# Patient Record
Sex: Female | Born: 1935 | Race: White | Hispanic: No | Smoking: Former smoker
Health system: Southern US, Community
[De-identification: ages and names within clinical notes are randomized; demographics above are authoritative.]

## PROBLEM LIST (undated history)

## (undated) DIAGNOSIS — Z9289 Personal history of other medical treatment: Secondary | ICD-10-CM

## (undated) DIAGNOSIS — I4891 Unspecified atrial fibrillation: Secondary | ICD-10-CM

## (undated) DIAGNOSIS — E538 Deficiency of other specified B group vitamins: Secondary | ICD-10-CM

## (undated) DIAGNOSIS — M810 Age-related osteoporosis without current pathological fracture: Secondary | ICD-10-CM

## (undated) DIAGNOSIS — I739 Peripheral vascular disease, unspecified: Secondary | ICD-10-CM

## (undated) DIAGNOSIS — H269 Unspecified cataract: Secondary | ICD-10-CM

## (undated) DIAGNOSIS — K649 Unspecified hemorrhoids: Secondary | ICD-10-CM

## (undated) DIAGNOSIS — I10 Essential (primary) hypertension: Secondary | ICD-10-CM

## (undated) DIAGNOSIS — I5022 Chronic systolic (congestive) heart failure: Secondary | ICD-10-CM

## (undated) DIAGNOSIS — I340 Nonrheumatic mitral (valve) insufficiency: Secondary | ICD-10-CM

## (undated) DIAGNOSIS — I255 Ischemic cardiomyopathy: Secondary | ICD-10-CM

## (undated) DIAGNOSIS — I509 Heart failure, unspecified: Secondary | ICD-10-CM

## (undated) DIAGNOSIS — Q2730 Arteriovenous malformation, site unspecified: Secondary | ICD-10-CM

## (undated) DIAGNOSIS — I2510 Atherosclerotic heart disease of native coronary artery without angina pectoris: Secondary | ICD-10-CM

## (undated) DIAGNOSIS — E785 Hyperlipidemia, unspecified: Secondary | ICD-10-CM

## (undated) DIAGNOSIS — E119 Type 2 diabetes mellitus without complications: Secondary | ICD-10-CM

## (undated) DIAGNOSIS — F419 Anxiety disorder, unspecified: Secondary | ICD-10-CM

## (undated) HISTORY — PX: HEMORRHOID SURGERY: SHX153

## (undated) HISTORY — DX: Unspecified cataract: H26.9

## (undated) HISTORY — DX: Personal history of other medical treatment: Z92.89

## (undated) HISTORY — DX: Unspecified hemorrhoids: K64.9

## (undated) HISTORY — DX: Heart failure, unspecified: I50.9

## (undated) HISTORY — PX: ABDOMINAL HYSTERECTOMY: SHX81

## (undated) HISTORY — DX: Nonrheumatic mitral (valve) insufficiency: I34.0

## (undated) HISTORY — DX: Chronic systolic (congestive) heart failure: I50.22

## (undated) HISTORY — DX: Arteriovenous malformation, site unspecified: Q27.30

## (undated) HISTORY — PX: APPENDECTOMY: SHX54

## (undated) HISTORY — DX: Atherosclerotic heart disease of native coronary artery without angina pectoris: I25.10

## (undated) HISTORY — DX: Age-related osteoporosis without current pathological fracture: M81.0

## (undated) HISTORY — DX: Anxiety disorder, unspecified: F41.9

## (undated) HISTORY — PX: CORONARY ARTERY BYPASS GRAFT: SHX141

## (undated) HISTORY — DX: Ischemic cardiomyopathy: I25.5

## (undated) HISTORY — DX: Hyperlipidemia, unspecified: E78.5

## (undated) HISTORY — DX: Essential (primary) hypertension: I10

## (undated) HISTORY — DX: Unspecified atrial fibrillation: I48.91

## (undated) HISTORY — DX: Peripheral vascular disease, unspecified: I73.9

## (undated) HISTORY — DX: Deficiency of other specified B group vitamins: E53.8

## (undated) HISTORY — DX: Type 2 diabetes mellitus without complications: E11.9

---

## 2001-01-15 ENCOUNTER — Ambulatory Visit (HOSPITAL_COMMUNITY): Admission: RE | Admit: 2001-01-15 | Discharge: 2001-01-15 | Payer: Self-pay | Admitting: *Deleted

## 2002-12-28 ENCOUNTER — Encounter: Payer: Self-pay | Admitting: Endocrinology

## 2002-12-28 ENCOUNTER — Encounter: Admission: RE | Admit: 2002-12-28 | Discharge: 2002-12-28 | Payer: Self-pay | Admitting: Endocrinology

## 2003-01-13 ENCOUNTER — Encounter: Admission: RE | Admit: 2003-01-13 | Discharge: 2003-01-13 | Payer: Self-pay | Admitting: Endocrinology

## 2003-01-13 ENCOUNTER — Encounter: Payer: Self-pay | Admitting: Endocrinology

## 2003-01-26 ENCOUNTER — Encounter (INDEPENDENT_AMBULATORY_CARE_PROVIDER_SITE_OTHER): Payer: Self-pay | Admitting: Specialist

## 2003-01-26 ENCOUNTER — Ambulatory Visit (HOSPITAL_COMMUNITY): Admission: RE | Admit: 2003-01-26 | Discharge: 2003-01-26 | Payer: Self-pay | Admitting: *Deleted

## 2003-02-02 LAB — HM COLONOSCOPY: HM Colonoscopy: NORMAL

## 2003-03-03 ENCOUNTER — Ambulatory Visit (HOSPITAL_COMMUNITY): Admission: RE | Admit: 2003-03-03 | Discharge: 2003-03-03 | Payer: Self-pay | Admitting: *Deleted

## 2006-02-05 ENCOUNTER — Ambulatory Visit: Payer: Self-pay | Admitting: Family Medicine

## 2006-02-19 ENCOUNTER — Ambulatory Visit: Payer: Self-pay | Admitting: Family Medicine

## 2006-03-13 ENCOUNTER — Ambulatory Visit: Payer: Self-pay | Admitting: Internal Medicine

## 2006-03-27 ENCOUNTER — Ambulatory Visit: Payer: Self-pay | Admitting: Internal Medicine

## 2006-03-27 LAB — CONVERTED CEMR LAB
BUN: 17 mg/dL (ref 6–23)
Basophils Relative: 0.7 % (ref 0.0–1.0)
Creatinine, Ser: 1.1 mg/dL (ref 0.4–1.2)
Hemoglobin: 12.6 g/dL (ref 12.0–15.0)
Lymphocytes Relative: 20.7 % (ref 12.0–46.0)
MCHC: 33.6 g/dL (ref 30.0–36.0)
Monocytes Absolute: 0.7 10*3/uL (ref 0.2–0.7)
Monocytes Relative: 11.8 % — ABNORMAL HIGH (ref 3.0–11.0)
Neutro Abs: 4 10*3/uL (ref 1.4–7.7)

## 2006-05-16 ENCOUNTER — Ambulatory Visit: Payer: Self-pay | Admitting: Family Medicine

## 2007-01-08 ENCOUNTER — Encounter: Payer: Self-pay | Admitting: Internal Medicine

## 2007-02-02 ENCOUNTER — Ambulatory Visit: Payer: Self-pay | Admitting: Family Medicine

## 2007-02-02 DIAGNOSIS — E8941 Symptomatic postprocedural ovarian failure: Secondary | ICD-10-CM | POA: Insufficient documentation

## 2007-02-02 DIAGNOSIS — I10 Essential (primary) hypertension: Secondary | ICD-10-CM | POA: Insufficient documentation

## 2007-02-04 ENCOUNTER — Telehealth (INDEPENDENT_AMBULATORY_CARE_PROVIDER_SITE_OTHER): Payer: Self-pay | Admitting: *Deleted

## 2007-02-05 ENCOUNTER — Encounter: Payer: Self-pay | Admitting: Family Medicine

## 2007-02-09 ENCOUNTER — Ambulatory Visit: Payer: Self-pay | Admitting: Family Medicine

## 2007-02-10 ENCOUNTER — Encounter (INDEPENDENT_AMBULATORY_CARE_PROVIDER_SITE_OTHER): Payer: Self-pay | Admitting: *Deleted

## 2007-02-24 ENCOUNTER — Encounter: Payer: Self-pay | Admitting: Family Medicine

## 2007-03-05 ENCOUNTER — Encounter: Payer: Self-pay | Admitting: Family Medicine

## 2007-03-19 ENCOUNTER — Encounter (INDEPENDENT_AMBULATORY_CARE_PROVIDER_SITE_OTHER): Payer: Self-pay | Admitting: *Deleted

## 2007-04-01 ENCOUNTER — Telehealth (INDEPENDENT_AMBULATORY_CARE_PROVIDER_SITE_OTHER): Payer: Self-pay | Admitting: *Deleted

## 2007-04-13 ENCOUNTER — Telehealth (INDEPENDENT_AMBULATORY_CARE_PROVIDER_SITE_OTHER): Payer: Self-pay | Admitting: *Deleted

## 2007-05-14 ENCOUNTER — Encounter: Payer: Self-pay | Admitting: Family Medicine

## 2007-05-15 ENCOUNTER — Ambulatory Visit: Payer: Self-pay | Admitting: Family Medicine

## 2007-05-18 ENCOUNTER — Encounter (INDEPENDENT_AMBULATORY_CARE_PROVIDER_SITE_OTHER): Payer: Self-pay | Admitting: *Deleted

## 2007-05-18 DIAGNOSIS — Z8639 Personal history of other endocrine, nutritional and metabolic disease: Secondary | ICD-10-CM

## 2007-05-18 DIAGNOSIS — E785 Hyperlipidemia, unspecified: Secondary | ICD-10-CM

## 2007-05-18 DIAGNOSIS — Z862 Personal history of diseases of the blood and blood-forming organs and certain disorders involving the immune mechanism: Secondary | ICD-10-CM

## 2007-05-18 LAB — CONVERTED CEMR LAB
ALT: 22 units/L (ref 0–35)
Albumin: 3.8 g/dL (ref 3.5–5.2)
Alkaline Phosphatase: 52 units/L (ref 39–117)
BUN: 12 mg/dL (ref 6–23)
CO2: 27 meq/L (ref 19–32)
Calcium: 9 mg/dL (ref 8.4–10.5)
Creatinine, Ser: 1.2 mg/dL (ref 0.4–1.2)
Direct LDL: 133.1 mg/dL
GFR calc Af Amer: 56 mL/min
Total Bilirubin: 1.6 mg/dL — ABNORMAL HIGH (ref 0.3–1.2)
Total Protein: 6.8 g/dL (ref 6.0–8.3)
VLDL: 29 mg/dL (ref 0–40)

## 2007-06-23 ENCOUNTER — Encounter: Admission: RE | Admit: 2007-06-23 | Discharge: 2007-06-23 | Payer: Self-pay | Admitting: Family Medicine

## 2007-06-23 ENCOUNTER — Encounter: Payer: Self-pay | Admitting: Family Medicine

## 2007-11-10 ENCOUNTER — Telehealth (INDEPENDENT_AMBULATORY_CARE_PROVIDER_SITE_OTHER): Payer: Self-pay | Admitting: *Deleted

## 2008-03-10 ENCOUNTER — Ambulatory Visit: Payer: Self-pay | Admitting: Family Medicine

## 2008-03-11 ENCOUNTER — Encounter: Payer: Self-pay | Admitting: Family Medicine

## 2008-03-14 ENCOUNTER — Encounter (INDEPENDENT_AMBULATORY_CARE_PROVIDER_SITE_OTHER): Payer: Self-pay | Admitting: *Deleted

## 2008-03-17 ENCOUNTER — Encounter: Payer: Self-pay | Admitting: Family Medicine

## 2008-03-24 ENCOUNTER — Ambulatory Visit: Payer: Self-pay | Admitting: Family Medicine

## 2008-03-24 LAB — CONVERTED CEMR LAB
Bilirubin Urine: NEGATIVE
Glucose, Urine, Semiquant: NEGATIVE
Protein, U semiquant: NEGATIVE

## 2008-03-25 ENCOUNTER — Encounter: Payer: Self-pay | Admitting: Family Medicine

## 2008-03-28 ENCOUNTER — Encounter (INDEPENDENT_AMBULATORY_CARE_PROVIDER_SITE_OTHER): Payer: Self-pay | Admitting: *Deleted

## 2008-03-28 LAB — CONVERTED CEMR LAB
Bilirubin, Direct: 0.1 mg/dL (ref 0.0–0.3)
Calcium: 9.3 mg/dL (ref 8.4–10.5)
Direct LDL: 132.4 mg/dL
GFR calc Af Amer: 69 mL/min
HDL: 41.2 mg/dL (ref >39.0)
Sodium: 142 meq/L (ref 135–145)
Total Bilirubin: 1.1 mg/dL (ref 0.3–1.2)
Total CHOL/HDL Ratio: 5.1
Total Protein: 7.3 g/dL (ref 6.0–8.3)
Triglycerides: 140 mg/dL (ref 0–149)
VLDL: 28 mg/dL (ref 0–40)

## 2008-03-31 ENCOUNTER — Telehealth (INDEPENDENT_AMBULATORY_CARE_PROVIDER_SITE_OTHER): Payer: Self-pay | Admitting: *Deleted

## 2008-04-01 ENCOUNTER — Ambulatory Visit: Payer: Self-pay | Admitting: Family Medicine

## 2008-04-01 DIAGNOSIS — R300 Dysuria: Secondary | ICD-10-CM

## 2008-04-01 LAB — CONVERTED CEMR LAB
Blood in Urine, dipstick: NEGATIVE
Ketones, urine, test strip: NEGATIVE
Nitrite: NEGATIVE
Specific Gravity, Urine: 1.025
Urobilinogen, UA: NEGATIVE

## 2008-04-04 ENCOUNTER — Encounter (INDEPENDENT_AMBULATORY_CARE_PROVIDER_SITE_OTHER): Payer: Self-pay | Admitting: *Deleted

## 2008-04-07 ENCOUNTER — Encounter: Payer: Self-pay | Admitting: Family Medicine

## 2009-03-13 ENCOUNTER — Telehealth: Payer: Self-pay | Admitting: Family Medicine

## 2009-03-13 ENCOUNTER — Ambulatory Visit: Payer: Self-pay | Admitting: Family Medicine

## 2009-03-13 DIAGNOSIS — E1151 Type 2 diabetes mellitus with diabetic peripheral angiopathy without gangrene: Secondary | ICD-10-CM

## 2009-03-13 DIAGNOSIS — E1165 Type 2 diabetes mellitus with hyperglycemia: Secondary | ICD-10-CM

## 2009-03-13 DIAGNOSIS — F411 Generalized anxiety disorder: Secondary | ICD-10-CM | POA: Insufficient documentation

## 2009-03-14 ENCOUNTER — Telehealth (INDEPENDENT_AMBULATORY_CARE_PROVIDER_SITE_OTHER): Payer: Self-pay | Admitting: *Deleted

## 2009-03-14 ENCOUNTER — Telehealth: Payer: Self-pay | Admitting: Family Medicine

## 2009-03-19 ENCOUNTER — Emergency Department (HOSPITAL_COMMUNITY): Admission: EM | Admit: 2009-03-19 | Discharge: 2009-03-19 | Payer: Self-pay | Admitting: Emergency Medicine

## 2009-03-19 ENCOUNTER — Encounter: Payer: Self-pay | Admitting: Family Medicine

## 2009-03-20 ENCOUNTER — Encounter: Payer: Self-pay | Admitting: Family Medicine

## 2009-03-21 ENCOUNTER — Ambulatory Visit: Payer: Self-pay | Admitting: Family Medicine

## 2009-03-21 ENCOUNTER — Telehealth: Payer: Self-pay | Admitting: Family Medicine

## 2009-03-21 LAB — CONVERTED CEMR LAB
OCCULT 2: NEGATIVE
OCCULT 3: NEGATIVE

## 2009-03-28 ENCOUNTER — Ambulatory Visit: Payer: Self-pay | Admitting: Family Medicine

## 2009-03-28 DIAGNOSIS — R109 Unspecified abdominal pain: Secondary | ICD-10-CM

## 2009-03-28 LAB — CONVERTED CEMR LAB
Bilirubin Urine: NEGATIVE
Glucose, Urine, Semiquant: NEGATIVE
Ketones, urine, test strip: NEGATIVE
Specific Gravity, Urine: <1.005

## 2009-03-29 ENCOUNTER — Encounter: Payer: Self-pay | Admitting: Family Medicine

## 2009-03-29 ENCOUNTER — Telehealth (INDEPENDENT_AMBULATORY_CARE_PROVIDER_SITE_OTHER): Payer: Self-pay | Admitting: *Deleted

## 2009-03-29 ENCOUNTER — Ambulatory Visit: Payer: Self-pay | Admitting: Cardiology

## 2009-03-30 ENCOUNTER — Telehealth (INDEPENDENT_AMBULATORY_CARE_PROVIDER_SITE_OTHER): Payer: Self-pay | Admitting: *Deleted

## 2009-03-30 ENCOUNTER — Telehealth: Payer: Self-pay | Admitting: Family Medicine

## 2009-03-31 ENCOUNTER — Encounter (INDEPENDENT_AMBULATORY_CARE_PROVIDER_SITE_OTHER): Payer: Self-pay | Admitting: *Deleted

## 2009-04-03 ENCOUNTER — Encounter (INDEPENDENT_AMBULATORY_CARE_PROVIDER_SITE_OTHER): Payer: Self-pay | Admitting: *Deleted

## 2009-04-14 ENCOUNTER — Ambulatory Visit: Payer: Self-pay | Admitting: Internal Medicine

## 2009-04-14 DIAGNOSIS — R198 Other specified symptoms and signs involving the digestive system and abdomen: Secondary | ICD-10-CM

## 2009-04-14 DIAGNOSIS — I739 Peripheral vascular disease, unspecified: Secondary | ICD-10-CM

## 2009-04-14 DIAGNOSIS — Q2733 Arteriovenous malformation of digestive system vessel: Secondary | ICD-10-CM

## 2009-04-25 ENCOUNTER — Encounter: Payer: Self-pay | Admitting: Family Medicine

## 2009-04-26 ENCOUNTER — Encounter: Payer: Self-pay | Admitting: Family Medicine

## 2009-04-26 ENCOUNTER — Ambulatory Visit: Payer: Self-pay | Admitting: Vascular Surgery

## 2009-04-27 ENCOUNTER — Telehealth: Payer: Self-pay | Admitting: Family Medicine

## 2009-04-27 ENCOUNTER — Encounter: Payer: Self-pay | Admitting: Family Medicine

## 2009-04-28 ENCOUNTER — Telehealth (INDEPENDENT_AMBULATORY_CARE_PROVIDER_SITE_OTHER): Payer: Self-pay | Admitting: *Deleted

## 2009-05-15 ENCOUNTER — Telehealth: Payer: Self-pay | Admitting: Family Medicine

## 2009-06-28 ENCOUNTER — Encounter: Payer: Self-pay | Admitting: Family Medicine

## 2009-06-28 ENCOUNTER — Telehealth: Payer: Self-pay | Admitting: Family Medicine

## 2009-07-05 ENCOUNTER — Ambulatory Visit: Payer: Self-pay | Admitting: Family Medicine

## 2009-07-10 LAB — CONVERTED CEMR LAB
AST: 26 units/L (ref 0–37)
Albumin: 4 g/dL (ref 3.5–5.2)
Alkaline Phosphatase: 62 units/L (ref 39–117)
BUN: 18 mg/dL (ref 6–23)
Bilirubin, Direct: 0.2 mg/dL (ref 0.0–0.3)
CO2: 29 meq/L (ref 19–32)
Calcium: 9.6 mg/dL (ref 8.4–10.5)
Cholesterol: 108 mg/dL (ref 0–200)
Creatinine, Ser: 1 mg/dL (ref 0.4–1.2)
Hgb A1c MFr Bld: 6 % (ref 4.6–6.5)
Microalb, Ur: 0.5 mg/dL (ref 0.0–1.9)
Total Protein: 6.9 g/dL (ref 6.0–8.3)
Triglycerides: 58 mg/dL (ref 0.0–149.0)

## 2009-11-01 ENCOUNTER — Ambulatory Visit: Payer: Self-pay | Admitting: Vascular Surgery

## 2009-11-15 ENCOUNTER — Telehealth (INDEPENDENT_AMBULATORY_CARE_PROVIDER_SITE_OTHER): Payer: Self-pay | Admitting: *Deleted

## 2009-12-04 ENCOUNTER — Ambulatory Visit: Payer: Self-pay | Admitting: Family Medicine

## 2009-12-18 ENCOUNTER — Encounter: Payer: Self-pay | Admitting: Family Medicine

## 2010-01-02 ENCOUNTER — Ambulatory Visit: Payer: Self-pay | Admitting: Family Medicine

## 2010-01-02 DIAGNOSIS — M549 Dorsalgia, unspecified: Secondary | ICD-10-CM | POA: Insufficient documentation

## 2010-01-02 LAB — CONVERTED CEMR LAB
Bilirubin Urine: NEGATIVE
Blood in Urine, dipstick: NEGATIVE
Protein, U semiquant: NEGATIVE
Specific Gravity, Urine: 1.02
Urobilinogen, UA: 0.2

## 2010-01-08 LAB — CONVERTED CEMR LAB
Alkaline Phosphatase: 50 units/L (ref 39–117)
Bilirubin, Direct: 0.1 mg/dL (ref 0.0–0.3)
CO2: 26 meq/L (ref 19–32)
Calcium: 9.5 mg/dL (ref 8.4–10.5)
Creatinine, Ser: 1 mg/dL (ref 0.4–1.2)
Creatinine,U: 80.9 mg/dL
GFR calc non Af Amer: 58.34 mL/min (ref >60)
HDL: 47.5 mg/dL (ref >39.00)
Hgb A1c MFr Bld: 6 % (ref 4.6–6.5)
LDL Cholesterol: 78 mg/dL (ref 0–99)
Microalb Creat Ratio: 0.7 mg/g (ref 0.0–30.0)
Sodium: 145 meq/L (ref 135–145)
Total Bilirubin: 0.8 mg/dL (ref 0.3–1.2)
Total CHOL/HDL Ratio: 3
Total Protein: 6.6 g/dL (ref 6.0–8.3)
Triglycerides: 76 mg/dL (ref 0.0–149.0)

## 2010-01-12 ENCOUNTER — Telehealth (INDEPENDENT_AMBULATORY_CARE_PROVIDER_SITE_OTHER): Payer: Self-pay | Admitting: *Deleted

## 2010-01-17 ENCOUNTER — Telehealth: Payer: Self-pay | Admitting: Family Medicine

## 2010-03-16 ENCOUNTER — Encounter: Payer: Self-pay | Admitting: Family Medicine

## 2010-03-16 ENCOUNTER — Ambulatory Visit: Payer: Self-pay | Admitting: Family Medicine

## 2010-03-16 DIAGNOSIS — M810 Age-related osteoporosis without current pathological fracture: Secondary | ICD-10-CM | POA: Insufficient documentation

## 2010-03-16 LAB — CONVERTED CEMR LAB
Bilirubin Urine: NEGATIVE
Glucose, Urine, Semiquant: NEGATIVE
Ketones, urine, test strip: NEGATIVE
Specific Gravity, Urine: 1.02
Urobilinogen, UA: NEGATIVE

## 2010-03-20 ENCOUNTER — Ambulatory Visit: Payer: Self-pay | Admitting: Family Medicine

## 2010-03-20 ENCOUNTER — Encounter (INDEPENDENT_AMBULATORY_CARE_PROVIDER_SITE_OTHER): Payer: Self-pay | Admitting: *Deleted

## 2010-03-21 ENCOUNTER — Encounter: Payer: Self-pay | Admitting: Family Medicine

## 2010-03-21 ENCOUNTER — Telehealth (INDEPENDENT_AMBULATORY_CARE_PROVIDER_SITE_OTHER): Payer: Self-pay | Admitting: *Deleted

## 2010-03-21 LAB — CONVERTED CEMR LAB
ALT: 16 units/L (ref 0–35)
BUN: 19 mg/dL (ref 6–23)
Basophils Relative: 0.6 % (ref 0.0–3.0)
CO2: 27 meq/L (ref 19–32)
Chloride: 110 meq/L (ref 96–112)
Cholesterol: 135 mg/dL (ref 0–200)
Creatinine, Ser: 0.9 mg/dL (ref 0.4–1.2)
Creatinine,U: 121.4 mg/dL
Eosinophils Absolute: 0.2 10*3/uL (ref 0.0–0.7)
Eosinophils Relative: 2.7 % (ref 0.0–5.0)
HCT: 34.1 % — ABNORMAL LOW (ref 36.0–46.0)
Hgb A1c MFr Bld: 5.6 % (ref 4.6–6.5)
LDL Cholesterol: 70 mg/dL (ref 0–99)
Lymphs Abs: 2.6 10*3/uL (ref 0.7–4.0)
MCHC: 34.7 g/dL (ref 30.0–36.0)
MCV: 99.6 fL (ref 78.0–100.0)
Microalb, Ur: 2.2 mg/dL — ABNORMAL HIGH (ref 0.0–1.9)
Monocytes Absolute: 0.7 10*3/uL (ref 0.1–1.0)
Platelets: 164 10*3/uL (ref 150.0–400.0)
Potassium: 4.4 meq/L (ref 3.5–5.1)
RBC: 3.43 M/uL — ABNORMAL LOW (ref 3.87–5.11)
Total Protein: 6.7 g/dL (ref 6.0–8.3)
Triglycerides: 73 mg/dL (ref 0.0–149.0)
WBC: 6.3 10*3/uL (ref 4.5–10.5)

## 2010-03-23 ENCOUNTER — Telehealth (INDEPENDENT_AMBULATORY_CARE_PROVIDER_SITE_OTHER): Payer: Self-pay | Admitting: *Deleted

## 2010-03-26 ENCOUNTER — Encounter: Payer: Self-pay | Admitting: Family Medicine

## 2010-03-27 ENCOUNTER — Encounter: Payer: Self-pay | Admitting: Family Medicine

## 2010-04-30 ENCOUNTER — Encounter: Payer: Self-pay | Admitting: Family Medicine

## 2010-05-10 ENCOUNTER — Encounter: Payer: Self-pay | Admitting: Family Medicine

## 2010-05-10 ENCOUNTER — Ambulatory Visit: Payer: Self-pay | Admitting: Vascular Surgery

## 2010-06-06 ENCOUNTER — Telehealth (INDEPENDENT_AMBULATORY_CARE_PROVIDER_SITE_OTHER): Payer: Self-pay | Admitting: *Deleted

## 2010-06-21 ENCOUNTER — Ambulatory Visit
Admission: RE | Admit: 2010-06-21 | Discharge: 2010-06-21 | Payer: Self-pay | Source: Home / Self Care | Attending: Family Medicine | Admitting: Family Medicine

## 2010-06-21 ENCOUNTER — Other Ambulatory Visit: Payer: Self-pay | Admitting: Family Medicine

## 2010-06-21 DIAGNOSIS — E538 Deficiency of other specified B group vitamins: Secondary | ICD-10-CM | POA: Insufficient documentation

## 2010-06-21 LAB — CBC WITH DIFFERENTIAL/PLATELET
Basophils Relative: 0.6 % (ref 0.0–3.0)
Eosinophils Relative: 6.9 % — ABNORMAL HIGH (ref 0.0–5.0)
Hemoglobin: 11.6 g/dL — ABNORMAL LOW (ref 12.0–15.0)
Lymphocytes Relative: 39.4 % (ref 12.0–46.0)
Monocytes Relative: 12.4 % — ABNORMAL HIGH (ref 3.0–12.0)
Neutro Abs: 2.6 10*3/uL (ref 1.4–7.7)
RBC: 3.37 Mil/uL — ABNORMAL LOW (ref 3.87–5.11)

## 2010-06-21 LAB — LIPID PANEL
Cholesterol: 127 mg/dL (ref 0–200)
HDL: 49 mg/dL (ref >39.00)
LDL Cholesterol: 70 mg/dL (ref 0–99)
Triglycerides: 42 mg/dL (ref 0.0–149.0)
VLDL: 8.4 mg/dL (ref 0.0–40.0)

## 2010-06-21 LAB — VITAMIN B12: Vitamin B-12: 42 pg/mL — ABNORMAL LOW (ref 211–911)

## 2010-06-21 LAB — BASIC METABOLIC PANEL
BUN: 19 mg/dL (ref 6–23)
Calcium: 9.3 mg/dL (ref 8.4–10.5)
Creatinine, Ser: 0.9 mg/dL (ref 0.4–1.2)
GFR: 64.29 mL/min (ref >60.00)

## 2010-06-21 LAB — HEPATIC FUNCTION PANEL
ALT: 15 U/L (ref 0–35)
Bilirubin, Direct: 0.2 mg/dL (ref 0.0–0.3)
Total Bilirubin: 1 mg/dL (ref 0.3–1.2)

## 2010-06-21 LAB — FERRITIN: Ferritin: 23.3 ng/mL (ref 10.0–291.0)

## 2010-06-24 LAB — CONVERTED CEMR LAB
ALT: 19 units/L (ref 0–35)
AST: 20 units/L (ref 0–37)
AST: 22 units/L (ref 0–37)
Alkaline Phosphatase: 51 units/L (ref 39–117)
Basophils Absolute: 0 10*3/uL (ref 0.0–0.1)
Basophils Relative: 0.4 % (ref 0.0–1.0)
Bilirubin Urine: NEGATIVE
Bilirubin, Direct: 0.1 mg/dL (ref 0.0–0.3)
Bilirubin, Direct: 0.1 mg/dL (ref 0.0–0.3)
Blood in Urine, dipstick: NEGATIVE
CO2: 28 meq/L (ref 19–32)
Calcium: 9.2 mg/dL (ref 8.4–10.5)
Chloride: 110 meq/L (ref 96–112)
Cholesterol, target level: 200 mg/dL
Creatinine, Ser: 1 mg/dL (ref 0.4–1.2)
Direct LDL: 155.5 mg/dL
Eosinophils Relative: 2.5 % (ref 0.0–5.0)
GFR calc non Af Amer: 64.5 mL/min (ref >60)
Glucose, Bld: 123 mg/dL — ABNORMAL HIGH (ref 70–99)
Glucose, Bld: 143 mg/dL — ABNORMAL HIGH (ref 70–99)
Glucose, Urine, Semiquant: NEGATIVE
HCT: 38.1 % (ref 36.0–46.0)
HDL goal, serum: 40 mg/dL
HDL: 43.2 mg/dL (ref >39.00)
Hemoglobin: 13.4 g/dL (ref 12.0–15.0)
Lymphocytes Relative: 34.5 % (ref 12.0–46.0)
MCV: 96.8 fL (ref 78.0–100.0)
Monocytes Relative: 11.9 % (ref 3.0–12.0)
Neutro Abs: 3 10*3/uL (ref 1.4–7.7)
Neutrophils Relative %: 49.8 % (ref 43.0–77.0)
Neutrophils Relative %: 57.1 % (ref 43.0–77.0)
Nitrite: NEGATIVE
Protein, U semiquant: >=300
RBC: 3.94 M/uL (ref 3.87–5.11)
RDW: 12.2 % (ref 11.5–14.6)
RDW: 12.3 % (ref 11.5–14.6)
Sodium: 144 meq/L (ref 135–145)
Sodium: 145 meq/L (ref 135–145)
Specific Gravity, Urine: 1.02
Total Bilirubin: 1 mg/dL (ref 0.3–1.2)
Total Bilirubin: 1.3 mg/dL — ABNORMAL HIGH (ref 0.3–1.2)
Total CHOL/HDL Ratio: 5
Total CHOL/HDL Ratio: 5
Triglycerides: 107 mg/dL (ref 0–149)
Triglycerides: 136 mg/dL (ref 0.0–149.0)
VLDL: 21 mg/dL (ref 0–40)
WBC Urine, dipstick: NEGATIVE
WBC Urine, dipstick: NEGATIVE
WBC: 6.8 10*3/uL (ref 4.5–10.5)
pH: 5

## 2010-06-26 NOTE — Progress Notes (Signed)
Summary: needs appt bp check- lmom [phone]  Phone Note Outgoing Call   Call placed by: Army Fossa CMA,  November 15, 2009 10:34 AM Summary of Call: Needs OV to check BP. Army Fossa CMA  November 15, 2009 10:35 AM   Follow-up for Phone Call        lmom to schedule appt - mailed letter .Marland KitchenOkey Regal Spring  November 15, 2009 3:58 PM

## 2010-06-26 NOTE — Progress Notes (Signed)
Summary: refill  Phone Note Refill Request Message from:  Fax from Pharmacy on January 12, 2010 8:27 AM  Refills Requested: Medication #1:  LISINOPRIL 20 MG TABS 1 by mouth once daily [facility] - fax [phone]  Initial call taken by: Okey Regal Spring,  January 12, 2010 8:28 AM  Follow-up for Phone Call        pharmacy did not receive.    Prescriptions: LISINOPRIL 20 MG TABS (LISINOPRIL) 1 by mouth once daily  #30 Tablet x 1   Entered by:   Army Fossa CMA   Authorized by:   Loreen Freud DO   Signed by:   Army Fossa CMA on 01/12/2010   Method used:   Electronically to        [facility] (retail)       [address]       [place], [place]  [address]       Ph: [phone]       Fax: [phone]   RxID:   [id number]

## 2010-06-26 NOTE — Assessment & Plan Note (Signed)
Summary: back pain generates to side/cbs   Vital Signs:  Patient profile:   77 year old female Height:      61 inches Weight:      130 pounds BMI:     24.65 Temp:     97.9 degrees F oral BP sitting:   134 / 70  (left arm)  Vitals Entered By: Doristine Devoid CMA (January 02, 2010 9:15 AM) CC: back pain x1 wk    History of Present Illness: 77 yo woman here today for back pain.  sxs on R lower back, radiating around the front into the groin/hip.  pain w/ twisting.  sxs started 5 days ago.  pt fears it's her kidney- has been drinking lots of water and cranberry juice.  the week prior to pain took down all her curtains and washed, pressed, and re-hung them.  no numbness or weakness in the legs.  no dysuria, incontinence of bowel or bladder.  Problems Prior to Update: 1)  Back Pain  (ICD-724.5) 2)  Arteriovenous Malformation, Colon  (ICD-747.61) 3)  Pvd  (ICD-443.9) 4)  Change in Bowels  (ICD-787.99) 5)  Abdominal Pain Other Specified Site  (ICD-789.09) 6)  Diabetes Mellitus, Type II, Uncontrolled  (ICD-250.02) 7)  Anxiety State, Unspecified  (ICD-300.00) 8)  Dysuria  (ICD-788.1) 9)  Hyperlipidemia  (ICD-272.4) 10)  Glucose Intolerance, Hx of  (ICD-V12.2) 11)  Artificial Menopause  (ICD-627.4) 12)  Family History of Cervical Cancer  (ICD-V17.3) 13)  Hypertension  (ICD-401.9)  Current Medications (verified): 1)  Lisinopril 20 Mg Tabs (Lisinopril) .[name].. 1 By Mouth Once Daily 2)  Adult Aspirin Ec Low Strength 81 Mg  Tbec (Aspirin) .[name].. 1 By Mouth Once Daily 3)  Calcium 600/vitamin D 600-200 Mg-Unit  Tabs (Calcium Carbonate-Vitamin D) .[name].. 1 By Mouth Two Times A Day 4)  Mvi 5)  Fish Oil 1200 Mg Caps (Omega-3 Fatty Acids) .[name].. 1 By Mouth Once Daily 6)  Celexa 20 Mg Tabs (Citalopram Hydrobromide) .... 1/2 Tab By Mouth Daily. 7)  Januvia 100 Mg Tabs (Sitagliptin Phosphate) .[name].. 1 By Mouth Once Daily 8)  Zocor 40 Mg Tabs (Simvastatin) .[name].. 1 By Mouth Once Daily. 9)  One Touch Delica Lancets   Misc (Lancets) .... As Directed Two Times A Day 10)  Onetouch Test  Strp (Glucose Blood) .... As Directed. 11)  Ibuprofen 400 Mg Tabs (Ibuprofen) .[name].. 1 Tab By Mouth Daily Three Times A Day For 7 Days and Then As Needed.  Take W/ Food.  Allergies (verified): 1)  ! Sulfa 2)  ! Demerol  Review of Systems      See HPI  Physical Exam  General:  Well-developed,well-nourished,in no acute distress; alert,appropriate and cooperative throughout examination Msk:  + TTP over R lumbar area.  (-) SLR.  good flexion and extension.  no pain w/ internal/external rotation of R hip.  good flexion/extension. Pulses:  +2 DP/PT Extremities:  no C/C/E Neurologic:  strength normal in all extremities, sensation intact to light touch, and gait normal.     Impression & Recommendations:  Problem # 1:  BACK PAIN (ICD-724.5) Assessment New (-) UA.  pt w/ musculoskeletal pain over R lumbar spine.  start Ibuprofen and heat/ice for relief.  reviewed supportive care and red flags that should prompt return.  Pt expresses understanding and is in agreement w/ this plan. Her updated medication list for this problem includes:    Adult Aspirin Ec Low Strength 81 Mg Tbec (Aspirin) .[name]... 1 by mouth once daily  Ibuprofen 400 Mg Tabs (Ibuprofen) .[name]... 1 tab by mouth daily three times a day for 7 days and then as needed.  take w/ food.  Orders: Specimen Handling (47829) UA Dipstick w/o Micro (manual) (81002)  Complete Medication List: 1)  Lisinopril 20 Mg Tabs (Lisinopril) .[name].. 1 by mouth once daily 2)  Adult Aspirin Ec Low Strength 81 Mg Tbec (Aspirin) .[name].. 1 by mouth once daily 3)  Calcium 600/vitamin D 600-200 Mg-unit Tabs (Calcium carbonate-vitamin d) .[name].. 1 by mouth two times a day 4)  Mvi  5)  Fish Oil 1200 Mg Caps (Omega-3 fatty acids) .[name].. 1 by mouth once daily 6)  Celexa 20 Mg Tabs (Citalopram hydrobromide) .... 1/2 tab by mouth daily. 7)  Januvia 100 Mg Tabs (Sitagliptin phosphate) .[name].. 1 by mouth once  daily 8)  Zocor 40 Mg Tabs (Simvastatin) .[name].. 1 by mouth once daily. 9)  One Touch Delica Lancets Misc (Lancets) .... As directed two times a day 10)  Onetouch Test Strp (Glucose blood) .... As directed. 11)  Ibuprofen 400 Mg Tabs (Ibuprofen) .[name].. 1 tab by mouth daily three times a day for 7 days and then as needed.  take w/ food.  Other Orders: Venipuncture (56213)  Patient Instructions: 1)  This is not your kidneys- this is your lower back and hip 2)  Take the ibuprofen as directed- 1 pill three times a day for pain.  Take w/ food to avoid upset stomach 3)  Use a heating pad for pain relief 4)  If no improvement or at all worsening, please call 5)  Hang in there! Prescriptions: IBUPROFEN 400 MG TABS (IBUPROFEN) 1 tab by mouth daily three times a day for 7 days and then as needed.  take w/ food.  #60 x 0   Entered and Authorized by:   Neena Rhymes MD   Signed by:   Neena Rhymes MD on 01/02/2010   Method used:   Electronically to        [facility] (retail)       [address]       [place], [place]  [address]       Ph: [phone]       Fax: [phone]   RxID:   [id number]   Laboratory Results   Urine Tests    Routine Urinalysis   Glucose: negative   (Normal Range: Negative) Bilirubin: negative   (Normal Range: Negative) Ketone: negative   (Normal Range: Negative) Spec. Gravity: 1.020   (Normal Range: 1.003-1.035) Blood: negative   (Normal Range: Negative) pH: 6.0   (Normal Range: 5.0-8.0) Protein: negative   (Normal Range: Negative) Urobilinogen: 0.2   (Normal Range: 0-1) Nitrite: negative   (Normal Range: Negative) Leukocyte Esterace: negative   (Normal Range: Negative)

## 2010-06-26 NOTE — Progress Notes (Signed)
Summary: BP Log Brought by Patient  BP Log Brought by Patient   Imported By: Lanelle Bal 12/07/2009 13:17:17  _____________________________________________________________________  External Attachment:    Type:   Image     Comment:   External Document

## 2010-06-26 NOTE — Progress Notes (Signed)
Summary: lab result   Phone Note Outgoing Call Call back at Home Phone [phone]   Call placed by: [facility] CMA,  March 21, 2010 4:08 PM Details for Reason: Pt is slightly anemic----take mvi with iron daily-- DM and chol is well controlled---- con't meds and recheck 3 months----272.4  250.00  hgba1c, bmp, hep, lipid  285.9  cbcd, ibc, ferritin, b12 --low vita D---take vita D 3 2000u daily recheck 3 months Summary of Call: left message to call office..........................Marland KitchenFelecia Deloach CMA  March 21, 2010 4:08 PM  Discuss with patient, copy of labs mailed..............Marland KitchenFelecia Deloach CMA  March 22, 2010 9:40 AM

## 2010-06-26 NOTE — Progress Notes (Signed)
Summary: refill  Phone Note Refill Request Message from:  Fax from Pharmacy on March 23, 2010 11:36 AM  Refills Requested: Medication #1:  ONETOUCH TEST  STRP as directed. [facility] [address] - fax [phone]  Initial call taken by: Okey Regal Spring,  March 23, 2010 11:37 AM    duplicate request.... Almeta Monas CMA Duncan Dull)  March 23, 2010 11:49 AM

## 2010-06-26 NOTE — Assessment & Plan Note (Signed)
Summary: yearly check and fasting labs/cbs   Vital Signs:  Patient profile:   77 year old female Menstrual status:  hysterectomy Height:      60.5 inches Weight:      130.0 pounds BMI:     25.06 Temp:     98.7 degrees F oral Pulse rate:   88 / minute Pulse rhythm:   regular BP sitting:   136 / 80  (right arm) Cuff size:   regular  Vitals Entered By: Almeta Monas CMA Duncan Dull) (March 16, 2010 8:32 AM) CC: cpx/fasting  Does patient need assistance? Functional Status Self care, Cook/clean, Shopping, Social activities Ambulation Normal Comments pt is able to do adls on her own.  Pt can read and write.  Vision Screening:      Vision Comments: vision somewhat corrected with glasses---L cataract 40db HL: Left  Right  Audiometry Comment: grossly normal --6 ft whisper      Menstrual Status hysterectomy   History of Present Illness: Pt here for cpe and labs.  No pap.  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  Pt also seeing Dr Cathren Harsh,  optho--Dr Sherryle Lis  Type 1 diabetes mellitus follow-up      This is a 77 year old woman who presents with Type 2 diabetes mellitus follow-up.  The patient denies polyuria, polydipsia, blurred vision, self managed hypoglycemia, hypoglycemia requiring help, weight loss, weight gain, and numbness of extremities.  The patient denies the following symptoms: neuropathic pain, chest pain, vomiting, orthostatic symptoms, poor wound healing, intermittent claudication, vision loss, and foot ulcer.  Since the last visit the patient reports good dietary compliance, compliance with medications, exercising regularly, and monitoring blood glucose.  The patient has been measuring capillary blood glucose before breakfast and before dinner.  Since the last visit, the patient reports having had eye care by an ophthalmologist and no foot care.    Hyperlipidemia follow-up      The patient also presents for Hyperlipidemia follow-up.  The patient denies  muscle aches, GI upset, abdominal pain, flushing, itching, constipation, diarrhea, and fatigue.  The patient denies the following symptoms: chest pain/pressure, exercise intolerance, dypsnea, palpitations, syncope, and pedal edema.  Compliance with medications (by patient report) has been near 100%.  Dietary compliance has been good.  The patient reports exercising occasionally.  Adjunctive measures currently used by the patient include ASA.    Hypertension follow-up      The patient also presents for Hypertension follow-up.  The patient denies lightheadedness, urinary frequency, headaches, edema, impotence, rash, and fatigue.  The patient denies the following associated symptoms: chest pain, chest pressure, exercise intolerance, dyspnea, palpitations, syncope, leg edema, and pedal edema.  Compliance with medications (by patient report) has been near 100%.  The patient reports that dietary compliance has been good.  The patient reports exercising occasionally.  Adjunctive measures currently used by the patient include salt restriction.    Preventive Screening-Counseling & Management  Alcohol-Tobacco     Alcohol drinks/day: 0     Smoking Status: quit     Year Quit: 1986     Pack years: 30     Passive Smoke Exposure: no  Caffeine-Diet-Exercise     Caffeine use/day: 0     Does Patient Exercise: no  Hep-HIV-STD-Contraception     HIV Risk: no     Dental Visit-last 6 months no     Dental Care Counseling: pt has false teeth     SBE monthly: no     SBE Education/Counseling:  to perform regular SBE     Sun Exposure-Excessive: occasionally  Safety-Violence-Falls     Seat Belt Use: yes     Firearms in the Home: firearms in the home     Firearm Counseling: not indicated; uses recommended firearm safety measures     Smoke Detectors: yes     Smoke Detector Counseling: n/a     Violence in the Home: no risk noted     Violence Counseling: not applicable     Sexual Abuse: no     Sexual Abuse  Counseling: n/a     Fall Risk: no      Sexual History:  widow.    Current Medications (verified): 1)  Lisinopril 20 Mg Tabs (Lisinopril) .[name].. 1 By Mouth Once Daily 2)  Adult Aspirin Ec Low Strength 81 Mg  Tbec (Aspirin) .[name].. 1 By Mouth Once Daily 3)  Calcium 600/vitamin D 600-200 Mg-Unit  Tabs (Calcium Carbonate-Vitamin D) .[name].. 1 By Mouth Two Times A Day 4)  Mvi 5)  Fish Oil 1200 Mg Caps (Omega-3 Fatty Acids) .[name].. 1 By Mouth Once Daily 6)  Januvia 100 Mg Tabs (Sitagliptin Phosphate) .[name].. 1 By Mouth Once Daily 7)  Zocor 40 Mg Tabs (Simvastatin) .[name].. 1 By Mouth Once Daily. 8)  One Touch Delica Lancets  Misc (Lancets) .... As Directed Two Times A Day 9)  Onetouch Test  Strp (Glucose Blood) .... As Directed. 10)  Ibuprofen 400 Mg Tabs (Ibuprofen) .[name].. 1 Tab By Mouth Daily Three Times A Day For 7 Days and Then As Needed.  Take W/ Food.  Allergies (verified): 1)  ! Sulfa 2)  ! Demerol  Past History:  Past Medical History: Last updated: 04/14/2009 Hypertension Hyperlipidemia GLUCOSE INTOLERANCE, HX OF (ICD-V12.2) ARTIFICIAL MENOPAUSE (ICD-627.4) FAMILY HISTORY OF CERVICAL CANCER (ICD-V17.3)  Past Surgical History: Last updated: 04/14/2009 Hysterectomy--- Appendectomy Hemorrhoidectomy Colonoscopy ? 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 Family History of  Cervical cancer-- M Family History Hypertension Family History of Stroke M 1st degree relative <50 Renal CA-- Bx3  Family History Lung cancer-- B  Social History: Reviewed history from 04/14/2009 and no changes required. 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 Eyes:  Denies blurring, discharge, double vision, eye irritation, eye pain, halos, itching, light sensitivity, red eye, vision loss-1 eye, and vision loss-both eyes. ENT:  Denies decreased hearing, difficulty swallowing, ear discharge, earache, hoarseness, nasal congestion, nosebleeds, postnasal drainage, ringing in ears, sinus pressure, and sore throat. CV:  Denies bluish discoloration of lips or nails, chest pain or discomfort, difficulty breathing at night, difficulty breathing while lying down, fainting, fatigue, leg cramps with exertion, lightheadness, near fainting, palpitations, shortness of breath with exertion, swelling of feet, swelling of hands, and weight gain.  Resp:  Denies chest discomfort, chest pain with inspiration, cough, coughing up blood, excessive snoring, hypersomnolence, morning headaches, pleuritic, shortness of breath, sputum productive, and wheezing. GI:  Denies abdominal pain, bloody stools, change in bowel habits, constipation, dark tarry stools, diarrhea, excessive appetite, gas, hemorrhoids, indigestion, loss of appetite, nausea, vomiting, vomiting blood, and yellowish skin color. GU:  Denies abnormal vaginal bleeding, decreased libido, discharge, dysuria, genital sores, hematuria, incontinence, nocturia, urinary frequency, and urinary hesitancy. MS:  Denies joint pain, joint redness, joint swelling, loss of strength, low  back pain, mid back pain, muscle aches, muscle , cramps, muscle weakness, stiffness, and thoracic pain. Derm:  Denies changes in color of skin, changes in nail beds, dryness, excessive perspiration, flushing, hair loss, insect bite(s), itching, lesion(s), poor wound healing, and rash. Neuro:  Denies brief paralysis, difficulty with concentration, disturbances in coordination, falling down, headaches, inability to speak, memory loss, numbness, poor balance, seizures, sensation of room spinning, tingling, tremors, visual disturbances, and weakness. Psych:  Denies alternate hallucination ( auditory/visual), anxiety, depression, easily angered, easily tearful, irritability, mental problems, panic attacks, sense of great danger, suicidal thoughts/plans, thoughts of violence, unusual visions or sounds, and thoughts /plans of harming others. Endo:  Denies cold intolerance, excessive hunger, excessive thirst, excessive urination, heat intolerance, polyuria, and weight change. Heme:  Denies abnormal bruising, bleeding, enlarge lymph nodes, fevers, pallor, and skin discoloration. Allergy:  Denies hives or rash, itching eyes, persistent infections, seasonal allergies, and sneezing.  Physical Exam  General:  Well-developed,well-nourished,in no acute distress; alert,appropriate and cooperative throughout examination Head:  Normocephalic and atraumatic without obvious abnormalities. No apparent alopecia or balding. Eyes:  pupils equal, pupils round, pupils reactive to light, and no injection.   Ears:  External ear exam shows no significant lesions or deformities.  Otoscopic examination reveals clear canals, tympanic membranes are intact bilaterally without bulging, retraction, inflammation or discharge. Hearing is grossly normal bilaterally. Nose:  External nasal examination shows no deformity or inflammation. Nasal mucosa are pink and moist without lesions or exudates. Mouth:  Oral mucosa and oropharynx without  lesions or exudates.  Teeth in good repair. Neck:  No deformities, masses, or tenderness noted.no carotid bruits.   Chest Wall:  No deformities, masses, or tenderness noted. Breasts:  No mass, nodules, thickening, tenderness, bulging, retraction, inflamation, nipple discharge or skin changes noted.   Lungs:  Normal respiratory effort, chest expands symmetrically. Lungs are clear to auscultation, no crackles or wheezes. Heart:  normal rate and no murmur.   Abdomen:  Bowel sounds positive,abdomen soft and non-tender without masses, organomegaly or hernias noted. Msk:  normal ROM, no joint tenderness, no joint swelling, no joint warmth, no redness over joints, no joint deformities, no joint instability, and no crepitation.   Pulses:  R and L carotid,radial,femoral,dorsalis pedis and posterior tibial pulses are full and equal bilaterally Extremities:  No clubbing, cyanosis, edema, or deformity noted with normal full range of motion of all joints.   Neurologic:  No cranial nerve deficits noted. Station and gait are normal. Plantar reflexes are down-going bilaterally. DTRs are symmetrical throughout. Sensory, motor and coordinative functions appear intact. Skin:  Intact without suspicious lesions or rashes Cervical Nodes:  No lymphadenopathy noted Axillary Nodes:  No palpable lymphadenopathy Psych:  Cognition and judgment appear intact. Alert and cooperative with normal attention span and concentration. No apparent delusions, illusions, hallucinations   Impression & Recommendations:  Problem # 1:  PREVENTIVE HEALTH CARE (ICD-V70.0) GHM utd  Orders: Venipuncture (95621) TLB-Lipid Panel (80061-LIPID) TLB-BMP (Basic Metabolic  Panel-BMET) (80048-METABOL) TLB-CBC Platelet - w/Differential (85025-CBCD) TLB-Hepatic/Liver Function Pnl (80076-HEPATIC) TLB-A1C / Hgb A1C (Glycohemoglobin) (83036-A1C) TLB-Microalbumin/Creat Ratio, Urine (82043-MALB) T-Vitamin D (25-Hydroxy) (40981-19147) Specimen  Handling (82956) UA Dipstick W/ Micro (manual) (81000) Medicare -1st Annual Wellness Visit [phone]) EKG w/ Interpretation (93000)  Problem # 2:  OSTEOPOROSIS (ICD-733.00)  Her updated medication list for this problem includes:    Calcium 600/vitamin D 600-200 Mg-unit Tabs (Calcium carbonate-vitamin d) .[name]... 1 by mouth two times a day  Orders: T-Vitamin D (25-Hydroxy) [id number])  Bone Density: osteoporosis (04/25/2009)  Problem # 3:  DIABETES MELLITUS, TYPE II, UNCONTROLLED (ICD-250.02)  Her updated medication list for this problem includes:    Lisinopril 20 Mg Tabs (Lisinopril) .[name]... 1 by mouth once daily    Adult Aspirin Ec Low Strength 81 Mg Tbec (Aspirin) .[name]... 1 by mouth once daily    Januvia 100 Mg Tabs (Sitagliptin phosphate) .[name]... 1 by mouth once daily  Orders: Venipuncture (52841) TLB-Lipid Panel (80061-LIPID) TLB-BMP (Basic Metabolic Panel-BMET) (80048-METABOL) TLB-CBC Platelet - w/Differential (85025-CBCD) TLB-Hepatic/Liver Function Pnl (80076-HEPATIC) TLB-A1C / Hgb A1C (Glycohemoglobin) (83036-A1C) TLB-Microalbumin/Creat Ratio, Urine (82043-MALB) T-Vitamin D (25-Hydroxy) (32440-10272) Specimen Handling (53664) UA Dipstick W/ Micro (manual) (81000)  Labs Reviewed: Creat: 1.0 (01/02/2010)    Reviewed HgBA1c results: 6.0 (01/02/2010)  6.0 (07/05/2009)  Problem # 4:  HYPERLIPIDEMIA (ICD-272.4)  Her updated medication list for this problem includes:    Zocor 40 Mg Tabs (Simvastatin) .[name]... 1 by mouth once daily.  Orders: Venipuncture (40347) TLB-Lipid Panel (80061-LIPID) TLB-BMP (Basic Metabolic Panel-BMET) (80048-METABOL) TLB-CBC Platelet - w/Differential (85025-CBCD) TLB-Hepatic/Liver Function Pnl (80076-HEPATIC) TLB-A1C / Hgb A1C (Glycohemoglobin) (83036-A1C) TLB-Microalbumin/Creat Ratio, Urine (82043-MALB) T-Vitamin D (25-Hydroxy) (42595-63875) Specimen Handling (64332)  Labs Reviewed: SGOT: 17 (01/02/2010)   SGPT: 14 (01/02/2010)  Lipid  Goals: Chol Goal: 200 (03/10/2008)   HDL Goal: 40 (03/10/2008)   LDL Goal: 130 (03/10/2008)   TG Goal: 150 (03/10/2008)  Prior 10 Yr Risk Heart Disease: Not enough information (03/10/2008)   HDL:47.50 (01/02/2010), 40.90 (07/05/2009)  LDL:78 (01/02/2010), 56 (07/05/2009)  Chol:141 (01/02/2010), 108 (07/05/2009)  Trig:76.0 (01/02/2010), 58.0 (07/05/2009)  Problem # 5:  HYPERTENSION (ICD-401.9)  Her updated medication list for this problem includes:    Lisinopril 20 Mg Tabs (Lisinopril) .[name]... 1 by mouth once daily  Orders: Venipuncture (95188) TLB-Lipid Panel (80061-LIPID) TLB-BMP (Basic Metabolic Panel-BMET) (80048-METABOL) TLB-CBC Platelet - w/Differential (85025-CBCD) TLB-Hepatic/Liver Function Pnl (80076-HEPATIC) TLB-A1C / Hgb A1C (Glycohemoglobin) (83036-A1C) TLB-Microalbumin/Creat Ratio, Urine (82043-MALB) T-Vitamin D (25-Hydroxy) (41660-63016) Specimen Handling (01093)  BP today: 136/80 Prior BP: 134/70 (01/02/2010)  Prior 10 Yr Risk Heart Disease: Not enough information (03/10/2008)  Labs Reviewed: K+: 4.3 (01/02/2010) Creat: : 1.0 (01/02/2010)   Chol: 141 (01/02/2010)   HDL: 47.50 (01/02/2010)   LDL: 78 (01/02/2010)   TG: 76.0 (01/02/2010)  Complete Medication List: 1)  Lisinopril 20 Mg Tabs (Lisinopril) .[name].. 1 by mouth once daily 2)  Adult Aspirin Ec Low Strength 81 Mg Tbec (Aspirin) .[name].. 1 by mouth once daily 3)  Calcium 600/vitamin D 600-200 Mg-unit Tabs (Calcium carbonate-vitamin d) .[name].. 1 by mouth two times a day 4)  Mvi  5)  Fish Oil 1200 Mg Caps (Omega-3 fatty acids) .[name].. 1 by mouth once daily 6)  Januvia 100 Mg Tabs (Sitagliptin phosphate) .[name].. 1 by mouth once daily 7)  Zocor 40 Mg Tabs (Simvastatin) .[name].. 1 by mouth once daily. 8)  One Touch Delica Lancets Misc (Lancets) .... As directed two times a day 9)  Onetouch Test Strp (Glucose blood) .... As directed. 10)  Ibuprofen 400 Mg Tabs (Ibuprofen) .[name].. 1 tab by mouth daily three times a day for 7 days and then  as needed.  take w/ food.  Other Orders: Flu Vaccine 42yrs + MEDICARE PATIENTS (Z6109) Administration Flu vaccine - MCR (U0454) Flu Vaccine Consent Questions     Do you have a history of severe allergic reactions to this vaccine? no    Any prior history of allergic reactions to egg and/or gelatin? no    Do you have a sensitivity to the preservative Thimersol? no    Do you have a past history of Guillan-Barre Syndrome? no    Do you currently have an acute febrile illness? no    Have you ever had a severe reaction to latex? no    Vaccine information given and explained to patient? yes    Are you currently pregnant? no    Lot Number:[id number]   Exp Date:11/24/2010   Site Given  Left Deltoid IM  Orders Added: 1)  Flu Vaccine 22yrs + MEDICARE PATIENTS [Q2039] 2)  Administration Flu vaccine - MCR [G0008] 3)  Venipuncture [36415] 4)  TLB-Lipid Panel [80061-LIPID] 5)  TLB-BMP (Basic Metabolic Panel-BMET) [80048-METABOL] 6)  TLB-CBC Platelet - w/Differential [85025-CBCD] 7)  TLB-Hepatic/Liver Function Pnl [80076-HEPATIC] 8)  TLB-A1C / Hgb A1C (Glycohemoglobin) [83036-A1C] 9)  TLB-Microalbumin/Creat Ratio, Urine [82043-MALB] 10)  T-Vitamin D (25-Hydroxy) [09811-91478] 11)  Specimen Handling [99000] 12)  UA Dipstick W/ Micro (manual) [81000] 13)  Medicare -1st Annual Wellness Visit [G0438] 14)  EKG w/ Interpretation [93000] 15)  Est. Patient Level III [29562]   .[email or web address]  Last Flu Vaccine:  Fluvax 3+ (03/28/2009 2:36:01 PM) Flu Vaccine Result Date:  03/16/2010 Flu Vaccine Result:  given Flu Vaccine Next Due:  1 yr Pneumovax Result Date:  03/10/1997 Pneumovax Result:  given Pneumovax Next Due:  Not Indicated PAP Next Due:  Refused Last Mammogram:  Normal Bilateral (03/01/2006 9:19:26 AM) Mammogram Result Date:  03/08/2009 Mammogram Result:  normal Mammogram Next Due:  1 yr Bone Density Result Date:  04/25/2009 Bone Density Result:  osteoporosis Bone Density Next Due: 2  yr  Laboratory Results   Urine Tests   Date/Time Reported: March 16, 2010 10:26 AM   Routine Urinalysis   Color: yellow Appearance: Clear Glucose: negative   (Normal Range: Negative) Bilirubin: negative   (Normal Range: Negative) Ketone: negative   (Normal Range: Negative) Spec. Gravity: 1.020   (Normal Range: 1.003-1.035) Blood: negative   (Normal Range: Negative) pH: 5.0   (Normal Range: 5.0-8.0) Protein: negative   (Normal Range: Negative) Urobilinogen: negative   (Normal Range: 0-1) Nitrite: negative   (Normal Range: Negative) Leukocyte Esterace: negative   (Normal Range: Negative)    Comments: Floydene Flock  March 16, 2010 10:26 AM

## 2010-06-26 NOTE — Progress Notes (Signed)
Summary: CODEING FOR CPX  Phone Note From Other Clinic   Caller: INSURANCE COMPANY Call For: DR. Laury Axon Summary of Call: [employer] CALLED AND STATED THAT WHEN PATIENT COMES IN FOR HER CPX IN OCTOBER THAT IT NEEDS TO BE CODED AS AN ANNUAL WELLNESS EXAM SO HER INSURANCE WILL COVER IT.  Initial call taken by: Lavell Islam,  January 17, 2010 11:21 AM

## 2010-06-26 NOTE — Letter (Signed)
Summary: Letter Regarding Disease Mgmt Program/[facility]  Letter Regarding Disease Mgmt Program/[facility]   Imported By: Lanelle Bal 12/29/2009 09:45:15  _____________________________________________________________________  External Attachment:    Type:   Image     Comment:   External Document

## 2010-06-26 NOTE — Assessment & Plan Note (Signed)
Summary: rto bp -see  dr Courtney Paris   Vital Signs:  Patient profile:   77 year old female Height:      61 inches Weight:      133 pounds Temp:     99.1 degrees F oral Pulse rate:   89 / minute BP sitting:   160 / 90  (left arm)  Vitals Entered By: Jeremy Johann CMA (December 04, 2009 1:45 PM) CC: BP Comments REVIEWED MED LIST, PATIENT AGREED DOSE AND INSTRUCTION CORRECT    History of Present Illness: Pt here for bp check.  No complaints.     Current Medications (verified): 1)  Lisinopril 20 Mg Tabs (Lisinopril) .[name].. 1 By Mouth Once Daily 2)  Adult Aspirin Ec Low Strength 81 Mg  Tbec (Aspirin) .[name].. 1 By Mouth Once Daily 3)  Calcium 600/vitamin D 600-200 Mg-Unit  Tabs (Calcium Carbonate-Vitamin D) .[name].. 1 By Mouth Two Times A Day 4)  Mvi 5)  Fish Oil 1200 Mg Caps (Omega-3 Fatty Acids) .[name].. 1 By Mouth Once Daily 6)  Celexa 20 Mg Tabs (Citalopram Hydrobromide) .... 1/2 Tab By Mouth Daily. 7)  Januvia 100 Mg Tabs (Sitagliptin Phosphate) .[name].. 1 By Mouth Once Daily 8)  Zocor 40 Mg Tabs (Simvastatin) .[name].. 1 By Mouth Once Daily. 9)  One Touch Delica Lancets  Misc (Lancets) .... As Directed Two Times A Day 10)  Onetouch Test  Strp (Glucose Blood) .... As Directed.  Allergies: 1)  ! Sulfa 2)  ! Demerol  Past History:  Past medical, surgical, family and social histories (including risk factors) reviewed for relevance to current acute and chronic problems.  Past Medical History: Reviewed history from 04/14/2009 and no changes required. 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 Hysterectomy--- Appendectomy Hemorrhoidectomy Colonoscopy ? 2004, Dr Virginia Rochester  Family History: Reviewed history from 02/02/2007 and no changes required. Family History of Cervical cancer-- M Family History Hypertension Family  History of Stroke M 1st degree relative <50 Renal CA-- Bx3  Family History Lung cancer-- B  Social History: Reviewed history from 04/14/2009 and no changes required. Married--widow Former Smoker Alcohol use-no Drug use-no Regular exercise-no Retired from [employer] facility was closed)  Review of Systems      See HPI  Physical Exam  General:  Well-developed,well-nourished,in no acute distress; alert,appropriate and cooperative throughout examination Lungs:  Normal respiratory effort, chest expands symmetrically. Lungs are clear to auscultation, no crackles or wheezes. Heart:  normal rate and no murmur.   Psych:  Oriented X3 and normally interactive.     Impression & Recommendations:  Problem # 1:  HYPERTENSION (ICD-401.9)  + white coat syndrome -- see home bps scanned in The following medications were removed from the medication list:    Lasix 20 Mg Tabs (Furosemide) .[name]... 1 by mouth as needed for swelling Her updated medication list for this problem includes:    Lisinopril 20 Mg Tabs (Lisinopril) .[name]... 1 by mouth once daily  BP today: 160/90 Prior BP: 152/70 (04/14/2009)  Prior 10 Yr Risk Heart Disease: Not enough information (03/10/2008)  Labs Reviewed: K+: 4.3 (07/05/2009) Creat: : 1.0 (07/05/2009)   Chol: 108 (07/05/2009)   HDL: 40.90 (07/05/2009)   LDL: 56 (07/05/2009)   TG: 58.0 (07/05/2009)  Complete Medication List: 1)  Lisinopril 20 Mg Tabs (Lisinopril) .[name].. 1 by mouth once daily 2)  Adult Aspirin Ec Low Strength 81 Mg Tbec (Aspirin) .Marland KitchenMarland KitchenMarland Kitchen  1 by mouth once daily 3)  Calcium 600/vitamin D 600-200 Mg-unit Tabs (Calcium carbonate-vitamin d) .[name].. 1 by mouth two times a day 4)  Mvi  5)  Fish Oil 1200 Mg Caps (Omega-3 fatty acids) .[name].. 1 by mouth once daily 6)  Celexa 20 Mg Tabs (Citalopram hydrobromide) .... 1/2 tab by mouth daily. 7)  Januvia 100 Mg Tabs (Sitagliptin phosphate) .[name].. 1 by mouth once daily 8)  Zocor 40 Mg Tabs (Simvastatin) .[name].. 1 by  mouth once daily. 9)  One Touch Delica Lancets Misc (Lancets) .... As directed two times a day 10)  Onetouch Test Strp (Glucose blood) .... As directed.  Patient Instructions: 1)  rto physical

## 2010-06-26 NOTE — Progress Notes (Signed)
Summary: Prolia  Phone Note Outgoing Call   Summary of Call: The prolia is going a minimium of $165 for pt to do and she cannot afford this- is there something she can do? Army Fossa CMA  June 28, 2009 1:57 PM   Follow-up for Phone Call        try reclast Follow-up by: Loreen Freud DO,  June 28, 2009 2:03 PM  Additional Follow-up for Phone Call Additional follow up Details #1::        will send reclast info. Army Fossa CMA  June 28, 2009 2:05 PM.

## 2010-06-26 NOTE — Letter (Signed)
Summary: [facility]: Immunoassay Fecal Occult Blood (iFOB) Order Form  [facility] [place]  [address] [place], [place] [address]   Phone: [phone]  Fax: [phone]      [facility]: Immunoassay Fecal Occult Blood (iFOB) Order Form   March 20, 2010 MRN: [id number]   [facility] 04-12-1930   Physicican Name:______Dr.Lowne_____________  Diagnosis Code:_________V70.0___________      Doristine Devoid CMA

## 2010-06-26 NOTE — Medication Information (Signed)
Summary: Patient Assistance Form/Reclast  Patient Assistance Form/Reclast   Imported By: Lanelle Bal 08/17/2009 12:26:55  _____________________________________________________________________  External Attachment:    Type:   Image     Comment:   External Document

## 2010-06-28 NOTE — Letter (Signed)
Summary: [facility]  [facility]   Imported By: Lanelle Bal 05/25/2010 10:57:54  _____________________________________________________________________  External Attachment:    Type:   Image     Comment:   External Document

## 2010-06-28 NOTE — Letter (Signed)
Summary: [facility]  [facility]   Imported By: Lanelle Bal 05/08/2010 09:29:02  _____________________________________________________________________  External Attachment:    Type:   Image     Comment:   External Document

## 2010-06-28 NOTE — Progress Notes (Signed)
Summary: Declined Reclast  Phone Note Other Incoming   Caller: Fax--[facility] Reclast Summary of Call: I asked patient would she like to recieve the reclast Injection, she declined. Stated she did not want anything because it makes her sick. I advised I will document....Marland KitchenMarland KitchenInsurance info not verified..... Almeta Monas CMA Duncan Dull)  June 06, 2010 4:40 PM'

## 2010-07-26 ENCOUNTER — Other Ambulatory Visit: Payer: Self-pay | Admitting: Family Medicine

## 2010-07-26 ENCOUNTER — Encounter (INDEPENDENT_AMBULATORY_CARE_PROVIDER_SITE_OTHER): Payer: Self-pay | Admitting: *Deleted

## 2010-07-26 ENCOUNTER — Other Ambulatory Visit (INDEPENDENT_AMBULATORY_CARE_PROVIDER_SITE_OTHER): Payer: Medicare Other

## 2010-07-26 DIAGNOSIS — D649 Anemia, unspecified: Secondary | ICD-10-CM

## 2010-07-26 LAB — CBC WITH DIFFERENTIAL/PLATELET
Basophils Absolute: 0 10*3/uL (ref 0.0–0.1)
Basophils Relative: 0.5 % (ref 0.0–3.0)
Eosinophils Absolute: 0.4 10*3/uL (ref 0.0–0.7)
MCHC: 34.5 g/dL (ref 30.0–36.0)
MCV: 100.2 fl — ABNORMAL HIGH (ref 78.0–100.0)
Monocytes Absolute: 0.8 10*3/uL (ref 0.1–1.0)
Neutro Abs: 3.2 10*3/uL (ref 1.4–7.7)
Neutrophils Relative %: 43.4 % (ref 43.0–77.0)
RBC: 3.36 Mil/uL — ABNORMAL LOW (ref 3.87–5.11)
RDW: 13 % (ref 11.5–14.6)

## 2010-07-26 LAB — IBC PANEL: Transferrin: 255.9 mg/dL (ref 212.0–360.0)

## 2010-08-08 ENCOUNTER — Telehealth (INDEPENDENT_AMBULATORY_CARE_PROVIDER_SITE_OTHER): Payer: Self-pay | Admitting: *Deleted

## 2010-08-14 NOTE — Progress Notes (Signed)
Summary: need order and code for lab = 4/16---added  Phone Note Call from Patient   Caller: Patient Summary of Call: Patient has lab appt for September 15, 2022 (come back in one month) to check for anemia----what order and code should I use???    thanks Initial call taken by: Jerolyn Shin,  August 08, 2010 10:06 AM  Follow-up for Phone Call        recheck  1 month----285.9  cbcd, ferritin, ibc, b12 Follow-up by: Almeta Monas CMA Duncan Dull),  August 08, 2010 10:18 AM  Additional Follow-up for Phone Call Additional follow up Details #1::        added codes to 2022-09-15 lab Additional Follow-up by: Jerolyn Shin,  August 10, 2010 2:19 PM

## 2010-08-30 LAB — COMPREHENSIVE METABOLIC PANEL
AST: 29 U/L (ref 0–37)
BUN: 15 mg/dL (ref 6–23)
CO2: 24 mEq/L (ref 19–32)
Chloride: 108 mEq/L (ref 96–112)
Creatinine, Ser: 1.05 mg/dL (ref 0.4–1.2)
GFR calc Af Amer: >60 mL/min (ref >60)
GFR calc non Af Amer: 51 mL/min — ABNORMAL LOW (ref >60)
Glucose, Bld: 161 mg/dL — ABNORMAL HIGH (ref 70–99)
Total Bilirubin: 1.4 mg/dL — ABNORMAL HIGH (ref 0.3–1.2)

## 2010-08-30 LAB — URINALYSIS, ROUTINE W REFLEX MICROSCOPIC
Glucose, UA: 100 mg/dL — AB
pH: 5 (ref 5.0–8.0)

## 2010-08-30 LAB — CBC
HCT: 39.4 % (ref 36.0–46.0)
Hemoglobin: 13.5 g/dL (ref 12.0–15.0)
MCHC: 34.4 g/dL (ref 30.0–36.0)
MCV: 97.4 fL (ref 78.0–100.0)
RBC: 4.04 MIL/uL (ref 3.87–5.11)
WBC: 7 10*3/uL (ref 4.0–10.5)

## 2010-08-30 LAB — DIFFERENTIAL
Basophils Absolute: 0 10*3/uL (ref 0.0–0.1)
Eosinophils Relative: 0 % (ref 0–5)
Lymphocytes Relative: 30 % (ref 12–46)
Neutrophils Relative %: 58 % (ref 43–77)

## 2010-08-30 LAB — LIPASE, BLOOD: Lipase: 43 U/L (ref 11–59)

## 2010-09-05 ENCOUNTER — Other Ambulatory Visit: Payer: Self-pay | Admitting: Family Medicine

## 2010-09-10 ENCOUNTER — Other Ambulatory Visit (INDEPENDENT_AMBULATORY_CARE_PROVIDER_SITE_OTHER): Payer: Medicare Other

## 2010-09-10 DIAGNOSIS — Z79899 Other long term (current) drug therapy: Secondary | ICD-10-CM

## 2010-09-10 DIAGNOSIS — D649 Anemia, unspecified: Secondary | ICD-10-CM

## 2010-09-10 LAB — FERRITIN: Ferritin: 19.7 ng/mL (ref 10.0–291.0)

## 2010-09-10 LAB — CBC WITH DIFFERENTIAL/PLATELET
Basophils Absolute: 0.1 10*3/uL (ref 0.0–0.1)
Hemoglobin: 11.7 g/dL — ABNORMAL LOW (ref 12.0–15.0)
Lymphocytes Relative: 37.8 % (ref 12.0–46.0)
Monocytes Relative: 10.3 % (ref 3.0–12.0)
Neutro Abs: 3.3 10*3/uL (ref 1.4–7.7)
Platelets: 178 10*3/uL (ref 150.0–400.0)
RDW: 13.1 % (ref 11.5–14.6)
WBC: 7.1 10*3/uL (ref 4.5–10.5)

## 2010-09-10 LAB — IBC PANEL
Saturation Ratios: 34 % (ref 20.0–50.0)
Transferrin: 256.3 mg/dL (ref 212.0–360.0)

## 2010-09-11 NOTE — Patient Instructions (Signed)
Pt notified of lab results

## 2010-09-13 ENCOUNTER — Telehealth: Payer: Self-pay | Admitting: *Deleted

## 2010-09-13 NOTE — Telephone Encounter (Signed)
Spoke with patient she advised her of Dr.Lowne's recommendations and she voiced understanding     KP

## 2010-09-13 NOTE — Telephone Encounter (Signed)
She can just take otc iron 325mg   1 po qd

## 2010-09-13 NOTE — Telephone Encounter (Signed)
Pt states that slow Fe is being discontinue. Pt would like to know what she can take as a alternative to this med..Please advise

## 2010-10-09 NOTE — Assessment & Plan Note (Signed)
OFFICE VISIT   TURNER, KUNZMAN R  DOB:  29-Jul-1929                                       05/10/2010  ZOXWR#:[id number]   The patient returns for followup today.  She was last seen April 26, 2009, for an iliac artery occlusion.  She was asymptomatic at that time.  She continues to deny any symptoms of claudication currently.  She  states that she has been raking leaves 3 to 4 times per day and not  having any symptoms of claudication in her right lower extremity.  She  denies any rest pain.  She denies any nonhealing wounds.   CHRONIC MEDICAL PROBLEMS:  Diabetes, hypertension, elevated cholesterol.  These are all currently controlled.   REVIEW OF SYSTEMS:  She denies any symptoms of shortness of breath or  chest pain.   MEDICATIONS:  1. Lisinopril 20 mg once a day.  2. Lasix 20 mg once a day.  3. Aspirin 81 mg once a day.  4. Calcium.  5. Multivitamin.  6. Januvia 100 mg once a day.  7. Zocor 40 mg once a day.  8. Promethazine 25 mg 4 times a day p.r.n.   ALLERGIES:  She has allergies listed to Demerol and sulfa.   PHYSICAL EXAM:  Blood pressure 166/76 in the left arm, heart rate 79 and  regular.  Oxygen saturation is 97% on room air.  Upper extremities:  She  has 2+ radial pulses bilaterally.  Abdomen has no palpable mass, soft,  nontender.  Extremities:  She has a 2+ left femoral pulse, a 2+ of  popliteal pulse.  She has absent pedal pulses in the left foot and she  has absent femoral, popliteal, and pedal pulses in the right leg.  Both  feet are pink, warm, and well-perfused.  She had bilateral ABIs  performed today which were 0.50 on the right and 0.87 on the left.   In summary, the patient has a known right iliac occlusion.  She is  asymptomatic from this.  She has reasonable ABIs and she does not have  limb-threatening ischemia.  I believe the best option for her would be  continued conservative management with risk factor modification.   She  will return for repeat ABIs and office visit in 6 months' time.     Janetta Hora. Fields, MD  Electronically Signed   CEF/MEDQ  D:  05/11/2010  T:  05/11/2010  Job:  [id number]   cc:   Dr. Laury Axon

## 2010-10-09 NOTE — Assessment & Plan Note (Signed)
OFFICE VISIT   DYLIN, Emily Daniel  DOB:  07-12-1929                                       04/26/2009  ZOXWR#:[id number]   CHIEF COMPLAINT:  Blocked artery.   HISTORY OF PRESENT ILLNESS:  The patient is a 77 year old female  referred by Dr. Laury Axon for evaluation of an iliac artery occlusion.  The  patient denies any symptoms of claudication.  She is a former smoker but  quit in 1985.  Other atherosclerotic risk factors include diabetes and  hypertension.  These chronic medical problems are currently under  control and followed by Dr. Laury Axon.  She denies any rest pain.  Apparently she was noted to have an iliac artery occlusion on a recent  CT scan for abdominal pain.  The iliac artery occlusion was on the right  side.  The abdominal pain was in the left lower abdomen.  Her abdominal  pain has now completely resolved.   PAST SURGICAL HISTORY:  Appendectomy, hemorrhoidectomy, hysterectomy.   FAMILY HISTORY:  Unremarkable.   SOCIAL HISTORY:  She is widowed, has 2 children, former smoker, quit in  1985.  She does not consume alcohol regularly.   REVIEW OF SYSTEMS:  Full 12 point review of systems was performed with  the patient.  Please see intake referral form for details regarding  this.   Approximately 13 pages of medical records from Dr. Ernst Spell office were  reviewed today including the patient's medication list as well as her  recent workup for abdominal pain and recent office visits with Dr.  Laury Axon.   Of note, the patient is on aspirin therapy for antiplatelet effect.   PHYSICAL EXAM:  Vital signs:  Today blood pressure is 187/72 in the left  arm, heart rate is 85 and regular.  HEENT:  Unremarkable.  Neck:  Has 2+  carotid pulses without bruit.  Chest:  Clear to auscultation.  Cardiac:  Regular rate and rhythm without murmur.  Abdomen:  Soft, nontender,  nondistended, slightly obese.  Extremities:  She has no edema.  She has  2+ brachial and radial  pulses bilaterally.  She has a 2+ left femoral  and popliteal pulse.  She has absent right femoral and popliteal and  pedal pulses.  She has absent pedal pulses in the left foot.  Feet are  pink, warm and well-perfused bilaterally.  She has no ulcerations on the  feet.  Skin:  Has no ulcers or rashes.  Neurologic:  Exam shows  symmetric upper extremity and lower extremity motor strength which is  5/5.  Musculoskeletal:  Exam shows no significant major joint  deformities.   I reviewed her CT scan of the abdomen and pelvis films in detail.  This  shows a chronic long segment right common iliac artery occlusion.  CT  scan was otherwise fairly unremarkable.   She had bilateral ABIs performed today which were 0.57 on the right,  0.99 on the left.   In summary, the patient has a chronic right common iliac artery  occlusion which essentially is asymptomatic without signs or symptoms of  ischemia or claudication.  She has an ABI on the right side which  indicates reasonable perfusion, ABI on the left side is essentially  normal.  I believe the best management for this is continued risk factor  modification of her diabetes and hypertension as Dr.  Laury Axon is currently  doing as well as antiplatelet therapy with aspirin.  If she becomes  symptomatic with claudication type symptoms or rest pain in the future  we would consider an intervention at that point.  However, with her  smoking cessation she should be at low risk for this overall; her risk  of limb loss lifetime would be less than 5%.  She will follow up in six  months' time for repeat ABIs.  If they are fairly stable at that point  we may go to once yearly.   Emily Hora. Fields, MD  Electronically Signed   CEF/MEDQ  D:  04/28/2009  T:  04/28/2009  Job:  [id number]   cc:   Lelon Perla, DO

## 2010-10-11 ENCOUNTER — Other Ambulatory Visit: Payer: Self-pay | Admitting: *Deleted

## 2010-10-11 DIAGNOSIS — D649 Anemia, unspecified: Secondary | ICD-10-CM

## 2010-10-12 ENCOUNTER — Other Ambulatory Visit (INDEPENDENT_AMBULATORY_CARE_PROVIDER_SITE_OTHER): Payer: Medicare Other

## 2010-10-12 DIAGNOSIS — Z79899 Other long term (current) drug therapy: Secondary | ICD-10-CM

## 2010-10-12 DIAGNOSIS — D649 Anemia, unspecified: Secondary | ICD-10-CM

## 2010-10-12 DIAGNOSIS — Z0000 Encounter for general adult medical examination without abnormal findings: Secondary | ICD-10-CM

## 2010-10-12 LAB — CBC WITH DIFFERENTIAL/PLATELET
Basophils Absolute: 0 10*3/uL (ref 0.0–0.1)
Eosinophils Absolute: 0.3 10*3/uL (ref 0.0–0.7)
Lymphocytes Relative: 38.2 % (ref 12.0–46.0)
MCHC: 34.4 g/dL (ref 30.0–36.0)
Neutrophils Relative %: 46.3 % (ref 43.0–77.0)
RDW: 12.8 % (ref 11.5–14.6)

## 2010-10-12 LAB — IBC PANEL
Iron: 99 ug/dL (ref 42–145)
Transferrin: 236.6 mg/dL (ref 212.0–360.0)

## 2010-10-12 NOTE — Op Note (Signed)
   NAME:  Emily Daniel, Emily Daniel                         ACCOUNT NO.:  [id number]   MEDICAL RECORD NO.:  [id number]                   PATIENT TYPE:  AMB   LOCATION:  ENDO                                 FACILITY:  [facility]   PHYSICIAN:  Georgiana Spinner, M.D.                 DATE OF BIRTH:  06-28-1929   DATE OF PROCEDURE:  DATE OF DISCHARGE:                                 OPERATIVE REPORT   PROCEDURE:  Upper endoscopy.   INDICATIONS:  Gastrointestinal blood loss.   ANESTHESIA:  Fentanyl  40 mcg, Versed 3 mg.   DESCRIPTION OF PROCEDURE:  With the patient mildly sedated in the left  lateral decubitus position, the Olympus videoscopic endoscope was inserted  in the mouth and passed under direct vision through the esophagus, which  showed questionable stricture and this was photographed.  We entered into  the stomach.  The fundus and body appeared normal.  Antrum showed some minor  changes of erythema and erosion in the prepyloric area.  The duodenal bulb  and second portion of the duodenum appeared normal.  From this point, the  endoscope was slowly withdrawn taking circumferential views of the duodenal  mucosa until the endoscope then pulled back into the stomach, placed in  retroflexion and viewed the stomach from below.  The endoscope was then  straightened.  A biopsy was taken from the prepyloric area and then the  scope was withdrawn, taking circumferential views of the remaining gastric  and esophageal mucosa. The patient's vital signs and pulse oximetry remained  stable.  The patient tolerated the procedure well without apparent  complication.   FINDINGS:  Mild antritis/erosions.   PLAN:  Await biopsy report but may have to consider repeat colonoscopy in  this patient due to the GI blood loss presumed from iron-deficiency anemia.                                                Georgiana Spinner, M.D.    GMO/MEDQ  D:  01/26/2003  T:  01/26/2003  Job:  [id number]

## 2010-10-12 NOTE — Op Note (Signed)
   NAME:  Emily Daniel, Emily Daniel                         ACCOUNT NO.:  [id number]   MEDICAL RECORD NO.:  [id number]                   PATIENT TYPE:  AMB   LOCATION:  ENDO                                 FACILITY:  [facility]   PHYSICIAN:  Georgiana Spinner, M.D.                 DATE OF BIRTH:  1929/07/29   DATE OF PROCEDURE:  DATE OF DISCHARGE:                                 OPERATIVE REPORT   PROCEDURE:  Colonoscopy with eradication of bleeding site.   ENDOSCOPIST:  Georgiana Spinner, M.D.   ANESTHESIA:  Fentanyl 75, Versed 8 mg.   DESCRIPTION OF PROCEDURE:  With the patient mildly sedated in the left  lateral decubitus position, the Olympus videoscopic colonoscope was inserted  into the rectum and passed under direct vision into the cecum identified by,  ileocecal valve and appendiceal orifice, both of which were photographed.   From this point the colonoscope was slowly withdrawn taking circumferential  views of the colonic mucosa.  As we pulled back all the way to the rectum we  stopped first in the ascending colon approximately 2-3 folds removed from  the ileocecal valve where a very large AVM was seen on 1 fold.  It was  photographed and then using the Argon photocoagulator by Erbe we coagulated  this AVM.  There was no bleeding seen during this procedure; and, once  terminated, the colonoscope was removed, as described, to the rectum which  appeared normal on direct and retroflex view.  The endoscope was  straightened and withdrawn.  The patient's vital signs and pulse oximetry  remained stable.  The patient tolerated the procedure well without apparent  complications.   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   PLAN:  Await clinical response.  The patient will follow up with me as an  outpatient.                                               Georgiana Spinner, M.D.    GMO/MEDQ  D:  03/03/2003  T:  03/03/2003  Job:  [id number]

## 2010-10-12 NOTE — Assessment & Plan Note (Signed)
[facility]                          [place] [facility] OFFICE NOTE   NAME:Kring, LANDI BISCARDI                      MRN:          [id number]  DATE:03/27/2006                            DOB:          1929/12/13    Areebah Meinders was seen March 27, 2006 with a complaint of rectal bleeding.  Her abdominal exam and rectal exam were unremarkable.  She questioned a mass  effect in the perineum on the right side, but no lesion was palpable.  She  had no constitutional symptoms.  She had no organomegaly or lymphadenopathy.  White count was normal at 6200 and hematocrit was 37.5.   She is on Macrobid for an e. coli urinary tract infection.  She has an  organism which is uniformly resistant to all antibiotics except for trimeth-  sulfa and Macrobid.  She is allergic to sulfa and subjectively has had  nausea with nitrofurantoin but is tolerating the present course of medicine.   She was found to have e. coli at a walk-in clinic in August; at that time it  was sensitive to Cipro but is now resistant.  The only other antibiotic to  which it is sensitive would require parenteral therapy.   Because of the resistance of the organism and her intolerance or allergies,  I would recommend a urology consultation to rule out any predisposition to  her current urinary tract infection.  A copy of this note will be sent with  the consult request to facilitate continuity of care.   She is a primary patient of Dr. Laury Axon.     Titus Dubin. Alwyn Ren, MD,FACP,FCCP  Electronically Signed    WF[facility]/MedQ  DD: 03/29/2006  DT: 03/30/2006  Job #: [id number]

## 2010-10-12 NOTE — Procedures (Signed)
[facility]  Patient:    ICA, DAYE Visit Number: [id number] MRN: [id number]          Service Type: END Location: ENDO Attending Physician:  Sabino Gasser Proc. Date: 01/15/01 Adm. Date:  01/15/2001                             Procedure Report  PROCEDURE:  Colonoscopy.  INDICATIONS:  Rectal bleeding.  ANESTHESIA:  Demerol 75, Versed 5 mg.  DESCRIPTION OF PROCEDURE:  With the patient mildly sedated in the left lateral decubitus position, the Olympus videoscopic colonoscope was inserted in the rectum and passed under direct vision to the cecum, identified by the ileocecal valve and appendiceal orifice, both of which were photographed. From this point, the colonoscope was slowly withdrawn, taking circumferential views of the entire colonic mucosa stopping in the rectum which appeared normal on direct view and showed small internal hemorrhoids on retroflex view. The endoscope was straightened and withdrawn.  The patients vital signs and pulse oximeter remained stable.  The patient tolerated the procedure well without apparent complications.  FINDINGS:  Hemorrhoids, otherwise unremarkable examination.  PLAN:  Repeat exam in five years. Attending Physician:  Sabino Gasser DD:  01/15/01 TD:  01/16/01 Job: [id number]

## 2010-11-04 ENCOUNTER — Other Ambulatory Visit: Payer: Self-pay | Admitting: Family Medicine

## 2010-11-30 ENCOUNTER — Other Ambulatory Visit: Payer: Self-pay | Admitting: Family Medicine

## 2010-11-30 MED ORDER — SITAGLIPTIN PHOSPHATE 100 MG PO TABS: 100.0000 mg | ORAL_TABLET | Freq: Every day | ORAL | Status: DC

## 2010-11-30 NOTE — Telephone Encounter (Signed)
Rx Faxed    KP 

## 2010-12-05 ENCOUNTER — Other Ambulatory Visit: Payer: Self-pay | Admitting: Family Medicine

## 2010-12-11 ENCOUNTER — Other Ambulatory Visit: Payer: Self-pay | Admitting: Family Medicine

## 2010-12-11 DIAGNOSIS — D649 Anemia, unspecified: Secondary | ICD-10-CM

## 2010-12-12 ENCOUNTER — Other Ambulatory Visit (INDEPENDENT_AMBULATORY_CARE_PROVIDER_SITE_OTHER): Payer: Medicare Other

## 2010-12-12 DIAGNOSIS — D649 Anemia, unspecified: Secondary | ICD-10-CM

## 2010-12-12 DIAGNOSIS — Z79899 Other long term (current) drug therapy: Secondary | ICD-10-CM

## 2010-12-12 LAB — CBC WITH DIFFERENTIAL/PLATELET
Basophils Relative: 0.6 % (ref 0.0–3.0)
Eosinophils Absolute: 0.3 10*3/uL (ref 0.0–0.7)
MCHC: 34.6 g/dL (ref 30.0–36.0)
MCV: 99.7 fl (ref 78.0–100.0)
Monocytes Absolute: 1 10*3/uL (ref 0.1–1.0)
Neutrophils Relative %: 46.6 % (ref 43.0–77.0)
Platelets: 175 10*3/uL (ref 150.0–400.0)

## 2010-12-12 NOTE — Progress Notes (Signed)
Labs only

## 2010-12-13 LAB — IBC PANEL: Iron: 113 ug/dL (ref 42–145)

## 2010-12-31 ENCOUNTER — Other Ambulatory Visit: Payer: Self-pay | Admitting: Family Medicine

## 2011-02-01 ENCOUNTER — Other Ambulatory Visit: Payer: Self-pay | Admitting: Family Medicine

## 2011-03-18 ENCOUNTER — Encounter: Payer: Medicare Other | Admitting: Family Medicine

## 2011-03-18 ENCOUNTER — Encounter: Payer: Self-pay | Admitting: Family Medicine

## 2011-03-20 ENCOUNTER — Ambulatory Visit (INDEPENDENT_AMBULATORY_CARE_PROVIDER_SITE_OTHER): Payer: Medicare Other | Admitting: Family Medicine

## 2011-03-20 ENCOUNTER — Encounter: Payer: Self-pay | Admitting: Family Medicine

## 2011-03-20 VITALS — BP 140/82 | HR 91 | Temp 98.6°F | Ht 61.0 in | Wt 131.8 lb

## 2011-03-20 DIAGNOSIS — IMO0001 Reserved for inherently not codable concepts without codable children: Secondary | ICD-10-CM

## 2011-03-20 DIAGNOSIS — M810 Age-related osteoporosis without current pathological fracture: Secondary | ICD-10-CM

## 2011-03-20 DIAGNOSIS — Z23 Encounter for immunization: Secondary | ICD-10-CM

## 2011-03-20 DIAGNOSIS — E785 Hyperlipidemia, unspecified: Secondary | ICD-10-CM

## 2011-03-20 DIAGNOSIS — Z0000 Encounter for general adult medical examination without abnormal findings: Secondary | ICD-10-CM

## 2011-03-20 DIAGNOSIS — E119 Type 2 diabetes mellitus without complications: Secondary | ICD-10-CM

## 2011-03-20 DIAGNOSIS — I10 Essential (primary) hypertension: Secondary | ICD-10-CM

## 2011-03-20 LAB — CBC WITH DIFFERENTIAL/PLATELET
Basophils Relative: 0.4 % (ref 0.0–3.0)
Eosinophils Absolute: 0.3 10*3/uL (ref 0.0–0.7)
HCT: 34.7 % — ABNORMAL LOW (ref 36.0–46.0)
Hemoglobin: 11.9 g/dL — ABNORMAL LOW (ref 12.0–15.0)
Lymphs Abs: 2.3 10*3/uL (ref 0.7–4.0)
MCHC: 34.2 g/dL (ref 30.0–36.0)
MCV: 100.9 fl — ABNORMAL HIGH (ref 78.0–100.0)
Monocytes Absolute: 0.8 10*3/uL (ref 0.1–1.0)
Neutro Abs: 3.7 10*3/uL (ref 1.4–7.7)
Neutrophils Relative %: 51.7 % (ref 43.0–77.0)
RBC: 3.44 Mil/uL — ABNORMAL LOW (ref 3.87–5.11)

## 2011-03-20 LAB — HEPATIC FUNCTION PANEL
ALT: 15 U/L (ref 0–35)
AST: 16 U/L (ref 0–37)
Albumin: 4.2 g/dL (ref 3.5–5.2)
Total Protein: 7.1 g/dL (ref 6.0–8.3)

## 2011-03-20 LAB — LIPID PANEL
Cholesterol: 129 mg/dL (ref 0–200)
Triglycerides: 82 mg/dL (ref 0.0–149.0)

## 2011-03-20 LAB — MICROALBUMIN / CREATININE URINE RATIO
Creatinine,U: 114.6 mg/dL
Microalb, Ur: 1 mg/dL (ref 0.0–1.9)

## 2011-03-20 LAB — BASIC METABOLIC PANEL
CO2: 26 mEq/L (ref 19–32)
Chloride: 111 mEq/L (ref 96–112)
Creatinine, Ser: 0.9 mg/dL (ref 0.4–1.2)
Potassium: 4.7 mEq/L (ref 3.5–5.1)

## 2011-03-20 MED ORDER — TETANUS-DIPHTH-ACELL PERTUSSIS 5-2.5-18.5 LF-MCG/0.5 IM SUSP: 0.5000 mL | Freq: Once | INTRAMUSCULAR | Status: DC

## 2011-03-20 NOTE — Progress Notes (Signed)
Subjective:    Emily Daniel is a 77 y.o. female who presents for Medicare Annual/Subsequent preventive examination.  Preventive Screening-Counseling & Management  Tobacco History  Smoking status  . Former Smoker -- 1.0 packs/day for 36 years  . Types: Cigarettes  . Quit date: 02/18/1984  Smokeless tobacco  . Not on file     Problems Prior to Visit 1.   Current Problems (verified) Patient Active Problem List  Diagnoses  . DIABETES MELLITUS, TYPE II, UNCONTROLLED  . HYPERLIPIDEMIA  . ANXIETY STATE, UNSPECIFIED  . HYPERTENSION  . PVD  . ARTIFICIAL MENOPAUSE  . BACK PAIN  . OSTEOPOROSIS  . ARTERIOVENOUS MALFORMATION, COLON  . CHANGE IN BOWELS  . DYSURIA  . ABDOMINAL PAIN OTHER SPECIFIED SITE  . GLUCOSE INTOLERANCE, HX OF  . B12 DEFICIENCY    Medications Prior to Visit Current Outpatient Prescriptions on File Prior to Visit  Medication Sig Dispense Refill  . aspirin 81 MG tablet Take 81 mg by mouth daily.        . Calcium Carbonate-Vitamin D (CALCIUM-VITAMIN D) 600-200 MG-UNIT CAPS Take by mouth 2 (two) times daily.        [name] glucose blood test strip 1 each by Other route as needed. Use as instructed       . ibuprofen (ADVIL,MOTRIN) 400 MG tablet Take 400 mg by mouth every 6 (six) hours as needed.        [name] JANUVIA 100 MG tablet TAKE ONE TABLET BY MOUTH ONE TIME DAILY  31 each  2  . Lancets MISC by Does not apply route.        [name] lisinopril (PRINIVIL,ZESTRIL) 20 MG tablet TAKE ONE TABLET BY MOUTH ONE TIME DAILY  30 tablet  4  . simvastatin (ZOCOR) 40 MG tablet TAKE ONE TABLET BY MOUTH ONE TIME DAILY  30 tablet  2    Current Medications (verified) Current Outpatient Prescriptions  Medication Sig Dispense Refill  . aspirin 81 MG tablet Take 81 mg by mouth daily.        . Calcium Carbonate-Vitamin D (CALCIUM-VITAMIN D) 600-200 MG-UNIT CAPS Take by mouth 2 (two) times daily.        [name] glucose blood test strip 1 each by Other route as needed. Use as instructed       .  ibuprofen (ADVIL,MOTRIN) 400 MG tablet Take 400 mg by mouth every 6 (six) hours as needed.        [name] JANUVIA 100 MG tablet TAKE ONE TABLET BY MOUTH ONE TIME DAILY  31 each  2  . Lancets MISC by Does not apply route.        [name] lisinopril (PRINIVIL,ZESTRIL) 20 MG tablet TAKE ONE TABLET BY MOUTH ONE TIME DAILY  30 tablet  4  . Multiple Vitamin (MULTIVITAMIN) tablet Take 1 tablet by mouth daily.        . simvastatin (ZOCOR) 40 MG tablet TAKE ONE TABLET BY MOUTH ONE TIME DAILY  30 tablet  2  . vitamin B-12 (CYANOCOBALAMIN) 1000 MCG tablet Take 1,000 mcg by mouth daily.        . TDaP (BOOSTRIX) 5-2.5-18.5 LF-MCG/0.5 injection Inject 0.5 mLs into the muscle once.  0.5 mL  0     Allergies (verified) Meperidine hcl and Sulfonamide derivatives   PAST HISTORY  Family History Family History  Problem Relation Age of Onset  . Cancer Mother     cervical   . Cancer Brother     renal ...x3??  [name] Hypertension  Other   . Stroke Other     Social History History  Substance Use Topics  . Smoking status: Former Smoker -- 1.0 packs/day for 36 years    Types: Cigarettes    Quit date: 02/18/1984  . Smokeless tobacco: Not on file  . Alcohol Use: No     Are there smokers in your home (other than you)? No  Risk Factors Current exercise habits: The patient does not participate in regular exercise at present.  Dietary issues discussed: na   Cardiac risk factors: advanced age (older than 77 for men, 62 for women), dyslipidemia, hypertension and sedentary lifestyle.  Depression Screen (Note: if answer to either of the following is "Yes", a more complete depression screening is indicated)   Over the past two weeks, have you felt down, depressed or hopeless? No  Over the past two weeks, have you felt little interest or pleasure in doing things? No  Have you lost interest or pleasure in daily life? No  Do you often feel hopeless? No  Do you cry easily over simple problems? No  Activities of Daily  Living In your present state of health, do you have any difficulty performing the following activities?:  Driving? no Managing money?  No Feeding yourself? No Getting from bed to chair? No{exam Climbing a flight of stairs? No Preparing food and eating?: No Bathing or showering? No Getting dressed: No Getting to the toilet? No Using the toilet:No Moving around from place to place: No In the past year have you fallen or had a near fall?:No   Are you sexually active?  No  Do you have more than one partner?  No  Hearing Difficulties: No Do you often ask people to speak up or repeat themselves? No Do you experience ringing or noises in your ears? No Do you have difficulty understanding soft or whispered voices? No   Do you feel that you have a problem with memory? No  Do you often misplace items? No  Do you feel safe at home?  Yes  Cognitive Testing  Alert? Yes  Normal Appearance?Yes  Oriented to person? Yes  Place? Yes   Time? Yes  Recall of three objects?  Yes  Can perform simple calculations? Yes  Displays appropriate judgment?Yes  Can read the correct time from a watch face?Yes   Advanced Directives have been discussed with the patient? Yes  List the Names of Other Physician/Practitioners you currently use: 1.  optho--[place]  Indicate any recent Medical Services you may have received from other than [facility] providers in the past year (date may be approximate).  Immunization History  Administered Date(s) Administered  . Influenza Whole 03/04/2008, 03/28/2009, 03/16/2010  . Pneumococcal Polysaccharide 03/10/1997  . Zoster 05/14/2007    Screening Tests Health Maintenance  Topic Date Due  . Mammogram  01/09/1950  . Tetanus/tdap  01/10/1951  . Influenza Vaccine  02/25/2011  . Colonoscopy  02/01/2013  . Pneumococcal Polysaccharide Vaccine Age 39 And Over  Completed  . Zostavax  Completed    All answers were reviewed with the patient and necessary referrals were  made:  Loreen Freud, DO   03/20/2011   History reviewed: allergies, current medications, past family history, past medical history, past social history, past surgical history and problem list  Review of Systems  Review of Systems  Constitutional: Negative for activity change, appetite change and fatigue.  HENT: Negative for hearing loss, congestion, tinnitus and ear discharge.   Eyes: Negative for visual disturbance (see optho  q1y -- vision corrected to 20/20 with glasses).  Respiratory: Negative for cough, chest tightness and shortness of breath.   Cardiovascular: Negative for chest pain, palpitations and leg swelling.  Gastrointestinal: Negative for abdominal pain, diarrhea, constipation and abdominal distention.  Genitourinary: Negative for urgency, frequency, decreased urine volume and difficulty urinating.  Musculoskeletal: Negative for back pain, arthralgias and gait problem.  Skin: Negative for color change, pallor and rash.  Neurological: Negative for dizziness, light-headedness, numbness and headaches.  Hematological: Negative for adenopathy. Does not bruise/bleed easily.  Psychiatric/Behavioral: Negative for suicidal ideas, confusion, sleep disturbance, self-injury, dysphoric mood, decreased concentration and agitation.  Pt is able to read and write and can do all ADLs No risk for falling No abuse/ violence in home      Objective:     Vision by Snellen chart: optho  Body mass index is 24.90 kg/(m^2). BP 140/82  Pulse 91  Temp(Src) 98.6 F (37 C) (Oral)  Ht 5\' 1"  (1.549 m)  Wt 131 lb 12.8 oz (59.784 kg)  BMI 24.90 kg/m2  SpO2 95%  BP 140/82  Pulse 91  Temp(Src) 98.6 F (37 C) (Oral)  Ht 5\' 1"  (1.549 m)  Wt 131 lb 12.8 oz (59.784 kg)  BMI 24.90 kg/m2  SpO2 95% General appearance: alert, cooperative, appears stated age and no distress Head: Normocephalic, without obvious abnormality, atraumatic Eyes: conjunctivae/corneas clear. PERRL, EOM's intact. Fundi  benign. Ears: normal TM's and external ear canals both ears Nose: Nares normal. Septum midline. Mucosa normal. No drainage or sinus tenderness. Throat: lips, mucosa, and tongue normal; teeth and gums normal Neck: no adenopathy, no carotid bruit, no JVD, supple, symmetrical, trachea midline and thyroid not enlarged, symmetric, no tenderness/mass/nodules Back: symmetric, no curvature. ROM normal. No CVA tenderness. Lungs: clear to auscultation bilaterally Breasts: normal appearance, no masses or tenderness Heart: regular rate and rhythm, S1, S2 normal, no murmur, click, rub or gallop Abdomen: soft, non-tender; bowel sounds normal; no masses,  no organomegaly Pelvic: not indicated; post-menopausal, no abnormal Pap smears in past Extremities: extremities normal, atraumatic, no cyanosis or edema Pulses: 2+ and symmetric Skin: Skin color, texture, turgor normal. No rashes or lesions Lymph nodes: Cervical, supraclavicular, and axillary nodes normal. Neurologic: Alert and oriented X 3, normal strength and tone. Normal symmetric reflexes. Normal coordination and gait Psych--no depression / anxiety     Assessment:     CPE DM HTN hyperlipidemia     Plan:     During the course of the visit the patient was educated and counseled about appropriate screening and preventive services including:    Pneumococcal vaccine   Influenza vaccine  Td vaccine  Screening mammography  Bone densitometry screening  Colorectal cancer screening  Diabetes screening  Glaucoma screening  Advanced directives: has an advanced directive - a copy HAS NOT been provided.  Diet review for nutrition referral? Yes ____  Not Indicated __x__   Patient Instructions (the written plan) was given to the patient.  Medicare Attestation I have personally reviewed: The patient's medical and social history Their use of alcohol, tobacco or illicit drugs Their current medications and supplements The patient's  functional ability including ADLs,fall risks, home safety risks, cognitive, and hearing and visual impairment Diet and physical activities Evidence for depression or mood disorders  The patient's weight, height, BMI, and visual acuity have been recorded in the chart.  I have made referrals, counseling, and provided education to the patient based on review of the above and I have provided the patient with  a written personalized care plan for preventive services.     Loreen Freud, DO   03/20/2011

## 2011-03-20 NOTE — Assessment & Plan Note (Signed)
con't meds  Check labs 

## 2011-03-20 NOTE — Assessment & Plan Note (Signed)
Check bmd con't vita d and calcium  

## 2011-03-20 NOTE — Patient Instructions (Signed)

## 2011-04-02 ENCOUNTER — Other Ambulatory Visit: Payer: Self-pay | Admitting: Family Medicine

## 2011-04-09 ENCOUNTER — Encounter: Payer: Self-pay | Admitting: Family Medicine

## 2011-04-09 ENCOUNTER — Ambulatory Visit (INDEPENDENT_AMBULATORY_CARE_PROVIDER_SITE_OTHER): Payer: Medicare Other | Admitting: Family Medicine

## 2011-04-09 VITALS — BP 160/74 | Temp 100.5°F | Wt 130.4 lb

## 2011-04-09 DIAGNOSIS — J40 Bronchitis, not specified as acute or chronic: Secondary | ICD-10-CM

## 2011-04-09 DIAGNOSIS — J329 Chronic sinusitis, unspecified: Secondary | ICD-10-CM

## 2011-04-09 MED ORDER — AMOXICILLIN-POT CLAVULANATE 875-125 MG PO TABS: ORAL_TABLET | ORAL | Status: AC

## 2011-04-09 NOTE — Progress Notes (Signed)
   Subjective:     Emily Daniel is a 77 y.o. female who presents for evaluation of sinus pain. Symptoms include: congestion, cough, fevers, nasal congestion, sinus pressure, sneezing and sore throat. Onset of symptoms was 2 days ago. Symptoms have been gradually worsening since that time. Past history is significant for no history of pneumonia or bronchitis. Patient is a non-smoker.  The following portions of the patient's history were reviewed and updated as appropriate: allergies, current medications, past family history, past medical history, past social history, past surgical history and problem list.  Review of Systems Pertinent items are noted in HPI.   Objective:    BP 160/74  Temp(Src) 100.5 F (38.1 C) (Oral)  Wt 130 lb 6.4 oz (59.149 kg) General appearance: alert, cooperative, appears stated age and no distress Ears: normal TM's and external ear canals both ears Nose: green and yellow discharge, moderate congestion, sinus tenderness bilateral Throat: lips, mucosa, and tongue normal; teeth and gums normal Neck: no adenopathy, no carotid bruit, no JVD, supple, symmetrical, trachea midline and thyroid not enlarged, symmetric, no tenderness/mass/nodules Lungs: rhonchi bilaterally and wheezes bilaterally Heart: regular rate and rhythm, S1, S2 normal, no murmur, click, rub or gallop Extremities: extremities normal, atraumatic, no cyanosis or edema    Assessment:    Acute bacterial sinusitis.   Bronchitis--- finish abx,  mucinex or mucinex Dm for cough   Plan:    Nasal steroids per medication orders. Augmentin per medication orders. Follow up in a few days or as needed. if no better

## 2011-04-09 NOTE — Patient Instructions (Signed)

## 2011-04-16 ENCOUNTER — Encounter: Payer: Self-pay | Admitting: Family Medicine

## 2011-04-16 ENCOUNTER — Ambulatory Visit (INDEPENDENT_AMBULATORY_CARE_PROVIDER_SITE_OTHER): Payer: Medicare Other | Admitting: Family Medicine

## 2011-04-16 ENCOUNTER — Ambulatory Visit (HOSPITAL_BASED_OUTPATIENT_CLINIC_OR_DEPARTMENT_OTHER)
Admission: RE | Admit: 2011-04-16 | Discharge: 2011-04-16 | Disposition: A | Discharge status: Home / Self Care | Payer: Medicare Other | Source: Ambulatory Visit | Attending: Family Medicine | Admitting: Family Medicine

## 2011-04-16 VITALS — BP 144/74 | HR 98 | Temp 99.6°F | Wt 131.2 lb

## 2011-04-16 DIAGNOSIS — X58XXXA Exposure to other specified factors, initial encounter: Secondary | ICD-10-CM

## 2011-04-16 DIAGNOSIS — M79609 Pain in unspecified limb: Secondary | ICD-10-CM | POA: Insufficient documentation

## 2011-04-16 DIAGNOSIS — M79676 Pain in unspecified toe(s): Secondary | ICD-10-CM

## 2011-04-16 DIAGNOSIS — M7989 Other specified soft tissue disorders: Secondary | ICD-10-CM

## 2011-04-16 DIAGNOSIS — Z23 Encounter for immunization: Secondary | ICD-10-CM

## 2011-04-16 LAB — BASIC METABOLIC PANEL
BUN: 15 mg/dL (ref 6–23)
Chloride: 110 mEq/L (ref 96–112)
Creatinine, Ser: 0.9 mg/dL (ref 0.4–1.2)
GFR: 64.98 mL/min (ref >60.00)
Glucose, Bld: 111 mg/dL — ABNORMAL HIGH (ref 70–99)
Potassium: 3.5 mEq/L (ref 3.5–5.1)

## 2011-04-16 LAB — URIC ACID: Uric Acid, Serum: 5 mg/dL (ref 2.4–7.0)

## 2011-04-16 NOTE — Progress Notes (Signed)
  Subjective:    Emily Daniel is a 77 y.o. female who presents with left foot pain. Onset of the symptoms was several days ago. Precipitating event: stubbed her toe. Current symptoms include: ability to bear weight, but with some pain, redness and swelling. Aggravating factors: any weight bearing. Symptoms have been constant. Patient has had no prior foot problems. Evaluation to date: none. Treatment to date: none.  The following portions of the patient's history were reviewed and updated as appropriate: allergies, current medications, past family history, past medical history, past social history, past surgical history and problem list.  Review of Systems Pertinent items are noted in HPI.    Objective:    BP 144/74  Pulse 98  Temp(Src) 99.6 F (37.6 C) (Oral)  Wt 131 lb 3.2 oz (59.512 kg)  SpO2 98% Right foot:  normal exam, no swelling, tenderness, instability; ligaments intact, full range of motion of all ankle/foot joints  Left foot:  soft tissue swelling noted over the 1st metatarsal phl and tenderness of the 1st metatarsal head   Imaging: X-ray of the left foot: not available    Assessment:    Non-specific foot pain    Plan:  Suspect gout---check labs  Educational material distributed. Rest, ice, compression, and elevation (RICE) therapy. OTC analgesics as needed. post op shoe and ace

## 2011-04-16 NOTE — Patient Instructions (Signed)
Gout Gout is an inflammatory condition (arthritis) caused by a buildup of uric acid crystals in the joints. Uric acid is a chemical that is normally present in the blood. Under some circumstances, uric acid can form into crystals in your joints. This causes joint redness, soreness, and swelling (inflammation). Repeat attacks are common. Over time, uric acid crystals can form into masses (tophi) near a joint, causing disfigurement. Gout is treatable and often preventable. CAUSES  The disease begins with elevated levels of uric acid in the blood. Uric acid is produced by your body when it breaks down a naturally found substance called purines. This also happens when you eat certain foods such as meats and fish. Causes of an elevated uric acid level include:  Being passed down from parent to child (heredity).   Diseases that cause increased uric acid production (obesity, psoriasis, some cancers).   Excessive alcohol use.   Diet, especially diets rich in meat and seafood.   Medicines, including certain cancer-fighting drugs (chemotherapy), diuretics, and aspirin.   Chronic kidney disease. The kidneys are no longer able to remove uric acid well.   Problems with metabolism.  Conditions strongly associated with gout include:  Obesity.   High blood pressure.   High cholesterol.   Diabetes.  Not everyone with elevated uric acid levels gets gout. It is not understood why some people get gout and others do not. Surgery, joint injury, and eating too much of certain foods are some of the factors that can lead to gout. SYMPTOMS   An attack of gout comes on quickly. It causes intense pain with redness, swelling, and warmth in a joint.   Fever can occur.   Often, only one joint is involved. Certain joints are more commonly involved:   Base of the big toe.   Knee.   Ankle.   Wrist.   Finger.  Without treatment, an attack usually goes away in a few days to weeks. Between attacks, you  usually will not have symptoms, which is different from many other forms of arthritis. DIAGNOSIS  Your caregiver will suspect gout based on your symptoms and exam. Removal of fluid from the joint (arthrocentesis) is done to check for uric acid crystals. Your caregiver will give you a medicine that numbs the area (local anesthetic) and use a needle to remove joint fluid for exam. Gout is confirmed when uric acid crystals are seen in joint fluid, using a special microscope. Sometimes, blood, urine, and X-ray tests are also used. TREATMENT  There are 2 phases to gout treatment: treating the sudden onset (acute) attack and preventing attacks (prophylaxis). Treatment of an Acute Attack  Medicines are used. These include anti-inflammatory medicines or steroid medicines.   An injection of steroid medicine into the affected joint is sometimes necessary.   The painful joint is rested. Movement can worsen the arthritis.   You may use warm or cold treatments on painful joints, depending which works best for you.   Discuss the use of coffee, vitamin C, or cherries with your caregiver. These may be helpful treatment options.  Treatment to Prevent Attacks After the acute attack subsides, your caregiver may advise prophylactic medicine. These medicines either help your kidneys eliminate uric acid from your body or decrease your uric acid production. You may need to stay on these medicines for a very long time. The early phase of treatment with prophylactic medicine can be associated with an increase in acute gout attacks. For this reason, during the first few months   of treatment, your caregiver may also advise you to take medicines usually used for acute gout treatment. Be sure you understand your caregiver's directions. You should also discuss dietary treatment with your caregiver. Certain foods such as meats and fish can increase uric acid levels. Other foods such as dairy can decrease levels. Your caregiver  can give you a list of foods to avoid. HOME CARE INSTRUCTIONS   Do not take aspirin to relieve pain. This raises uric acid levels.   Only take over-the-counter or prescription medicines for pain, discomfort, or fever as directed by your caregiver.   Rest the joint as much as possible. When in bed, keep sheets and blankets off painful areas.   Keep the affected joint raised (elevated).   Use crutches if the painful joint is in your leg.   Drink enough water and fluids to keep your urine clear or pale yellow. This helps your body get rid of uric acid. Do not drink alcoholic beverages. They slow the passage of uric acid.   Follow your caregiver's dietary instructions. Pay careful attention to the amount of protein you eat. Your daily diet should emphasize fruits, vegetables, whole grains, and fat-free or low-fat milk products.   Maintain a healthy body weight.  SEEK MEDICAL CARE IF:   You have an oral temperature above 102 F (38.9 C).   You develop diarrhea, vomiting, or any side effects from medicines.   You do not feel better in 24 hours, or you are getting worse.  SEEK IMMEDIATE MEDICAL CARE IF:   Your joint becomes suddenly more tender and you have:   Chills.   An oral temperature above 102 F (38.9 C), not controlled by medicine.  MAKE SURE YOU:   Understand these instructions.   Will watch your condition.   Will get help right away if you are not doing well or get worse.  Document Released: 05/10/2000 Document Revised: 01/23/2011 Document Reviewed: 08/21/2009 [employer] Patient Information 2012 [employer]. 

## 2011-04-17 ENCOUNTER — Telehealth: Payer: Self-pay | Admitting: *Deleted

## 2011-04-17 ENCOUNTER — Ambulatory Visit: Payer: Medicare Other | Admitting: Family Medicine

## 2011-04-17 ENCOUNTER — Encounter: Payer: Self-pay | Admitting: *Deleted

## 2011-04-17 NOTE — Telephone Encounter (Signed)
letter mailed to patients home address with results. Per noted line busy for pt and work number is no longer working. Earlier note stated pt had been contacted.

## 2011-04-26 ENCOUNTER — Other Ambulatory Visit: Payer: Self-pay | Admitting: Family Medicine

## 2011-05-20 ENCOUNTER — Encounter: Payer: Self-pay | Admitting: Family Medicine

## 2011-06-17 ENCOUNTER — Telehealth: Payer: Self-pay

## 2011-06-17 NOTE — Telephone Encounter (Signed)
BMD results showed Borderline Osteoporosis: Per Dr.Lowne consider Fosamax 70mg  1 po weekly # 4 with 11 refills. Discussed with patient and she stated she was on Fosamax in the past and she does not want to take it because it damaged her gums so bad. She stated  At this time she does not want any medications and will continue to take the calcium and Vitamin D. I advised of fracture risk and patient voiced understanding and again declined, she stated she would be careful not to have any accidents and falls. I advised I would make Dr.Lowne aware.         KP

## 2011-06-17 NOTE — Telephone Encounter (Signed)
Spoke with patient and she

## 2011-06-18 ENCOUNTER — Other Ambulatory Visit: Payer: Self-pay | Admitting: Family Medicine

## 2011-06-18 NOTE — Telephone Encounter (Signed)
Faxed.   KP 

## 2011-07-25 ENCOUNTER — Other Ambulatory Visit: Payer: Self-pay | Admitting: Family Medicine

## 2011-09-20 ENCOUNTER — Other Ambulatory Visit: Payer: Self-pay | Admitting: Family Medicine

## 2011-09-27 ENCOUNTER — Ambulatory Visit (INDEPENDENT_AMBULATORY_CARE_PROVIDER_SITE_OTHER): Payer: Medicare Other | Admitting: Family Medicine

## 2011-09-27 ENCOUNTER — Encounter: Payer: Self-pay | Admitting: Family Medicine

## 2011-09-27 VITALS — BP 130/70 | HR 91 | Temp 98.5°F | Ht 60.75 in | Wt 127.8 lb

## 2011-09-27 DIAGNOSIS — I10 Essential (primary) hypertension: Secondary | ICD-10-CM

## 2011-09-27 DIAGNOSIS — E785 Hyperlipidemia, unspecified: Secondary | ICD-10-CM

## 2011-09-27 DIAGNOSIS — IMO0001 Reserved for inherently not codable concepts without codable children: Secondary | ICD-10-CM

## 2011-09-27 DIAGNOSIS — E119 Type 2 diabetes mellitus without complications: Secondary | ICD-10-CM

## 2011-09-27 LAB — POCT URINALYSIS DIPSTICK
Glucose, UA: NEGATIVE
Leukocytes, UA: NEGATIVE
Nitrite, UA: NEGATIVE
Urobilinogen, UA: 0.2

## 2011-09-27 LAB — LIPID PANEL
Cholesterol: 122 mg/dL (ref 0–200)
Triglycerides: 54 mg/dL (ref 0.0–149.0)

## 2011-09-27 LAB — BASIC METABOLIC PANEL
BUN: 18 mg/dL (ref 6–23)
Chloride: 108 mEq/L (ref 96–112)
GFR: 54.84 mL/min — ABNORMAL LOW (ref >60.00)
Glucose, Bld: 115 mg/dL — ABNORMAL HIGH (ref 70–99)
Potassium: 4.2 mEq/L (ref 3.5–5.1)
Sodium: 143 mEq/L (ref 135–145)

## 2011-09-27 LAB — HEPATIC FUNCTION PANEL
ALT: 16 U/L (ref 0–35)
AST: 18 U/L (ref 0–37)
Albumin: 4 g/dL (ref 3.5–5.2)
Alkaline Phosphatase: 52 U/L (ref 39–117)

## 2011-09-27 LAB — MICROALBUMIN / CREATININE URINE RATIO
Creatinine,U: 82.6 mg/dL
Microalb Creat Ratio: 1.1 mg/g (ref 0.0–30.0)

## 2011-09-27 NOTE — Progress Notes (Signed)
  Subjective:    Patient ID: Natale Milch, female    DOB: May 13, 1930, 78 y.o.   MRN: [id number]  HPI HYPERTENSION Disease Monitoring Blood pressure range-doesn't check at home Chest pain- no      Dyspnea- no Medications Compliance- good Lightheadedness- no   Edema- no   DIABETES Disease Monitoring Blood Sugar ranges-good per pt--no readings with her Polyuria- no New Visual problems- some blurry vision L eye--+ cataract Medications Compliance- good Hypoglycemic symptoms- no   HYPERLIPIDEMIA Disease Monitoring See symptoms for Hypertension Medications Compliance- good RUQ pain- no  Muscle aches- no  ROS See HPI above   PMH Smoking Status noted     Review of Systems    as above Objective:   Physical Exam  Constitutional: She is oriented to person, place, and time. She appears well-developed and well-nourished.  Cardiovascular: Normal rate, regular rhythm and normal heart sounds.   No murmur heard. Pulmonary/Chest: Effort normal and breath sounds normal.  Abdominal: Soft. Bowel sounds are normal.  Musculoskeletal: Normal range of motion. She exhibits no edema and no tenderness.  Neurological: She is alert and oriented to person, place, and time.  Psychiatric: She has a normal mood and affect. Her behavior is normal. Judgment and thought content normal.   Sensory exam of the foot is normal, tested with the monofilament. Good pulses, no lesions or ulcers, good peripheral pulses.       Assessment & Plan:

## 2011-09-27 NOTE — Assessment & Plan Note (Signed)
Stable con't meds 

## 2011-09-27 NOTE — Patient Instructions (Signed)

## 2011-09-27 NOTE — Assessment & Plan Note (Signed)
Check labs con't meds 

## 2011-10-14 ENCOUNTER — Other Ambulatory Visit: Payer: Self-pay | Admitting: Family Medicine

## 2011-12-13 ENCOUNTER — Other Ambulatory Visit: Payer: Self-pay | Admitting: Family Medicine

## 2012-02-13 ENCOUNTER — Other Ambulatory Visit: Payer: Self-pay | Admitting: Family Medicine

## 2012-03-12 ENCOUNTER — Other Ambulatory Visit: Payer: Self-pay | Admitting: Family Medicine

## 2012-03-13 ENCOUNTER — Other Ambulatory Visit: Payer: Self-pay | Admitting: Family Medicine

## 2012-03-13 NOTE — Telephone Encounter (Signed)
Letter mailed to schedule labs.      KP 

## 2012-03-31 ENCOUNTER — Encounter: Payer: Self-pay | Admitting: Family Medicine

## 2012-03-31 ENCOUNTER — Ambulatory Visit (INDEPENDENT_AMBULATORY_CARE_PROVIDER_SITE_OTHER): Payer: Medicare Other | Admitting: Family Medicine

## 2012-03-31 VITALS — BP 140/76 | HR 83 | Temp 98.9°F | Ht 60.5 in | Wt 133.4 lb

## 2012-03-31 DIAGNOSIS — Z0000 Encounter for general adult medical examination without abnormal findings: Secondary | ICD-10-CM

## 2012-03-31 DIAGNOSIS — E119 Type 2 diabetes mellitus without complications: Secondary | ICD-10-CM

## 2012-03-31 DIAGNOSIS — I10 Essential (primary) hypertension: Secondary | ICD-10-CM

## 2012-03-31 DIAGNOSIS — Z23 Encounter for immunization: Secondary | ICD-10-CM

## 2012-03-31 DIAGNOSIS — M810 Age-related osteoporosis without current pathological fracture: Secondary | ICD-10-CM

## 2012-03-31 DIAGNOSIS — E785 Hyperlipidemia, unspecified: Secondary | ICD-10-CM

## 2012-03-31 DIAGNOSIS — IMO0001 Reserved for inherently not codable concepts without codable children: Secondary | ICD-10-CM

## 2012-03-31 LAB — CBC WITH DIFFERENTIAL/PLATELET
Basophils Absolute: 0 10*3/uL (ref 0.0–0.1)
Eosinophils Absolute: 0.3 10*3/uL (ref 0.0–0.7)
HCT: 35.3 % — ABNORMAL LOW (ref 36.0–46.0)
Hemoglobin: 11.8 g/dL — ABNORMAL LOW (ref 12.0–15.0)
Lymphocytes Relative: 28.9 % (ref 12.0–46.0)
Lymphs Abs: 2.1 10*3/uL (ref 0.7–4.0)
MCHC: 33.4 g/dL (ref 30.0–36.0)
Neutro Abs: 4 10*3/uL (ref 1.4–7.7)
RDW: 13.2 % (ref 11.5–14.6)

## 2012-03-31 LAB — POCT URINALYSIS DIPSTICK
Bilirubin, UA: NEGATIVE
Ketones, UA: NEGATIVE
Leukocytes, UA: NEGATIVE
Protein, UA: NEGATIVE

## 2012-03-31 LAB — LIPID PANEL
Cholesterol: 140 mg/dL (ref 0–200)
HDL: 46 mg/dL (ref >39.00)
Triglycerides: 80 mg/dL (ref 0.0–149.0)

## 2012-03-31 LAB — BASIC METABOLIC PANEL
CO2: 25 mEq/L (ref 19–32)
Calcium: 9.3 mg/dL (ref 8.4–10.5)
Glucose, Bld: 122 mg/dL — ABNORMAL HIGH (ref 70–99)
Sodium: 143 mEq/L (ref 135–145)

## 2012-03-31 LAB — HEPATIC FUNCTION PANEL
Albumin: 4 g/dL (ref 3.5–5.2)
Total Protein: 7 g/dL (ref 6.0–8.3)

## 2012-03-31 NOTE — Assessment & Plan Note (Signed)
con't meds  Check labs 

## 2012-03-31 NOTE — Patient Instructions (Signed)
Preventive Care for Adults, Female A healthy lifestyle and preventive care can promote health and wellness. Preventive health guidelines for women include the following key practices.  A routine yearly physical is a good way to check with your caregiver about your health and preventive screening. It is a chance to share any concerns and updates on your health, and to receive a thorough exam.  Visit your dentist for a routine exam and preventive care every 6 months. Brush your teeth twice a day and floss once a day. Good oral hygiene prevents tooth decay and gum disease.  The frequency of eye exams is based on your age, health, family medical history, use of contact lenses, and other factors. Follow your caregiver's recommendations for frequency of eye exams.  Eat a healthy diet. Foods like vegetables, fruits, whole grains, low-fat dairy products, and lean protein foods contain the nutrients you need without too many calories. Decrease your intake of foods high in solid fats, added sugars, and salt. Eat the right amount of calories for you.Get information about a proper diet from your caregiver, if necessary.  Regular physical exercise is one of the most important things you can do for your health. Most adults should get at least 150 minutes of moderate-intensity exercise (any activity that increases your heart rate and causes you to sweat) each week. In addition, most adults need muscle-strengthening exercises on 2 or more days a week.  Maintain a healthy weight. The body mass index (BMI) is a screening tool to identify possible weight problems. It provides an estimate of body fat based on height and weight. Your caregiver can help determine your BMI, and can help you achieve or maintain a healthy weight.For adults 20 years and older:  A BMI below 18.5 is considered underweight.  A BMI of 18.5 to 24.9 is normal.  A BMI of 25 to 29.9 is considered overweight.  A BMI of 30 and above is  considered obese.  Maintain normal blood lipids and cholesterol levels by exercising and minimizing your intake of saturated fat. Eat a balanced diet with plenty of fruit and vegetables. Blood tests for lipids and cholesterol should begin at age 20 and be repeated every 5 years. If your lipid or cholesterol levels are high, you are over 50, or you are at high risk for heart disease, you may need your cholesterol levels checked more frequently.Ongoing high lipid and cholesterol levels should be treated with medicines if diet and exercise are not effective.  If you smoke, find out from your caregiver how to quit. If you do not use tobacco, do not start.  If you are pregnant, do not drink alcohol. If you are breastfeeding, be very cautious about drinking alcohol. If you are not pregnant and choose to drink alcohol, do not exceed 1 drink per day. One drink is considered to be 12 ounces (355 mL) of beer, 5 ounces (148 mL) of wine, or 1.5 ounces (44 mL) of liquor.  Avoid use of street drugs. Do not share needles with anyone. Ask for help if you need support or instructions about stopping the use of drugs.  High blood pressure causes heart disease and increases the risk of stroke. Your blood pressure should be checked at least every 1 to 2 years. Ongoing high blood pressure should be treated with medicines if weight loss and exercise are not effective.  If you are 55 to 76 years old, ask your caregiver if you should take aspirin to prevent strokes.  Diabetes   screening involves taking a blood sample to check your fasting blood sugar level. This should be done once every 3 years, after age 45, if you are within normal weight and without risk factors for diabetes. Testing should be considered at a younger age or be carried out more frequently if you are overweight and have at least 1 risk factor for diabetes.  Breast cancer screening is essential preventive care for women. You should practice "breast  self-awareness." This means understanding the normal appearance and feel of your breasts and may include breast self-examination. Any changes detected, no matter how small, should be reported to a caregiver. Women in their 20s and 30s should have a clinical breast exam (CBE) by a caregiver as part of a regular health exam every 1 to 3 years. After age 40, women should have a CBE every year. Starting at age 40, women should consider having a mammography (breast X-ray test) every year. Women who have a family history of breast cancer should talk to their caregiver about genetic screening. Women at a high risk of breast cancer should talk to their caregivers about having magnetic resonance imaging (MRI) and a mammography every year.  The Pap test is a screening test for cervical cancer. A Pap test can show cell changes on the cervix that might become cervical cancer if left untreated. A Pap test is a procedure in which cells are obtained and examined from the lower end of the uterus (cervix).  Women should have a Pap test starting at age 21.  Between ages 21 and 29, Pap tests should be repeated every 2 years.  Beginning at age 30, you should have a Pap test every 3 years as long as the past 3 Pap tests have been normal.  Some women have medical problems that increase the chance of getting cervical cancer. Talk to your caregiver about these problems. It is especially important to talk to your caregiver if a new problem develops soon after your last Pap test. In these cases, your caregiver may recommend more frequent screening and Pap tests.  The above recommendations are the same for women who have or have not gotten the vaccine for human papillomavirus (HPV).  If you had a hysterectomy for a problem that was not cancer or a condition that could lead to cancer, then you no longer need Pap tests. Even if you no longer need a Pap test, a regular exam is a good idea to make sure no other problems are  starting.  If you are between ages 65 and 70, and you have had normal Pap tests going back 10 years, you no longer need Pap tests. Even if you no longer need a Pap test, a regular exam is a good idea to make sure no other problems are starting.  If you have had past treatment for cervical cancer or a condition that could lead to cancer, you need Pap tests and screening for cancer for at least 20 years after your treatment.  If Pap tests have been discontinued, risk factors (such as a new sexual partner) need to be reassessed to determine if screening should be resumed.  The HPV test is an additional test that may be used for cervical cancer screening. The HPV test looks for the virus that can cause the cell changes on the cervix. The cells collected during the Pap test can be tested for HPV. The HPV test could be used to screen women aged 30 years and older, and should   be used in women of any age who have unclear Pap test results. After the age of 30, women should have HPV testing at the same frequency as a Pap test.  Colorectal cancer can be detected and often prevented. Most routine colorectal cancer screening begins at the age of 50 and continues through age 75. However, your caregiver may recommend screening at an earlier age if you have risk factors for colon cancer. On a yearly basis, your caregiver may provide home test kits to check for hidden blood in the stool. Use of a small camera at the end of a tube, to directly examine the colon (sigmoidoscopy or colonoscopy), can detect the earliest forms of colorectal cancer. Talk to your caregiver about this at age 50, when routine screening begins. Direct examination of the colon should be repeated every 5 to 10 years through age 75, unless early forms of pre-cancerous polyps or small growths are found.  Hepatitis C blood testing is recommended for all people born from 1945 through 1965 and any individual with known risks for hepatitis C.  Practice  safe sex. Use condoms and avoid high-risk sexual practices to reduce the spread of sexually transmitted infections (STIs). STIs include gonorrhea, chlamydia, syphilis, trichomonas, herpes, HPV, and human immunodeficiency virus (HIV). Herpes, HIV, and HPV are viral illnesses that have no cure. They can result in disability, cancer, and death. Sexually active women aged 25 and younger should be checked for chlamydia. Older women with new or multiple partners should also be tested for chlamydia. Testing for other STIs is recommended if you are sexually active and at increased risk.  Osteoporosis is a disease in which the bones lose minerals and strength with aging. This can result in serious bone fractures. The risk of osteoporosis can be identified using a bone density scan. Women ages 65 and over and women at risk for fractures or osteoporosis should discuss screening with their caregivers. Ask your caregiver whether you should take a calcium supplement or vitamin D to reduce the rate of osteoporosis.  Menopause can be associated with physical symptoms and risks. Hormone replacement therapy is available to decrease symptoms and risks. You should talk to your caregiver about whether hormone replacement therapy is right for you.  Use sunscreen with sun protection factor (SPF) of 30 or more. Apply sunscreen liberally and repeatedly throughout the day. You should seek shade when your shadow is shorter than you. Protect yourself by wearing long sleeves, pants, a wide-brimmed hat, and sunglasses year round, whenever you are outdoors.  Once a month, do a whole body skin exam, using a mirror to look at the skin on your back. Notify your caregiver of new moles, moles that have irregular borders, moles that are larger than a pencil eraser, or moles that have changed in shape or color.  Stay current with required immunizations.  Influenza. You need a dose every fall (or winter). The composition of the flu vaccine  changes each year, so being vaccinated once is not enough.  Pneumococcal polysaccharide. You need 1 to 2 doses if you smoke cigarettes or if you have certain chronic medical conditions. You need 1 dose at age 65 (or older) if you have never been vaccinated.  Tetanus, diphtheria, pertussis (Tdap, Td). Get 1 dose of Tdap vaccine if you are younger than age 65, are over 65 and have contact with an infant, are a healthcare worker, are pregnant, or simply want to be protected from whooping cough. After that, you need a Td   booster dose every 10 years. Consult your caregiver if you have not had at least 3 tetanus and diphtheria-containing shots sometime in your life or have a deep or dirty wound.  HPV. You need this vaccine if you are a woman age 26 or younger. The vaccine is given in 3 doses over 6 months.  Measles, mumps, rubella (MMR). You need at least 1 dose of MMR if you were born in 1957 or later. You may also need a second dose.  Meningococcal. If you are age 19 to 21 and a first-year college student living in a residence hall, or have one of several medical conditions, you need to get vaccinated against meningococcal disease. You may also need additional booster doses.  Zoster (shingles). If you are age 60 or older, you should get this vaccine.  Varicella (chickenpox). If you have never had chickenpox or you were vaccinated but received only 1 dose, talk to your caregiver to find out if you need this vaccine.  Hepatitis A. You need this vaccine if you have a specific risk factor for hepatitis A virus infection or you simply wish to be protected from this disease. The vaccine is usually given as 2 doses, 6 to 18 months apart.  Hepatitis B. You need this vaccine if you have a specific risk factor for hepatitis B virus infection or you simply wish to be protected from this disease. The vaccine is given in 3 doses, usually over 6 months. Preventive Services / Frequency Ages 19 to 39  Blood  pressure check.** / Every 1 to 2 years.  Lipid and cholesterol check.** / Every 5 years beginning at age 20.  Clinical breast exam.** / Every 3 years for women in their 20s and 30s.  Pap test.** / Every 2 years from ages 21 through 29. Every 3 years starting at age 30 through age 65 or 70 with a history of 3 consecutive normal Pap tests.  HPV screening.** / Every 3 years from ages 30 through ages 65 to 70 with a history of 3 consecutive normal Pap tests.  Hepatitis C blood test.** / For any individual with known risks for hepatitis C.  Skin self-exam. / Monthly.  Influenza immunization.** / Every year.  Pneumococcal polysaccharide immunization.** / 1 to 2 doses if you smoke cigarettes or if you have certain chronic medical conditions.  Tetanus, diphtheria, pertussis (Tdap, Td) immunization. / A one-time dose of Tdap vaccine. After that, you need a Td booster dose every 10 years.  HPV immunization. / 3 doses over 6 months, if you are 26 and younger.  Measles, mumps, rubella (MMR) immunization. / You need at least 1 dose of MMR if you were born in 1957 or later. You may also need a second dose.  Meningococcal immunization. / 1 dose if you are age 19 to 21 and a first-year college student living in a residence hall, or have one of several medical conditions, you need to get vaccinated against meningococcal disease. You may also need additional booster doses.  Varicella immunization.** / Consult your caregiver.  Hepatitis A immunization.** / Consult your caregiver. 2 doses, 6 to 18 months apart.  Hepatitis B immunization.** / Consult your caregiver. 3 doses usually over 6 months. Ages 40 to 64  Blood pressure check.** / Every 1 to 2 years.  Lipid and cholesterol check.** / Every 5 years beginning at age 20.  Clinical breast exam.** / Every year after age 40.  Mammogram.** / Every year beginning at age 40   and continuing for as long as you are in good health. Consult with your  caregiver.  Pap test.** / Every 3 years starting at age 30 through age 65 or 70 with a history of 3 consecutive normal Pap tests.  HPV screening.** / Every 3 years from ages 30 through ages 65 to 70 with a history of 3 consecutive normal Pap tests.  Fecal occult blood test (FOBT) of stool. / Every year beginning at age 50 and continuing until age 75. You may not need to do this test if you get a colonoscopy every 10 years.  Flexible sigmoidoscopy or colonoscopy.** / Every 5 years for a flexible sigmoidoscopy or every 10 years for a colonoscopy beginning at age 50 and continuing until age 75.  Hepatitis C blood test.** / For all people born from 1945 through 1965 and any individual with known risks for hepatitis C.  Skin self-exam. / Monthly.  Influenza immunization.** / Every year.  Pneumococcal polysaccharide immunization.** / 1 to 2 doses if you smoke cigarettes or if you have certain chronic medical conditions.  Tetanus, diphtheria, pertussis (Tdap, Td) immunization.** / A one-time dose of Tdap vaccine. After that, you need a Td booster dose every 10 years.  Measles, mumps, rubella (MMR) immunization. / You need at least 1 dose of MMR if you were born in 1957 or later. You may also need a second dose.  Varicella immunization.** / Consult your caregiver.  Meningococcal immunization.** / Consult your caregiver.  Hepatitis A immunization.** / Consult your caregiver. 2 doses, 6 to 18 months apart.  Hepatitis B immunization.** / Consult your caregiver. 3 doses, usually over 6 months. Ages 65 and over  Blood pressure check.** / Every 1 to 2 years.  Lipid and cholesterol check.** / Every 5 years beginning at age 20.  Clinical breast exam.** / Every year after age 40.  Mammogram.** / Every year beginning at age 40 and continuing for as long as you are in good health. Consult with your caregiver.  Pap test.** / Every 3 years starting at age 30 through age 65 or 70 with a 3  consecutive normal Pap tests. Testing can be stopped between 65 and 70 with 3 consecutive normal Pap tests and no abnormal Pap or HPV tests in the past 10 years.  HPV screening.** / Every 3 years from ages 30 through ages 65 or 70 with a history of 3 consecutive normal Pap tests. Testing can be stopped between 65 and 70 with 3 consecutive normal Pap tests and no abnormal Pap or HPV tests in the past 10 years.  Fecal occult blood test (FOBT) of stool. / Every year beginning at age 50 and continuing until age 75. You may not need to do this test if you get a colonoscopy every 10 years.  Flexible sigmoidoscopy or colonoscopy.** / Every 5 years for a flexible sigmoidoscopy or every 10 years for a colonoscopy beginning at age 50 and continuing until age 75.  Hepatitis C blood test.** / For all people born from 1945 through 1965 and any individual with known risks for hepatitis C.  Osteoporosis screening.** / A one-time screening for women ages 65 and over and women at risk for fractures or osteoporosis.  Skin self-exam. / Monthly.  Influenza immunization.** / Every year.  Pneumococcal polysaccharide immunization.** / 1 dose at age 65 (or older) if you have never been vaccinated.  Tetanus, diphtheria, pertussis (Tdap, Td) immunization. / A one-time dose of Tdap vaccine if you are over   65 and have contact with an infant, are a healthcare worker, or simply want to be protected from whooping cough. After that, you need a Td booster dose every 10 years.  Varicella immunization.** / Consult your caregiver.  Meningococcal immunization.** / Consult your caregiver.  Hepatitis A immunization.** / Consult your caregiver. 2 doses, 6 to 18 months apart.  Hepatitis B immunization.** / Check with your caregiver. 3 doses, usually over 6 months. ** Family history and personal history of risk and conditions may change your caregiver's recommendations. Document Released: 07/09/2001 Document Revised: 08/05/2011  Document Reviewed: 10/08/2010 [employer] Patient Information 2013 [employer]  

## 2012-03-31 NOTE — Progress Notes (Signed)
Subjective:    Emily Daniel is a 78 y.o. female who presents for Medicare Annual/Subsequent preventive examination.  Preventive Screening-Counseling & Management  Tobacco History  Smoking status  . Former Smoker -- 1.0 packs/day for 36 years  . Types: Cigarettes  . Quit date: 02/18/1984  Smokeless tobacco  . Not on file     Problems Prior to Visit 1.   Current Problems (verified) Patient Active Problem List  Diagnosis  . DIABETES MELLITUS, TYPE II, UNCONTROLLED  . HYPERLIPIDEMIA  . ANXIETY STATE, UNSPECIFIED  . HYPERTENSION  . PVD  . ARTIFICIAL MENOPAUSE  . BACK PAIN  . OSTEOPOROSIS  . ARTERIOVENOUS MALFORMATION, COLON  . CHANGE IN BOWELS  . DYSURIA  . ABDOMINAL PAIN OTHER SPECIFIED SITE  . B12 DEFICIENCY    Medications Prior to Visit Current Outpatient Prescriptions on File Prior to Visit  Medication Sig Dispense Refill  . aspirin 81 MG tablet Take 81 mg by mouth daily.        . Calcium Carbonate-Vitamin D (CALCIUM-VITAMIN D) 600-200 MG-UNIT CAPS Take by mouth 2 (two) times daily.        [name] glucose blood (ONE TOUCH ULTRA TEST) test strip Check blood sugars twice a day  100 each  9  . ibuprofen (ADVIL,MOTRIN) 400 MG tablet Take 400 mg by mouth every 6 (six) hours as needed.        [name] JANUVIA 100 MG tablet TAKE ONE TABLET BY MOUTH ONE TIME DAILY  31 tablet  2  . Lancets MISC by Does not apply route.        [name] lisinopril (PRINIVIL,ZESTRIL) 20 MG tablet TAKE ONE TABLET BY MOUTH ONE TIME DAILY  30 tablet  0  . Multiple Vitamin (MULTIVITAMIN) tablet Take 1 tablet by mouth daily.        . simvastatin (ZOCOR) 40 MG tablet TAKE ONE TABLET BY MOUTH ONE TIME DAILY  30 tablet  0  . vitamin B-12 (CYANOCOBALAMIN) 1000 MCG tablet Take 1,000 mcg by mouth daily.          Current Medications (verified) Current Outpatient Prescriptions  Medication Sig Dispense Refill  . aspirin 81 MG tablet Take 81 mg by mouth daily.        . Calcium Carbonate-Vitamin D (CALCIUM-VITAMIN D)  600-200 MG-UNIT CAPS Take by mouth 2 (two) times daily.        [name] glucose blood (ONE TOUCH ULTRA TEST) test strip Check blood sugars twice a day  100 each  9  . ibuprofen (ADVIL,MOTRIN) 400 MG tablet Take 400 mg by mouth every 6 (six) hours as needed.        [name] JANUVIA 100 MG tablet TAKE ONE TABLET BY MOUTH ONE TIME DAILY  31 tablet  2  . Lancets MISC by Does not apply route.        [name] lisinopril (PRINIVIL,ZESTRIL) 20 MG tablet TAKE ONE TABLET BY MOUTH ONE TIME DAILY  30 tablet  0  . Multiple Vitamin (MULTIVITAMIN) tablet Take 1 tablet by mouth daily.        . simvastatin (ZOCOR) 40 MG tablet TAKE ONE TABLET BY MOUTH ONE TIME DAILY  30 tablet  0  . vitamin B-12 (CYANOCOBALAMIN) 1000 MCG tablet Take 1,000 mcg by mouth daily.           Allergies (verified) Meperidine hcl and Sulfonamide derivatives   PAST HISTORY  Family History Family History  Problem Relation Age of Onset  . Cancer Mother     cervical   .  Cancer Brother     renal ...x3??  [name] Hypertension Other   . Stroke Other     Social History History  Substance Use Topics  . Smoking status: Former Smoker -- 1.0 packs/day for 36 years    Types: Cigarettes    Quit date: 02/18/1984  . Smokeless tobacco: Not on file  . Alcohol Use: No     Are there smokers in your home (other than you)? No  Risk Factors Current exercise habits: The patient does not participate in regular exercise at present.  Dietary issues discussed: na   Cardiac risk factors: advanced age (older than 49 for men, 33 for women), diabetes mellitus, dyslipidemia, hypertension and sedentary lifestyle.  Depression Screen (Note: if answer to either of the following is "Yes", a more complete depression screening is indicated)   Over the past two weeks, have you felt down, depressed or hopeless? No  Over the past two weeks, have you felt little interest or pleasure in doing things? No  Have you lost interest or pleasure in daily life? No  Do you often feel  hopeless? No  Do you cry easily over simple problems? No  Activities of Daily Living In your present state of health, do you have any difficulty performing the following activities?:  Driving? No Managing money?  No Feeding yourself? No Getting from bed to chair? No Climbing a flight of stairs? No Preparing food and eating?: No Bathing or showering? No Getting dressed: No Getting to the toilet? No Using the toilet:No Moving around from place to place: No In the past year have you fallen or had a near fall?:No   Are you sexually active?  No  Do you have more than one partner?  No  Hearing Difficulties: Yes Do you often ask people to speak up or repeat themselves? Yes Do you experience ringing or noises in your ears? No Do you have difficulty understanding soft or whispered voices? Yes   Do you feel that you have a problem with memory? No  Do you often misplace items? No  Do you feel safe at home?  Yes  Cognitive Testing  Alert? Yes  Normal Appearance?Yes  Oriented to person? Yes  Place? Yes   Time? Yes  Recall of three objects?  Yes  Can perform simple calculations? Yes  Displays appropriate judgment?Yes  Can read the correct time from a watch face?Yes   Advanced Directives have been discussed with the patient? Yes  List the Names of Other Physician/Practitioners you currently use: 1.  Opth-- dolan   Indicate any recent Medical Services you may have received from other than [facility] providers in the past year (date may be approximate).  Immunization History  Administered Date(s) Administered  . Influenza Split 03/20/2011, 03/31/2012  . Influenza Whole 03/04/2008, 03/28/2009, 03/16/2010  . Pneumococcal Polysaccharide 03/10/1997  . Tdap 04/16/2011  . Zoster 05/14/2007    Screening Tests Health Maintenance  Topic Date Due  . Hemoglobin A1c  03/29/2012  . Mammogram  04/29/2012  . Foot Exam  09/26/2012  . Urine Microalbumin  09/26/2012  . Ophthalmology Exam  10/15/2012    . Influenza Vaccine  01/25/2013  . Colonoscopy  02/01/2013  . Tetanus/tdap  04/15/2021  . Pneumococcal Polysaccharide Vaccine Age 78 And Over  Completed  . Zostavax  Completed    All answers were reviewed with the patient and necessary referrals were made:  Loreen Freud, DO   03/31/2012   History reviewed:  She  has a past  medical history of Hyperlipidemia and Hypertension. She  does not have any pertinent problems on file. She  has past surgical history that includes Appendectomy; Abdominal hysterectomy; and Hemorrhoid surgery. Her family history includes Cancer in her brother and mother; Hypertension in her other; and Stroke in her other. She  reports that she quit smoking about 28 years ago. Her smoking use included Cigarettes. She has a 36 pack-year smoking history. She does not have any smokeless tobacco history on file. She reports that she does not drink alcohol or use illicit drugs. She has a current medication list which includes the following prescription(s): aspirin, calcium-vitamin d, glucose blood, ibuprofen, januvia, lancets, lisinopril, multivitamin, simvastatin, and vitamin b-12. Current Outpatient Prescriptions on File Prior to Visit  Medication Sig Dispense Refill  . aspirin 81 MG tablet Take 81 mg by mouth daily.        . Calcium Carbonate-Vitamin D (CALCIUM-VITAMIN D) 600-200 MG-UNIT CAPS Take by mouth 2 (two) times daily.        [name] glucose blood (ONE TOUCH ULTRA TEST) test strip Check blood sugars twice a day  100 each  9  . ibuprofen (ADVIL,MOTRIN) 400 MG tablet Take 400 mg by mouth every 6 (six) hours as needed.        [name] JANUVIA 100 MG tablet TAKE ONE TABLET BY MOUTH ONE TIME DAILY  31 tablet  2  . Lancets MISC by Does not apply route.        [name] lisinopril (PRINIVIL,ZESTRIL) 20 MG tablet TAKE ONE TABLET BY MOUTH ONE TIME DAILY  30 tablet  0  . Multiple Vitamin (MULTIVITAMIN) tablet Take 1 tablet by mouth daily.        . simvastatin (ZOCOR) 40 MG tablet TAKE ONE TABLET BY  MOUTH ONE TIME DAILY  30 tablet  0  . vitamin B-12 (CYANOCOBALAMIN) 1000 MCG tablet Take 1,000 mcg by mouth daily.         She is allergic to meperidine hcl and sulfonamide derivatives.  Review of Systems  Review of Systems  Constitutional: Negative for activity change, appetite change and fatigue.  HENT: Negative for hearing loss, congestion, tinnitus and ear discharge.   Eyes: Negative for visual disturbance (see optho q1y -- vision corrected with glasses).  Respiratory: Negative for cough, chest tightness and shortness of breath.   Cardiovascular: Negative for chest pain, palpitations and leg swelling.  Gastrointestinal: Negative for abdominal pain, diarrhea, constipation and abdominal distention.  Genitourinary: Negative for urgency, frequency, decreased urine volume and difficulty urinating.  Musculoskeletal: Negative for back pain, arthralgias and gait problem.  Skin: Negative for color change, pallor and rash.  Neurological: Negative for dizziness, light-headedness, numbness and headaches.  Hematological: Negative for adenopathy. Does not bruise/bleed easily.  Psychiatric/Behavioral: Negative for suicidal ideas, confusion, sleep disturbance, self-injury, dysphoric mood, decreased concentration and agitation.  Pt is able to read and write and can do all ADLs No risk for falling No abuse/ violence in home      Objective:     Vision by Snellen chart: opth  Body mass index is 25.62 kg/(m^2). BP 140/76  Pulse 83  Temp 98.9 F (37.2 C) (Oral)  Ht 5' 0.5" (1.537 m)  Wt 133 lb 6.4 oz (60.51 kg)  BMI 25.62 kg/m2  SpO2 98%  BP 140/76  Pulse 83  Temp 98.9 F (37.2 C) (Oral)  Ht 5' 0.5" (1.537 m)  Wt 133 lb 6.4 oz (60.51 kg)  BMI 25.62 kg/m2  SpO2 98% General appearance: alert, cooperative, appears  stated age and no distress Head: Normocephalic, without obvious abnormality, atraumatic Eyes: negative findings: lids and lashes normal, conjunctivae and sclerae normal and  pupils equal, round, reactive to light and accomodation Ears: normal TM's and external ear canals both ears Nose: Nares normal. Septum midline. Mucosa normal. No drainage or sinus tenderness. Throat: lips, mucosa, and tongue normal; teeth and gums normal Neck: no adenopathy, no carotid bruit, no JVD, supple, symmetrical, trachea midline and thyroid not enlarged, symmetric, no tenderness/mass/nodules Back: symmetric, no curvature. ROM normal. No CVA tenderness. Lungs: clear to auscultation bilaterally Breasts: normal appearance, no masses or tenderness Heart: regular rate and rhythm, S1, S2 normal, no murmur, click, rub or gallop Abdomen: soft, non-tender; bowel sounds normal; no masses,  no organomegaly Pelvic: not indicated; post-menopausal, no abnormal Pap smears in past Extremities: extremities normal, atraumatic, no cyanosis or edema Pulses: 2+ and symmetric Skin: Skin color, texture, turgor normal. No rashes or lesions Lymph nodes: Cervical, supraclavicular, and axillary nodes normal. Neurologic: Alert and oriented X 3, normal strength and tone. Normal symmetric reflexes. Normal coordination and gait Psych- no depression, anxiety     Assessment:     cpe     Plan:     During the course of the visit the patient was educated and counseled about appropriate screening and preventive services including:    Pneumococcal vaccine   Influenza vaccine  Screening mammography  Bone densitometry screening  Colorectal cancer screening  Diabetes screening  Glaucoma screening  Nutrition counseling   Advanced directives: has an advanced directive - a copy HAS NOT been provided.  Diet review for nutrition referral? Yes ____  Not Indicated __x__   Patient Instructions (the written plan) was given to the patient.  Medicare Attestation I have personally reviewed: The patient's medical and social history Their use of alcohol, tobacco or illicit drugs Their current medications  and supplements The patient's functional ability including ADLs,fall risks, home safety risks, cognitive, and hearing and visual impairment Diet and physical activities Evidence for depression or mood disorders  The patient's weight, height, BMI, and visual acuity have been recorded in the chart.  I have made referrals, counseling, and provided education to the patient based on review of the above and I have provided the patient with a written personalized care plan for preventive services.     Loreen Freud, DO   03/31/2012

## 2012-03-31 NOTE — Assessment & Plan Note (Signed)
con't ca and vita D bmd next year

## 2012-03-31 NOTE — Assessment & Plan Note (Signed)
Check labs con't meds 

## 2012-03-31 NOTE — Assessment & Plan Note (Signed)
Stable Cont meds 

## 2012-04-13 ENCOUNTER — Other Ambulatory Visit: Payer: Self-pay | Admitting: Family Medicine

## 2012-04-14 NOTE — Telephone Encounter (Signed)
Rx sent.    MW 

## 2012-05-15 ENCOUNTER — Other Ambulatory Visit: Payer: Medicare Other

## 2012-05-15 ENCOUNTER — Other Ambulatory Visit: Payer: Self-pay

## 2012-05-15 DIAGNOSIS — Z0000 Encounter for general adult medical examination without abnormal findings: Secondary | ICD-10-CM

## 2012-05-15 LAB — FECAL OCCULT BLOOD, IMMUNOCHEMICAL: Fecal Occult Bld: POSITIVE

## 2012-05-18 ENCOUNTER — Telehealth: Payer: Self-pay

## 2012-05-18 DIAGNOSIS — R195 Other fecal abnormalities: Secondary | ICD-10-CM

## 2012-05-18 NOTE — Telephone Encounter (Signed)
Patient aware of results, patient states she prefers a Female GI doctor

## 2012-05-18 NOTE — Telephone Encounter (Signed)
Message copied by Maurice Small on Mon May 18, 2012  5:49 PM ------      Message from: Lelon Perla      Created: Sat May 16, 2012  6:29 PM       i fob--- + blood      Refer to gi

## 2012-05-19 ENCOUNTER — Encounter: Payer: Self-pay | Admitting: Gastroenterology

## 2012-05-19 NOTE — Telephone Encounter (Signed)
Error

## 2012-05-22 IMAGING — CR DG FOOT COMPLETE 3+V*L*
3 series · 3 of 3 positions shown · non-contrast
Comparison: None.

CLINICAL DATA: Pain post blunt trauma.

LEFT FOOT - COMPLETE 3+ VIEW

[t foot ap left]
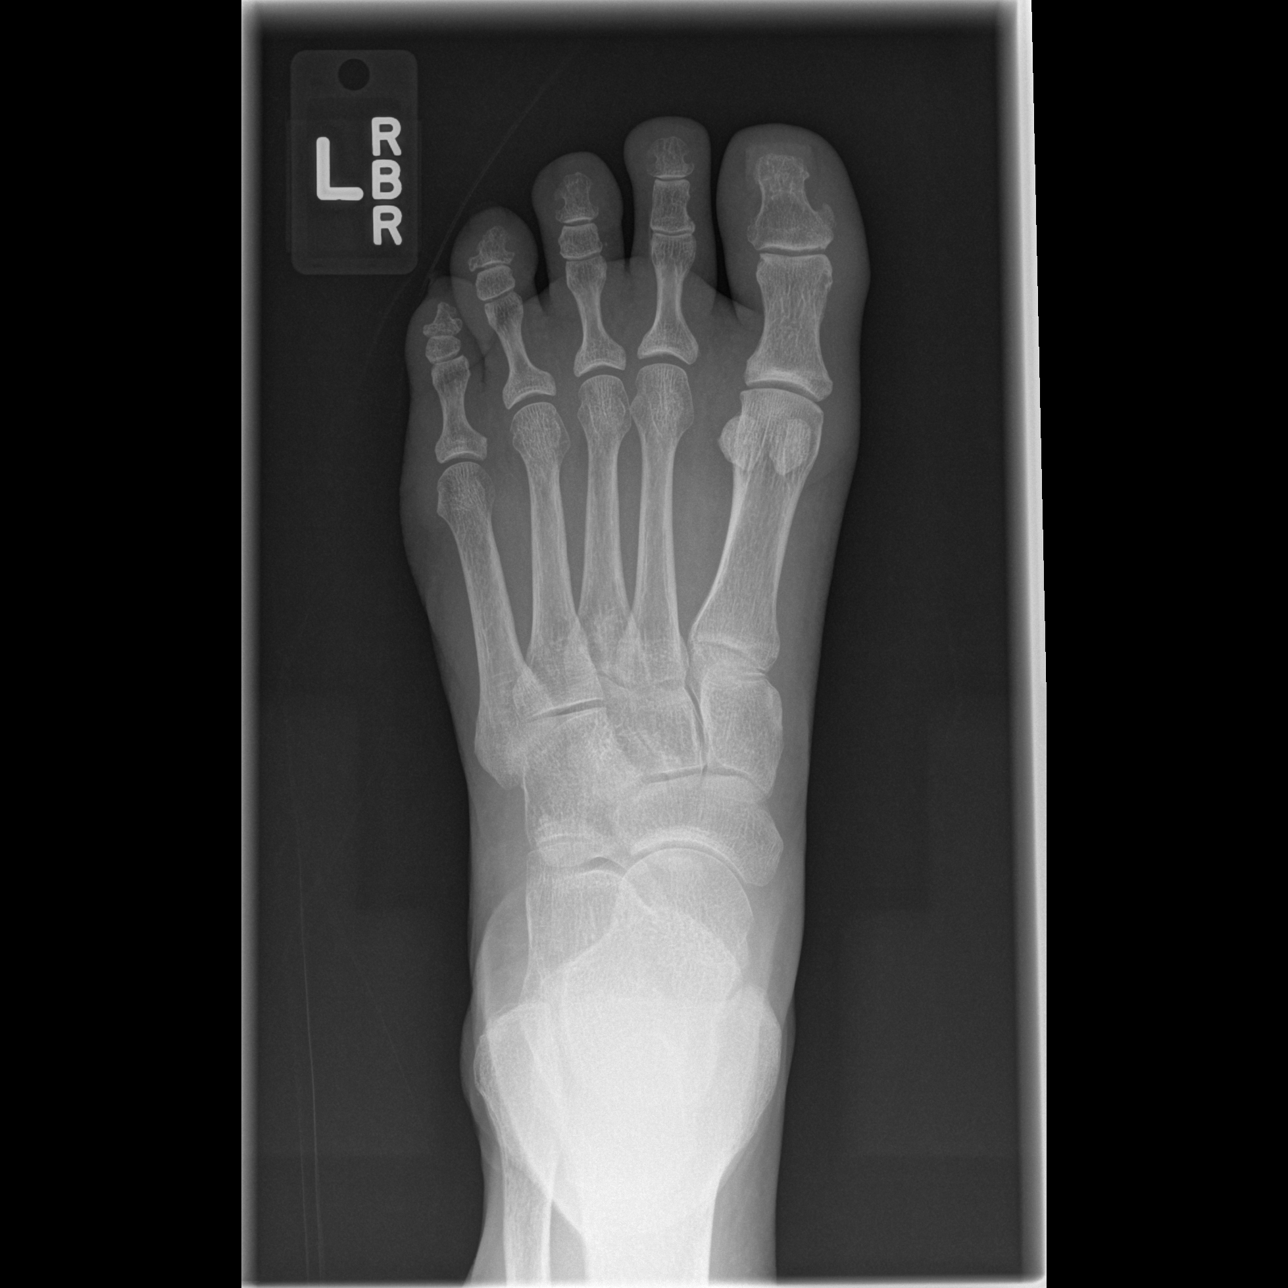

[t foot oblique left]
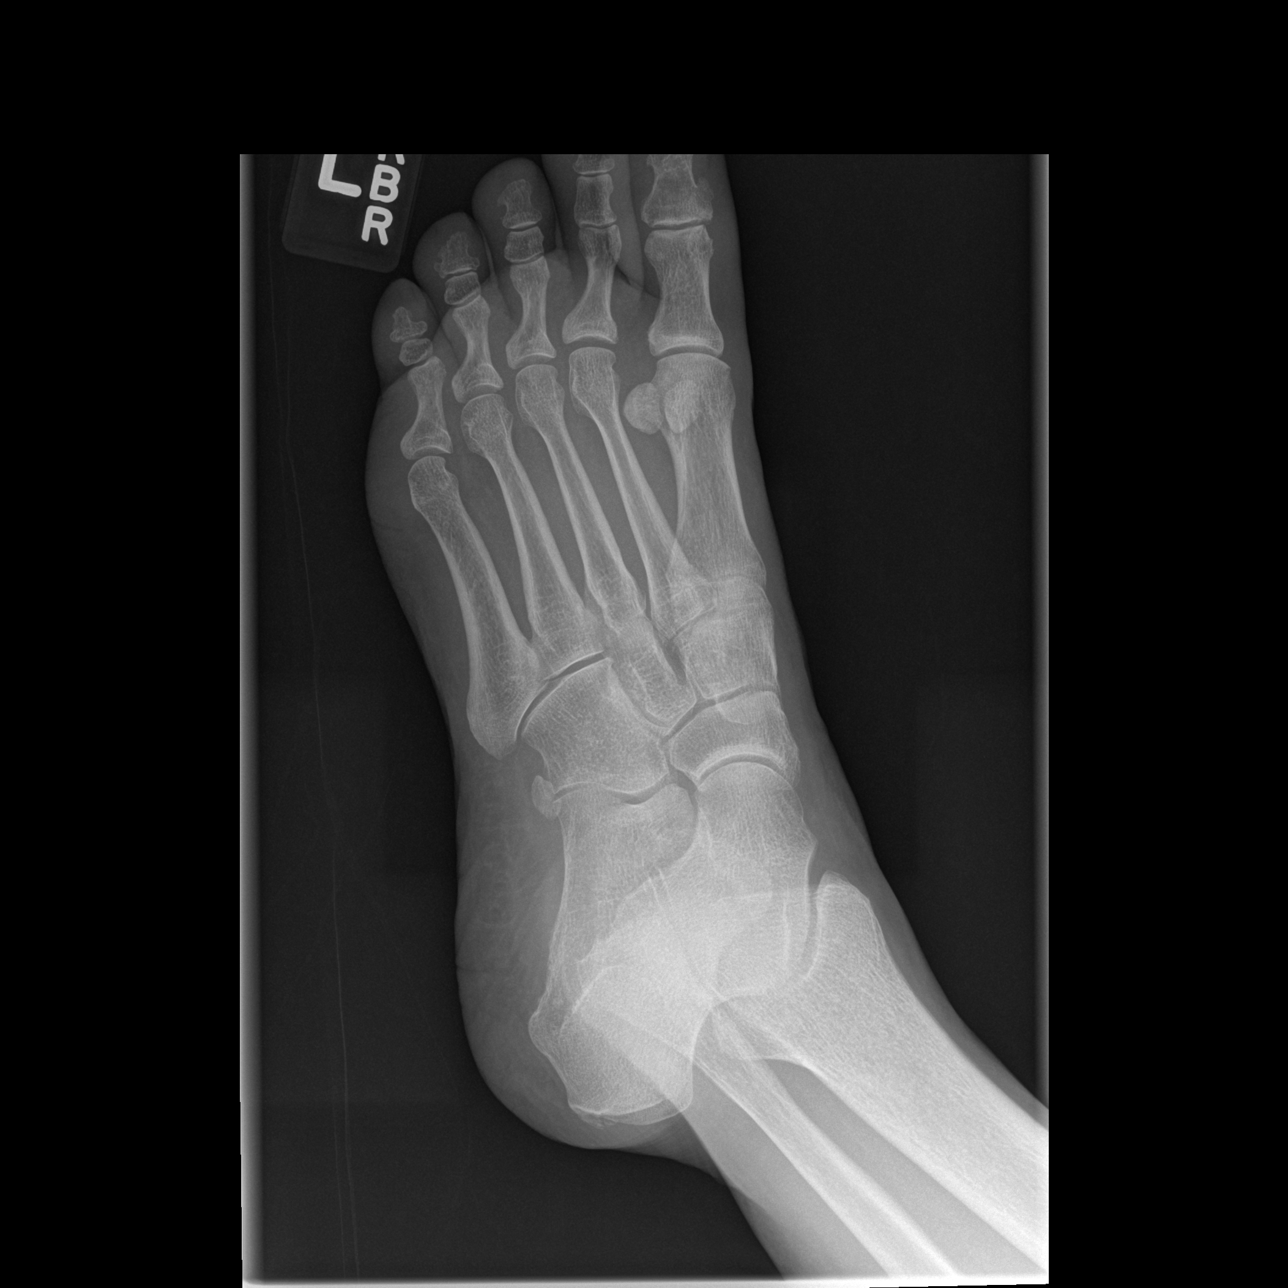

[t foot lat left]
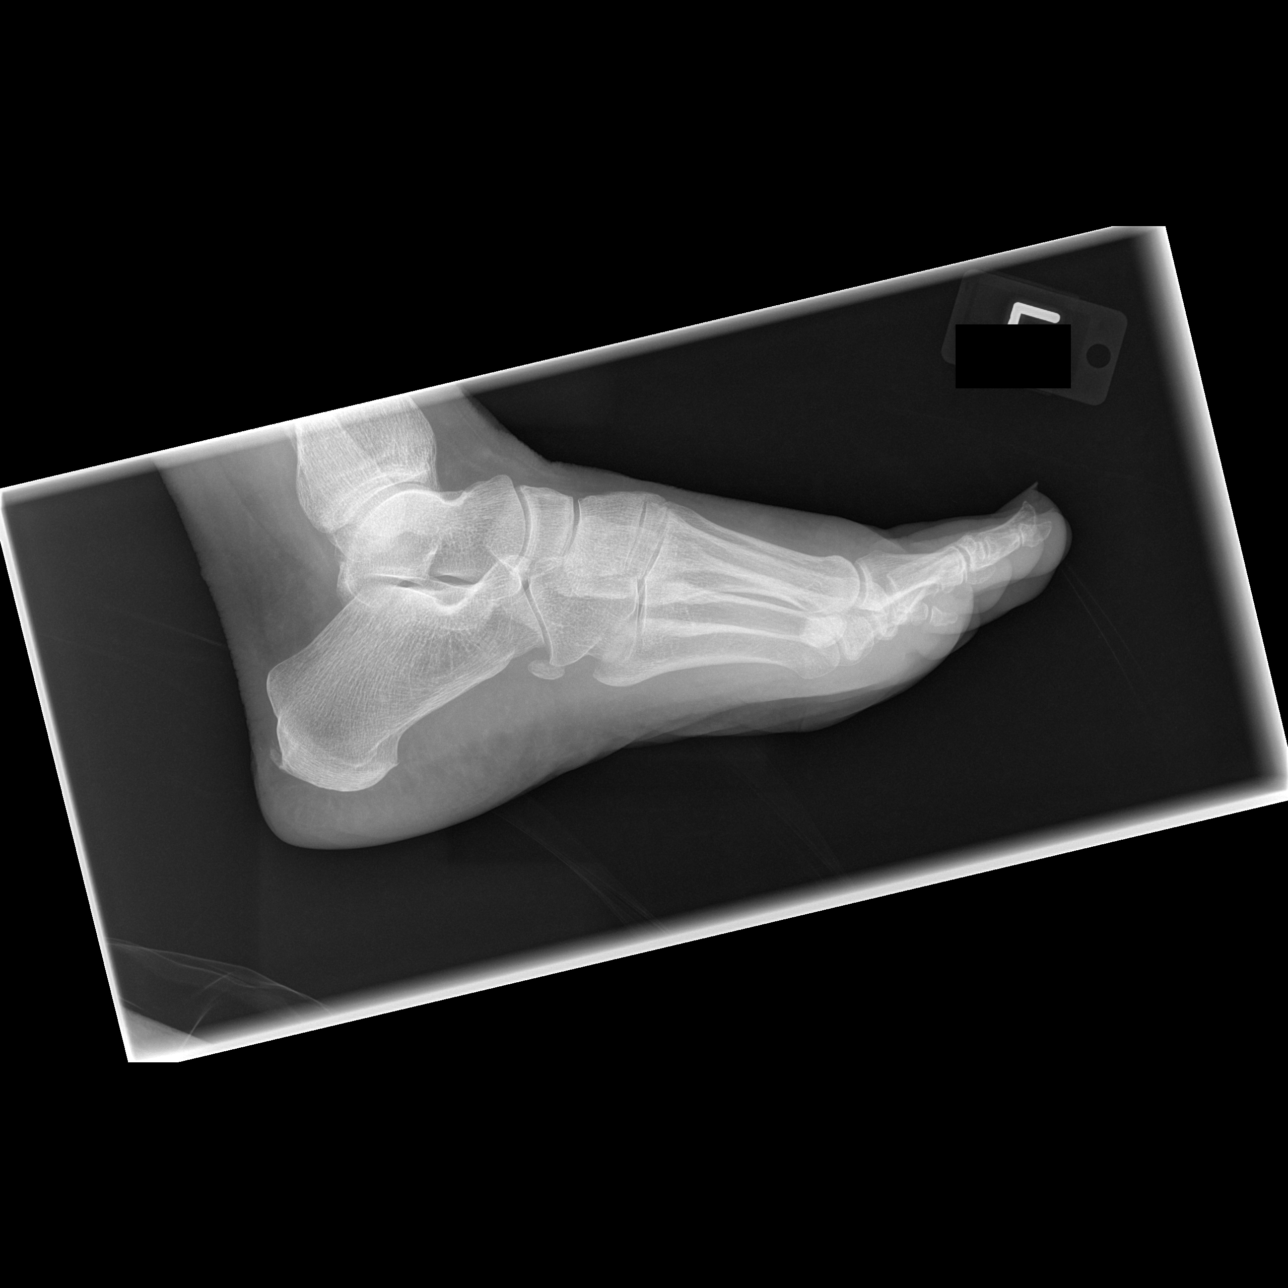

[3 of 3 positions shown; findings below may reference images not displayed]

FINDINGS: Negative for fracture, dislocation, or other acute
abnormality.  Normal alignment and mineralization. No significant
degenerative change.  Regional soft tissues unremarkable.
IMPRESSION: Negative

## 2012-06-02 ENCOUNTER — Encounter: Payer: Self-pay | Admitting: Vascular Surgery

## 2012-06-10 ENCOUNTER — Encounter: Payer: Self-pay | Admitting: Gastroenterology

## 2012-06-10 ENCOUNTER — Ambulatory Visit (INDEPENDENT_AMBULATORY_CARE_PROVIDER_SITE_OTHER): Payer: Medicare Other | Admitting: Gastroenterology

## 2012-06-10 VITALS — BP 170/80 | HR 88 | Ht 61.0 in | Wt 133.8 lb

## 2012-06-10 DIAGNOSIS — R195 Other fecal abnormalities: Secondary | ICD-10-CM

## 2012-06-10 MED ORDER — PEG-KCL-NACL-NASULF-NA ASC-C 100 G PO SOLR: 1.0000 | Freq: Once | ORAL | Status: DC

## 2012-06-10 NOTE — Progress Notes (Signed)
History of Present Illness: This is an 79 year old female accompanied by her daughter. She was recently found to have Hemoccult positive stool. She has occasional discomfort in the right side of her anal area following bowel movements. These symptoms have occurred intermittently for the past few months. She feels that she has hemorrhoids. She has mild constipation which is easily treated with a stool softener as needed. See prior colonoscopy report from Dr. Virginia Rochester in 2004 below. Denies weight loss, abdominal pain, constipation, diarrhea, change in stool caliber, melena, hematochezia, nausea, vomiting, dysphagia, reflux symptoms, chest pain.   Colonoscopy in 05/2002 by Dr. Virginia Rochester: large arteriovenous malformation (AVM) of ascending eradicated  with the APC technique.  Review of Systems: Pertinent positive and negative review of systems were noted in the above HPI section. All other review of systems were otherwise negative.  Current Medications, Allergies, Past Medical History, Past Surgical History, Family History and Social History were reviewed in [employer] record.  Physical Exam: General: Well developed , well nourished, no acute distress Head: Normocephalic and atraumatic Eyes:  sclerae anicteric, EOMI Ears: Normal auditory acuity Mouth: No deformity or lesions Neck: Supple, no masses or thyromegaly Lungs: Clear throughout to auscultation Heart: Regular rate and rhythm; no murmurs, rubs or bruits Abdomen: Soft, non tender and non distended. No masses, hepatosplenomegaly or hernias noted. Normal Bowel sounds Rectal: Deferred to colonoscopy Musculoskeletal: Symmetrical with no gross deformities  Skin: No lesions on visible extremities Pulses:  Normal pulses noted Extremities: No clubbing, cyanosis, edema or deformities noted Neurological: Alert oriented x 4, grossly nonfocal Cervical Nodes:  No significant cervical adenopathy Inguinal Nodes: No significant inguinal  adenopathy Psychological:  Alert and cooperative. Anxious  Assessment and Recommendations:  1. Hemoccult positive stool. Mild anal discomfort. History of a colonic AVM. Rule out colorectal neoplasms, AVMs, hemorrhoids and other disorders. Schedule colonoscopy. The risks, benefits, and alternatives to colonoscopy with possible biopsy and possible polypectomy were discussed with the patient and they consent to proceed.

## 2012-06-10 NOTE — Patient Instructions (Addendum)
You have been scheduled for a colonoscopy with propofol. Please follow written instructions given to you at your visit today.  Please pick up your prep kit at the pharmacy within the next 1-3 days. If you use inhalers (even only as needed) or a CPAP machine, please bring them with you on the day of your procedure.  

## 2012-06-15 ENCOUNTER — Encounter: Payer: Self-pay | Admitting: Family Medicine

## 2012-07-10 ENCOUNTER — Other Ambulatory Visit: Payer: Self-pay | Admitting: Family Medicine

## 2012-07-11 ENCOUNTER — Other Ambulatory Visit: Payer: Self-pay | Admitting: Family Medicine

## 2012-07-21 ENCOUNTER — Ambulatory Visit (AMBULATORY_SURGERY_CENTER): Payer: Medicare Other | Admitting: Gastroenterology

## 2012-07-21 ENCOUNTER — Encounter: Payer: Self-pay | Admitting: Gastroenterology

## 2012-07-21 ENCOUNTER — Other Ambulatory Visit: Payer: Self-pay | Admitting: Gastroenterology

## 2012-07-21 VITALS — BP 158/84 | HR 75 | Temp 97.4°F | Resp 24 | Ht 61.0 in | Wt 133.0 lb

## 2012-07-21 DIAGNOSIS — R195 Other fecal abnormalities: Secondary | ICD-10-CM

## 2012-07-21 MED ORDER — SODIUM CHLORIDE 0.9 % IV SOLN: 500.0000 mL | INTRAVENOUS | Status: DC

## 2012-07-21 NOTE — Patient Instructions (Addendum)
YOU HAD AN ENDOSCOPIC PROCEDURE TODAY AT THE [facility]: Refer to the procedure report that was given to you for any specific questions about what was found during the examination.  If the procedure report does not answer your questions, please call your gastroenterologist to clarify.  If you requested that your care partner not be given the details of your procedure findings, then the procedure report has been included in a sealed envelope for you to review at your convenience later.  YOU SHOULD EXPECT: Some feelings of bloating in the abdomen. Passage of more gas than usual.  Walking can help get rid of the air that was put into your GI tract during the procedure and reduce the bloating. If you had a lower endoscopy (such as a colonoscopy or flexible sigmoidoscopy) you may notice spotting of blood in your stool or on the toilet paper. If you underwent a bowel prep for your procedure, then you may not have a normal bowel movement for a few days.  DIET: Your first meal following the procedure should be a light meal and then it is ok to progress to your normal diet.  A half-sandwich or bowl of soup is an example of a good first meal.  Heavy or fried foods are harder to digest and may make you feel nauseous or bloated.  Likewise meals heavy in dairy and vegetables can cause extra gas to form and this can also increase the bloating.  Drink plenty of fluids but you should avoid alcoholic beverages for 24 hours.  ACTIVITY: Your care partner should take you home directly after the procedure.  You should plan to take it easy, moving slowly for the rest of the day.  You can resume normal activity the day after the procedure however you should NOT DRIVE or use heavy machinery for 24 hours (because of the sedation medicines used during the test).    SYMPTOMS TO REPORT IMMEDIATELY: A gastroenterologist can be reached at any hour.  During normal business hours, 8:30 AM to 5:00 PM Monday through Friday,  call [phone].  After hours and on weekends, please call the GI answering service at [phone] who will take a message and have the physician on call contact you.   Following lower endoscopy (colonoscopy or flexible sigmoidoscopy):  Excessive amounts of blood in the stool  Significant tenderness or worsening of abdominal pains  Swelling of the abdomen that is new, acute  Fever of 100F or higher   FOLLOW UP: If any biopsies were taken you will be contacted by phone or by letter within the next 1-3 weeks.  Call your gastroenterologist if you have not heard about the biopsies in 3 weeks.  Our staff will call the home number listed on your records the next business day following your procedure to check on you and address any questions or concerns that you may have at that time regarding the information given to you following your procedure. This is a courtesy call and so if there is no answer at the home number and we have not heard from you through the emergency physician on call, we will assume that you have returned to your regular daily activities without incident.  SIGNATURES/CONFIDENTIALITY: You and/or your care partner have signed paperwork which will be entered into your electronic medical record.  These signatures attest to the fact that that the information above on your After Visit Summary has been reviewed and is understood.  Full responsibility of the confidentiality of   this discharge information lies with you and/or your care-partner.  Information on hemorrhoids given to you today 

## 2012-07-21 NOTE — Progress Notes (Signed)
Patient did not experience any of the following events: a burn prior to discharge; a fall within the facility; wrong site/side/patient/procedure/implant event; or a hospital transfer or hospital admission upon discharge from the facility. (G8907) Patient did not have preoperative order for IV antibiotic SSI prophylaxis. (G8918)  

## 2012-07-21 NOTE — Op Note (Signed)
[facility] [address] [place], [address]   COLONOSCOPY PROCEDURE REPORT  PATIENT: Emily, Daniel  MR#: [id number] BIRTHDATE: May 24, 1930 , 82  yrs. old GENDER: Female ENDOSCOPIST: Meryl Dare, MD, [facility] PROCEDURE DATE:  07/21/2012 PROCEDURE:   Colonoscopy, diagnostic ASA CLASS:   Class II INDICATIONS:heme-positive stool. MEDICATIONS: MAC sedation, administered by CRNA and propofol (Diprivan) 200mg  IV DESCRIPTION OF PROCEDURE:   After the risks benefits and alternatives of the procedure were thoroughly explained, informed consent was obtained.  A digital rectal exam revealed no abnormalities of the rectum.   The LB [id number]  endoscope was introduced through the anus and advanced to the cecum, which was identified by both the appendix and ileocecal valve. No adverse events experienced.   The quality of the prep was excellent, using MoviPrep  The instrument was then slowly withdrawn as the colon was fully examined.  COLON FINDINGS: Medium sized angiodysplastic lesion was found in the ascending colon. It had changes c/w prior ablation therapy.   The colon was otherwise normal.  There was no diverticulosis, inflammation, polyps or cancers unless previously stated. Retroflexed views revealed moderate internal hemorrhoids. The time to cecum=4 minutes 14 seconds.  Withdrawal time=8 minutes 51 seconds.  The scope was withdrawn and the procedure completed.  COMPLICATIONS: There were no complications.  ENDOSCOPIC IMPRESSION: 1.   Medium sized angiodysplastic lesion and in the ascending colon 2.   Moderate internal hemorrhoids  RECOMMENDATIONS: 1. Given your age, you will not need another colonoscopy for colon cancer screening or polyp surveillance.  These types of tests usually stop around the age 66. 2.  Heme pos stool from AVM or hemorrhoids   eSigned:  Meryl Dare, MD, [facility] 07/21/2012 9:23 AM

## 2012-07-22 ENCOUNTER — Telehealth: Payer: Self-pay

## 2012-07-22 NOTE — Telephone Encounter (Signed)
  Follow up Call-  Call back number 07/21/2012  Post procedure Call Back phone  # [phone]  Permission to leave phone message Yes     Patient questions:  Do you have a fever, pain , or abdominal swelling? no Pain Score  0 *  Have you tolerated food without any problems? yes  Have you been able to return to your normal activities? yes  Do you have any questions about your discharge instructions: Diet   no Medications  no Follow up visit  no  Do you have questions or concerns about your Care? no  Actions: * If pain score is 4 or above: No action needed, pain <4.   The pt asked about what a AVM was and asked about the medium sized lesion.  I explained what these were to the pt and not to worry about either one.  Dr. Russella Dar did not feel we needed to repeat the colonoscopy unless a new problem arises. Maw

## 2012-10-02 ENCOUNTER — Ambulatory Visit (INDEPENDENT_AMBULATORY_CARE_PROVIDER_SITE_OTHER): Payer: Medicare Other | Admitting: Family Medicine

## 2012-10-02 ENCOUNTER — Encounter: Payer: Self-pay | Admitting: Family Medicine

## 2012-10-02 VITALS — BP 150/74 | HR 75 | Temp 98.6°F | Wt 130.0 lb

## 2012-10-02 DIAGNOSIS — I10 Essential (primary) hypertension: Secondary | ICD-10-CM

## 2012-10-02 DIAGNOSIS — E785 Hyperlipidemia, unspecified: Secondary | ICD-10-CM

## 2012-10-02 DIAGNOSIS — E119 Type 2 diabetes mellitus without complications: Secondary | ICD-10-CM

## 2012-10-02 DIAGNOSIS — M810 Age-related osteoporosis without current pathological fracture: Secondary | ICD-10-CM

## 2012-10-02 LAB — LIPID PANEL
Cholesterol: 136 mg/dL (ref 0–200)
HDL: 47.4 mg/dL (ref >39.00)
VLDL: 16.4 mg/dL (ref 0.0–40.0)

## 2012-10-02 LAB — HEPATIC FUNCTION PANEL
Albumin: 3.8 g/dL (ref 3.5–5.2)
Total Protein: 6.7 g/dL (ref 6.0–8.3)

## 2012-10-02 LAB — BASIC METABOLIC PANEL
BUN: 15 mg/dL (ref 6–23)
CO2: 24 mEq/L (ref 19–32)
Calcium: 9.1 mg/dL (ref 8.4–10.5)
GFR: 54.09 mL/min — ABNORMAL LOW (ref >60.00)
Glucose, Bld: 103 mg/dL — ABNORMAL HIGH (ref 70–99)

## 2012-10-02 MED ORDER — SITAGLIPTIN PHOSPHATE 100 MG PO TABS: ORAL_TABLET | ORAL | Status: DC

## 2012-10-02 MED ORDER — LISINOPRIL 20 MG PO TABS: 20.0000 mg | ORAL_TABLET | Freq: Every day | ORAL | Status: DC

## 2012-10-02 NOTE — Assessment & Plan Note (Signed)
Stable con't meds 

## 2012-10-02 NOTE — Addendum Note (Signed)
Addended by: Deakon Frix D on: 10/02/2012 04:39 PM   Modules accepted: Orders  

## 2012-10-02 NOTE — Assessment & Plan Note (Signed)
Stable Cont meds Check labs 

## 2012-10-02 NOTE — Assessment & Plan Note (Signed)
Check labs con't meds 

## 2012-10-02 NOTE — Patient Instructions (Addendum)
Diabetes and Standards of Medical Care  Diabetes is complicated. You may find that your diabetes team includes a dietitian, nurse, diabetes educator, eye doctor, and more. To help everyone know what is going on and to help you get the care you deserve, the following schedule of care was developed to help keep you on track. Below are the tests, exams, vaccines, medicines, education, and plans you will need. A1c test  Performed at least 2 times a year if you are meeting treatment goals.  Performed 4 times a year if therapy has changed or if you are not meeting therapy/glycemic goals. Aspirin medicine  Take daily as directed by your caregiver. Blood pressure test  Performed at every routine medical visit. The goal is less than 130/80 mm/Hg. Dental exam  Get a dental exam at least 2 times a year. Dilated eye exam (retinal exam)  Type 1 diabetes: Get an exam within 5 years of diagnosis and then yearly.  Type 2 diabetes: Get an exam at diagnosis and then yearly. All exams thereafter can be extended to every 2 to 3 years if one or more exams have been normal. Foot care exam  Visual foot exams are performed at every routine medical visit. The exams check for cuts, injuries, or other problems with the feet.  A comprehensive foot exam should be done yearly. This includes visual inspection as well as assessing foot pulses and testing for loss of sensation. Kidney function test (urine microalbumin)  Performed once a year.  Type 1 diabetes: The first test is performed 5 years after diagnosis.  Type 2 diabetes: The first test is performed at the time of diagnosis.  A serum creatinine and estimated glomerular filtration rate (eGFR) test is done once a year to tell the level of chronic kidney disease (CKD), if present. Lipid profile (Cholesterol, HDL, LDL, Triglycerides)  Performed once a year for most people. If at low risk, may be assessed every 2 years.  The goal for LDL is less than 100  mg/dl. If at high risk, the goal is less than 70 mg/dl.  The goal for HDL is higher than 40 mg/dl for men and higher than 50 mg/dl for women.  The goal for triglycerides is less than 150 mg/dl. Flu vaccine, pneumonia vaccine, and hepatitis B vaccine  The flu vaccine is recommended yearly.  The pneumonia vaccine is generally given once in a lifetime. However, there are some instances where another vaccine is recommended. Check with your caregiver.  The hepatitis B vaccine is also recommended for adults with diabetes. Diabetes self-management education  Recommended at diagnosis and ongoing as needed. Treatment plan  Reviewed at every medical visit. Document Released: 03/10/2009 Document Revised: 08/05/2011 Document Reviewed: 11/13/2010 [employer] Patient Information 2013 [employer].  

## 2012-10-02 NOTE — Addendum Note (Signed)
Addended by: Silvio Pate D on: 10/02/2012 04:39 PM   Modules accepted: Orders

## 2012-10-02 NOTE — Assessment & Plan Note (Signed)
con't meds bmd due this year

## 2012-10-02 NOTE — Progress Notes (Signed)
  Subjective:    Patient ID: Emily Daniel, female    DOB: [birth year], 79 y.o.   MRN: [id number]  HPI HYPERTENSION Disease Monitoring Blood pressure range-127/70  Chest pain- no      Dyspnea- no Medications Compliance- good Lightheadedness- no   Edema- no   DIABETES Disease Monitoring Blood Sugar ranges-110-170 Polyuria- no New Visual problems- no Medications Compliance- good Hypoglycemic symptoms- no   HYPERLIPIDEMIA Disease Monitoring See symptoms for Hypertension Medications Compliance- good RUQ pain- no  Muscle aches- no  ROS See HPI above   PMH Smoking Status noted      Review of Systems As above     Objective:   Physical Exam BP 150/74  Pulse 75  Temp(Src) 98.6 F (37 C) (Oral)  Wt 130 lb (58.968 kg)  BMI 24.58 kg/m2  SpO2 98% General appearance: alert, cooperative, appears stated age and no distress Ears: normal TM's and external ear canals both ears Nose: Nares normal. Septum midline. Mucosa normal. No drainage or sinus tenderness. Throat: lips, mucosa, and tongue normal; teeth and gums normal Neck: no adenopathy, no carotid bruit, supple, symmetrical, trachea midline and thyroid not enlarged, symmetric, no tenderness/mass/nodules Lungs: clear to auscultation bilaterally Heart: S1, S2 normal  + Murmur Extremities: extremities normal, atraumatic, no cyanosis or edema Neurologic: Alert and oriented X 3, normal strength and tone. Normal symmetric reflexes. Normal coordination and gait Psych-- no depression, no anxiety       Assessment & Plan:

## 2012-10-03 LAB — MICROALBUMIN / CREATININE URINE RATIO
Creatinine, Urine: 77.5 mg/dL
Microalb Creat Ratio: 7.4 mg/g (ref 0.0–30.0)
Microalb, Ur: 0.57 mg/dL (ref 0.00–1.89)

## 2012-10-06 MED ORDER — GLUCOSE BLOOD VI STRP: ORAL_STRIP | Status: DC

## 2012-10-06 NOTE — Addendum Note (Signed)
Addended by: Candie Echevaria L on: 10/06/2012 05:29 PM   Modules accepted: Orders

## 2012-11-28 ENCOUNTER — Other Ambulatory Visit: Payer: Self-pay | Admitting: Family Medicine

## 2012-12-03 ENCOUNTER — Other Ambulatory Visit: Payer: Self-pay

## 2013-03-29 ENCOUNTER — Ambulatory Visit: Payer: Medicare Other | Admitting: Family Medicine

## 2013-03-31 ENCOUNTER — Telehealth: Payer: Self-pay

## 2013-03-31 NOTE — Telephone Encounter (Signed)
Medication and allergies: updated and reviewed  90 day supply/mail order: na [facility]   Immunizations due:  Admin flu vaccine   A/P: No changes to FH or [facility]   Eye exam--12/2012 Foot exam--09/2012 A1c--09/2012 Microalbumin--09/2012 MMG--04/2012--scheduled 05/04/2013 Dexa--02/2011--scheduled 05/04/2013 Tdap--03/2011 Pneumonia- 02/1997 zostavax--04/2007 CCS--06/2012--Dr Stark--per provider--no further [facility] needed Flu vaccine this visit  To Discuss with Provider: Cataract surgery 04/26/2013 Stuffy nose/throat congestion--been racking the yard--allergies?

## 2013-04-01 ENCOUNTER — Encounter: Payer: Self-pay | Admitting: Family Medicine

## 2013-04-01 ENCOUNTER — Ambulatory Visit (INDEPENDENT_AMBULATORY_CARE_PROVIDER_SITE_OTHER): Payer: Medicare Other | Admitting: Family Medicine

## 2013-04-01 VITALS — BP 150/76 | HR 88 | Temp 98.6°F | Ht 60.5 in | Wt 135.4 lb

## 2013-04-01 DIAGNOSIS — E1159 Type 2 diabetes mellitus with other circulatory complications: Secondary | ICD-10-CM

## 2013-04-01 DIAGNOSIS — Z0000 Encounter for general adult medical examination without abnormal findings: Secondary | ICD-10-CM

## 2013-04-01 DIAGNOSIS — I10 Essential (primary) hypertension: Secondary | ICD-10-CM

## 2013-04-01 DIAGNOSIS — IMO0001 Reserved for inherently not codable concepts without codable children: Secondary | ICD-10-CM

## 2013-04-01 DIAGNOSIS — E785 Hyperlipidemia, unspecified: Secondary | ICD-10-CM

## 2013-04-01 DIAGNOSIS — Z1331 Encounter for screening for depression: Secondary | ICD-10-CM

## 2013-04-01 LAB — BASIC METABOLIC PANEL
BUN: 16 mg/dL (ref 6–23)
CO2: 26 mEq/L (ref 19–32)
Calcium: 9.2 mg/dL (ref 8.4–10.5)
Creatinine, Ser: 1 mg/dL (ref 0.4–1.2)
GFR: 56.53 mL/min — ABNORMAL LOW (ref >60.00)
Glucose, Bld: 102 mg/dL — ABNORMAL HIGH (ref 70–99)

## 2013-04-01 LAB — HEMOGLOBIN A1C: Hgb A1c MFr Bld: 5.7 % (ref 4.6–6.5)

## 2013-04-01 LAB — CBC WITH DIFFERENTIAL/PLATELET
Basophils Absolute: 0 K/uL (ref 0.0–0.1)
Basophils Relative: 0.5 % (ref 0.0–3.0)
Eosinophils Absolute: 0.2 K/uL (ref 0.0–0.7)
Eosinophils Relative: 3.5 % (ref 0.0–5.0)
HCT: 34.1 % — ABNORMAL LOW (ref 36.0–46.0)
Hemoglobin: 11.5 g/dL — ABNORMAL LOW (ref 12.0–15.0)
Lymphocytes Relative: 28.4 % (ref 12.0–46.0)
Lymphs Abs: 1.8 K/uL (ref 0.7–4.0)
MCHC: 33.8 g/dL (ref 30.0–36.0)
MCV: 99.8 fl (ref 78.0–100.0)
Monocytes Absolute: 0.8 K/uL (ref 0.1–1.0)
Monocytes Relative: 11.8 % (ref 3.0–12.0)
Neutro Abs: 3.6 K/uL (ref 1.4–7.7)
Neutrophils Relative %: 55.8 % (ref 43.0–77.0)
Platelets: 174 K/uL (ref 150.0–400.0)
RBC: 3.42 Mil/uL — ABNORMAL LOW (ref 3.87–5.11)
RDW: 13.2 % (ref 11.5–14.6)
WBC: 6.5 K/uL (ref 4.5–10.5)

## 2013-04-01 LAB — POCT URINALYSIS DIPSTICK
Bilirubin, UA: NEGATIVE
Blood, UA: NEGATIVE
Glucose, UA: NEGATIVE
Ketones, UA: NEGATIVE
Leukocytes, UA: NEGATIVE
Nitrite, UA: NEGATIVE
Protein, UA: NEGATIVE
Spec Grav, UA: 1.02
Urobilinogen, UA: 0.2
pH, UA: 6

## 2013-04-01 LAB — HEPATIC FUNCTION PANEL
ALT: 15 U/L (ref 0–35)
AST: 14 U/L (ref 0–37)
Albumin: 4 g/dL (ref 3.5–5.2)
Alkaline Phosphatase: 47 U/L (ref 39–117)
Bilirubin, Direct: 0.1 mg/dL (ref 0.0–0.3)
Total Bilirubin: 0.9 mg/dL (ref 0.3–1.2)
Total Protein: 6.9 g/dL (ref 6.0–8.3)

## 2013-04-01 LAB — MICROALBUMIN / CREATININE URINE RATIO: Microalb Creat Ratio: 1.8 mg/g (ref 0.0–30.0)

## 2013-04-01 LAB — LIPID PANEL
Cholesterol: 144 mg/dL (ref 0–200)
HDL: 47 mg/dL (ref >39.00)
Total CHOL/HDL Ratio: 3
Triglycerides: 96 mg/dL (ref 0.0–149.0)

## 2013-04-01 NOTE — Assessment & Plan Note (Signed)
con't meds stable 

## 2013-04-01 NOTE — Progress Notes (Signed)
Subjective:    Emily Daniel is a 79 y.o. female who presents for Medicare Annual/Subsequent preventive examination.  Preventive Screening-Counseling & Management  Tobacco History  Smoking status  . Former Smoker -- 1.00 packs/day for 36 years  . Types: Cigarettes  . Quit date: 02/18/1984  Smokeless tobacco  . Never Used     Problems Prior to Visit 1.   Current Problems (verified) Patient Active Problem List   Diagnosis Date Noted  . B12 DEFICIENCY 06/21/2010  . OSTEOPOROSIS 03/16/2010  . BACK PAIN 01/02/2010  . PVD 04/14/2009  . ARTERIOVENOUS MALFORMATION, COLON 04/14/2009  . CHANGE IN BOWELS 04/14/2009  . DIABETES MELLITUS, TYPE II, UNCONTROLLED 03/13/2009  . ANXIETY STATE, UNSPECIFIED 03/13/2009  . DYSURIA 04/01/2008  . HYPERLIPIDEMIA 05/18/2007  . HYPERTENSION 02/02/2007  . ARTIFICIAL MENOPAUSE 02/02/2007    Medications Prior to Visit Current Outpatient Prescriptions on File Prior to Visit  Medication Sig Dispense Refill  . aspirin 81 MG tablet Take 81 mg by mouth daily.        . Calcium Carbonate-Vitamin D (CALCIUM-VITAMIN D) 600-200 MG-UNIT CAPS Take by mouth 2 (two) times daily.        [name] Sodium 30-100 MG CAPS Take by mouth. As needed for constipation      . glucose blood (ONE TOUCH ULTRA TEST) test strip TEST TWICE DAILY Dx 250.02  100 each  9  . ibuprofen (ADVIL,MOTRIN) 400 MG tablet Take 400 mg by mouth every 6 (six) hours as needed.        . Lancets MISC by Does not apply route.        [name] lisinopril (PRINIVIL,ZESTRIL) 20 MG tablet Take 1 tablet (20 mg total) by mouth daily.  30 tablet  5  . Multiple Vitamin (MULTIVITAMIN) tablet Take 1 tablet by mouth daily.        . simvastatin (ZOCOR) 40 MG tablet TAKE ONE TABLET BY MOUTH ONCE DAILY  30 tablet  5  . sitaGLIPtin (JANUVIA) 100 MG tablet TAKE ONE TABLET BY MOUTH ONE TIME DAILY  30 tablet  5  . vitamin B-12 (CYANOCOBALAMIN) 1000 MCG tablet Take 1,000 mcg by mouth daily.         No  current facility-administered medications on file prior to visit.    Current Medications (verified) Current Outpatient Prescriptions  Medication Sig Dispense Refill  . aspirin 81 MG tablet Take 81 mg by mouth daily.        . Calcium Carbonate-Vitamin D (CALCIUM-VITAMIN D) 600-200 MG-UNIT CAPS Take by mouth 2 (two) times daily.        [name] Sodium 30-100 MG CAPS Take by mouth. As needed for constipation      . glucose blood (ONE TOUCH ULTRA TEST) test strip TEST TWICE DAILY Dx 250.02  100 each  9  . ibuprofen (ADVIL,MOTRIN) 400 MG tablet Take 400 mg by mouth every 6 (six) hours as needed.        . Lancets MISC by Does not apply route.        [name] lisinopril (PRINIVIL,ZESTRIL) 20 MG tablet Take 1 tablet (20 mg total) by mouth daily.  30 tablet  5  . Multiple Vitamin (MULTIVITAMIN) tablet Take 1 tablet by mouth daily.        . simvastatin (ZOCOR) 40 MG tablet TAKE ONE TABLET BY MOUTH ONCE DAILY  30 tablet  5  . sitaGLIPtin (JANUVIA) 100 MG tablet TAKE ONE TABLET BY MOUTH ONE TIME DAILY  30 tablet  5  .  vitamin B-12 (CYANOCOBALAMIN) 1000 MCG tablet Take 1,000 mcg by mouth daily.         No current facility-administered medications for this visit.     Allergies (verified) Meperidine hcl and Sulfonamide derivatives   PAST HISTORY  Family History Family History  Problem Relation Age of Onset  . Cancer Mother     cervical   . Cancer Brother     renal ...x3??  [name] Hypertension Other   . Stroke Other     Social History History  Substance Use Topics  . Smoking status: Former Smoker -- 1.00 packs/day for 36 years    Types: Cigarettes    Quit date: 02/18/1984  . Smokeless tobacco: Never Used  . Alcohol Use: No     Are there smokers in your home (other than you)? No  Risk Factors Current exercise habits: yard work  Dietary issues discussed: na   Cardiac risk factors: advanced age (older than 25 for men, 6 for women), dyslipidemia and hypertension.  Depression  Screen (Note: if answer to either of the following is "Yes", a more complete depression screening is indicated)   Over the past two weeks, have you felt down, depressed or hopeless? No  Over the past two weeks, have you felt little interest or pleasure in doing things? No  Have you lost interest or pleasure in daily life? No  Do you often feel hopeless? No  Do you cry easily over simple problems? No  Activities of Daily Living In your present state of health, do you have any difficulty performing the following activities?:  Driving? No Managing money?  No Feeding yourself? No Getting from bed to chair? No  Climbing a flight of stairs? No Preparing food and eating?: No Bathing or showering? No Getting dressed: No Getting to the toilet? No Using the toilet:No Moving around from place to place: No In the past year have you fallen or had a near fall?:No   Are you sexually active?  Yes  Do you have more than one partner?  No  Hearing Difficulties: Yes Do you often ask people to speak up or repeat themselves? Yes Do you experience ringing or noises in your ears? No Do you have difficulty understanding soft or whispered voices? No   Do you feel that you have a problem with memory? No  Do you often misplace items? No  Do you feel safe at home?  No  Cognitive Testing  Alert? Yes  Normal Appearance?Yes  Oriented to person? Yes  Place? Yes   Time? Yes  Recall of three objects?  Yes  Can perform simple calculations? Yes  Displays appropriate judgment?Yes  Can read the correct time from a watch face?Yes   Advanced Directives have been discussed with the patient? Yes  List the Names of Other Physician/Practitioners you currently use: 1.  Cataract-- Beavis 2. GI-- stark  Indicate any recent Medical Services you may have received from other than [facility] providers in the past year (date may be approximate).  Immunization History  Administered Date(s) Administered  . Influenza Split  03/20/2011, 03/31/2012  . Influenza Whole 03/04/2008, 03/28/2009, 03/16/2010  . Pneumococcal Polysaccharide 03/10/1997  . Tdap 04/16/2011  . Zoster 05/14/2007    Screening Tests Health Maintenance  Topic Date Due  . Influenza Vaccine  12/25/2012  . Hemoglobin A1c  04/04/2013  . Mammogram  05/01/2013  . Foot Exam  10/02/2013  . Urine Microalbumin  10/02/2013  . Ophthalmology Exam  01/13/2014  . Tetanus/tdap  04/15/2021  . Colonoscopy  07/21/2022  . Pneumococcal Polysaccharide Vaccine Age 48 And Over  Completed  . Zostavax  Completed    All answers were reviewed with the patient and necessary referrals were made:  Loreen Freud, DO   04/01/2013   History reviewed:  She  has a past medical history of Hyperlipidemia; Hypertension; Hemorrhoids; AVM (arteriovenous malformation); Diabetes mellitus, type 2; Anxiety; PVD (peripheral vascular disease); Osteoporosis; Vitamin B12 deficiency; and Cataract. She  does not have any pertinent problems on file. She  has past surgical history that includes Appendectomy; Abdominal hysterectomy; and Hemorrhoid surgery. Her family history includes Cancer in her brother and mother; Hypertension in her other; Stroke in her other. She  reports that she quit smoking about 29 years ago. Her smoking use included Cigarettes. She has a 36 pack-year smoking history. She has never used smokeless tobacco. She reports that she does not drink alcohol or use illicit drugs. She has a current medication list which includes the following prescription(s): aspirin, calcium-vitamin d, casanthranol-docusate sodium, glucose blood, ibuprofen, lancets, lisinopril, multivitamin, simvastatin, sitagliptin, and vitamin b-12. Current Outpatient Prescriptions on File Prior to Visit  Medication Sig Dispense Refill  . aspirin 81 MG tablet Take 81 mg by mouth daily.        . Calcium Carbonate-Vitamin D (CALCIUM-VITAMIN D) 600-200 MG-UNIT CAPS Take by mouth 2 (two) times daily.        [name] Sodium 30-100 MG CAPS Take by mouth. As needed for constipation      . glucose blood (ONE TOUCH ULTRA TEST) test strip TEST TWICE DAILY Dx 250.02  100 each  9  . ibuprofen (ADVIL,MOTRIN) 400 MG tablet Take 400 mg by mouth every 6 (six) hours as needed.        . Lancets MISC by Does not apply route.        [name] lisinopril (PRINIVIL,ZESTRIL) 20 MG tablet Take 1 tablet (20 mg total) by mouth daily.  30 tablet  5  . Multiple Vitamin (MULTIVITAMIN) tablet Take 1 tablet by mouth daily.        . simvastatin (ZOCOR) 40 MG tablet TAKE ONE TABLET BY MOUTH ONCE DAILY  30 tablet  5  . sitaGLIPtin (JANUVIA) 100 MG tablet TAKE ONE TABLET BY MOUTH ONE TIME DAILY  30 tablet  5  . vitamin B-12 (CYANOCOBALAMIN) 1000 MCG tablet Take 1,000 mcg by mouth daily.         No current facility-administered medications on file prior to visit.   She is allergic to meperidine hcl and sulfonamide derivatives.  Review of Systems  Review of Systems  Constitutional: Negative for activity change, appetite change and fatigue.  HENT: Negative for hearing loss, congestion, tinnitus and ear discharge.   Eyes: Negative for visual disturbance (see optho q1y -- vision corrected to 20/20 with glasses).  Respiratory: Negative for cough, chest tightness and shortness of breath.   Cardiovascular: Negative for chest pain, palpitations and leg swelling.  Gastrointestinal: Negative for abdominal pain, diarrhea, constipation and abdominal distention.  Genitourinary: Negative for urgency, frequency, decreased urine volume and difficulty urinating.  Musculoskeletal: Negative for back pain, arthralgias and gait problem.  Skin: Negative for color change, pallor and rash.  Neurological: Negative for dizziness, light-headedness, numbness and headaches.  Hematological: Negative for adenopathy. Does not bruise/bleed easily.  Psychiatric/Behavioral: Negative for suicidal ideas, confusion, sleep disturbance, self-injury,  dysphoric mood, decreased concentration and agitation.  Pt is able to read and write and can do all ADLs No risk  for falling No abuse/ violence in home     Objective:     Vision by Snellen chart: opth  Body mass index is 26 kg/(m^2). BP 150/76  Pulse 88  Temp(Src) 98.6 F (37 C) (Oral)  Ht 5' 0.5" (1.537 m)  Wt 135 lb 6.4 oz (61.417 kg)  BMI 26.00 kg/m2  SpO2 95%  BP 150/76  Pulse 88  Temp(Src) 98.6 F (37 C) (Oral)  Ht 5' 0.5" (1.537 m)  Wt 135 lb 6.4 oz (61.417 kg)  BMI 26.00 kg/m2  SpO2 95% General appearance: alert, cooperative, appears stated age and no distress Head: Normocephalic, without obvious abnormality, atraumatic Eyes: negative findings: lids and lashes normal and pupils equal, round, reactive to light and accomodation Ears: normal TM's and external ear canals both ears Nose: Nares normal. Septum midline. Mucosa normal. No drainage or sinus tenderness. Throat: lips, mucosa, and tongue normal; teeth and gums normal Neck: no adenopathy, no carotid bruit, no JVD, supple, symmetrical, trachea midline and thyroid not enlarged, symmetric, no tenderness/mass/nodules Back: symmetric, no curvature. ROM normal. No CVA tenderness. Lungs: clear to auscultation bilaterally Breasts: normal appearance, no masses or tenderness Heart: S1, S2 normal Abdomen: soft, non-tender; bowel sounds normal; no masses,  no organomegaly Pelvic: not indicated; post-menopausal, no abnormal Pap smears in past Extremities: extremities normal, atraumatic, no cyanosis or edema Pulses: 2+ and symmetric Skin: Skin color, texture, turgor normal. No rashes or lesions Lymph nodes: Cervical, supraclavicular, and axillary nodes normal. Neurologic: Alert and oriented X 3, normal strength and tone. Normal symmetric reflexes. Normal coordination and gait Psych- no anxiety, no depression      Assessment:     cpe      Plan:     During the course of the visit the patient was educated and  counseled about appropriate screening and preventive services including:    Pneumococcal vaccine   Influenza vaccine  Td vaccine  Screening mammography  Bone densitometry screening  Advanced directives: has an advanced directive - a copy HAS NOT been provided.  Diet review for nutrition referral? Yes ____  Not Indicated __x__   Patient Instructions (the written plan) was given to the patient.  Medicare Attestation I have personally reviewed: The patient's medical and social history Their use of alcohol, tobacco or illicit drugs Their current medications and supplements The patient's functional ability including ADLs,fall risks, home safety risks, cognitive, and hearing and visual impairment Diet and physical activities Evidence for depression or mood disorders  The patient's weight, height, BMI, and visual acuity have been recorded in the chart.  I have made referrals, counseling, and provided education to the patient based on review of the above and I have provided the patient with a written personalized care plan for preventive services.     Loreen Freud, DO   04/01/2013

## 2013-04-01 NOTE — Patient Instructions (Addendum)
Get Debrox drops at the pharmacy for the ear wax.  F/u with our office if symptoms do not subside in 1 week. Otherwise f/u 6 months     Preventive Care for Adults, Female A healthy lifestyle and preventive care can promote health and wellness. Preventive health guidelines for women include the following key practices.  A routine yearly physical is a good way to check with your caregiver about your health and preventive screening. It is a chance to share any concerns and updates on your health, and to receive a thorough exam.  Visit your dentist for a routine exam and preventive care every 6 months. Brush your teeth twice a day and floss once a day. Good oral hygiene prevents tooth decay and gum disease.  The frequency of eye exams is based on your age, health, family medical history, use of contact lenses, and other factors. Follow your caregiver's recommendations for frequency of eye exams.  Eat a healthy diet. Foods like vegetables, fruits, whole grains, low-fat dairy products, and lean protein foods contain the nutrients you need without too many calories. Decrease your intake of foods high in solid fats, added sugars, and salt. Eat the right amount of calories for you.Get information about a proper diet from your caregiver, if necessary.  Regular physical exercise is one of the most important things you can do for your health. Most adults should get at least 150 minutes of moderate-intensity exercise (any activity that increases your heart rate and causes you to sweat) each week. In addition, most adults need muscle-strengthening exercises on 2 or more days a week.  Maintain a healthy weight. The body mass index (BMI) is a screening tool to identify possible weight problems. It provides an estimate of body fat based on height and weight. Your caregiver can help determine your BMI, and can help you achieve or maintain a healthy weight.For adults 20 years and older:  A BMI below 18.5 is  considered underweight.  A BMI of 18.5 to 24.9 is normal.  A BMI of 25 to 29.9 is considered overweight.  A BMI of 30 and above is considered obese.  Maintain normal blood lipids and cholesterol levels by exercising and minimizing your intake of saturated fat. Eat a balanced diet with plenty of fruit and vegetables. Blood tests for lipids and cholesterol should begin at age 36 and be repeated every 5 years. If your lipid or cholesterol levels are high, you are over 50, or you are at high risk for heart disease, you may need your cholesterol levels checked more frequently.Ongoing high lipid and cholesterol levels should be treated with medicines if diet and exercise are not effective.  If you smoke, find out from your caregiver how to quit. If you do not use tobacco, do not start.  Lung cancer screening is recommended for adults aged 66 80 years who are at high risk for developing lung cancer because of a history of smoking. Yearly low-dose computed tomography (CT) is recommended for people who have at least a 30-pack-year history of smoking and are a current smoker or have quit within the past 15 years. A pack year of smoking is smoking an average of 1 pack of cigarettes a day for 1 year (for example: 1 pack a day for 30 years or 2 packs a day for 15 years). Yearly screening should continue until the smoker has stopped smoking for at least 15 years. Yearly screening should also be stopped for people who develop a health problem  that would prevent them from having lung cancer treatment.  If you are pregnant, do not drink alcohol. If you are breastfeeding, be very cautious about drinking alcohol. If you are not pregnant and choose to drink alcohol, do not exceed 1 drink per day. One drink is considered to be 12 ounces (355 mL) of beer, 5 ounces (148 mL) of wine, or 1.5 ounces (44 mL) of liquor.  Avoid use of street drugs. Do not share needles with anyone. Ask for help if you need support or  instructions about stopping the use of drugs.  High blood pressure causes heart disease and increases the risk of stroke. Your blood pressure should be checked at least every 1 to 2 years. Ongoing high blood pressure should be treated with medicines if weight loss and exercise are not effective.  If you are 69 to 77 years old, ask your caregiver if you should take aspirin to prevent strokes.  Diabetes screening involves taking a blood sample to check your fasting blood sugar level. This should be done once every 3 years, after age 70, if you are within normal weight and without risk factors for diabetes. Testing should be considered at a younger age or be carried out more frequently if you are overweight and have at least 1 risk factor for diabetes.  Breast cancer screening is essential preventive care for women. You should practice "breast self-awareness." This means understanding the normal appearance and feel of your breasts and may include breast self-examination. Any changes detected, no matter how small, should be reported to a caregiver. Women in their 65s and 30s should have a clinical breast exam (CBE) by a caregiver as part of a regular health exam every 1 to 3 years. After age 10, women should have a CBE every year. Starting at age 35, women should consider having a mammography (breast X-ray test) every year. Women who have a family history of breast cancer should talk to their caregiver about genetic screening. Women at a high risk of breast cancer should talk to their caregivers about having magnetic resonance imaging (MRI) and a mammography every year.  Breast cancer gene (BRCA)-related cancer risk assessment is recommended for women who have family members with BRCA-related cancers. BRCA-related cancers include breast, ovarian, tubal, and peritoneal cancers. Having family members with these cancers may be associated with an increased risk for harmful changes (mutations) in the breast cancer  genes BRCA1 and BRCA2. Results of the assessment will determine the need for genetic counseling and BRCA1 and BRCA2 testing.  The Pap test is a screening test for cervical cancer. A Pap test can show cell changes on the cervix that might become cervical cancer if left untreated. A Pap test is a procedure in which cells are obtained and examined from the lower end of the uterus (cervix).  Women should have a Pap test starting at age 82.  Between ages 11 and 19, Pap tests should be repeated every 2 years.  Beginning at age 66, you should have a Pap test every 3 years as long as the past 3 Pap tests have been normal.  Some women have medical problems that increase the chance of getting cervical cancer. Talk to your caregiver about these problems. It is especially important to talk to your caregiver if a new problem develops soon after your last Pap test. In these cases, your caregiver may recommend more frequent screening and Pap tests.  The above recommendations are the same for women who have or  have not gotten the vaccine for human papillomavirus (HPV).  If you had a hysterectomy for a problem that was not cancer or a condition that could lead to cancer, then you no longer need Pap tests. Even if you no longer need a Pap test, a regular exam is a good idea to make sure no other problems are starting.  If you are between ages 74 and 26, and you have had normal Pap tests going back 10 years, you no longer need Pap tests. Even if you no longer need a Pap test, a regular exam is a good idea to make sure no other problems are starting.  If you have had past treatment for cervical cancer or a condition that could lead to cancer, you need Pap tests and screening for cancer for at least 20 years after your treatment.  If Pap tests have been discontinued, risk factors (such as a new sexual partner) need to be reassessed to determine if screening should be resumed.  The HPV test is an additional test  that may be used for cervical cancer screening. The HPV test looks for the virus that can cause the cell changes on the cervix. The cells collected during the Pap test can be tested for HPV. The HPV test could be used to screen women aged 32 years and older, and should be used in women of any age who have unclear Pap test results. After the age of 19, women should have HPV testing at the same frequency as a Pap test.  Colorectal cancer can be detected and often prevented. Most routine colorectal cancer screening begins at the age of 62 and continues through age 35. However, your caregiver may recommend screening at an earlier age if you have risk factors for colon cancer. On a yearly basis, your caregiver may provide home test kits to check for hidden blood in the stool. Use of a small camera at the end of a tube, to directly examine the colon (sigmoidoscopy or colonoscopy), can detect the earliest forms of colorectal cancer. Talk to your caregiver about this at age 2, when routine screening begins. Direct examination of the colon should be repeated every 5 to 10 years through age 6, unless early forms of pre-cancerous polyps or small growths are found.  Hepatitis C blood testing is recommended for all people born from 1 through 1965 and any individual with known risks for hepatitis C.  Practice safe sex. Use condoms and avoid high-risk sexual practices to reduce the spread of sexually transmitted infections (STIs). STIs include gonorrhea, chlamydia, syphilis, trichomonas, herpes, HPV, and human immunodeficiency virus (HIV). Herpes, HIV, and HPV are viral illnesses that have no cure. They can result in disability, cancer, and death. Sexually active women aged 51 and younger should be checked for chlamydia. Older women with new or multiple partners should also be tested for chlamydia. Testing for other STIs is recommended if you are sexually active and at increased risk.  Osteoporosis is a disease in  which the bones lose minerals and strength with aging. This can result in serious bone fractures. The risk of osteoporosis can be identified using a bone density scan. Women ages 57 and over and women at risk for fractures or osteoporosis should discuss screening with their caregivers. Ask your caregiver whether you should take a calcium supplement or vitamin D to reduce the rate of osteoporosis.  Menopause can be associated with physical symptoms and risks. Hormone replacement therapy is available to decrease symptoms and  risks. You should talk to your caregiver about whether hormone replacement therapy is right for you.  Use sunscreen. Apply sunscreen liberally and repeatedly throughout the day. You should seek shade when your shadow is shorter than you. Protect yourself by wearing long sleeves, pants, a wide-brimmed hat, and sunglasses year round, whenever you are outdoors.  Once a month, do a whole body skin exam, using a mirror to look at the skin on your back. Notify your caregiver of new moles, moles that have irregular borders, moles that are larger than a pencil eraser, or moles that have changed in shape or color.  Stay current with required immunizations.  Influenza vaccine. All adults should be immunized every year.  Tetanus, diphtheria, and acellular pertussis (Td, Tdap) vaccine. Pregnant women should receive 1 dose of Tdap vaccine during each pregnancy. The dose should be obtained regardless of the length of time since the last dose. Immunization is preferred during the 27th to 36th week of gestation. An adult who has not previously received Tdap or who does not know her vaccine status should receive 1 dose of Tdap. This initial dose should be followed by tetanus and diphtheria toxoids (Td) booster doses every 10 years. Adults with an unknown or incomplete history of completing a 3-dose immunization series with Td-containing vaccines should begin or complete a primary immunization series  including a Tdap dose. Adults should receive a Td booster every 10 years.  Varicella vaccine. An adult without evidence of immunity to varicella should receive 2 doses or a second dose if she has previously received 1 dose. Pregnant females who do not have evidence of immunity should receive the first dose after pregnancy. This first dose should be obtained before leaving the health care facility. The second dose should be obtained 4 8 weeks after the first dose.  Human papillomavirus (HPV) vaccine. Females aged 75 26 years who have not received the vaccine previously should obtain the 3-dose series. The vaccine is not recommended for use in pregnant females. However, pregnancy testing is not needed before receiving a dose. If a female is found to be pregnant after receiving a dose, no treatment is needed. In that case, the remaining doses should be delayed until after the pregnancy. Immunization is recommended for any person with an immunocompromised condition through the age of 26 years if she did not get any or all doses earlier. During the 3-dose series, the second dose should be obtained 4 8 weeks after the first dose. The third dose should be obtained 24 weeks after the first dose and 16 weeks after the second dose.  Zoster vaccine. One dose is recommended for adults aged 34 years or older unless certain conditions are present.  Measles, mumps, and rubella (MMR) vaccine. Adults born before [birth year] generally are considered immune to measles and mumps. Adults born in [birth year] or later should have 1 or more doses of MMR vaccine unless there is a contraindication to the vaccine or there is laboratory evidence of immunity to each of the three diseases. A routine second dose of MMR vaccine should be obtained at least 28 days after the first dose for students attending postsecondary schools, health care workers, or international travelers. People who received inactivated measles vaccine or an unknown type of measles  vaccine during 1963 1967 should receive 2 doses of MMR vaccine. People who received inactivated mumps vaccine or an unknown type of mumps vaccine before 1979 and are at high risk for mumps infection should consider immunization with 2  doses of MMR vaccine. For females of childbearing age, rubella immunity should be determined. If there is no evidence of immunity, females who are not pregnant should be vaccinated. If there is no evidence of immunity, females who are pregnant should delay immunization until after pregnancy. Unvaccinated health care workers born before [birth year] who lack laboratory evidence of measles, mumps, or rubella immunity or laboratory confirmation of disease should consider measles and mumps immunization with 2 doses of MMR vaccine or rubella immunization with 1 dose of MMR vaccine.  Pneumococcal 13-valent conjugate (PCV13) vaccine. When indicated, a person who is uncertain of her immunization history and has no record of immunization should receive the PCV13 vaccine. An adult aged 68 years or older who has certain medical conditions and has not been previously immunized should receive 1 dose of PCV13 vaccine. This PCV13 should be followed with a dose of pneumococcal polysaccharide (PPSV23) vaccine. The PPSV23 vaccine dose should be obtained at least 8 weeks after the dose of PCV13 vaccine. An adult aged 30 years or older who has certain medical conditions and previously received 1 or more doses of PPSV23 vaccine should receive 1 dose of PCV13. The PCV13 vaccine dose should be obtained 1 or more years after the last PPSV23 vaccine dose.  Pneumococcal polysaccharide (PPSV23) vaccine. When PCV13 is also indicated, PCV13 should be obtained first. All adults aged 47 years and older should be immunized. An adult younger than age 60 years who has certain medical conditions should be immunized. Any person who resides in a nursing home or long-term care facility should be immunized. An adult smoker  should be immunized. People with an immunocompromised condition and certain other conditions should receive both PCV13 and PPSV23 vaccines. People with human immunodeficiency virus (HIV) infection should be immunized as soon as possible after diagnosis. Immunization during chemotherapy or radiation therapy should be avoided. Routine use of PPSV23 vaccine is not recommended for American Indians, [address], or people younger than 65 years unless there are medical conditions that require PPSV23 vaccine. When indicated, people who have unknown immunization and have no record of immunization should receive PPSV23 vaccine. One-time revaccination 5 years after the first dose of PPSV23 is recommended for people aged 45 64 years who have chronic kidney failure, nephrotic syndrome, asplenia, or immunocompromised conditions. People who received 1 2 doses of PPSV23 before age 30 years should receive another dose of PPSV23 vaccine at age 80 years or later if at least 5 years have passed since the previous dose. Doses of PPSV23 are not needed for people immunized with PPSV23 at or after age 64 years.  Meningococcal vaccine. Adults with asplenia or persistent complement component deficiencies should receive 2 doses of quadrivalent meningococcal conjugate (MenACWY-D) vaccine. The doses should be obtained at least 2 months apart. Microbiologists working with certain meningococcal bacteria, military recruits, people at risk during an outbreak, and people who travel to or live in countries with a high rate of meningitis should be immunized. A first-year college student up through age 16 years who is living in a residence hall should receive a dose if she did not receive a dose on or after her 16th birthday. Adults who have certain high-risk conditions should receive one or more doses of vaccine.  Hepatitis A vaccine. Adults who wish to be protected from this disease, have certain high-risk conditions, work with hepatitis  A-infected animals, work in hepatitis A research labs, or travel to or work in countries with a high rate of hepatitis A  should be immunized. Adults who were previously unvaccinated and who anticipate close contact with an international adoptee during the first 60 days after arrival in the [place] from a country with a high rate of hepatitis A should be immunized.  Hepatitis B vaccine. Adults who wish to be protected from this disease, have certain high-risk conditions, may be exposed to blood or other infectious body fluids, are household contacts or sex partners of hepatitis B positive people, are clients or workers in certain care facilities, or travel to or work in countries with a high rate of hepatitis B should be immunized.  Haemophilus influenzae type b (Hib) vaccine. A previously unvaccinated person with asplenia or sickle cell disease or having a scheduled splenectomy should receive 1 dose of Hib vaccine. Regardless of previous immunization, a recipient of a hematopoietic stem cell transplant should receive a 3-dose series 6 12 months after her successful transplant. Hib vaccine is not recommended for adults with HIV infection. Preventive Services / Frequency Ages 56 to 39  Blood pressure check.** / Every 1 to 2 years.  Lipid and cholesterol check.** / Every 5 years beginning at age 42.  Clinical breast exam.** / Every 3 years for women in their 89s and 30s.  BRCA-related cancer risk assessment.** / For women who have family members with a BRCA-related cancer (breast, ovarian, tubal, or peritoneal cancers).  Pap test.** / Every 2 years from ages 40 through 74. Every 3 years starting at age 66 through age 37 or 41 with a history of 3 consecutive normal Pap tests.  HPV screening.** / Every 3 years from ages 69 through ages 64 to 74 with a history of 3 consecutive normal Pap tests.  Hepatitis C blood test.** / For any individual with known risks for hepatitis C.  Skin self-exam. /  Monthly.  Influenza vaccine. / Every year.  Tetanus, diphtheria, and acellular pertussis (Tdap, Td) vaccine.** / Consult your caregiver. Pregnant women should receive 1 dose of Tdap vaccine during each pregnancy. 1 dose of Td every 10 years.  Varicella vaccine.** / Consult your caregiver. Pregnant females who do not have evidence of immunity should receive the first dose after pregnancy.  HPV vaccine. / 3 doses over 6 months, if 26 and younger. The vaccine is not recommended for use in pregnant females. However, pregnancy testing is not needed before receiving a dose.  Measles, mumps, rubella (MMR) vaccine.** / You need at least 1 dose of MMR if you were born in 1957 or later. You may also need a 2nd dose. For females of childbearing age, rubella immunity should be determined. If there is no evidence of immunity, females who are not pregnant should be vaccinated. If there is no evidence of immunity, females who are pregnant should delay immunization until after pregnancy.  Pneumococcal 13-valent conjugate (PCV13) vaccine.** / Consult your caregiver.  Pneumococcal polysaccharide (PPSV23) vaccine.** / 1 to 2 doses if you smoke cigarettes or if you have certain conditions.  Meningococcal vaccine.** / 1 dose if you are age 37 to 59 years and a Orthoptist living in a residence hall, or have one of several medical conditions, you need to get vaccinated against meningococcal disease. You may also need additional booster doses.  Hepatitis A vaccine.** / Consult your caregiver.  Hepatitis B vaccine.** / Consult your caregiver.  Haemophilus influenzae type b (Hib) vaccine.** / Consult your caregiver. Ages 9 to 77  Blood pressure check.** / Every 1 to 2 years.  Lipid and cholesterol  check.** / Every 5 years beginning at age 39.  Lung cancer screening. / Every year if you are aged 12 80 years and have a 30-pack-year history of smoking and currently smoke or have quit within the past  15 years. Yearly screening is stopped once you have quit smoking for at least 15 years or develop a health problem that would prevent you from having lung cancer treatment.  Clinical breast exam.** / Every year after age 80.  BRCA-related cancer risk assessment.** / For women who have family members with a BRCA-related cancer (breast, ovarian, tubal, or peritoneal cancers).  Mammogram.** / Every year beginning at age 84 and continuing for as long as you are in good health. Consult with your caregiver.  Pap test.** / Every 3 years starting at age 98 through age 67 or 20 with a history of 3 consecutive normal Pap tests.  HPV screening.** / Every 3 years from ages 37 through ages 41 to 55 with a history of 3 consecutive normal Pap tests.  Fecal occult blood test (FOBT) of stool. / Every year beginning at age 45 and continuing until age 47. You may not need to do this test if you get a colonoscopy every 10 years.  Flexible sigmoidoscopy or colonoscopy.** / Every 5 years for a flexible sigmoidoscopy or every 10 years for a colonoscopy beginning at age 19 and continuing until age 82.  Hepatitis C blood test.** / For all people born from [birth year] through 1963 and any individual with known risks for hepatitis C.  Skin self-exam. / Monthly.  Influenza vaccine. / Every year.  Tetanus, diphtheria, and acellular pertussis (Tdap/Td) vaccine.** / Consult your caregiver. Pregnant women should receive 1 dose of Tdap vaccine during each pregnancy. 1 dose of Td every 10 years.  Varicella vaccine.** / Consult your caregiver. Pregnant females who do not have evidence of immunity should receive the first dose after pregnancy.  Zoster vaccine.** / 1 dose for adults aged 90 years or older.  Measles, mumps, rubella (MMR) vaccine.** / You need at least 1 dose of MMR if you were born in 1957 or later. You may also need a 2nd dose. For females of childbearing age, rubella immunity should be determined. If there is no  evidence of immunity, females who are not pregnant should be vaccinated. If there is no evidence of immunity, females who are pregnant should delay immunization until after pregnancy.  Pneumococcal 13-valent conjugate (PCV13) vaccine.** / Consult your caregiver.  Pneumococcal polysaccharide (PPSV23) vaccine.** / 1 to 2 doses if you smoke cigarettes or if you have certain conditions.  Meningococcal vaccine.** / Consult your caregiver.  Hepatitis A vaccine.** / Consult your caregiver.  Hepatitis B vaccine.** / Consult your caregiver.  Haemophilus influenzae type b (Hib) vaccine.** / Consult your caregiver. Ages 25 and over  Blood pressure check.** / Every 1 to 2 years.  Lipid and cholesterol check.** / Every 5 years beginning at age 60.  Lung cancer screening. / Every year if you are aged 58 80 years and have a 30-pack-year history of smoking and currently smoke or have quit within the past 15 years. Yearly screening is stopped once you have quit smoking for at least 15 years or develop a health problem that would prevent you from having lung cancer treatment.  Clinical breast exam.** / Every year after age 2.  BRCA-related cancer risk assessment.** / For women who have family members with a BRCA-related cancer (breast, ovarian, tubal, or peritoneal cancers).  Mammogram.** /  Every year beginning at age 63 and continuing for as long as you are in good health. Consult with your caregiver.  Pap test.** / Every 3 years starting at age 31 through age 2 or 24 with a 3 consecutive normal Pap tests. Testing can be stopped between 65 and 70 with 3 consecutive normal Pap tests and no abnormal Pap or HPV tests in the past 10 years.  HPV screening.** / Every 3 years from ages 69 through ages 61 or 63 with a history of 3 consecutive normal Pap tests. Testing can be stopped between 65 and 70 with 3 consecutive normal Pap tests and no abnormal Pap or HPV tests in the past 10 years.  Fecal occult  blood test (FOBT) of stool. / Every year beginning at age 13 and continuing until age 38. You may not need to do this test if you get a colonoscopy every 10 years.  Flexible sigmoidoscopy or colonoscopy.** / Every 5 years for a flexible sigmoidoscopy or every 10 years for a colonoscopy beginning at age 41 and continuing until age 9.  Hepatitis C blood test.** / For all people born from [birth year] through 1963 and any individual with known risks for hepatitis C.  Osteoporosis screening.** / A one-time screening for women ages 83 and over and women at risk for fractures or osteoporosis.  Skin self-exam. / Monthly.  Influenza vaccine. / Every year.  Tetanus, diphtheria, and acellular pertussis (Tdap/Td) vaccine.** / 1 dose of Td every 10 years.  Varicella vaccine.** / Consult your caregiver.  Zoster vaccine.** / 1 dose for adults aged 75 years or older.  Pneumococcal 13-valent conjugate (PCV13) vaccine.** / Consult your caregiver.  Pneumococcal polysaccharide (PPSV23) vaccine.** / 1 dose for all adults aged 68 years and older.  Meningococcal vaccine.** / Consult your caregiver.  Hepatitis A vaccine.** / Consult your caregiver.  Hepatitis B vaccine.** / Consult your caregiver.  Haemophilus influenzae type b (Hib) vaccine.** / Consult your caregiver. ** Family history and personal history of risk and conditions may change your caregiver's recommendations. Document Released: 07/09/2001 Document Revised: 09/07/2012 Document Reviewed: 10/08/2010 [facility] Patient Information 2014 [place], [place].

## 2013-04-01 NOTE — Assessment & Plan Note (Signed)
Check labs con't meds 

## 2013-04-06 ENCOUNTER — Encounter: Payer: Self-pay | Admitting: *Deleted

## 2013-05-04 LAB — HM DEXA SCAN

## 2013-05-04 LAB — HM MAMMOGRAPHY: HM Mammogram: NEGATIVE

## 2013-05-17 ENCOUNTER — Other Ambulatory Visit: Payer: Self-pay | Admitting: Family Medicine

## 2013-06-17 ENCOUNTER — Other Ambulatory Visit: Payer: Self-pay | Admitting: Family Medicine

## 2013-08-03 ENCOUNTER — Encounter: Payer: Self-pay | Admitting: Family Medicine

## 2013-08-11 ENCOUNTER — Telehealth: Payer: Self-pay

## 2013-08-11 NOTE — Telephone Encounter (Signed)
Spoke with patient and reviewed her BMD results. Patient has osteoporosis, she has tried Fosamax in the past and she could not tolerate the medications, I discussed Prolia and reclast and she denied them both, she said at this point she does not want to take any medication. We discussed Fall risk and fracture risk and she said she understood and said she has been and will continue to be careful but she does not want any medication to treat her Osteoporosis.       KP

## 2013-08-12 NOTE — Telephone Encounter (Signed)
noted 

## 2013-09-06 ENCOUNTER — Telehealth: Payer: Self-pay | Admitting: Family Medicine

## 2013-09-06 NOTE — Telephone Encounter (Signed)
Tried calling the patient but the line was busy.    KP 

## 2013-09-06 NOTE — Telephone Encounter (Signed)
Patient can no longer afford diabetes medication. I have looked and we do not have any samples of Januvia 100mg . Can this be changed to something different? Please advise.

## 2013-09-06 NOTE — Telephone Encounter (Signed)
Patient called stating she cannot take Januvia anymore because she can't afford the prescription. She would like to know if she can switch back to metformin. She states she was taking the metformin incorrectly in the past and thinks it may work this time if she takes it in the morning. [facility] [address].

## 2013-09-06 NOTE — Telephone Encounter (Signed)
D/c januvia amaryl 2 mg #30  1 po qd,  2 refills--- warm pt it can drop sugars low Labs in 2-3 months     250.00 lipid, hep, bmp, hgba1c

## 2013-09-07 MED ORDER — METFORMIN HCL ER 500 MG PO TB24: ORAL_TABLET | ORAL | Status: DC

## 2013-09-07 NOTE — Telephone Encounter (Signed)
Metformin xr  500mg   1 po qpm ----  #30  2 refills and recheck labs 3 months

## 2013-09-07 NOTE — Telephone Encounter (Signed)
Patient prefers Metformin over the Januvia.  Please advise.

## 2013-09-07 NOTE — Telephone Encounter (Signed)
Med filled. [facility] [address].    Tried calling to make patient aware but line was busy.

## 2013-09-07 NOTE — Telephone Encounter (Signed)
Tried calling again, no answer.  Message left making her aware that her prescription had been sent to the pharmacy.

## 2013-09-09 ENCOUNTER — Telehealth: Payer: Self-pay

## 2013-09-09 NOTE — Telephone Encounter (Signed)
Patient was made aware that her labs including her kidney function is being monitored routinely.  She was encouraged to take the Metformin as prescribed and adjustments will be made by the provider as needed.  Patient stated understanding and agreed to take medication after completing her Januvia (patient stated she had 4 pills left).  She was encouraged to call with further questions and concerns.

## 2013-09-09 NOTE — Telephone Encounter (Signed)
We check kidney function every time she gets labs

## 2013-09-09 NOTE — Telephone Encounter (Addendum)
Patient stated she was reading her pill bottle information regarding Metformin and it stated not to take the medication if you are over the age of  78 yrs and have not completed a kidney function test.  Please advise.

## 2013-09-30 ENCOUNTER — Ambulatory Visit (INDEPENDENT_AMBULATORY_CARE_PROVIDER_SITE_OTHER): Payer: Medicare Other | Admitting: Family Medicine

## 2013-09-30 ENCOUNTER — Encounter: Payer: Self-pay | Admitting: Family Medicine

## 2013-09-30 VITALS — BP 130/72 | HR 98 | Temp 98.8°F | Wt 132.0 lb

## 2013-09-30 DIAGNOSIS — I798 Other disorders of arteries, arterioles and capillaries in diseases classified elsewhere: Secondary | ICD-10-CM

## 2013-09-30 DIAGNOSIS — I10 Essential (primary) hypertension: Secondary | ICD-10-CM

## 2013-09-30 DIAGNOSIS — E785 Hyperlipidemia, unspecified: Secondary | ICD-10-CM

## 2013-09-30 DIAGNOSIS — H6120 Impacted cerumen, unspecified ear: Secondary | ICD-10-CM

## 2013-09-30 DIAGNOSIS — E1151 Type 2 diabetes mellitus with diabetic peripheral angiopathy without gangrene: Secondary | ICD-10-CM

## 2013-09-30 DIAGNOSIS — E1159 Type 2 diabetes mellitus with other circulatory complications: Secondary | ICD-10-CM

## 2013-09-30 LAB — HEPATIC FUNCTION PANEL
ALT: 18 U/L (ref 0–35)
AST: 17 U/L (ref 0–37)
Albumin: 3.9 g/dL (ref 3.5–5.2)
Alkaline Phosphatase: 42 U/L (ref 39–117)
BILIRUBIN TOTAL: 0.8 mg/dL (ref 0.2–1.2)
Bilirubin, Direct: 0.1 mg/dL (ref 0.0–0.3)
Total Protein: 6.7 g/dL (ref 6.0–8.3)

## 2013-09-30 LAB — BASIC METABOLIC PANEL
BUN: 17 mg/dL (ref 6–23)
CHLORIDE: 110 meq/L (ref 96–112)
CO2: 25 mEq/L (ref 19–32)
CREATININE: 1.1 mg/dL (ref 0.4–1.2)
Calcium: 9 mg/dL (ref 8.4–10.5)
GFR: 50.58 mL/min — ABNORMAL LOW (ref >60.00)
Glucose, Bld: 119 mg/dL — ABNORMAL HIGH (ref 70–99)
POTASSIUM: 3.9 meq/L (ref 3.5–5.1)
Sodium: 140 mEq/L (ref 135–145)

## 2013-09-30 LAB — LIPID PANEL
CHOL/HDL RATIO: 3
Cholesterol: 121 mg/dL (ref 0–200)
HDL: 44.4 mg/dL (ref >39.00)
LDL CALC: 61 mg/dL (ref 0–99)
TRIGLYCERIDES: 76 mg/dL (ref 0.0–149.0)
VLDL: 15.2 mg/dL (ref 0.0–40.0)

## 2013-09-30 LAB — POCT URINALYSIS DIPSTICK
Bilirubin, UA: NEGATIVE
GLUCOSE UA: NEGATIVE
Ketones, UA: NEGATIVE
Leukocytes, UA: NEGATIVE
NITRITE UA: NEGATIVE
Protein, UA: NEGATIVE
RBC UA: NEGATIVE
Spec Grav, UA: 1.025
Urobilinogen, UA: 0.2
pH, UA: 6

## 2013-09-30 LAB — HEMOGLOBIN A1C: Hgb A1c MFr Bld: 5.9 % (ref 4.6–6.5)

## 2013-09-30 NOTE — Progress Notes (Signed)
Pre visit review using our clinic review tool, if applicable. No additional management support is needed unless otherwise documented below in the visit note. 

## 2013-09-30 NOTE — Progress Notes (Signed)
   Subjective:    Patient ID: Emily Daniel, female    DOB: 1929-12-29, 80 y.o.   MRN: [id number]  Diabetes Pertinent negatives for hypoglycemia include no dizziness or headaches. Pertinent negatives for diabetes include no chest pain, no fatigue, no polydipsia, no polyphagia and no polyuria.      Review of Systems  Constitutional: Negative for diaphoresis, appetite change, fatigue and unexpected weight change.  Eyes: Negative for pain, redness and visual disturbance.  Respiratory: Negative for cough, chest tightness, shortness of breath and wheezing.   Cardiovascular: Negative for chest pain, palpitations and leg swelling.  Endocrine: Negative for cold intolerance, heat intolerance, polydipsia, polyphagia and polyuria.  Genitourinary: Negative for dysuria, frequency and difficulty urinating.  Neurological: Negative for dizziness, light-headedness, numbness and headaches.       Objective:   Physical Exam  Constitutional: She is oriented to person, place, and time. She appears well-developed and well-nourished.  HENT:  Head: Normocephalic and atraumatic.  Eyes: Conjunctivae and EOM are normal.  Neck: Normal range of motion. Neck supple. No JVD present. Carotid bruit is not present. No thyromegaly present.  Cardiovascular: Normal rate, regular rhythm and normal heart sounds.   No murmur heard. Pulmonary/Chest: Effort normal and breath sounds normal. No respiratory distress. She has no wheezes. She has no rales. She exhibits no tenderness.  Musculoskeletal: She exhibits no edema.  Neurological: She is alert and oriented to person, place, and time.  Psychiatric: She has a normal mood and affect.  ears-- cerumen impaction        Assessment & Plan:  1. DM (diabetes mellitus) type II controlled peripheral vascular disorder Check labs , cont meds - Hemoglobin A1c - POCT urinalysis dipstick  2. HTN (hypertension) stable - POCT urinalysis dipstick  3. Other and unspecified  hyperlipidemia Check labs - Basic metabolic panel - Hepatic function panel - Lipid panel

## 2013-09-30 NOTE — Assessment & Plan Note (Signed)
Use debrox and rto next week for irrigation

## 2013-09-30 NOTE — Patient Instructions (Signed)

## 2013-10-01 ENCOUNTER — Telehealth: Payer: Self-pay | Admitting: Family Medicine

## 2013-10-01 NOTE — Telephone Encounter (Signed)
Relevant patient education assigned to patient using Emmi. ° °

## 2013-10-05 ENCOUNTER — Ambulatory Visit (INDEPENDENT_AMBULATORY_CARE_PROVIDER_SITE_OTHER): Payer: Medicare Other | Admitting: Family Medicine

## 2013-10-05 ENCOUNTER — Encounter: Payer: Self-pay | Admitting: Family Medicine

## 2013-10-05 VITALS — BP 132/72 | HR 98 | Temp 99.1°F | Wt 132.6 lb

## 2013-10-05 DIAGNOSIS — H6120 Impacted cerumen, unspecified ear: Secondary | ICD-10-CM

## 2013-10-05 NOTE — Progress Notes (Signed)
   Subjective:    Patient ID: Natale MilchMary R Ogden, female    DOB: Jun 06, 1929, 80 y.o.   MRN: [id number]  HPI Pt here c/o ears feeling clogged b/l. No other symptoms   Review of Systems As above    Objective:   Physical Exam  BP 132/72  Pulse 98  Temp(Src) 99.1 F (37.3 C) (Oral)  Wt 132 lb 9.6 oz (60.147 kg)  SpO2 97% General appearance: alert, cooperative, appears stated age and no distress Ears: + cerumen b/l --- unable to remove with hoop--- irrigated with no complications.       Assessment & Plan:  Cerumen impaction--- b/l            rto prn

## 2013-10-14 ENCOUNTER — Telehealth: Payer: Self-pay | Admitting: Family Medicine

## 2013-10-14 MED ORDER — GLUCOSE BLOOD VI STRP: ORAL_STRIP | Status: DC

## 2013-10-14 NOTE — Telephone Encounter (Signed)
Rx faxed.    KP 

## 2013-10-14 NOTE — Telephone Encounter (Signed)
Caller name: Jaedin  Relation to pt: Call back number:[phone] Pharmacy:[facility] - , [place] - [address]   Reason for call:   Pt is needing refill on glucose blood (ONE TOUCH ULTRA TEST) test strip

## 2013-10-19 ENCOUNTER — Telehealth: Payer: Self-pay

## 2013-10-19 NOTE — Telephone Encounter (Signed)
Relevant patient education mailed to patient.  

## 2013-11-08 ENCOUNTER — Ambulatory Visit: Payer: Self-pay | Admitting: Internal Medicine

## 2013-11-08 ENCOUNTER — Telehealth: Payer: Self-pay | Admitting: Family Medicine

## 2013-11-08 ENCOUNTER — Emergency Department (HOSPITAL_COMMUNITY): Payer: Medicare Other

## 2013-11-08 ENCOUNTER — Encounter (HOSPITAL_COMMUNITY): Payer: Self-pay | Admitting: Emergency Medicine

## 2013-11-08 ENCOUNTER — Inpatient Hospital Stay (HOSPITAL_COMMUNITY)
Admission: EM | Admit: 2013-11-08 | Discharge: 2013-11-21 | DRG: 216 | Disposition: A | Discharge status: Home Health | Payer: Medicare Other | Attending: Thoracic Surgery (Cardiothoracic Vascular Surgery) | Admitting: Thoracic Surgery (Cardiothoracic Vascular Surgery)

## 2013-11-08 DIAGNOSIS — Z951 Presence of aortocoronary bypass graft: Secondary | ICD-10-CM

## 2013-11-08 DIAGNOSIS — M810 Age-related osteoporosis without current pathological fracture: Secondary | ICD-10-CM | POA: Diagnosis present

## 2013-11-08 DIAGNOSIS — I701 Atherosclerosis of renal artery: Secondary | ICD-10-CM | POA: Diagnosis present

## 2013-11-08 DIAGNOSIS — I2589 Other forms of chronic ischemic heart disease: Secondary | ICD-10-CM | POA: Diagnosis present

## 2013-11-08 DIAGNOSIS — D696 Thrombocytopenia, unspecified: Secondary | ICD-10-CM | POA: Diagnosis not present

## 2013-11-08 DIAGNOSIS — I509 Heart failure, unspecified: Secondary | ICD-10-CM | POA: Diagnosis present

## 2013-11-08 DIAGNOSIS — IMO0001 Reserved for inherently not codable concepts without codable children: Secondary | ICD-10-CM | POA: Diagnosis present

## 2013-11-08 DIAGNOSIS — I252 Old myocardial infarction: Secondary | ICD-10-CM

## 2013-11-08 DIAGNOSIS — D649 Anemia, unspecified: Secondary | ICD-10-CM

## 2013-11-08 DIAGNOSIS — N189 Chronic kidney disease, unspecified: Secondary | ICD-10-CM | POA: Diagnosis present

## 2013-11-08 DIAGNOSIS — I059 Rheumatic mitral valve disease, unspecified: Secondary | ICD-10-CM | POA: Diagnosis present

## 2013-11-08 DIAGNOSIS — Z888 Allergy status to other drugs, medicaments and biological substances status: Secondary | ICD-10-CM

## 2013-11-08 DIAGNOSIS — E1151 Type 2 diabetes mellitus with diabetic peripheral angiopathy without gangrene: Secondary | ICD-10-CM | POA: Diagnosis present

## 2013-11-08 DIAGNOSIS — Q2111 Secundum atrial septal defect: Secondary | ICD-10-CM

## 2013-11-08 DIAGNOSIS — D62 Acute posthemorrhagic anemia: Secondary | ICD-10-CM | POA: Diagnosis not present

## 2013-11-08 DIAGNOSIS — IMO0002 Reserved for concepts with insufficient information to code with codable children: Secondary | ICD-10-CM | POA: Diagnosis present

## 2013-11-08 DIAGNOSIS — H269 Unspecified cataract: Secondary | ICD-10-CM | POA: Diagnosis present

## 2013-11-08 DIAGNOSIS — R5381 Other malaise: Secondary | ICD-10-CM | POA: Diagnosis present

## 2013-11-08 DIAGNOSIS — Z7982 Long term (current) use of aspirin: Secondary | ICD-10-CM

## 2013-11-08 DIAGNOSIS — Z882 Allergy status to sulfonamides status: Secondary | ICD-10-CM

## 2013-11-08 DIAGNOSIS — I745 Embolism and thrombosis of iliac artery: Secondary | ICD-10-CM | POA: Diagnosis not present

## 2013-11-08 DIAGNOSIS — E1165 Type 2 diabetes mellitus with hyperglycemia: Secondary | ICD-10-CM

## 2013-11-08 DIAGNOSIS — I10 Essential (primary) hypertension: Secondary | ICD-10-CM

## 2013-11-08 DIAGNOSIS — I129 Hypertensive chronic kidney disease with stage 1 through stage 4 chronic kidney disease, or unspecified chronic kidney disease: Secondary | ICD-10-CM | POA: Diagnosis present

## 2013-11-08 DIAGNOSIS — D509 Iron deficiency anemia, unspecified: Secondary | ICD-10-CM | POA: Diagnosis present

## 2013-11-08 DIAGNOSIS — I4891 Unspecified atrial fibrillation: Secondary | ICD-10-CM | POA: Diagnosis not present

## 2013-11-08 DIAGNOSIS — I5021 Acute systolic (congestive) heart failure: Secondary | ICD-10-CM | POA: Diagnosis present

## 2013-11-08 DIAGNOSIS — E876 Hypokalemia: Secondary | ICD-10-CM | POA: Diagnosis not present

## 2013-11-08 DIAGNOSIS — I2510 Atherosclerotic heart disease of native coronary artery without angina pectoris: Secondary | ICD-10-CM | POA: Diagnosis present

## 2013-11-08 DIAGNOSIS — E538 Deficiency of other specified B group vitamins: Secondary | ICD-10-CM | POA: Diagnosis present

## 2013-11-08 DIAGNOSIS — Q211 Atrial septal defect: Secondary | ICD-10-CM

## 2013-11-08 DIAGNOSIS — Z87891 Personal history of nicotine dependence: Secondary | ICD-10-CM

## 2013-11-08 DIAGNOSIS — I2582 Chronic total occlusion of coronary artery: Secondary | ICD-10-CM | POA: Diagnosis present

## 2013-11-08 DIAGNOSIS — I2789 Other specified pulmonary heart diseases: Secondary | ICD-10-CM | POA: Diagnosis present

## 2013-11-08 DIAGNOSIS — I214 Non-ST elevation (NSTEMI) myocardial infarction: Principal | ICD-10-CM | POA: Diagnosis present

## 2013-11-08 DIAGNOSIS — Z9889 Other specified postprocedural states: Secondary | ICD-10-CM

## 2013-11-08 DIAGNOSIS — E785 Hyperlipidemia, unspecified: Secondary | ICD-10-CM | POA: Diagnosis present

## 2013-11-08 DIAGNOSIS — R079 Chest pain, unspecified: Secondary | ICD-10-CM | POA: Diagnosis present

## 2013-11-08 DIAGNOSIS — I255 Ischemic cardiomyopathy: Secondary | ICD-10-CM

## 2013-11-08 DIAGNOSIS — I739 Peripheral vascular disease, unspecified: Secondary | ICD-10-CM | POA: Diagnosis present

## 2013-11-08 LAB — COMPREHENSIVE METABOLIC PANEL
ALT: 29 U/L (ref 0–35)
AST: 34 U/L (ref 0–37)
Albumin: 3.8 g/dL (ref 3.5–5.2)
Alkaline Phosphatase: 57 U/L (ref 39–117)
BUN: 17 mg/dL (ref 6–23)
CHLORIDE: 108 meq/L (ref 96–112)
CO2: 18 meq/L — AB (ref 19–32)
CREATININE: 0.96 mg/dL (ref 0.50–1.10)
Calcium: 9.5 mg/dL (ref 8.4–10.5)
GFR, EST AFRICAN AMERICAN: 63 mL/min — AB (ref >90)
GFR, EST NON AFRICAN AMERICAN: 54 mL/min — AB (ref >90)
GLUCOSE: 217 mg/dL — AB (ref 70–99)
Potassium: 4.5 mEq/L (ref 3.7–5.3)
Sodium: 145 mEq/L (ref 137–147)
Total Bilirubin: 0.7 mg/dL (ref 0.3–1.2)
Total Protein: 7.1 g/dL (ref 6.0–8.3)

## 2013-11-08 LAB — CBC WITH DIFFERENTIAL/PLATELET
BASOS PCT: 0 % (ref 0–1)
Basophils Absolute: 0 10*3/uL (ref 0.0–0.1)
Eosinophils Absolute: 0 10*3/uL (ref 0.0–0.7)
Eosinophils Relative: 0 % (ref 0–5)
HEMATOCRIT: 28.4 % — AB (ref 36.0–46.0)
HEMOGLOBIN: 8.7 g/dL — AB (ref 12.0–15.0)
LYMPHS ABS: 1.4 10*3/uL (ref 0.7–4.0)
LYMPHS PCT: 15 % (ref 12–46)
MCH: 29.4 pg (ref 26.0–34.0)
MCHC: 30.6 g/dL (ref 30.0–36.0)
MCV: 95.9 fL (ref 78.0–100.0)
MONO ABS: 0.8 10*3/uL (ref 0.1–1.0)
MONOS PCT: 8 % (ref 3–12)
NEUTROS ABS: 7.2 10*3/uL (ref 1.7–7.7)
Neutrophils Relative %: 77 % (ref 43–77)
Platelets: 252 10*3/uL (ref 150–400)
RBC: 2.96 MIL/uL — AB (ref 3.87–5.11)
RDW: 13.3 % (ref 11.5–15.5)
WBC: 9.4 10*3/uL (ref 4.0–10.5)

## 2013-11-08 LAB — IRON AND TIBC
IRON: 23 ug/dL — AB (ref 42–135)
SATURATION RATIOS: 5 % — AB (ref 20–55)
TIBC: 424 ug/dL (ref 250–470)
UIBC: 401 ug/dL — AB (ref 125–400)

## 2013-11-08 LAB — POTASSIUM: Potassium: 3.9 mEq/L (ref 3.7–5.3)

## 2013-11-08 LAB — I-STAT TROPONIN, ED: TROPONIN I, POC: 0.49 ng/mL — AB (ref 0.00–0.08)

## 2013-11-08 LAB — CBG MONITORING, ED: Glucose-Capillary: 161 mg/dL — ABNORMAL HIGH (ref 70–99)

## 2013-11-08 LAB — TROPONIN I
Troponin I: 1.08 ng/mL (ref <0.30)
Troponin I: 3.53 ng/mL (ref <0.30)
Troponin I: 4.86 ng/mL (ref <0.30)

## 2013-11-08 LAB — FERRITIN: Ferritin: 11 ng/mL (ref 10–291)

## 2013-11-08 LAB — VITAMIN B12: Vitamin B-12: 979 pg/mL — ABNORMAL HIGH (ref 211–911)

## 2013-11-08 LAB — MAGNESIUM: MAGNESIUM: 1.9 mg/dL (ref 1.5–2.5)

## 2013-11-08 LAB — GLUCOSE, CAPILLARY: GLUCOSE-CAPILLARY: 136 mg/dL — AB (ref 70–99)

## 2013-11-08 LAB — MRSA PCR SCREENING: MRSA BY PCR: NEGATIVE

## 2013-11-08 LAB — PRO B NATRIURETIC PEPTIDE: PRO B NATRI PEPTIDE: 4174 pg/mL — AB (ref 0–450)

## 2013-11-08 LAB — TSH: TSH: 1.31 u[IU]/mL (ref 0.350–4.500)

## 2013-11-08 MED ORDER — ASPIRIN EC 81 MG PO TBEC
81.0000 mg | DELAYED_RELEASE_TABLET | Freq: Every day | ORAL | Status: DC
  Administered 2013-11-09 – 2013-11-10 (×2): 81 mg via ORAL
  Filled 2013-11-08 (×3): qty 1

## 2013-11-08 MED ORDER — SODIUM CHLORIDE 0.9 % IV SOLN: 250.0000 mL | INTRAVENOUS | Status: DC | PRN

## 2013-11-08 MED ORDER — INSULIN ASPART 100 UNIT/ML ~~LOC~~ SOLN: 0.0000 [IU] | Freq: Every day | SUBCUTANEOUS | Status: DC

## 2013-11-08 MED ORDER — ALPRAZOLAM 0.5 MG PO TABS
0.5000 mg | ORAL_TABLET | Freq: Once | ORAL | Status: AC
  Administered 2013-11-08: 0.5 mg via ORAL
  Filled 2013-11-08: qty 1

## 2013-11-08 MED ORDER — FUROSEMIDE 10 MG/ML IJ SOLN
40.0000 mg | Freq: Two times a day (BID) | INTRAMUSCULAR | Status: DC
  Administered 2013-11-08 – 2013-11-09 (×3): 40 mg via INTRAVENOUS
  Filled 2013-11-08 (×7): qty 4

## 2013-11-08 MED ORDER — IPRATROPIUM BROMIDE 0.02 % IN SOLN
0.5000 mg | Freq: Once | RESPIRATORY_TRACT | Status: AC
  Administered 2013-11-08: 0.5 mg via RESPIRATORY_TRACT
  Filled 2013-11-08: qty 2.5

## 2013-11-08 MED ORDER — ALBUTEROL SULFATE (2.5 MG/3ML) 0.083% IN NEBU
5.0000 mg | INHALATION_SOLUTION | Freq: Once | RESPIRATORY_TRACT | Status: AC
  Administered 2013-11-08: 5 mg via RESPIRATORY_TRACT
  Filled 2013-11-08: qty 6

## 2013-11-08 MED ORDER — CARVEDILOL 3.125 MG PO TABS
3.1250 mg | ORAL_TABLET | Freq: Two times a day (BID) | ORAL | Status: DC
Start: 2013-11-09 — End: 2013-11-11
  Administered 2013-11-09 – 2013-11-10 (×4): 3.125 mg via ORAL
  Filled 2013-11-08 (×7): qty 1

## 2013-11-08 MED ORDER — SODIUM CHLORIDE 0.9 % IJ SOLN: 3.0000 mL | INTRAMUSCULAR | Status: DC | PRN

## 2013-11-08 MED ORDER — POTASSIUM CHLORIDE CRYS ER 20 MEQ PO TBCR
20.0000 meq | EXTENDED_RELEASE_TABLET | Freq: Every day | ORAL | Status: DC
  Administered 2013-11-08: 20 meq via ORAL
  Filled 2013-11-08: qty 1

## 2013-11-08 MED ORDER — INSULIN ASPART 100 UNIT/ML ~~LOC~~ SOLN: 0.0000 [IU] | Freq: Three times a day (TID) | SUBCUTANEOUS | Status: DC

## 2013-11-08 MED ORDER — SIMVASTATIN 40 MG PO TABS
40.0000 mg | ORAL_TABLET | Freq: Every day | ORAL | Status: DC
  Administered 2013-11-08 – 2013-11-16 (×8): 40 mg via ORAL
  Filled 2013-11-08 (×11): qty 1

## 2013-11-08 MED ORDER — FUROSEMIDE 10 MG/ML IJ SOLN
40.0000 mg | INTRAMUSCULAR | Status: AC
  Administered 2013-11-08: 40 mg via INTRAVENOUS
  Filled 2013-11-08: qty 4

## 2013-11-08 MED ORDER — MAGNESIUM OXIDE 400 (241.3 MG) MG PO TABS
800.0000 mg | ORAL_TABLET | Freq: Once | ORAL | Status: AC
  Administered 2013-11-09: 800 mg via ORAL
  Filled 2013-11-08: qty 2

## 2013-11-08 MED ORDER — INSULIN ASPART 100 UNIT/ML ~~LOC~~ SOLN
0.0000 [IU] | Freq: Three times a day (TID) | SUBCUTANEOUS | Status: DC
  Administered 2013-11-09 – 2013-11-10 (×3): 1 [IU] via SUBCUTANEOUS

## 2013-11-08 MED ORDER — LISINOPRIL 5 MG PO TABS
5.0000 mg | ORAL_TABLET | Freq: Every day | ORAL | Status: DC
  Administered 2013-11-08 – 2013-11-10 (×3): 5 mg via ORAL
  Filled 2013-11-08 (×4): qty 1

## 2013-11-08 MED ORDER — POTASSIUM CHLORIDE ER 10 MEQ PO TBCR
40.0000 meq | EXTENDED_RELEASE_TABLET | Freq: Every day | ORAL | Status: DC
  Administered 2013-11-09: 40 meq via ORAL
  Filled 2013-11-08 (×3): qty 4

## 2013-11-08 MED ORDER — SODIUM CHLORIDE 0.9 % IJ SOLN
3.0000 mL | Freq: Two times a day (BID) | INTRAMUSCULAR | Status: DC
  Administered 2013-11-08 – 2013-11-09 (×2): 3 mL via INTRAVENOUS

## 2013-11-08 NOTE — Progress Notes (Signed)
ANTICOAGULATION CONSULT NOTE - Initial Consult  Pharmacy Consult for Heparin Indication: chest pain/ACS  Allergies  Allergen Reactions  . Meperidine Hcl   . Sulfonamide Derivatives     Patient Measurements: Height: 5' (152.4 cm) Weight: 129 lb 4.8 oz (58.65 kg) IBW/kg (Calculated) : 45.5  Vital Signs: Temp: 98.9 F (37.2 C) (06/15 2000) Temp src: Oral (06/15 2300) BP: 135/65 mmHg (06/15 2030) Pulse Rate: 111 (06/15 2115)  Labs:  Recent Labs  11/08/13 1030 11/08/13 1554 11/08/13 2125  HGB 8.7*  --   --   HCT 28.4*  --   --   PLT 252  --   --   CREATININE 0.96  --   --   TROPONINI 1.08* 3.53* 4.86*    Estimated Creatinine Clearance: 36.9 ml/min (by C-G formula based on Cr of 0.96).   Medical History: Past Medical History  Diagnosis Date  . Hyperlipidemia   . Hypertension   . Hemorrhoids   . AVM (arteriovenous malformation)   . Diabetes mellitus, type 2   . Anxiety   . PVD (peripheral vascular disease)   . Osteoporosis   . Vitamin B12 deficiency   . Cataract     Medications:  Prescriptions prior to admission  Medication Sig Dispense Refill  . aspirin 81 MG tablet Take 81 mg by mouth daily.        . Calcium Carbonate-Vitamin D (CALCIUM-VITAMIN D) 600-200 MG-UNIT CAPS Take by mouth 2 (two) times daily.        Emily Daniel. Casanthranol-Docusate Sodium 30-100 MG CAPS Take 1 capsule by mouth daily as needed. As needed for constipation      . ibuprofen (ADVIL,MOTRIN) 400 MG tablet Take 400 mg by mouth every 6 (six) hours as needed for mild pain.       . Lancets MISC by Does not apply route.        [name]. lisinopril (PRINIVIL,ZESTRIL) 20 MG tablet Take 20 mg by mouth daily.      . metFORMIN (GLUCOPHAGE XR) 500 MG 24 hr tablet 1 tablet (500 mg) by mouth every night.  Recheck labs in 3 months (11/2013).  30 tablet  2  . Multiple Vitamin (MULTIVITAMIN) tablet Take 1 tablet by mouth daily.        . simvastatin (ZOCOR) 40 MG tablet Take 40 mg by mouth daily.      . vitamin B-12  (CYANOCOBALAMIN) 1000 MCG tablet Take 1,000 mcg by mouth daily.        [name]. glucose blood (ONE TOUCH ULTRA TEST) test strip TEST TWICE DAILY Dx 250.02  100 each  9    Assessment: 80 y.o. female with chest pain, elevated cardiac markers,  for heparin  Goal of Therapy:  Heparin level 0.3-0.7 units/ml Monitor platelets by anticoagulation protocol: Yes   Plan:  Heparin 3000 units IV bolus, then start heparin 700 units/hr Follow-up am labs.  Emily Daniel, Emily Daniel 11/08/2013,11:58 PM

## 2013-11-08 NOTE — ED Notes (Signed)
CBG 161 

## 2013-11-08 NOTE — Significant Event (Signed)
Pt with rising troponin up to 4.86 ng/dl. CP free. Will start heparin gtt. Apparently the nurse was waiting on the results of occult blood testing in the stool. Pt has not had a BM yet.  Patient needs to go to the cath lab. Will start heparin and see if she can tolerate anticoagulation. She was made NPO

## 2013-11-08 NOTE — Telephone Encounter (Signed)
Patient Information:  Caller Name: Corrie DandyMary  Phone: [phone]  Patient: Emily Daniel, Emily Daniel  Gender: Female  DOB: [birth year]  Age: 80 Years  PCP: Lelon PerlaLowne, Yvonne R.  Office Follow Up:  Does the office need to follow up with this patient?: No  Instructions For The Office: N/A   Symptoms  Reason For Call & Symptoms: Pt states having problems with getting a deep breathe.  Pt reports she has inhaled Seven dust mixed with water.  Pt reports she spoke with Poison Control and advised to call office for appt.  Reviewed Health History In EMR: Yes  Reviewed Medications In EMR: Yes  Reviewed Allergies In EMR: Yes  Reviewed Surgeries / Procedures: Yes  Date of Onset of Symptoms: 11/05/2013  Guideline(s) Used:  Breathing Difficulty  Poisoning - Ingestion  Disposition Per Guideline:   Go to Office Now  Reason For Disposition Reached:   Caller wants child seen  Advice Given:  N/A  Patient Will Follow Care Advice:  YES  Appointment Scheduled:  11/08/2013 11:00:00 Appointment Scheduled Provider:  Willow OraPaz, Jose

## 2013-11-08 NOTE — Progress Notes (Signed)
Echocardiogram 2D Echocardiogram has been performed.  Emily Daniel 11/08/2013, 4:03 PM

## 2013-11-08 NOTE — ED Notes (Signed)
Pt taken off BIPAP placed on 2L [place] to observe. Offered pt crackers and drink.per PA

## 2013-11-08 NOTE — ED Provider Notes (Signed)
Please see my other note for this patient.  Gerhard Munchobert Charvis Lightner, MD 11/08/13 [id number]

## 2013-11-08 NOTE — ED Notes (Signed)
Communication with Resp. Currently in Trauma will bring BIPAP

## 2013-11-08 NOTE — ED Notes (Signed)
PA at bedside. Cards

## 2013-11-08 NOTE — ED Provider Notes (Signed)
  This was a shared visit with a mid-level provided (NP or PA).  Throughout the patient's course I was available for consultation/collaboration.  I saw the ECG (if appropriate), relevant labs and studies - I agree with the interpretation.  On my exam the patient was in respiratory distress. Patient has no prior history of CHF, nor COPD.  She does have a history of MI greater than 50 years ago. With notable tachycardia, accessory muscle use, respiratory distress, and clinically fluid overloaded state, patient had empiric noninvasive ventilatory support with BiPAP.  Subsequently the patient had substantially reduced work of breathing, and her tachycardia resolved. The patient's labs demonstrate BNP elevation as well as elevated troponin. Patient had no chest pain initially, and none after one hour of BiPAP.  Given concern for new HF with elevated troponin, the patient required admission for further E/M.  CRITICAL CARE Performed by: Gerhard MunchLOCKWOOD, Chaniyah Jahr Total critical care time: 35 Critical care time was exclusive of separately billable procedures and treating other patients. Critical care was necessary to treat or prevent imminent or life-threatening deterioration. Critical care was time spent personally by me on the following activities: development of treatment plan with patient and/or surrogate as well as nursing, discussions with consultants, evaluation of patient's response to treatment, examination of patient, obtaining history from patient or surrogate, ordering and performing treatments and interventions, ordering and review of laboratory studies, ordering and review of radiographic studies, pulse oximetry and re-evaluation of patient's condition.         Gerhard Munchobert Rise Traeger, MD 11/08/13 1231

## 2013-11-08 NOTE — Consult Note (Signed)
[facility] Medical Consultation  Natale MilchMary R Daniel WGN:[id number] DOB: 14-Feb-1930 DOA: 11/08/2013 PCP: Loreen FreudYvonne Lowne, DO   Requesting physician: er Date of consultation: 11/08/13 Reason for consultation: anemia, DM  Impression/Recommendations Principal Problem:   Congestive heart failure Active Problems:   DIABETES MELLITUS, TYPE II, UNCONTROLLED   HYPERLIPIDEMIA   HYPERTENSION   Chest pain   NSTEMI (non-ST elevated myocardial infarction)   Anemia   Acute heart failure    1. Anemia- currently 8.; baseline is 11.5- patient is volume overloaded, so suspect Hgb will increase with diuresis,  Patient had colonoscopy in 2014 by Russella DarStark showing AVMs and internal hemorrhoids, MCV 95- check B12 and Fe studies 2. DM- SSI, hold metformin 3. Acute CHF- per primary 4. HTN- per primary 5. HLD- per primary 6. NSTEMI- per primary  I will followup again tomorrow. Please contact me if I can be of assistance in the meanwhile. Thank you for this consultation.  Chief Complaint: SOB  HPI:  80 yo female with 2 days of SOB and chest pain.  Patient endorses 1-2 weeks of LE swelling.   In the ER, patient was found to be in respiratory distress and was placed on Bipap.  She is being admitted by cardiology for NSTEMI and CHF (no history).  She was given lasix with improvement in the ER and [facility]  Hospitalist were consulted for DM and anemia-  Patient had colonoscopy in 06/2012 with AVMs and hemorrhoids found.  No further work up or bleeding.   No fevers, no chills. Blood sugar are controlled at home  Review of Systems:  All systems reviewed, negative unless stated above   Past Medical History  Diagnosis Date  . Hyperlipidemia   . Hypertension   . Hemorrhoids   . AVM (arteriovenous malformation)   . Diabetes mellitus, type 2   . Anxiety   . PVD (peripheral vascular disease)   . Osteoporosis   . Vitamin B12 deficiency   . Cataract    Past Surgical History  Procedure  Laterality Date  . Appendectomy      1945  . Abdominal hysterectomy      1969  . Hemorrhoid surgery      1959   Social History:  reports that she quit smoking about 29 years ago. Her smoking use included Cigarettes. She has a 36 pack-year smoking history. She has never used smokeless tobacco. She reports that she does not drink alcohol or use illicit drugs.  Allergies  Allergen Reactions  . Meperidine Hcl   . Sulfonamide Derivatives    Family History  Problem Relation Age of Onset  . Cancer Mother     cervical   . Cancer Brother     renal ...x3??  [name]. Hypertension Other   . Stroke Other     Prior to Admission medications   Medication Sig Start Date End Date Taking? Authorizing Provider  aspirin 81 MG tablet Take 81 mg by mouth daily.     Yes Historical Provider, MD  Calcium Carbonate-Vitamin D (CALCIUM-VITAMIN D) 600-200 MG-UNIT CAPS Take by mouth 2 (two) times daily.     Yes Historical Provider, MD  Casanthranol-Docusate Sodium 30-100 MG CAPS Take 1 capsule by mouth daily as needed. As needed for constipation   Yes Historical Provider, MD  ibuprofen (ADVIL,MOTRIN) 400 MG tablet Take 400 mg by mouth every 6 (six) hours as needed for mild pain.    Yes Historical Provider, MD  Lancets MISC by Does not apply  route.     Yes Historical Provider, MD  lisinopril (PRINIVIL,ZESTRIL) 20 MG tablet Take 20 mg by mouth daily.   Yes Historical Provider, MD  metFORMIN (GLUCOPHAGE XR) 500 MG 24 hr tablet 1 tablet (500 mg) by mouth every night.  Recheck labs in 3 months (11/2013). 09/07/13  Yes Lelon PerlaYvonne R Lowne, DO  Multiple Vitamin (MULTIVITAMIN) tablet Take 1 tablet by mouth daily.     Yes Historical Provider, MD  simvastatin (ZOCOR) 40 MG tablet Take 40 mg by mouth daily.   Yes Historical Provider, MD  vitamin B-12 (CYANOCOBALAMIN) 1000 MCG tablet Take 1,000 mcg by mouth daily.     Yes Historical Provider, MD  glucose blood (ONE TOUCH ULTRA TEST) test strip TEST TWICE DAILY Dx 250.02 10/14/13    Lelon PerlaYvonne R Lowne, DO   Physical Exam: Blood pressure 124/106, pulse 111, temperature 98.3 F (36.8 C), temperature source Oral, resp. rate 24, SpO2 97.00%. Filed Vitals:   11/08/13 1520  BP: 124/106  Pulse: 111  Temp:   Resp: 24     General:  Pleasant./cooperative, good spirits  Eyes: wnl  ENT: wnl  Neck: JVD  Cardiovascular: tachy  Respiratory: crackles throughout, no wheezing- mild increase in work of breathing  Abdomen: +BS, soft  Skin: no rashes or lesions  Musculoskeletal: moves all 4 ext, + edema  Psychiatric: normal mood/affect  Neurologic: CN 2-12 grossly intact  Labs on Admission:  Basic Metabolic Panel:  Recent Labs Lab 11/08/13 1030  NA 145  K 4.5  CL 108  CO2 18*  GLUCOSE 217*  BUN 17  CREATININE 0.96  CALCIUM 9.5   Liver Function Tests:  Recent Labs Lab 11/08/13 1030  AST 34  ALT 29  ALKPHOS 57  BILITOT 0.7  PROT 7.1  ALBUMIN 3.8   No results found for this basename: LIPASE, AMYLASE,  in the last 168 hours No results found for this basename: AMMONIA,  in the last 168 hours CBC:  Recent Labs Lab 11/08/13 1030  WBC 9.4  NEUTROABS 7.2  HGB 8.7*  HCT 28.4*  MCV 95.9  PLT 252   Cardiac Enzymes:  Recent Labs Lab 11/08/13 1030  TROPONINI 1.08*   BNP: No components found with this basename: POCBNP,  CBG: No results found for this basename: GLUCAP,  in the last 168 hours  Radiological Exams on Admission: Dg Chest Portable 1 View  11/08/2013   CLINICAL DATA:  Shortness of breath, respiratory distress, hypertension, diabetes  EXAM: PORTABLE CHEST - 1 VIEW  COMPARISON:  Portable exam 1007 hr without priors for comparison.  FINDINGS: Enlargement of cardiac silhouette with pulmonary vascular congestion.  Atherosclerotic calcification of a mildly tortuous thoracic aorta.  Interstitial infiltrates in perihilar and basilar regions likely representing pulmonary edema and CHF.  Suspect small bibasilar effusions and minimal  atelectasis.  No pneumothorax.  Bones demineralized.  IMPRESSION: CHF.   Electronically Signed   By: Ulyses SouthwardMark  Boles M.D.   On: 11/08/2013 10:36      Time spent: 35 min  Marlin CanaryVANN, Kalli Greenfield [facility] Pager [phone]  If 7PM-7AM, please contact night-coverage [email or web address] Password [email or web address] 11/08/2013, 3:35 PM

## 2013-11-08 NOTE — ED Provider Notes (Addendum)
CSN: [id number]     Arrival date & time 11/08/13  4098 History   First MD Initiated Contact with Patient 11/08/13 [phone]     Chief Complaint  Patient presents with  . Shortness of Breath     (Consider location/radiation/quality/duration/timing/severity/associated sxs/prior Treatment) HPI Comments: Patient presents to the emergency department with chief complaint of shortness of breath x2 days. She states the symptoms been progressively worsening. She also complains of burning sensation under her arms and across her chest. She states that she noticed some lower extremity swelling in the right leg about 2 weeks ago, and left leg about one week ago. She has no known cardiac disease. She states that her chest and abdomen felt tight. She has not tried taking anything to alleviate her symptoms. There are no aggravating factors.  The history is provided by the patient. No language interpreter was used.    Past Medical History  Diagnosis Date  . Hyperlipidemia   . Hypertension   . Hemorrhoids   . AVM (arteriovenous malformation)   . Diabetes mellitus, type 2   . Anxiety   . PVD (peripheral vascular disease)   . Osteoporosis   . Vitamin B12 deficiency   . Cataract    Past Surgical History  Procedure Laterality Date  . Appendectomy      1945  . Abdominal hysterectomy      1969  . Hemorrhoid surgery      1959   Family History  Problem Relation Age of Onset  . Cancer Mother     cervical   . Cancer Brother     renal ...x3??  [name] Hypertension Other   . Stroke Other    History  Substance Use Topics  . Smoking status: Former Smoker -- 1.00 packs/day for 36 years    Types: Cigarettes    Quit date: 02/18/1984  . Smokeless tobacco: Never Used  . Alcohol Use: No   OB History   Grav Para Term Preterm Abortions TAB SAB Ect Mult Living                 Review of Systems  All other systems reviewed and are negative.     Allergies  Meperidine hcl and Sulfonamide  derivatives  Home Medications   Prior to Admission medications   Medication Sig Start Date End Date Taking? Authorizing Provider  aspirin 81 MG tablet Take 81 mg by mouth daily.      Historical Provider, MD  Calcium Carbonate-Vitamin D (CALCIUM-VITAMIN D) 600-200 MG-UNIT CAPS Take by mouth 2 (two) times daily.      Historical Provider, MD  Casanthranol-Docusate Sodium 30-100 MG CAPS Take by mouth. As needed for constipation    Historical Provider, MD  glucose blood (ONE TOUCH ULTRA TEST) test strip TEST TWICE DAILY Dx 250.02 10/14/13   Lelon Perla, DO  ibuprofen (ADVIL,MOTRIN) 400 MG tablet Take 400 mg by mouth every 6 (six) hours as needed.      Historical Provider, MD  Lancets MISC by Does not apply route.      Historical Provider, MD  lisinopril (PRINIVIL,ZESTRIL) 20 MG tablet TAKE ONE TABLET BY MOUTH ONCE DAILY 05/17/13   Lelon Perla, DO  metFORMIN (GLUCOPHAGE XR) 500 MG 24 hr tablet 1 tablet (500 mg) by mouth every night.  Recheck labs in 3 months (11/2013). 09/07/13   Lelon Perla, DO  Multiple Vitamin (MULTIVITAMIN) tablet Take 1 tablet by mouth daily.      Historical Provider, MD  simvastatin (  ZOCOR) 40 MG tablet TAKE ONE TABLET BY MOUTH ONCE DAILY 05/17/13   Lelon PerlaYvonne R Lowne, DO  vitamin B-12 (CYANOCOBALAMIN) 1000 MCG tablet Take 1,000 mcg by mouth daily.      Historical Provider, MD   BP 118/68  Pulse 127  Temp(Src) 98.3 F (36.8 C) (Oral)  Resp 18  SpO2 96% Physical Exam  Nursing note and vitals reviewed. Constitutional: She is oriented to person, place, and time. She appears well-developed and well-nourished.  Moderate respiratory distress  HENT:  Head: Normocephalic and atraumatic.  Eyes: Conjunctivae and EOM are normal. Pupils are equal, round, and reactive to light.  Neck: Normal range of motion. Neck supple.  Cardiovascular: Regular rhythm.  Exam reveals no gallop and no friction rub.   No murmur heard. Tachycardic  Pulmonary/Chest: Effort normal. No respiratory  distress. She has wheezes. She has no rales. She exhibits no tenderness.  Mild scattered wheezes, increased work of breathing, speaks 2-3 words at a time  Abdominal: Soft. Bowel sounds are normal. She exhibits no distension and no mass. There is no tenderness. There is no rebound and no guarding.  No focal abdominal tenderness, no RLQ tenderness or pain at McBurney's point, no RUQ tenderness or Murphy's sign, no left-sided abdominal tenderness, no fluid wave, or signs of peritonitis   Musculoskeletal: Normal range of motion. She exhibits edema. She exhibits no tenderness.  1+ pitting edema in right lower extremity  Neurological: She is alert and oriented to person, place, and time.  Skin: Skin is warm and dry.  Psychiatric: She has a normal mood and affect. Her behavior is normal. Judgment and thought content normal.    ED Course  Procedures (including critical care time) Results for orders placed during the hospital encounter of 11/08/13  CBC WITH DIFFERENTIAL      Result Value Ref Range   WBC 9.4  4.0 - 10.5 K/uL   RBC 2.96 (*) 3.87 - 5.11 MIL/uL   Hemoglobin 8.7 (*) 12.0 - 15.0 g/dL   HCT 16.128.4 (*) 09.636.0 - 04.546.0 %   MCV 95.9  78.0 - 100.0 fL   MCH 29.4  26.0 - 34.0 pg   MCHC 30.6  30.0 - 36.0 g/dL   RDW 40.913.3  81.111.5 - 91.415.5 %   Platelets 252  150 - 400 K/uL   Neutrophils Relative % 77  43 - 77 %   Neutro Abs 7.2  1.7 - 7.7 K/uL   Lymphocytes Relative 15  12 - 46 %   Lymphs Abs 1.4  0.7 - 4.0 K/uL   Monocytes Relative 8  3 - 12 %   Monocytes Absolute 0.8  0.1 - 1.0 K/uL   Eosinophils Relative 0  0 - 5 %   Eosinophils Absolute 0.0  0.0 - 0.7 K/uL   Basophils Relative 0  0 - 1 %   Basophils Absolute 0.0  0.0 - 0.1 K/uL  COMPREHENSIVE METABOLIC PANEL      Result Value Ref Range   Sodium 145  137 - 147 mEq/L   Potassium 4.5  3.7 - 5.3 mEq/L   Chloride 108  96 - 112 mEq/L   CO2 18 (*) 19 - 32 mEq/L   Glucose, Bld 217 (*) 70 - 99 mg/dL   BUN 17  6 - 23 mg/dL   Creatinine, Ser 7.820.96   0.50 - 1.10 mg/dL   Calcium 9.5  8.4 - 95.610.5 mg/dL   Total Protein 7.1  6.0 - 8.3 g/dL   Albumin 3.8  3.5 - 5.2 g/dL   AST 34  0 - 37 U/L   ALT 29  0 - 35 U/L   Alkaline Phosphatase 57  39 - 117 U/L   Total Bilirubin 0.7  0.3 - 1.2 mg/dL   GFR calc non Af Amer 54 (*) >90 mL/min   GFR calc Af Amer 63 (*) >90 mL/min  PRO B NATRIURETIC PEPTIDE      Result Value Ref Range   Pro B Natriuretic peptide (BNP) 4174.0 (*) 0 - 450 pg/mL  TROPONIN I      Result Value Ref Range   Troponin I 1.08 (*) <0.30 ng/mL  I-STAT TROPOININ, ED      Result Value Ref Range   Troponin i, poc 0.49 (*) 0.00 - 0.08 ng/mL   Comment NOTIFIED PHYSICIAN     Comment 3            Dg Chest Portable 1 View  11/08/2013   CLINICAL DATA:  Shortness of breath, respiratory distress, hypertension, diabetes  EXAM: PORTABLE CHEST - 1 VIEW  COMPARISON:  Portable exam 1007 hr without priors for comparison.  FINDINGS: Enlargement of cardiac silhouette with pulmonary vascular congestion.  Atherosclerotic calcification of a mildly tortuous thoracic aorta.  Interstitial infiltrates in perihilar and basilar regions likely representing pulmonary edema and CHF.  Suspect small bibasilar effusions and minimal atelectasis.  No pneumothorax.  Bones demineralized.  IMPRESSION: CHF.   Electronically Signed   By: Ulyses SouthwardMark  Boles M.D.   On: 11/08/2013 10:36     Imaging Review Dg Chest Portable 1 View  11/08/2013   CLINICAL DATA:  Shortness of breath, respiratory distress, hypertension, diabetes  EXAM: PORTABLE CHEST - 1 VIEW  COMPARISON:  Portable exam 1007 hr without priors for comparison.  FINDINGS: Enlargement of cardiac silhouette with pulmonary vascular congestion.  Atherosclerotic calcification of a mildly tortuous thoracic aorta.  Interstitial infiltrates in perihilar and basilar regions likely representing pulmonary edema and CHF.  Suspect small bibasilar effusions and minimal atelectasis.  No pneumothorax.  Bones demineralized.  IMPRESSION: CHF.    Electronically Signed   By: Ulyses SouthwardMark  Boles M.D.   On: 11/08/2013 10:36     EKG Interpretation   Date/Time:  Monday November 08 2013 10:00:31 EDT Ventricular Rate:  128 PR Interval:  183 QRS Duration: 121 QT Interval:  357 QTC Calculation: 521 R Axis:   82 Text Interpretation:  Sinus tachycardia Nonspecific intraventricular  conduction delay Repol abnrm suggests ischemia, diffuse leads Baseline  wander in lead(s) II III aVF Sinus tachycardia Non-specific  intra-ventricular conduction delay Artifact Abnormal ekg Confirmed by  Gerhard MunchLOCKWOOD, Natasa Stigall  MD ([id number]) on 11/08/2013 10:18:41 AM      MDM   Final diagnoses:  CHF (congestive heart failure)    Patient seen by and discussed with Dr. Jeraldine LootsLockwood, agrees that this is probably new CHF exacerbation. Will check labs, place patient on BiPAP, and will reevaluate. Patient given 40 of Lasix.  Medications  ipratropium (ATROVENT) nebulizer solution 0.5 mg (0.5 mg Nebulization Given 11/08/13 1012)  albuterol (PROVENTIL) (2.5 MG/3ML) 0.083% nebulizer solution 5 mg (5 mg Nebulization Given 11/08/13 1012)  furosemide (LASIX) injection 40 mg (40 mg Intravenous Given 11/08/13 1039)     12:18 PM Filed Vitals:   11/08/13 1215  BP: 122/96  Pulse: 104  Temp:   Resp: 23   Patient with suspected new onset CHF. Treated with IV Lasix, and placed on BiPAP. Seen by and discussed with Dr. Jeraldine LootsLockwood. Patient is feeling much better. She does  have a bump in her troponin. Will consult cardiology for admission.  CRITICAL CARE Performed by: Roxy Horseman   Total critical care time: 35  Critical care time was exclusive of separately billable procedures and treating other patients.  Critical care was necessary to treat or prevent imminent or life-threatening deterioration.  Critical care was time spent personally by me on the following activities: development of treatment plan with patient and/or surrogate as well as nursing, discussions with consultants,  evaluation of patient's response to treatment, examination of patient, obtaining history from patient or surrogate, ordering and performing treatments and interventions, ordering and review of laboratory studies, ordering and review of radiographic studies, pulse oximetry and re-evaluation of patient's condition.   Roxy Horseman, PA-C 11/08/13 1219  Roxy Horseman, PA-C 11/08/13 1259

## 2013-11-08 NOTE — Telephone Encounter (Signed)
Pt. sprayed plants with [place] insecticide on Thursday afternoon to rid yard of ticks and accidentally inhaled fumes.   First time using the product.  She did not wear a mask.  States she felt fine afterward.    Became SOB on Friday.  Was short of breath all weekend.  She called [place] poison control center and they advised that she see her doctor.  States her shortness of breath is not worse, but about the same as it has been all weekend.  She sound in distress over the phone.  She had to stop periodically during conversation to caught her breath.  States she can take short shallow breaths, but could not inhale deeply.  States she coughing up clear phlegm.  No blood noted.  She was highly encouraged to go to the emergency room instead of coming in at 11:00 am to see Dr. Drue NovelPaz.  Pt stated that she would call her daughter to see what she thought.  We hung up so that patient could call her daughter.  Called patient back within 3 minutes.  Pt stated that her daughter was on her way, but pt asked that I call EMS.  She was in obvious distress at this point.  States shortness of breath worsen whenever she lays flat.  Pt was instructed to sit up right and to try to slow down her breathing. Called EMS and made them aware of what was going on.  They asked that I advise patient to unlock door, not to eat or drink anything, to put away pets, and to gather all of her medications.  Immediately called patient back after hanging up with them and relayed this information to her.  While waiting, pt was advised to take deep breaths through her nose and out through her mouth. Stayed on the phone with patient until EMS got there.

## 2013-11-08 NOTE — ED Notes (Signed)
2D Echocardiogram being performed at the bedside

## 2013-11-08 NOTE — ED Notes (Signed)
80 yo with SOB X 2 days burning under arms and across chest. Lower ext swelling started about 1-2 weeks ago no hx of CHF. Family used a pesticide the other day pt thinks it may be from that. EKG SR. Per EMS Pt sounds wet with slight wheezes. A/O and anxious at times.

## 2013-11-08 NOTE — H&P (Signed)
Cardiologist:  Blanca Friend is an 80 y.o. female.   Chief Complaint:  SOB HPI:   The patient is a very active 80 yo female with a history of CAD(apparently had an MI in 68) HTN, HLD, AVMs, DM II, PVD, remote tobacco use(quit 1985).  She has no prior cardiac history.  She presents with SOB, orthopnea, PND, LEE for the last four days.  She been coughing up clear, white "stuff".  She also has had an intermittent chest "burning" feeling which has been severe, 9/10, and radiating across her chest and into her axillae and arms.  Yesterday it lasted about three hours.  She is currently pain free.  The patient currently denies nausea, vomiting, fever,  dizziness, PND, abdominal pain, hematochezia, melena.    Medications:  Prior to Admission medications   Medication Sig Start Date End Date Taking? Authorizing Provider  aspirin 81 MG tablet Take 81 mg by mouth daily.     Yes Historical Provider, MD  Calcium Carbonate-Vitamin D (CALCIUM-VITAMIN D) 600-200 MG-UNIT CAPS Take by mouth 2 (two) times daily.     Yes Historical Provider, MD  Casanthranol-Docusate Sodium 30-100 MG CAPS Take 1 capsule by mouth daily as needed. As needed for constipation   Yes Historical Provider, MD  ibuprofen (ADVIL,MOTRIN) 400 MG tablet Take 400 mg by mouth every 6 (six) hours as needed for mild pain.    Yes Historical Provider, MD  Lancets MISC by Does not apply route.     Yes Historical Provider, MD  lisinopril (PRINIVIL,ZESTRIL) 20 MG tablet Take 20 mg by mouth daily.   Yes Historical Provider, MD  metFORMIN (GLUCOPHAGE XR) 500 MG 24 hr tablet 1 tablet (500 mg) by mouth every night.  Recheck labs in 3 months (11/2013). 09/07/13  Yes Rosalita Chessman, DO  Multiple Vitamin (MULTIVITAMIN) tablet Take 1 tablet by mouth daily.     Yes Historical Provider, MD  simvastatin (ZOCOR) 40 MG tablet Take 40 mg by mouth daily.   Yes Historical Provider, MD  vitamin B-12 (CYANOCOBALAMIN) 1000 MCG tablet Take 1,000 mcg by mouth  daily.     Yes Historical Provider, MD  glucose blood (ONE TOUCH ULTRA TEST) test strip TEST TWICE DAILY Dx 250.02 10/14/13   Rosalita Chessman, DO     Past Medical History  Diagnosis Date  . Hyperlipidemia   . Hypertension   . Hemorrhoids   . AVM (arteriovenous malformation)   . Diabetes mellitus, type 2   . Anxiety   . PVD (peripheral vascular disease)   . Osteoporosis   . Vitamin B12 deficiency   . Cataract     Past Surgical History  Procedure Laterality Date  . Appendectomy      1945  . Abdominal hysterectomy      1969  . Hemorrhoid surgery      1959    Family History  Problem Relation Age of Onset  . Cancer Mother     cervical   . Cancer Brother     renal ...x3??  [name] Hypertension Other   . Stroke Other    Social History:  reports that she quit smoking about 29 years ago. Her smoking use included Cigarettes. She has a 36 pack-year smoking history. She has never used smokeless tobacco. She reports that she does not drink alcohol or use illicit drugs.  Allergies:  Allergies  Allergen Reactions  . Meperidine Hcl   . Sulfonamide Derivatives      (Not in a  hospital admission)  Results for orders placed during the hospital encounter of 11/08/13 (from the past 48 hour(s))  CBC WITH DIFFERENTIAL     Status: Abnormal   Collection Time    11/08/13 10:30 AM      Result Value Ref Range   WBC 9.4  4.0 - 10.5 K/uL   RBC 2.96 (*) 3.87 - 5.11 MIL/uL   Hemoglobin 8.7 (*) 12.0 - 15.0 g/dL   HCT 28.4 (*) 36.0 - 46.0 %   MCV 95.9  78.0 - 100.0 fL   MCH 29.4  26.0 - 34.0 pg   MCHC 30.6  30.0 - 36.0 g/dL   RDW 13.3  11.5 - 15.5 %   Platelets 252  150 - 400 K/uL   Neutrophils Relative % 77  43 - 77 %   Neutro Abs 7.2  1.7 - 7.7 K/uL   Lymphocytes Relative 15  12 - 46 %   Lymphs Abs 1.4  0.7 - 4.0 K/uL   Monocytes Relative 8  3 - 12 %   Monocytes Absolute 0.8  0.1 - 1.0 K/uL   Eosinophils Relative 0  0 - 5 %   Eosinophils Absolute 0.0  0.0 - 0.7 K/uL   Basophils  Relative 0  0 - 1 %   Basophils Absolute 0.0  0.0 - 0.1 K/uL  COMPREHENSIVE METABOLIC PANEL     Status: Abnormal   Collection Time    11/08/13 10:30 AM      Result Value Ref Range   Sodium 145  137 - 147 mEq/L   Potassium 4.5  3.7 - 5.3 mEq/L   Chloride 108  96 - 112 mEq/L   CO2 18 (*) 19 - 32 mEq/L   Glucose, Bld 217 (*) 70 - 99 mg/dL   BUN 17  6 - 23 mg/dL   Creatinine, Ser 0.96  0.50 - 1.10 mg/dL   Calcium 9.5  8.4 - 10.5 mg/dL   Total Protein 7.1  6.0 - 8.3 g/dL   Albumin 3.8  3.5 - 5.2 g/dL   AST 34  0 - 37 U/L   ALT 29  0 - 35 U/L   Alkaline Phosphatase 57  39 - 117 U/L   Total Bilirubin 0.7  0.3 - 1.2 mg/dL   GFR calc non Af Amer 54 (*) >90 mL/min   GFR calc Af Amer 63 (*) >90 mL/min   Comment: (NOTE)     The eGFR has been calculated using the CKD EPI equation.     This calculation has not been validated in all clinical situations.     eGFR's persistently <90 mL/min signify possible Chronic Kidney     Disease.  PRO B NATRIURETIC PEPTIDE     Status: Abnormal   Collection Time    11/08/13 10:30 AM      Result Value Ref Range   Pro B Natriuretic peptide (BNP) 4174.0 (*) 0 - 450 pg/mL  TROPONIN I     Status: Abnormal   Collection Time    11/08/13 10:30 AM      Result Value Ref Range   Troponin I 1.08 (*) <0.30 ng/mL   Comment:            Due to the release kinetics of cTnI,     a negative result within the first hours     of the onset of symptoms does not rule out     myocardial infarction with certainty.     If myocardial infarction is  still suspected,     repeat the test at appropriate intervals.     CRITICAL RESULT CALLED TO, READ BACK BY AND VERIFIED WITH:     R.BROWNING,PA 11/08/13 1150 BY BSLADE  I-STAT TROPOININ, ED     Status: Abnormal   Collection Time    11/08/13 10:42 AM      Result Value Ref Range   Troponin i, poc 0.49 (*) 0.00 - 0.08 ng/mL   Comment NOTIFIED PHYSICIAN     Comment 3            Comment: Due to the release kinetics of cTnI,     a  negative result within the first hours     of the onset of symptoms does not rule out     myocardial infarction with certainty.     If myocardial infarction is still suspected,     repeat the test at appropriate intervals.   Dg Chest Portable 1 View  11/08/2013   CLINICAL DATA:  Shortness of breath, respiratory distress, hypertension, diabetes  EXAM: PORTABLE CHEST - 1 VIEW  COMPARISON:  Portable exam 1007 hr without priors for comparison.  FINDINGS: Enlargement of cardiac silhouette with pulmonary vascular congestion.  Atherosclerotic calcification of a mildly tortuous thoracic aorta.  Interstitial infiltrates in perihilar and basilar regions likely representing pulmonary edema and CHF.  Suspect small bibasilar effusions and minimal atelectasis.  No pneumothorax.  Bones demineralized.  IMPRESSION: CHF.   Electronically Signed   By: Lavonia Dana M.D.   On: 11/08/2013 10:36    Review of Systems  Constitutional: Negative for fever and diaphoresis.  HENT: Positive for congestion (Clear white "stuff").   Respiratory: Positive for cough and shortness of breath.   Cardiovascular: Positive for chest pain ("burning"), orthopnea, leg swelling and PND.  Gastrointestinal: Negative for nausea, vomiting, abdominal pain, blood in stool and melena.  Genitourinary: Negative for hematuria.  Musculoskeletal: Negative for myalgias.  Neurological: Negative for dizziness and weakness.  All other systems reviewed and are negative.   Blood pressure 120/58, pulse 92, temperature 98.3 F (36.8 C), temperature source Oral, resp. rate 21, SpO2 100.00%. Physical Exam  Nursing note and vitals reviewed. Constitutional: She is oriented to person, place, and time. She appears well-developed and well-nourished. No distress.  HENT:  Head: Normocephalic and atraumatic.  Mouth/Throat: Oropharynx is clear and moist. No oropharyngeal exudate.  Eyes: EOM are normal. Pupils are equal, round, and reactive to light. No scleral  icterus.  Neck: Normal range of motion. Neck supple. JVD present.  Cardiovascular: S1 normal and S2 normal.  An irregular rhythm present. Tachycardia present.   No murmur heard. Pulses:      Radial pulses are 2+ on the right side, and 2+ on the left side.       Dorsalis pedis pulses are 1+ on the right side, and 1+ on the left side.  No Carotid bruits  Respiratory: Effort normal. She has no wheezes. She has rales.  GI: Bowel sounds are normal. She exhibits distension. There is no tenderness.  Musculoskeletal: She exhibits edema.  1+ LEE   Lymphadenopathy:    She has no cervical adenopathy.  Neurological: She is alert and oriented to person, place, and time. She exhibits normal muscle tone.  Skin: Skin is warm and dry.  Psychiatric: She has a normal mood and affect.     Assessment/Plan Active Problems:    Admit to stepdown.    Congestive heart failure The patient currently feels better after being on  BiPAP.  She was given 55m of IV lasix which we will continue BID with another dose today.   BNP is elevated at 4174.0.  We will order a 2D echo.  Monitor potassium.  Continue ACE.     NSTEMI Initial troponin is 1.08.  EKG: Sinus tach with lateral ST changes.  This is in the setting of chest pain, tachycardia, anemia and new acute onset CHF.  We will continue to cycle Q6hr.  She will need a left heart cath once able to lay flat.   High probability of CAD.  History of MI in 1985.  Remote tobacco.  Will add IV heparin if stool guaiac negative.  Check lipids, TSH.  Last A1C was 09/30/13: 5.9.  Add ASA, Consider lopressor 12.524mBID tomorrow as she improves and BP allows.       Chest pain  Currently resolved.  Will add protonix given character of pain.  NTG SL if it returns     DIABETES MELLITUS, TYPE II, UNCONTROLLED  Sliding Scale insulin coverage.  CBG ACHS.       HYPERLIPIDEMIA  Statin   HYPERTENSION   BP stable and controlled.        Anemia  Will check stool guaiac.  Will ask IM  to see.     HATarri FullerPA-C 11/08/2013, 2:06 PM   Patient examined chart reviewed.  Charming [age]o female with distant history of CAD.  Ill since Thursday.  Likely out of hospital SEMI with new onset CHF requiring bipap.  CXR with marked cephalization and kerley B lines.  ECG without acute ST elevation and pressure free now.  Breathing improved with iv lasix and BP a bit soft.  Rales bilaterally no murmur.  Called echo lab to get images done today.  Start heparin if guaic negative.  IM consult for anemia and DM.  Tentatively plan right and left heart cath Wendsday if her CHF clears.  Discussed care with two sons RoHerbie Baltimorend RaKyung RuddFull code and they are in agreement with care plan.  Can add nitrates if BP gets higher  Start beta blocker in am.    PeJenkins Rouge

## 2013-11-09 DIAGNOSIS — I509 Heart failure, unspecified: Secondary | ICD-10-CM

## 2013-11-09 DIAGNOSIS — R079 Chest pain, unspecified: Secondary | ICD-10-CM

## 2013-11-09 LAB — BASIC METABOLIC PANEL
BUN: 20 mg/dL (ref 6–23)
CHLORIDE: 102 meq/L (ref 96–112)
CO2: 23 meq/L (ref 19–32)
Calcium: 9.6 mg/dL (ref 8.4–10.5)
Creatinine, Ser: 1.09 mg/dL (ref 0.50–1.10)
GFR calc Af Amer: 54 mL/min — ABNORMAL LOW (ref >90)
GFR calc non Af Amer: 46 mL/min — ABNORMAL LOW (ref >90)
Glucose, Bld: 162 mg/dL — ABNORMAL HIGH (ref 70–99)
Potassium: 3.9 mEq/L (ref 3.7–5.3)
SODIUM: 143 meq/L (ref 137–147)

## 2013-11-09 LAB — CBC
HCT: 25.4 % — ABNORMAL LOW (ref 36.0–46.0)
HEMATOCRIT: 26.4 % — AB (ref 36.0–46.0)
HEMOGLOBIN: 8.3 g/dL — AB (ref 12.0–15.0)
Hemoglobin: 7.9 g/dL — ABNORMAL LOW (ref 12.0–15.0)
MCH: 29.6 pg (ref 26.0–34.0)
MCH: 29.5 pg (ref 26.0–34.0)
MCHC: 31.4 g/dL (ref 30.0–36.0)
MCHC: 31.1 g/dL (ref 30.0–36.0)
MCV: 94.3 fL (ref 78.0–100.0)
MCV: 94.8 fL (ref 78.0–100.0)
Platelets: 227 10*3/uL (ref 150–400)
Platelets: 215 10*3/uL (ref 150–400)
RBC: 2.8 MIL/uL — AB (ref 3.87–5.11)
RBC: 2.68 MIL/uL — AB (ref 3.87–5.11)
RDW: 13.2 % (ref 11.5–15.5)
RDW: 13.4 % (ref 11.5–15.5)
WBC: 8.5 10*3/uL (ref 4.0–10.5)
WBC: 7.5 10*3/uL (ref 4.0–10.5)

## 2013-11-09 LAB — GLUCOSE, CAPILLARY
Glucose-Capillary: 133 mg/dL — ABNORMAL HIGH (ref 70–99)
Glucose-Capillary: 114 mg/dL — ABNORMAL HIGH (ref 70–99)
Glucose-Capillary: 102 mg/dL — ABNORMAL HIGH (ref 70–99)
Glucose-Capillary: 110 mg/dL — ABNORMAL HIGH (ref 70–99)

## 2013-11-09 LAB — HEPARIN LEVEL (UNFRACTIONATED): Heparin Unfractionated: 0.36 IU/mL (ref 0.30–0.70)

## 2013-11-09 LAB — ABO/RH: ABO/RH(D): O POS

## 2013-11-09 LAB — OCCULT BLOOD X 1 CARD TO LAB, STOOL: Fecal Occult Bld: POSITIVE — AB

## 2013-11-09 LAB — VITAMIN B12: Vitamin B-12: 833 pg/mL (ref 211–911)

## 2013-11-09 LAB — TROPONIN I: Troponin I: 3.78 ng/mL (ref <0.30)

## 2013-11-09 LAB — PROTIME-INR
INR: 1.13 (ref 0.00–1.49)
PROTHROMBIN TIME: 14.3 s (ref 11.6–15.2)

## 2013-11-09 LAB — PREPARE RBC (CROSSMATCH)

## 2013-11-09 LAB — MAGNESIUM: Magnesium: 2 mg/dL (ref 1.5–2.5)

## 2013-11-09 MED ORDER — SODIUM CHLORIDE 0.9 % IJ SOLN
3.0000 mL | Freq: Two times a day (BID) | INTRAMUSCULAR | Status: DC
Start: 2013-11-09 — End: 2013-11-10
  Administered 2013-11-09: 3 mL via INTRAVENOUS

## 2013-11-09 MED ORDER — ASPIRIN 81 MG PO CHEW
81.0000 mg | CHEWABLE_TABLET | ORAL | Status: AC
  Administered 2013-11-10: 81 mg via ORAL
  Filled 2013-11-09: qty 1

## 2013-11-09 MED ORDER — SODIUM CHLORIDE 0.9 % IV SOLN
510.0000 mg | Freq: Once | INTRAVENOUS | Status: AC
  Administered 2013-11-09: 510 mg via INTRAVENOUS
  Filled 2013-11-09: qty 17

## 2013-11-09 MED ORDER — HEPARIN BOLUS VIA INFUSION
3000.0000 [IU] | Freq: Once | INTRAVENOUS | Status: AC
  Administered 2013-11-09: 3000 [IU] via INTRAVENOUS
  Filled 2013-11-09: qty 3000

## 2013-11-09 MED ORDER — PANTOPRAZOLE SODIUM 40 MG PO TBEC
40.0000 mg | DELAYED_RELEASE_TABLET | Freq: Every day | ORAL | Status: DC
  Administered 2013-11-09 – 2013-11-10 (×2): 40 mg via ORAL
  Filled 2013-11-09 (×2): qty 1

## 2013-11-09 MED ORDER — SODIUM CHLORIDE 0.9 % IV SOLN: 250.0000 mL | INTRAVENOUS | Status: DC | PRN

## 2013-11-09 MED ORDER — HEPARIN (PORCINE) IN NACL 100-0.45 UNIT/ML-% IJ SOLN
700.0000 [IU]/h | INTRAMUSCULAR | Status: DC
  Administered 2013-11-09 – 2013-11-10 (×2): 700 [IU]/h via INTRAVENOUS
  Filled 2013-11-09 (×3): qty 250

## 2013-11-09 MED ORDER — SODIUM CHLORIDE 0.9 % IJ SOLN: 3.0000 mL | INTRAMUSCULAR | Status: DC | PRN

## 2013-11-09 MED ORDER — FUROSEMIDE 10 MG/ML IJ SOLN
40.0000 mg | Freq: Once | INTRAMUSCULAR | Status: AC
  Administered 2013-11-09: 40 mg via INTRAVENOUS
  Filled 2013-11-09: qty 4

## 2013-11-09 MED ORDER — FUROSEMIDE 10 MG/ML IJ SOLN
40.0000 mg | Freq: Once | INTRAMUSCULAR | Status: AC
  Administered 2013-11-09: 40 mg via INTRAVENOUS

## 2013-11-09 NOTE — Care Management Note (Addendum)
    Page 1 of 2   11/21/2013     11:42:40 AM CARE MANAGEMENT NOTE 11/21/2013  Patient:  Emily Daniel,Emily Daniel   Account Number:  [id number]  Date Initiated:  11/09/2013  Documentation initiated by:  Junius CreamerWELL,Emily  Subjective/Objective Assessment:   adm w mi     Action/Plan:   lives alone, pcp dr Myrene Buddyyvonne Daniel   Anticipated DC Date:  11/21/2013   Anticipated DC Plan:  HOME W HOME HEALTH SERVICES      DC Planning Services  CM consult      [facility]   Choice offered to / List presented to:  C-1 Patient   DME arranged  [facility]      DME agency  [facility]     [facility]  [facility]  HH-2 PT  HH-3 OT      [facility]  [facility]   Status of service:  Completed, signed off Medicare Important Message given?  YES (If response is "NO", the following Medicare IM given date fields will be blank) Date Medicare IM given:  11/17/2013 Date Additional Medicare IM given:  11/19/2013  Discharge Disposition:  HOME W HOME HEALTH SERVICES  Per UR Regulation:  Reviewed for med. necessity/level of care/duration of stay  If discussed at Long Length of Stay Meetings, dates discussed:   11/18/2013    Comments:  11/21/13 11:35 CM requested HHPT/OT/RN orders with [facility].  [facility] notified of discharge.  As soon as orders are placed, pt will be set up with HHPT/OT/RN.  DME to be delivered to room prior to discharge.  No other CM needs were communicated.  Emily JakschSarah Daniel, BSN, [place] [phone].  11/19/13 Emily AceJulie Amerson, RN, BSN [phone] Pt will need HH follow up at dc; [facility].  Start of care 24-48h post dc date.  Requests RW for home.  [facility].  11-17-13 8:35am Emily ArenasSarah Daniel, RNBSN [phone] Remains on amio drip for post op Afib - fluid overload - IV lasix.  Lives alone but sons plan on having someone with her 24/7 on discharge and are arranging.  Son Emily Daniel- Bobby in room with patient.  Confirmed plan is for her to go to her  home and he is arranging someone to be with her 24/7. [facility]. [facility] [place].  CM will continue to follow.  11/12/13 1122 Emily Mayo RN MSN BSN CCM S/P CABG, remains on vent.  No family present.  6/16 0955 Emily dowell rn,bsn pt in w heart failure. will moniter for [facility]. will assist as pt progresses.

## 2013-11-09 NOTE — Progress Notes (Signed)
ANTICOAGULATION CONSULT NOTE - Follow Up Consult  Pharmacy Consult for Heparin Indication: chest pain/ACS  Allergies  Allergen Reactions  . Meperidine Hcl   . Sulfonamide Derivatives     Patient Measurements: Height: 5' (152.4 cm) Weight: 127 lb 4.8 oz (57.743 kg) IBW/kg (Calculated) : 45.5  Vital Signs: Temp: 98.1 F (36.7 C) (06/16 0800) Temp src: Oral (06/16 0800) BP: 118/54 mmHg (06/16 0800) Pulse Rate: 95 (06/16 0300)  Labs:  Recent Labs  11/08/13 1030 11/08/13 1554 11/08/13 2125 11/09/13 0310 11/09/13 0740  HGB 8.7*  --   --  8.3*  --   HCT 28.4*  --   --  26.4*  --   PLT 252  --   --  227  --   HEPARINUNFRC  --   --   --   --  0.36  CREATININE 0.96  --   --  1.09  --   TROPONINI 1.08* 3.53* 4.86* 3.78*  --     Estimated Creatinine Clearance: 32.2 ml/min (by C-G formula based on Cr of 1.09).   Medical History: Past Medical History  Diagnosis Date  . Hyperlipidemia   . Hypertension   . Hemorrhoids   . AVM (arteriovenous malformation)   . Diabetes mellitus, type 2   . Anxiety   . PVD (peripheral vascular disease)   . Osteoporosis   . Vitamin B12 deficiency   . Cataract     Medications:  Prescriptions prior to admission  Medication Sig Dispense Refill  . aspirin 81 MG tablet Take 81 mg by mouth daily.        . Calcium Carbonate-Vitamin D (CALCIUM-VITAMIN D) 600-200 MG-UNIT CAPS Take by mouth 2 (two) times daily.        [name]. Casanthranol-Docusate Sodium 30-100 MG CAPS Take 1 capsule by mouth daily as needed. As needed for constipation      . ibuprofen (ADVIL,MOTRIN) 400 MG tablet Take 400 mg by mouth every 6 (six) hours as needed for mild pain.       . Lancets MISC by Does not apply route.        [name]. lisinopril (PRINIVIL,ZESTRIL) 20 MG tablet Take 20 mg by mouth daily.      . metFORMIN (GLUCOPHAGE XR) 500 MG 24 hr tablet 1 tablet (500 mg) by mouth every night.  Recheck labs in 3 months (11/2013).  30 tablet  2  . Multiple Vitamin (MULTIVITAMIN) tablet  Take 1 tablet by mouth daily.        . simvastatin (ZOCOR) 40 MG tablet Take 40 mg by mouth daily.      . vitamin B-12 (CYANOCOBALAMIN) 1000 MCG tablet Take 1,000 mcg by mouth daily.        [name]. glucose blood (ONE TOUCH ULTRA TEST) test strip TEST TWICE DAILY Dx 250.02  100 each  9    Assessment: 80 y.o. female with chest pain, elevated cardiac markers, plan for cath in am.  Started on heparin drip 700 uts/hr HL 0.36 at goal.  No bleeding noted but guiac stool pending d/t drop in h/h from outpt 74mo ago.   Low Fe studies - replace 510mg  IV x1 would repeat this week and then start po Fe at d/c  Goal of Therapy:  Heparin level 0.3-0.7 units/ml Monitor platelets by anticoagulation protocol: Yes   Plan:  Heparin 700 uts/hr  Daily CBC, HL  Leota SauersLisa Johniya Durfee Pharm.D. CPP, BCPS Clinical Pharmacist [phone] 11/09/2013 10:28 AM

## 2013-11-09 NOTE — Clinical Documentation Improvement (Signed)
Please clarify type of documented Acute CHF. Thank you.  Possible Clinical Conditions?  Acute Systolic Congestive Heart Failure Acute Diastolic Congestive Heart Failure Acute Systolic & Diastolic Congestive Heart Failure Other Condition________________________________________ Cannot Clinically Determine  Supporting Information:  Risk Factors: H&P: new onset CHF requiring bipap Congestive heart failure  The patient currently feels better after being on BiPAP. She was given 40mg  of IV lasix which we will continue BID with another dose today. BNP is elevated at 4174.0. We will order a 2D echo. Monitor potassium. Continue ACE.  Cards consult note:Admitted yesterday with CHF/NSTEMI.  Signs & Symptoms: Shortness of breath Scattered basilar crackles  Diagnostics: CXR:  Pulm edema  Last Echo: EF 30=35% BNP:  4174  Treatment: IV Lasix 40 mg Daily weight Strict I&O      Thank You, Harless Littenebora T Williams ,RN Clinical Documentation Specialist:  [phone]  [facility] Health Information Management

## 2013-11-09 NOTE — Progress Notes (Signed)
[facility]U [facility]  Emily MilchMary R Daniel ZOX:[id number] DOB: [birth year] DOA: 11/08/2013 PCP: Loreen FreudYvonne Lowne, DO  Brief narrative: 80 yo female with 2 days of SOB and chest pain. Patient endorsed 1-2 weeks of LE swelling.  In the ER, patient was found to be in respiratory distress and was placed on Bipap. She was admitted by Cardiology for a NSTEMI and acute CHF (no prior history). She was given lasix with improvement in the ER and [facility]   Hospitalist Service was consulted for DM and anemia.  Patient had colonoscopy in Feb 2014 with AVMs and hemorrhoids found. No further work up or bleeding since.   HPI/Subjective: No complaints- no recent issues with obvious bleeding or dark stools.    Assessment/Plan:  DIABETES MELLITUS, TYPE II, UNCONTROLLED -CBGs well controlled at this time - follow w/o change in tx  -Hgb A1c 5.9  Iron deficiency anemia -took iron briefly but stopped due to nausea -suspect this has contributed to progressive anemia since no recent bleeding reported by the patient -IV iron x 2 doses now -will need to resume iron replacement upon dc- consider polysaccharide formulation since better tolerated from a GI standpoint- also rec qHS dosing -not having any bleeding sx's on IV Heparin -TSH normal -Fecal occult blood pending -transfusion in setting of presumed CAD w/ Hgb right at/just below threshold of 8.0 is reasonable  HYPERTENSION -per Cardiology  HYPERLIPIDEMIA -per Cardiology   Chest pain/NSTEMI -cardiac cath planned   Acute heart failure (New Onset) -EF low 30-35% with diffuse hypokinesis -per Cards  DVT prophylaxis: IV heparin Code Status: Full Family Communication: Son at bedside Disposition Plan/Expected LOS: At discretion of primary team   Procedures: Cardiac catheterization pending  Antibiotics: None  Objective: Blood pressure 107/59, pulse 95, temperature 97.3 F (36.3 C),  temperature source Oral, resp. rate 24, height 5' (1.524 m), weight 127 lb 4.8 oz (57.743 kg), SpO2 100.00%.  Intake/Output Summary (Last 24 hours) at 11/09/13 1228 Last data filed at 11/09/13 1100  Gross per 24 hour  Intake 342.33 ml  Output   2655 ml  Net -2312.67 ml   Exam: General: No acute respiratory distress Lungs: Clear to auscultation bilaterally without wheezes or crackles, RA Cardiovascular: Regular rate and rhythm without murmur gallop or rub normal S1 and S2 Abdomen: Nontender, nondistended, soft, bowel sounds positive, no rebound, no ascites, no appreciable mass Musculoskeletal: No significant cyanosis, clubbing of bilateral lower extremities   Scheduled Meds:  Scheduled Meds: . aspirin EC  81 mg Oral Daily  . carvedilol  3.125 mg Oral BID WC  . ferumoxytol  510 mg Intravenous Once  . furosemide  40 mg Intravenous BID  . insulin aspart  0-9 Units Subcutaneous TID WC  . lisinopril  5 mg Oral Daily  . pantoprazole  40 mg Oral Daily  . potassium chloride  40 mEq Oral Daily  . simvastatin  40 mg Oral Daily  . sodium chloride  3 mL Intravenous Q12H   Continuous Infusions: . heparin 700 Units/hr (11/09/13 0040)    Data Reviewed: Basic Metabolic Panel:  Recent Labs Lab 11/08/13 1030 11/08/13 2125 11/09/13 0310  NA 145  --  143  K 4.5 3.9 3.9  CL 108  --  102  CO2 18*  --  23  GLUCOSE 217*  --  162*  BUN 17  --  20  CREATININE 0.96  --  1.09  CALCIUM 9.5  --  9.6  MG  --  1.9 2.0   Liver Function Tests:  Recent Labs Lab 11/08/13 1030  AST 34  ALT 29  ALKPHOS 57  BILITOT 0.7  PROT 7.1  ALBUMIN 3.8   CBC:  Recent Labs Lab 11/08/13 1030 11/09/13 0310 11/09/13 0740  WBC 9.4 8.5 7.5  NEUTROABS 7.2  --   --   HGB 8.7* 8.3* 7.9*  HCT 28.4* 26.4* 25.4*  MCV 95.9 94.3 94.8  PLT 252 227 215   Cardiac Enzymes:  Recent Labs Lab 11/08/13 1030 11/08/13 1554 11/08/13 2125 11/09/13 0310  TROPONINI 1.08* 3.53* 4.86* 3.78*   BNP (last 3  results)  Recent Labs  11/08/13 1030  PROBNP 4174.0*   CBG:  Recent Labs Lab 11/08/13 1800 11/08/13 2155 11/09/13 0806 11/09/13 1135  GLUCAP 161* 136* 133* 114*    Recent Results (from the past 240 hour(s))  MRSA PCR SCREENING     Status: None   Collection Time    11/08/13  7:10 PM      Result Value Ref Range Status   MRSA by PCR NEGATIVE  NEGATIVE Final   Comment:            The GeneXpert MRSA Assay (FDA     approved for NASAL specimens     only), is one component of a     comprehensive MRSA colonization     surveillance program. It is not     intended to diagnose MRSA     infection nor to guide or     monitor treatment for     MRSA infections.     Studies:  Recent x-ray studies have been reviewed in detail by the Attending Physician  Time spent :  35 mins     Junious Silkllison Ellis, ANP [facility] Office  [phone] Pager [phone]   **If unable to reach the above Burton Gahan after paging please contact the Flow Manager @ [phone]  On-Call/Text Page:      [email or web address]      password [email or web address]  If 7PM-7AM, please contact night-coverage [email or web address] Password [email or web address] 11/09/2013, 12:28 PM   LOS: 1 day   I have personally examined this patient and reviewed the entire database. I have reviewed the above note, made any necessary editorial changes, and agree with its content.  Lonia BloodJeffrey T. McClung, MD [facility]

## 2013-11-09 NOTE — Progress Notes (Signed)
  Per Dr. Allyson SabalBerry, orders have been placed for the patient to receive a total of 2 units of blood. Order was placed for her to receive 1 unit over 4 hrs, followed by a dose of 40 mg of IV Lasix. We will repeat the same process of a second unit of blood over 4hr, followed by another dose of IV Lasix. We will recheck a CBC 6 hrs post transfusion. I have contacted her RN and she is aware of our plan, as outlined above.   Robbie LisBrittainy Simmons, PA-C

## 2013-11-09 NOTE — Progress Notes (Signed)
Rising  troponin levels from this am. md aware of markers. No orders for heparin yet.

## 2013-11-09 NOTE — Progress Notes (Signed)
Subjective:  Feeling much better this AM. No CP/SOB  Objective:  Temp:  [98.1 F (36.7 C)-98.9 F (37.2 C)] 98.1 F (36.7 C) (06/16 0800) Pulse Rate:  [64-125] 95 (06/16 0300) Resp:  [8-33] 23 (06/16 0800) BP: (101-145)/(53-106) 118/54 mmHg (06/16 0800) SpO2:  [91 %-100 %] 100 % (06/16 0800) Weight:  [127 lb 4.8 oz (57.743 kg)-129 lb 4.8 oz (58.65 kg)] 127 lb 4.8 oz (57.743 kg) (06/16 0300) Weight change:   Intake/Output from previous day: 06/15 0701 - 06/16 0700 In: 293.3 [P.O.:240; I.V.:53.3] Out: 2430 [Urine:2430]  Intake/Output from this shift: Total I/O In: 35 [I.V.:35] Out: 400 [Urine:400]  Physical Exam: General appearance: alert and no distress Neck: no adenopathy, no carotid bruit, no JVD, supple, symmetrical, trachea midline and thyroid not enlarged, symmetric, no tenderness/mass/nodules Lungs: Basilar crackles Heart: regular rate and rhythm, S1, S2 normal, no murmur, click, rub or gallop Extremities: extremities normal, atraumatic, no cyanosis or edema  Lab Results: Results for orders placed during the hospital encounter of 11/08/13 (from the past 48 hour(s))  CBC WITH DIFFERENTIAL     Status: Abnormal   Collection Time    11/08/13 10:30 AM      Result Value Ref Range   WBC 9.4  4.0 - 10.5 K/uL   RBC 2.96 (*) 3.87 - 5.11 MIL/uL   Hemoglobin 8.7 (*) 12.0 - 15.0 g/dL   HCT 28.4 (*) 36.0 - 46.0 %   MCV 95.9  78.0 - 100.0 fL   MCH 29.4  26.0 - 34.0 pg   MCHC 30.6  30.0 - 36.0 g/dL   RDW 13.3  11.5 - 15.5 %   Platelets 252  150 - 400 K/uL   Neutrophils Relative % 77  43 - 77 %   Neutro Abs 7.2  1.7 - 7.7 K/uL   Lymphocytes Relative 15  12 - 46 %   Lymphs Abs 1.4  0.7 - 4.0 K/uL   Monocytes Relative 8  3 - 12 %   Monocytes Absolute 0.8  0.1 - 1.0 K/uL   Eosinophils Relative 0  0 - 5 %   Eosinophils Absolute 0.0  0.0 - 0.7 K/uL   Basophils Relative 0  0 - 1 %   Basophils Absolute 0.0  0.0 - 0.1 K/uL  COMPREHENSIVE METABOLIC PANEL     Status:  Abnormal   Collection Time    11/08/13 10:30 AM      Result Value Ref Range   Sodium 145  137 - 147 mEq/L   Potassium 4.5  3.7 - 5.3 mEq/L   Chloride 108  96 - 112 mEq/L   CO2 18 (*) 19 - 32 mEq/L   Glucose, Bld 217 (*) 70 - 99 mg/dL   BUN 17  6 - 23 mg/dL   Creatinine, Ser 0.96  0.50 - 1.10 mg/dL   Calcium 9.5  8.4 - 10.5 mg/dL   Total Protein 7.1  6.0 - 8.3 g/dL   Albumin 3.8  3.5 - 5.2 g/dL   AST 34  0 - 37 U/L   ALT 29  0 - 35 U/L   Alkaline Phosphatase 57  39 - 117 U/L   Total Bilirubin 0.7  0.3 - 1.2 mg/dL   GFR calc non Af Amer 54 (*) >90 mL/min   GFR calc Af Amer 63 (*) >90 mL/min   Comment: (NOTE)     The eGFR has been calculated using the CKD EPI equation.     This calculation  has not been validated in all clinical situations.     eGFR's persistently <90 mL/min signify possible Chronic Kidney     Disease.  PRO B NATRIURETIC PEPTIDE     Status: Abnormal   Collection Time    11/08/13 10:30 AM      Result Value Ref Range   Pro B Natriuretic peptide (BNP) 4174.0 (*) 0 - 450 pg/mL  TROPONIN I     Status: Abnormal   Collection Time    11/08/13 10:30 AM      Result Value Ref Range   Troponin I 1.08 (*) <0.30 ng/mL   Comment:            Due to the release kinetics of cTnI,     a negative result within the first hours     of the onset of symptoms does not rule out     myocardial infarction with certainty.     If myocardial infarction is still suspected,     repeat the test at appropriate intervals.     CRITICAL RESULT CALLED TO, READ BACK BY AND VERIFIED WITH:     R.BROWNING,PA 11/08/13 1150 BY BSLADE  I-STAT TROPOININ, ED     Status: Abnormal   Collection Time    11/08/13 10:42 AM      Result Value Ref Range   Troponin i, poc 0.49 (*) 0.00 - 0.08 ng/mL   Comment NOTIFIED PHYSICIAN     Comment 3            Comment: Due to the release kinetics of cTnI,     a negative result within the first hours     of the onset of symptoms does not rule out     myocardial  infarction with certainty.     If myocardial infarction is still suspected,     repeat the test at appropriate intervals.  IRON AND TIBC     Status: Abnormal   Collection Time    11/08/13  3:54 PM      Result Value Ref Range   Iron 23 (*) 42 - 135 ug/dL   TIBC 424  250 - 470 ug/dL   Saturation Ratios 5 (*) 20 - 55 %   UIBC 401 (*) 125 - 400 ug/dL   Comment: Performed at [place]     Status: None   Collection Time    11/08/13  3:54 PM      Result Value Ref Range   Ferritin 11  10 - 291 ng/mL   Comment: Performed at [facility]  TROPONIN I     Status: Abnormal   Collection Time    11/08/13  3:54 PM      Result Value Ref Range   Troponin I 3.53 (*) <0.30 ng/mL   Comment:            Due to the release kinetics of cTnI,     a negative result within the first hours     of the onset of symptoms does not rule out     myocardial infarction with certainty.     If myocardial infarction is still suspected,     repeat the test at appropriate intervals.     CRITICAL VALUE NOTED.  VALUE IS CONSISTENT WITH PREVIOUSLY REPORTED AND CALLED VALUE.  VITAMIN B12     Status: Abnormal   Collection Time    11/08/13  3:54 PM      Result Value Ref Range  Vitamin B-12 979 (*) 211 - 911 pg/mL   Comment: Performed at [facility]  CBG MONITORING, ED     Status: Abnormal   Collection Time    11/08/13  6:00 PM      Result Value Ref Range   Glucose-Capillary 161 (*) 70 - 99 mg/dL   Comment 1 Documented in Chart     Comment 2 Notify RN    MRSA PCR SCREENING     Status: None   Collection Time    11/08/13  7:10 PM      Result Value Ref Range   MRSA by PCR NEGATIVE  NEGATIVE   Comment:            The GeneXpert MRSA Assay (FDA     approved for NASAL specimens     only), is one component of a     comprehensive MRSA colonization     surveillance program. It is not     intended to diagnose MRSA     infection nor to guide or     monitor treatment for     MRSA  infections.  TSH     Status: None   Collection Time    11/08/13  9:25 PM      Result Value Ref Range   TSH 1.310  0.350 - 4.500 uIU/mL  TROPONIN I     Status: Abnormal   Collection Time    11/08/13  9:25 PM      Result Value Ref Range   Troponin I 4.86 (*) <0.30 ng/mL   Comment:            Due to the release kinetics of cTnI,     a negative result within the first hours     of the onset of symptoms does not rule out     myocardial infarction with certainty.     If myocardial infarction is still suspected,     repeat the test at appropriate intervals.     CRITICAL VALUE NOTED.  VALUE IS CONSISTENT WITH PREVIOUSLY REPORTED AND CALLED VALUE.  MAGNESIUM     Status: None   Collection Time    11/08/13  9:25 PM      Result Value Ref Range   Magnesium 1.9  1.5 - 2.5 mg/dL  POTASSIUM     Status: None   Collection Time    11/08/13  9:25 PM      Result Value Ref Range   Potassium 3.9  3.7 - 5.3 mEq/L  GLUCOSE, CAPILLARY     Status: Abnormal   Collection Time    11/08/13  9:55 PM      Result Value Ref Range   Glucose-Capillary 136 (*) 70 - 99 mg/dL  TROPONIN I     Status: Abnormal   Collection Time    11/09/13  3:10 AM      Result Value Ref Range   Troponin I 3.78 (*) <0.30 ng/mL   Comment:            Due to the release kinetics of cTnI,     a negative result within the first hours     of the onset of symptoms does not rule out     myocardial infarction with certainty.     If myocardial infarction is still suspected,     repeat the test at appropriate intervals.     CRITICAL VALUE NOTED.  VALUE IS CONSISTENT WITH PREVIOUSLY REPORTED AND CALLED VALUE.  CBC  Status: Abnormal   Collection Time    11/09/13  3:10 AM      Result Value Ref Range   WBC 8.5  4.0 - 10.5 K/uL   RBC 2.80 (*) 3.87 - 5.11 MIL/uL   Hemoglobin 8.3 (*) 12.0 - 15.0 g/dL   HCT 26.4 (*) 36.0 - 46.0 %   MCV 94.3  78.0 - 100.0 fL   MCH 29.6  26.0 - 34.0 pg   MCHC 31.4  30.0 - 36.0 g/dL   RDW 13.2  11.5 -  15.5 %   Platelets 227  150 - 400 K/uL  BASIC METABOLIC PANEL     Status: Abnormal   Collection Time    11/09/13  3:10 AM      Result Value Ref Range   Sodium 143  137 - 147 mEq/L   Potassium 3.9  3.7 - 5.3 mEq/L   Chloride 102  96 - 112 mEq/L   CO2 23  19 - 32 mEq/L   Glucose, Bld 162 (*) 70 - 99 mg/dL   BUN 20  6 - 23 mg/dL   Creatinine, Ser 1.09  0.50 - 1.10 mg/dL   Calcium 9.6  8.4 - 10.5 mg/dL   GFR calc non Af Amer 46 (*) >90 mL/min   GFR calc Af Amer 54 (*) >90 mL/min   Comment: (NOTE)     The eGFR has been calculated using the CKD EPI equation.     This calculation has not been validated in all clinical situations.     eGFR's persistently <90 mL/min signify possible Chronic Kidney     Disease.  MAGNESIUM     Status: None   Collection Time    11/09/13  3:10 AM      Result Value Ref Range   Magnesium 2.0  1.5 - 2.5 mg/dL  HEPARIN LEVEL (UNFRACTIONATED)     Status: None   Collection Time    11/09/13  7:40 AM      Result Value Ref Range   Heparin Unfractionated 0.36  0.30 - 0.70 IU/mL   Comment:            IF HEPARIN RESULTS ARE BELOW     EXPECTED VALUES, AND PATIENT     DOSAGE HAS BEEN CONFIRMED,     SUGGEST FOLLOW UP TESTING     OF ANTITHROMBIN III LEVELS.  GLUCOSE, CAPILLARY     Status: Abnormal   Collection Time    11/09/13  8:06 AM      Result Value Ref Range   Glucose-Capillary 133 (*) 70 - 99 mg/dL    Imaging: Imaging results have been reviewed  Assessment/Plan:   1. Principal Problem: 2.   Congestive heart failure 3. Active Problems: 4.   DIABETES MELLITUS, TYPE II, UNCONTROLLED 5.   HYPERLIPIDEMIA 6.   HYPERTENSION 7.   Chest pain 8.   NSTEMI (non-ST elevated myocardial infarction) 9.   Anemia 10.   Acute heart failure 11.   Time Spent Directly with Patient:  30 minutes  Length of Stay:  LOS: 1 day   Admitted yesterday with CHF/NSTEMI. Peak troponin about 5.0  and BNP 4000. She had CP/SOB. She required BiPAP and IV diuretics. I/O neg  2.5 liters . She feels much better. Exam is benign except for scattered basilar crackles. CXR was c/w pulm edema and 2D showed EF 30% with severe MR (? Ischemic). On BB, ACE-I and diuretic as well as IV hep. Other major issue is anemia with Hgb  8.3 (was >11 in 11/14). H/O AVMs. This poses an issue with need for cath and possible anticoagulation and will need to be taken into consideration (i.e BMS vs DES). Will give IV FE, stool guiac. Needs R/L heart cath tomorrow. Discussed with Pt and Son.   Emily Daniel J 11/09/2013, 10:20 AM

## 2013-11-09 NOTE — Progress Notes (Signed)
Pt has severe leg cramps which is chronic but has gotten significantly worse since admission. Patients literally jumps out of bed during the cramp spell. Magnesium and potassium were replaced but did not relive her symptoms.

## 2013-11-10 ENCOUNTER — Encounter (HOSPITAL_COMMUNITY)
Admission: EM | Disposition: A | Discharge status: Home Health | Payer: Self-pay | Source: Home / Self Care | Attending: Thoracic Surgery (Cardiothoracic Vascular Surgery)

## 2013-11-10 DIAGNOSIS — E785 Hyperlipidemia, unspecified: Secondary | ICD-10-CM

## 2013-11-10 DIAGNOSIS — I739 Peripheral vascular disease, unspecified: Secondary | ICD-10-CM

## 2013-11-10 DIAGNOSIS — I2510 Atherosclerotic heart disease of native coronary artery without angina pectoris: Secondary | ICD-10-CM

## 2013-11-10 DIAGNOSIS — Q211 Atrial septal defect: Secondary | ICD-10-CM

## 2013-11-10 DIAGNOSIS — E119 Type 2 diabetes mellitus without complications: Secondary | ICD-10-CM

## 2013-11-10 DIAGNOSIS — Q2111 Secundum atrial septal defect: Secondary | ICD-10-CM

## 2013-11-10 HISTORY — PX: LEFT AND RIGHT HEART CATHETERIZATION WITH CORONARY ANGIOGRAM: SHX5449

## 2013-11-10 LAB — BASIC METABOLIC PANEL
BUN: 33 mg/dL — ABNORMAL HIGH (ref 6–23)
CALCIUM: 9.5 mg/dL (ref 8.4–10.5)
CHLORIDE: 102 meq/L (ref 96–112)
CO2: 22 meq/L (ref 19–32)
Creatinine, Ser: 1.38 mg/dL — ABNORMAL HIGH (ref 0.50–1.10)
GFR calc Af Amer: 40 mL/min — ABNORMAL LOW (ref >90)
GFR calc non Af Amer: 35 mL/min — ABNORMAL LOW (ref >90)
GLUCOSE: 137 mg/dL — AB (ref 70–99)
Potassium: 4.4 mEq/L (ref 3.7–5.3)
SODIUM: 141 meq/L (ref 137–147)

## 2013-11-10 LAB — CBC
HCT: 32.9 % — ABNORMAL LOW (ref 36.0–46.0)
HEMATOCRIT: 33.7 % — AB (ref 36.0–46.0)
HEMATOCRIT: 35 % — AB (ref 36.0–46.0)
HEMOGLOBIN: 10.8 g/dL — AB (ref 12.0–15.0)
HEMOGLOBIN: 11.2 g/dL — AB (ref 12.0–15.0)
Hemoglobin: 10.6 g/dL — ABNORMAL LOW (ref 12.0–15.0)
MCH: 28.8 pg (ref 26.0–34.0)
MCH: 28.7 pg (ref 26.0–34.0)
MCH: 28.9 pg (ref 26.0–34.0)
MCHC: 32 g/dL (ref 30.0–36.0)
MCHC: 32.2 g/dL (ref 30.0–36.0)
MCHC: 32 g/dL (ref 30.0–36.0)
MCV: 89.9 fL (ref 78.0–100.0)
MCV: 89.2 fL (ref 78.0–100.0)
MCV: 90.2 fL (ref 78.0–100.0)
Platelets: 205 10*3/uL (ref 150–400)
Platelets: 207 10*3/uL (ref 150–400)
Platelets: 215 10*3/uL (ref 150–400)
RBC: 3.75 MIL/uL — ABNORMAL LOW (ref 3.87–5.11)
RBC: 3.69 MIL/uL — AB (ref 3.87–5.11)
RBC: 3.88 MIL/uL (ref 3.87–5.11)
RDW: 16.1 % — ABNORMAL HIGH (ref 11.5–15.5)
RDW: 16.1 % — ABNORMAL HIGH (ref 11.5–15.5)
RDW: 16.2 % — ABNORMAL HIGH (ref 11.5–15.5)
WBC: 8.1 10*3/uL (ref 4.0–10.5)
WBC: 6.8 10*3/uL (ref 4.0–10.5)
WBC: 9 10*3/uL (ref 4.0–10.5)

## 2013-11-10 LAB — GLUCOSE, CAPILLARY
GLUCOSE-CAPILLARY: 188 mg/dL — AB (ref 70–99)
Glucose-Capillary: 143 mg/dL — ABNORMAL HIGH (ref 70–99)
Glucose-Capillary: 121 mg/dL — ABNORMAL HIGH (ref 70–99)

## 2013-11-10 LAB — PLATELET INHIBITION P2Y12: Platelet Function  P2Y12: 300 [PRU] (ref 194–418)

## 2013-11-10 LAB — BLOOD GAS, ARTERIAL
ACID-BASE DEFICIT: 3 mmol/L — AB (ref 0.0–2.0)
Bicarbonate: 20.9 mEq/L (ref 20.0–24.0)
Drawn by: 39898
FIO2: 0.21 %
O2 SAT: 91.3 %
PCO2 ART: 34.2 mmHg — AB (ref 35.0–45.0)
PO2 ART: 66.7 mmHg — AB (ref 80.0–100.0)
Patient temperature: 98.6
TCO2: 22 mmol/L (ref 0–100)
pH, Arterial: 7.404 (ref 7.350–7.450)

## 2013-11-10 LAB — POCT I-STAT 3, ART BLOOD GAS (G3+)
Acid-base deficit: 1 mmol/L (ref 0.0–2.0)
Bicarbonate: 22.7 mEq/L (ref 20.0–24.0)
O2 Saturation: 93 %
PH ART: 7.418 (ref 7.350–7.450)
PO2 ART: 64 mmHg — AB (ref 80.0–100.0)
TCO2: 24 mmol/L (ref 0–100)
pCO2 arterial: 35.2 mmHg (ref 35.0–45.0)

## 2013-11-10 LAB — POCT I-STAT 3, VENOUS BLOOD GAS (G3P V)
Acid-base deficit: 2 mmol/L (ref 0.0–2.0)
BICARBONATE: 23.1 meq/L (ref 20.0–24.0)
O2 Saturation: 66 %
PH VEN: 7.393 — AB (ref 7.250–7.300)
PO2 VEN: 35 mmHg (ref 30.0–45.0)
TCO2: 24 mmol/L (ref 0–100)
pCO2, Ven: 38 mmHg — ABNORMAL LOW (ref 45.0–50.0)

## 2013-11-10 LAB — HEPARIN LEVEL (UNFRACTIONATED): Heparin Unfractionated: 0.33 IU/mL (ref 0.30–0.70)

## 2013-11-10 LAB — POCT ACTIVATED CLOTTING TIME: Activated Clotting Time: 105 seconds

## 2013-11-10 LAB — APTT: APTT: 23 s — AB (ref 24–37)

## 2013-11-10 LAB — PROTIME-INR
INR: 1.14 (ref 0.00–1.49)
Prothrombin Time: 14.4 seconds (ref 11.6–15.2)

## 2013-11-10 SURGERY — LEFT AND RIGHT HEART CATHETERIZATION WITH CORONARY ANGIOGRAM
Anesthesia: LOCAL

## 2013-11-10 MED ORDER — NITROGLYCERIN IN D5W 200-5 MCG/ML-% IV SOLN
2.0000 ug/min | INTRAVENOUS | Status: DC
  Filled 2013-11-10: qty 250

## 2013-11-10 MED ORDER — INSULIN REGULAR HUMAN 100 UNIT/ML IJ SOLN
INTRAMUSCULAR | Status: DC
  Filled 2013-11-10: qty 1

## 2013-11-10 MED ORDER — HEPARIN (PORCINE) IN NACL 100-0.45 UNIT/ML-% IJ SOLN
700.0000 [IU]/h | INTRAMUSCULAR | Status: DC
Start: 2013-11-10 — End: 2013-11-10

## 2013-11-10 MED ORDER — LIDOCAINE HCL (PF) 1 % IJ SOLN
INTRAMUSCULAR | Status: AC
  Filled 2013-11-10: qty 30

## 2013-11-10 MED ORDER — HEPARIN (PORCINE) IN NACL 2-0.9 UNIT/ML-% IJ SOLN
INTRAMUSCULAR | Status: AC
  Filled 2013-11-10: qty 1500

## 2013-11-10 MED ORDER — DEXMEDETOMIDINE HCL IN NACL 400 MCG/100ML IV SOLN
0.1000 ug/kg/h | INTRAVENOUS | Status: DC
  Filled 2013-11-10: qty 100

## 2013-11-10 MED ORDER — NITROGLYCERIN 0.2 MG/ML ON CALL CATH LAB
INTRAVENOUS | Status: AC
  Filled 2013-11-10: qty 1

## 2013-11-10 MED ORDER — SODIUM CHLORIDE 0.9 % IV SOLN
INTRAVENOUS | Status: DC
  Filled 2013-11-10: qty 30

## 2013-11-10 MED ORDER — FENTANYL CITRATE 0.05 MG/ML IJ SOLN
INTRAMUSCULAR | Status: AC
  Filled 2013-11-10: qty 2

## 2013-11-10 MED ORDER — DEXTROSE 5 % IV SOLN
750.0000 mg | INTRAVENOUS | Status: DC
  Filled 2013-11-10: qty 750

## 2013-11-10 MED ORDER — DIAZEPAM 2 MG PO TABS
2.0000 mg | ORAL_TABLET | Freq: Once | ORAL | Status: AC
  Administered 2013-11-11: 2 mg via ORAL
  Filled 2013-11-10: qty 1

## 2013-11-10 MED ORDER — CHLORHEXIDINE GLUCONATE CLOTH 2 % EX PADS
6.0000 | MEDICATED_PAD | Freq: Once | CUTANEOUS | Status: AC
Start: 2013-11-11 — End: 2013-11-11
  Administered 2013-11-11: 6 via TOPICAL

## 2013-11-10 MED ORDER — EPINEPHRINE HCL 1 MG/ML IJ SOLN
0.5000 ug/min | INTRAVENOUS | Status: DC
  Filled 2013-11-10: qty 4

## 2013-11-10 MED ORDER — ONDANSETRON HCL 4 MG/2ML IJ SOLN
INTRAMUSCULAR | Status: AC
  Filled 2013-11-10: qty 2

## 2013-11-10 MED ORDER — TEMAZEPAM 15 MG PO CAPS: 15.0000 mg | ORAL_CAPSULE | Freq: Once | ORAL | Status: AC | PRN

## 2013-11-10 MED ORDER — AMINOCAPROIC ACID 250 MG/ML IV SOLN
INTRAVENOUS | Status: DC
  Filled 2013-11-10: qty 40

## 2013-11-10 MED ORDER — SODIUM CHLORIDE 0.9 % IV SOLN: INTRAVENOUS | Status: DC

## 2013-11-10 MED ORDER — MIDAZOLAM HCL 2 MG/2ML IJ SOLN
INTRAMUSCULAR | Status: AC
  Filled 2013-11-10: qty 2

## 2013-11-10 MED ORDER — MAGNESIUM SULFATE 50 % IJ SOLN
40.0000 meq | INTRAMUSCULAR | Status: DC
  Filled 2013-11-10: qty 10

## 2013-11-10 MED ORDER — SODIUM CHLORIDE 0.9 % IV SOLN
INTRAVENOUS | Status: DC
  Administered 2013-11-10: 06:00:00 via INTRAVENOUS

## 2013-11-10 MED ORDER — BISACODYL 5 MG PO TBEC: 5.0000 mg | DELAYED_RELEASE_TABLET | Freq: Once | ORAL | Status: DC

## 2013-11-10 MED ORDER — PLASMA-LYTE 148 IV SOLN
INTRAVENOUS | Status: AC
  Administered 2013-11-11: 13:00:00
  Filled 2013-11-10: qty 2.5

## 2013-11-10 MED ORDER — DEXTROSE 5 % IV SOLN
1.5000 g | INTRAVENOUS | Status: AC
  Administered 2013-11-11: 1.5 g via INTRAVENOUS
  Administered 2013-11-11: .75 g via INTRAVENOUS
  Filled 2013-11-10: qty 1.5

## 2013-11-10 MED ORDER — HEPARIN (PORCINE) IN NACL 100-0.45 UNIT/ML-% IJ SOLN
700.0000 [IU]/h | INTRAMUSCULAR | Status: DC
  Administered 2013-11-10: 700 [IU]/h via INTRAVENOUS
  Filled 2013-11-10: qty 250

## 2013-11-10 MED ORDER — DOPAMINE-DEXTROSE 3.2-5 MG/ML-% IV SOLN
2.0000 ug/kg/min | INTRAVENOUS | Status: DC
  Filled 2013-11-10: qty 250

## 2013-11-10 MED ORDER — VANCOMYCIN HCL 1000 MG IV SOLR
1000.0000 mg | INTRAVENOUS | Status: AC
  Administered 2013-11-11: 1000 mg via INTRAVENOUS
  Filled 2013-11-10: qty 1000

## 2013-11-10 MED ORDER — CHLORHEXIDINE GLUCONATE CLOTH 2 % EX PADS
6.0000 | MEDICATED_PAD | Freq: Once | CUTANEOUS | Status: AC
  Administered 2013-11-10: 6 via TOPICAL

## 2013-11-10 MED ORDER — METOPROLOL TARTRATE 12.5 MG HALF TABLET
12.5000 mg | ORAL_TABLET | Freq: Once | ORAL | Status: AC
  Administered 2013-11-11: 12.5 mg via ORAL
  Filled 2013-11-10 (×2): qty 1

## 2013-11-10 MED ORDER — ASPIRIN EC 81 MG PO TBEC: 81.0000 mg | DELAYED_RELEASE_TABLET | Freq: Every day | ORAL | Status: DC

## 2013-11-10 MED ORDER — PHENYLEPHRINE HCL 10 MG/ML IJ SOLN
30.0000 ug/min | INTRAVENOUS | Status: DC
  Filled 2013-11-10: qty 2

## 2013-11-10 MED ORDER — POTASSIUM CHLORIDE 2 MEQ/ML IV SOLN
80.0000 meq | INTRAVENOUS | Status: DC
  Filled 2013-11-10: qty 40

## 2013-11-10 NOTE — Interval H&P Note (Signed)
Cath Lab Visit (complete for each Cath Lab visit)  Clinical Evaluation Leading to the Procedure:   ACS: yes  Non-ACS:    Anginal Classification: CCS III  Anti-ischemic medical therapy: Minimal Therapy (1 class of medications)  Non-Invasive Test Results: No non-invasive testing performed  Prior CABG: No previous CABG      History and Physical Interval Note:  11/10/2013 4:57 PM  Emily Daniel  has presented today for surgery, with the diagnosis of non-stemi/LV disfunction  The various methods of treatment have been discussed with the patient and family. After consideration of risks, benefits and other options for treatment, the patient has consented to  Procedure(s): LEFT AND RIGHT HEART CATHETERIZATION WITH CORONARY ANGIOGRAM (N/A) as a surgical intervention .  The patient's history has been reviewed, patient examined, no change in status, stable for surgery.  I have reviewed the patient's chart and labs.  Questions were answered to the patient's satisfaction.     KELLY,THOMAS A

## 2013-11-10 NOTE — CV Procedure (Signed)
Emily MilchMary R Orrego is a 80 y.o. female   [id number]  [id number] LOCATION:  FACILITY: [facility]  PHYSICIAN: Lennette Biharihomas A. Kelly, MD, [facility] 1930/01/12   DATE OF PROCEDURE:  11/10/2013      RIGHT AND LEFT HEART CARDIAC CATHETERIZATION   HISTORY:  Emily Daniel is a 80 y.o. female history of a remote MI in 1985, history of hypertension, hyperlipidemia, type 2 diabetes mellitus, peripheral vascular disease, who was admitted with intermittent chest discomfort, but more pronounced pain, leading to her admission.  She is ruled in for non-ST segment elevation MI with troponin 4.76.  An echo Doppler study showed an ejection fraction of 30-35% with severe mitral regurgitation.  She is now referred for right and left heart catheterization for   PROCEDURE:  Right and left heart catheterization: Swan-Ganz catheterization, coronary angiography, left ventriculography, distal aortography with runoff.  The patient was brought to the cardiac cath lab in the postabsorptive state. Versed 1 mg and fentanyl 25 mcg were administered for conscious sedation. The right groin was prepped and draped in sterile fashion and a 5 French arterial sheath was inserted.  A regular J-wire, Glidewire, and versicolor wire are used to, but there appeared to be total occlusion of the iliac artery before reaching the aorta.  The left femoral artery was then punctured anteriorly and this was successful to performing the catheterization.  A pigtail catheter was advanced into the central aorta.  Central aortic pressures were recorded as was oxygen saturation.  Swan-Ganz catheterization was done via the right femoral vein after a 7 French venous sheath was inserted without difficulty.  Right heart pressures were obtained in the right atrium, right ventricle, pulmonary artery, and pulmonary capillary wedge position. The pigtail catheter was advanced into the left ventricle and simultaneous left ventricular and PCW pressures were recorded. Left  ventriculography was performed in the RAO projection.  A left ventricle to aorta pullback was performed. Cardiac outputs were obtained by the thermodilution and assumed Fick methods. Oxygen saturation was obtained in the pulmonary artery and aorta. The pigtail catheter was then pulled back to the infrarenal aorta and distal aortography was performed.  Diagnostic coronary angiography was performed utilizing 5 French Judkins 4 left and right diagnostic catheters. All catheters were removed and the patient. Hemostasis was obtained by direct manual pressure. The patient tolerated the procedure well and returned to her room in satisfactory condition.   HEMODYNAMICS:   RA: A wave 12 V wave 9, mean 8 RV: 35/10 PA: 35/16 PC: A wave 16 V wave 26, mean 17 LV: 98/18  AO: 98/45   Oxygen saturation in the aorta 93% and the pulmonary artery 66 %  Cardiac output: 2.7 l/min (Thermo); 5.2 (Fick)  Cardiac index: 1.7 l/m/m2                3.4  ANGIOGRAPHY:   Fluoroscopy revealed severe coronary calcification  Left main: Calcified vessel with 90% eccentric distal stenosis prior to bifurcating into the LAD and left circumflex vessel.  LAD: 30% proximal stenosis with mild 20% mid LAD narrowing after diagonal vessel.  Left circumflex:  99% ostial stenosis followed by 80% eccentric calcified proximal stenosis before the first marginal branch.   Right coronary artery: 99% mid subtotal occlusion with evidence for bridging collaterals.  Regarding marginal branch.  There are also faint left to right collaterals to the distal RCA  Left ventriculography  dilated left ventricle with an ejection fraction of 30 to less than 35%.  There is at least  3-4+ angiographic mitral regurgitation.  Selective angiography in the right iliac artery suggested total proximal iliac occlusion.  Distal aortography was performed, which revealed probable 50% right renal artery stenosis.  There was total occlusion of the right iliac at its  origin from the aorta.  There was distal recanalization.  There was 70% ostial left iliac stenosis   IMPRESSION:  Moderately severe ischemic cardiomyopathy with an ejection fraction of approximately 30% with also 3-4+ angiographic mitral regurgitation.  Severe coronary calcification with 90% distal left main stenosis, 30% proximal LAD stenosis, 99% ostial left circumflex stenosis, followed by 80% calcified eccentric proximal circumflex stenosis and subtotal 99% mid RCA 6 stenosis with evidence for right to right bridging collaterals as well as faint left to right distal collaterals.  Significant peripheral vascular disease with probable 50% right renal stenosis and total occlusion of the right common iliac artery at its origin from the aorta with 70% ostial left common iliac artery stenosis and evidence for distal recanalization of the right iliofemoral region.  Mild pulmonary hypertension with PA systolic pressure 35 mm.  RECOMMENDATION:  In light of the patient's severe coronary anatomy with significant mitral regurgitation surgical consultation will be obtained for consideration for candidacy for CABG revascularization surgery.  The patient will be started on heparin 4 hours following sheath removal.    Lennette Biharihomas A. Kelly, MD, [facility]ACC 11/10/2013 6:40 PM

## 2013-11-10 NOTE — Progress Notes (Signed)
[facility]U [facility]  Natale MilchMary R Milos ZOX:[id number] DOB: 03-08-1930 DOA: 11/08/2013 PCP: Loreen FreudYvonne Lowne, DO  Brief narrative: 80 yo female with 2 days of SOB and chest pain. Patient endorsed 1-2 weeks of LE swelling.  In the ER, patient was found to be in respiratory distress and was placed on Bipap. She was admitted by Cardiology for a NSTEMI and acute CHF (no prior history). She was given lasix with improvement in the ER and [facility]   Hospitalist Service was consulted for DM and anemia.  Patient had colonoscopy in Feb 2014 with AVMs and hemorrhoids found. No further work up or bleeding since.   HPI/Subjective: Alert - states feels much better - no bleeding sx's reported   Assessment/Plan:  DIABETES MELLITUS, TYPE II, UNCONTROLLED -CBGs well controlled at this time - follow closely -Hgb A1c 5.9  Iron deficiency anemia -took iron briefly but stopped due to nausea -suspect this has contributed to progressive anemia since no recent bleeding reported by the patient -IV iron x 2 doses now -will need to resume iron replacement upon dc- consider polysaccharide formulation since better tolerated from a GI standpoint- also rec q HS dosing -TSH normal -Fecal occult blood positive without frank bleeding while on IV heparin -Most recent outpatient colonoscopy from 2015 reviewed-stable AVMs and hemorrhoids and per gastroenterologist recommendation on that report due to patient's advanced age she will not need another screening colonoscopy -transfusion in setting of presumed CAD w/ Hgb right at/just below threshold of 8.0 was reasonable-hemoglobin now up to 10.6 -pt will simply need serial monitoring of her Hgb if she requires ongoing use of blood thinners   HYPERTENSION -per Cardiology  HYPERLIPIDEMIA -per Cardiology   Chest pain/NSTEMI -cardiac cath planned   Acute heart failure ([place]) -EF low 30-35% with diffuse  hypokinesis -per Cards  DVT prophylaxis: IV heparin Code Status: Full Family Communication: Son at bedside Disposition Plan/Expected LOS: At discretion of primary team  Procedures: Cardiac catheterization pending for 11/10/2048  Antibiotics: None  Objective: Blood pressure 102/53, pulse 79, temperature 98.6 F (37 C), temperature source Oral, resp. rate 19, height 5' (1.524 m), weight 126 lb (57.153 kg), SpO2 95.00%.  Intake/Output Summary (Last 24 hours) at 11/10/13 1325 Last data filed at 11/10/13 0547  Gross per 24 hour  Intake 782.17 ml  Output   2450 ml  Net -1667.83 ml   Exam: General: No acute respiratory distress-very animated Lungs: Clear to auscultation, RA Cardiovascular: Regular rate and rhythm without murmur gallop or rub normal S1 and S2-BP somewhat soft Abdomen: Nontender, nondistended, soft, bowel sounds positive, no rebound, no ascites, no appreciable mass-no recurrence of bleeding reported Musculoskeletal: No significant cyanosis, clubbing of bilateral lower extremities   Scheduled Meds:  Scheduled Meds: . aspirin EC  81 mg Oral Daily  . carvedilol  3.125 mg Oral BID WC  . furosemide  40 mg Intravenous BID  . insulin aspart  0-9 Units Subcutaneous TID WC  . lisinopril  5 mg Oral Daily  . pantoprazole  40 mg Oral Daily  . potassium chloride  40 mEq Oral Daily  . simvastatin  40 mg Oral Daily   Continuous Infusions: . sodium chloride 50 mL/hr at 11/10/13 0601  . heparin 700 Units/hr (11/10/13 0131)    Data Reviewed: Basic Metabolic Panel:  Recent Labs Lab 11/08/13 1030 11/08/13 2125 11/09/13 0310 11/10/13 0310  NA 145  --  143 141  K  4.5 3.9 3.9 4.4  CL 108  --  102 102  CO2 18*  --  23 22  GLUCOSE 217*  --  162* 137*  BUN 17  --  20 33*  CREATININE 0.96  --  1.09 1.38*  CALCIUM 9.5  --  9.6 9.5  MG  --  1.9 2.0  --    Liver Function Tests:  Recent Labs Lab 11/08/13 1030  AST 34  ALT 29  ALKPHOS 57  BILITOT 0.7  PROT 7.1   ALBUMIN 3.8   CBC:  Recent Labs Lab 11/08/13 1030 11/09/13 0310 11/09/13 0740 11/10/13 0310 11/10/13 1055  WBC 9.4 8.5 7.5 8.1 6.8  NEUTROABS 7.2  --   --   --   --   HGB 8.7* 8.3* 7.9* 10.8* 10.6*  HCT 28.4* 26.4* 25.4* 33.7* 32.9*  MCV 95.9 94.3 94.8 89.9 89.2  PLT 252 227 215 205 207   Cardiac Enzymes:  Recent Labs Lab 11/08/13 1030 11/08/13 1554 11/08/13 2125 11/09/13 0310  TROPONINI 1.08* 3.53* 4.86* 3.78*   BNP (last 3 results)  Recent Labs  11/08/13 1030  PROBNP 4174.0*   CBG:  Recent Labs Lab 11/09/13 1135 11/09/13 1643 11/09/13 2134 11/10/13 0806 11/10/13 1218  GLUCAP 114* 102* 110* 143* 121*    Recent Results (from the past 240 hour(s))  MRSA PCR SCREENING     Status: None   Collection Time    11/08/13  7:10 PM      Result Value Ref Range Status   MRSA by PCR NEGATIVE  NEGATIVE Final   Comment:            The GeneXpert MRSA Assay (FDA     approved for NASAL specimens     only), is one component of a     comprehensive MRSA colonization     surveillance program. It is not     intended to diagnose MRSA     infection nor to guide or     monitor treatment for     MRSA infections.     Studies:  Recent x-ray studies have been reviewed in detail by the Attending Physician  Time spent :  35 mins     Junious Silkllison Ellis, ANP [facility] Office  [phone] Pager [phone]   **If unable to reach the above provider after paging please contact the Flow Manager @ [phone]  On-Call/Text Page:      [email or web address]      password [email or web address]  If 7PM-7AM, please contact night-coverage [email or web address] Password [email or web address] 11/10/2013, 1:25 PM   LOS: 2 days   I have personally examined this patient and reviewed the entire database. I have reviewed the above note, made any necessary editorial changes, and agree with its content.  Lonia BloodJeffrey T. McClung, MD [facility]

## 2013-11-10 NOTE — Progress Notes (Signed)
Subjective:  No CP/SOB s/p Tx 2u PRBCs  Objective:  Temp:  [98.1 F (36.7 C)-98.7 F (37.1 C)] 98.4 F (36.9 C) (06/17 0802) Pulse Rate:  [80-99] 81 (06/17 0802) Resp:  [16-35] 22 (06/17 0802) BP: (82-223)/(39-181) 112/67 mmHg (06/17 0802) SpO2:  [92 %-98 %] 92 % (06/17 0802) Weight:  [126 lb (57.153 kg)-127 lb 4.8 oz (57.743 kg)] 126 lb (57.153 kg) (06/17 0500) Weight change: -2 lb (-0.907 kg)  Intake/Output from previous day: 06/16 0701 - 06/17 0700 In: 845.2 [I.V.:175; Blood:670.2] Out: 3301 [Urine:3300; Stool:1]  Intake/Output from this shift:    Physical Exam: General appearance: alert and no distress Neck: no adenopathy, no carotid bruit, no JVD, supple, symmetrical, trachea midline and thyroid not enlarged, symmetric, no tenderness/mass/nodules Lungs: clear to auscultation bilaterally Heart: regular rate and rhythm, S1, S2 normal, no murmur, click, rub or gallop Extremities: extremities normal, atraumatic, no cyanosis or edema  Lab Results: Results for orders placed during the hospital encounter of 11/08/13 (from the past 48 hour(s))  IRON AND TIBC     Status: Abnormal   Collection Time    11/08/13  3:54 PM      Result Value Ref Range   Iron 23 (*) 42 - 135 ug/dL   TIBC 424  250 - 470 ug/dL   Saturation Ratios 5 (*) 20 - 55 %   UIBC 401 (*) 125 - 400 ug/dL   Comment: Performed at [place]     Status: None   Collection Time    11/08/13  3:54 PM      Result Value Ref Range   Ferritin 11  10 - 291 ng/mL   Comment: Performed at [facility]  TROPONIN I     Status: Abnormal   Collection Time    11/08/13  3:54 PM      Result Value Ref Range   Troponin I 3.53 (*) <0.30 ng/mL   Comment:            Due to the release kinetics of cTnI,     a negative result within the first hours     of the onset of symptoms does not rule out     myocardial infarction with certainty.     If myocardial infarction is still suspected,   repeat the test at appropriate intervals.     CRITICAL VALUE NOTED.  VALUE IS CONSISTENT WITH PREVIOUSLY REPORTED AND CALLED VALUE.  VITAMIN B12     Status: Abnormal   Collection Time    11/08/13  3:54 PM      Result Value Ref Range   Vitamin B-12 979 (*) 211 - 911 pg/mL   Comment: Performed at [facility]  CBG MONITORING, ED     Status: Abnormal   Collection Time    11/08/13  6:00 PM      Result Value Ref Range   Glucose-Capillary 161 (*) 70 - 99 mg/dL   Comment 1 Documented in Chart     Comment 2 Notify RN    MRSA PCR SCREENING     Status: None   Collection Time    11/08/13  7:10 PM      Result Value Ref Range   MRSA by PCR NEGATIVE  NEGATIVE   Comment:            The GeneXpert MRSA Assay (FDA     approved for NASAL specimens     only), is one component of  a     comprehensive MRSA colonization     surveillance program. It is not     intended to diagnose MRSA     infection nor to guide or     monitor treatment for     MRSA infections.  TSH     Status: None   Collection Time    11/08/13  9:25 PM      Result Value Ref Range   TSH 1.310  0.350 - 4.500 uIU/mL  TROPONIN I     Status: Abnormal   Collection Time    11/08/13  9:25 PM      Result Value Ref Range   Troponin I 4.86 (*) <0.30 ng/mL   Comment:            Due to the release kinetics of cTnI,     a negative result within the first hours     of the onset of symptoms does not rule out     myocardial infarction with certainty.     If myocardial infarction is still suspected,     repeat the test at appropriate intervals.     CRITICAL VALUE NOTED.  VALUE IS CONSISTENT WITH PREVIOUSLY REPORTED AND CALLED VALUE.  MAGNESIUM     Status: None   Collection Time    11/08/13  9:25 PM      Result Value Ref Range   Magnesium 1.9  1.5 - 2.5 mg/dL  POTASSIUM     Status: None   Collection Time    11/08/13  9:25 PM      Result Value Ref Range   Potassium 3.9  3.7 - 5.3 mEq/L  GLUCOSE, CAPILLARY     Status: Abnormal     Collection Time    11/08/13  9:55 PM      Result Value Ref Range   Glucose-Capillary 136 (*) 70 - 99 mg/dL  TROPONIN I     Status: Abnormal   Collection Time    11/09/13  3:10 AM      Result Value Ref Range   Troponin I 3.78 (*) <0.30 ng/mL   Comment:            Due to the release kinetics of cTnI,     a negative result within the first hours     of the onset of symptoms does not rule out     myocardial infarction with certainty.     If myocardial infarction is still suspected,     repeat the test at appropriate intervals.     CRITICAL VALUE NOTED.  VALUE IS CONSISTENT WITH PREVIOUSLY REPORTED AND CALLED VALUE.  CBC     Status: Abnormal   Collection Time    11/09/13  3:10 AM      Result Value Ref Range   WBC 8.5  4.0 - 10.5 K/uL   RBC 2.80 (*) 3.87 - 5.11 MIL/uL   Hemoglobin 8.3 (*) 12.0 - 15.0 g/dL   HCT 26.4 (*) 36.0 - 46.0 %   MCV 94.3  78.0 - 100.0 fL   MCH 29.6  26.0 - 34.0 pg   MCHC 31.4  30.0 - 36.0 g/dL   RDW 13.2  11.5 - 15.5 %   Platelets 227  150 - 400 K/uL  VITAMIN B12     Status: None   Collection Time    11/09/13  3:10 AM      Result Value Ref Range   Vitamin B-12 833  211 - 911 pg/mL  Comment: Performed at [place]     Status: Abnormal   Collection Time    11/09/13  3:10 AM      Result Value Ref Range   Sodium 143  137 - 147 mEq/L   Potassium 3.9  3.7 - 5.3 mEq/L   Chloride 102  96 - 112 mEq/L   CO2 23  19 - 32 mEq/L   Glucose, Bld 162 (*) 70 - 99 mg/dL   BUN 20  6 - 23 mg/dL   Creatinine, Ser 1.09  0.50 - 1.10 mg/dL   Calcium 9.6  8.4 - 10.5 mg/dL   GFR calc non Af Amer 46 (*) >90 mL/min   GFR calc Af Amer 54 (*) >90 mL/min   Comment: (NOTE)     The eGFR has been calculated using the CKD EPI equation.     This calculation has not been validated in all clinical situations.     eGFR's persistently <90 mL/min signify possible Chronic Kidney     Disease.  MAGNESIUM     Status: None   Collection Time     11/09/13  3:10 AM      Result Value Ref Range   Magnesium 2.0  1.5 - 2.5 mg/dL  HEPARIN LEVEL (UNFRACTIONATED)     Status: None   Collection Time    11/09/13  7:40 AM      Result Value Ref Range   Heparin Unfractionated 0.36  0.30 - 0.70 IU/mL   Comment:            IF HEPARIN RESULTS ARE BELOW     EXPECTED VALUES, AND PATIENT     DOSAGE HAS BEEN CONFIRMED,     SUGGEST FOLLOW UP TESTING     OF ANTITHROMBIN III LEVELS.  CBC     Status: Abnormal   Collection Time    11/09/13  7:40 AM      Result Value Ref Range   WBC 7.5  4.0 - 10.5 K/uL   RBC 2.68 (*) 3.87 - 5.11 MIL/uL   Hemoglobin 7.9 (*) 12.0 - 15.0 g/dL   HCT 25.4 (*) 36.0 - 46.0 %   MCV 94.8  78.0 - 100.0 fL   MCH 29.5  26.0 - 34.0 pg   MCHC 31.1  30.0 - 36.0 g/dL   RDW 13.4  11.5 - 15.5 %   Platelets 215  150 - 400 K/uL  GLUCOSE, CAPILLARY     Status: Abnormal   Collection Time    11/09/13  8:06 AM      Result Value Ref Range   Glucose-Capillary 133 (*) 70 - 99 mg/dL  GLUCOSE, CAPILLARY     Status: Abnormal   Collection Time    11/09/13 11:35 AM      Result Value Ref Range   Glucose-Capillary 114 (*) 70 - 99 mg/dL  OCCULT BLOOD X 1 CARD TO LAB, STOOL     Status: Abnormal   Collection Time    11/09/13  1:34 PM      Result Value Ref Range   Fecal Occult Bld POSITIVE (*) NEGATIVE  PREPARE RBC (CROSSMATCH)     Status: None   Collection Time    11/09/13  3:26 PM      Result Value Ref Range   Order Confirmation ORDER PROCESSED BY BLOOD BANK    PROTIME-INR     Status: None   Collection Time    11/09/13  3:50 PM  Result Value Ref Range   Prothrombin Time 14.3  11.6 - 15.2 seconds   INR 1.13  0.00 - 1.49  TYPE AND SCREEN     Status: None   Collection Time    11/09/13  4:20 PM      Result Value Ref Range   ABO/RH(D) O POS     Antibody Screen NEG     Sample Expiration 11/12/2013     Unit Number [id number]     Blood Component Type RBC LR PHER2     Unit division 00     Status of Unit ISSUED,FINAL      Transfusion Status OK TO TRANSFUSE     Crossmatch Result Compatible     Unit Number [id number]     Blood Component Type RBC LR PHER1     Unit division 00     Status of Unit ISSUED,FINAL     Transfusion Status OK TO TRANSFUSE     Crossmatch Result Compatible     Unit Number [id number]     Blood Component Type RBC LR PHER2     Unit division 00     Status of Unit REL FROM [facility]     Transfusion Status OK TO TRANSFUSE     Crossmatch Result Compatible    ABO/RH     Status: None   Collection Time    11/09/13  4:20 PM      Result Value Ref Range   ABO/RH(D) O POS    GLUCOSE, CAPILLARY     Status: Abnormal   Collection Time    11/09/13  4:43 PM      Result Value Ref Range   Glucose-Capillary 102 (*) 70 - 99 mg/dL  GLUCOSE, CAPILLARY     Status: Abnormal   Collection Time    11/09/13  9:34 PM      Result Value Ref Range   Glucose-Capillary 110 (*) 70 - 99 mg/dL  BASIC METABOLIC PANEL     Status: Abnormal   Collection Time    11/10/13  3:10 AM      Result Value Ref Range   Sodium 141  137 - 147 mEq/L   Potassium 4.4  3.7 - 5.3 mEq/L   Chloride 102  96 - 112 mEq/L   CO2 22  19 - 32 mEq/L   Glucose, Bld 137 (*) 70 - 99 mg/dL   BUN 33 (*) 6 - 23 mg/dL   Comment: DELTA CHECK NOTED   Creatinine, Ser 1.38 (*) 0.50 - 1.10 mg/dL   Calcium 9.5  8.4 - 10.5 mg/dL   GFR calc non Af Amer 35 (*) >90 mL/min   GFR calc Af Amer 40 (*) >90 mL/min   Comment: (NOTE)     The eGFR has been calculated using the CKD EPI equation.     This calculation has not been validated in all clinical situations.     eGFR's persistently <90 mL/min signify possible Chronic Kidney     Disease.  HEPARIN LEVEL (UNFRACTIONATED)     Status: None   Collection Time    11/10/13  3:10 AM      Result Value Ref Range   Heparin Unfractionated 0.33  0.30 - 0.70 IU/mL   Comment:            IF HEPARIN RESULTS ARE BELOW     EXPECTED VALUES, AND PATIENT     DOSAGE HAS BEEN CONFIRMED,     SUGGEST FOLLOW UP TESTING      OF  ANTITHROMBIN III LEVELS.  CBC     Status: Abnormal   Collection Time    11/10/13  3:10 AM      Result Value Ref Range   WBC 8.1  4.0 - 10.5 K/uL   RBC 3.75 (*) 3.87 - 5.11 MIL/uL   Hemoglobin 10.8 (*) 12.0 - 15.0 g/dL   Comment: POST TRANSFUSION SPECIMEN   HCT 33.7 (*) 36.0 - 46.0 %   MCV 89.9  78.0 - 100.0 fL   MCH 28.8  26.0 - 34.0 pg   MCHC 32.0  30.0 - 36.0 g/dL   RDW 16.1 (*) 11.5 - 15.5 %   Platelets 205  150 - 400 K/uL  GLUCOSE, CAPILLARY     Status: Abnormal   Collection Time    11/10/13  8:06 AM      Result Value Ref Range   Glucose-Capillary 143 (*) 70 - 99 mg/dL  CBC     Status: Abnormal   Collection Time    11/10/13 10:55 AM      Result Value Ref Range   WBC 6.8  4.0 - 10.5 K/uL   RBC 3.69 (*) 3.87 - 5.11 MIL/uL   Hemoglobin 10.6 (*) 12.0 - 15.0 g/dL   HCT 32.9 (*) 36.0 - 46.0 %   MCV 89.2  78.0 - 100.0 fL   MCH 28.7  26.0 - 34.0 pg   MCHC 32.2  30.0 - 36.0 g/dL   RDW 16.1 (*) 11.5 - 15.5 %   Platelets 207  150 - 400 K/uL    Imaging: Imaging results have been reviewed  Assessment/Plan:   1. Active Problems: 2.   DIABETES MELLITUS, TYPE II, UNCONTROLLED 3.   HYPERLIPIDEMIA 4.   HYPERTENSION 5.   Chest pain 6.   NSTEMI (non-ST elevated myocardial infarction) 7.   Iron deficiency anemia 8.   Acute heart failure 9. LV dysfunction, severe MR  Time Spent Directly with Patient:  30 minutes  Length of Stay:  LOS: 2 days   Admitted with NSTEMI/APE requiring BiPAP, IV diuresis. 2D shows EF 30% with severe MR. I/O neg 4.5 liters. Feeling much better. Denies CP/SOB. She was guiac + with declining HGB on IV hep s/p Tx 2 u PRBCs with resulting appropriate increase Hgb.For R/L heart cath today.  Lorretta Harp 11/10/2013, 11:31 AM

## 2013-11-10 NOTE — Consult Note (Signed)
Reason for Consult:Left main and 3 vessel CAD, Mitral regurgitation Referring Physician: Dr. Katheren Puller is an 80 y.o. female.  HPI: 80 yo woman with a remote history of CAD with an MI in 47. She lives alone but has been active. She has a history significant for type II diabetes, hypertension, dyslipidemia, and AVMs of the GI tract.  She says that she has been having pain for the last 4-5 months. She describes this as a sharp, burning pain that starts in her left axilla and radiates to the left elbow. It then radiates across her chest and to the right arm as well. This usually occurs after she has been working in her garden. It is relieved by rest. About 4-5 days PTA she began having exertional shortness of breath. It rapidly progressed to where she could only take a few steps. She noticed orthopnea and peripheral edema as well. On the day of admission she had worsening SOB as well a prolonged episode of pain.  She was admitted and ruled in for a NQWMI. She was in CHF with pulmonary edema. She had an echo which showed an EF of 30-35% with severe MR. This evening she had a cardiac catheterization showing severe left main and 3 vessel CAD withan EF of 30% and MR.  She is currently pain free and denies SOB.  Past Medical History  Diagnosis Date  . Hyperlipidemia   . Hypertension   . Hemorrhoids   . AVM (arteriovenous malformation)   . Diabetes mellitus, type 2   . Anxiety   . PVD (peripheral vascular disease)   . Osteoporosis   . Vitamin B12 deficiency   . Cataract     Past Surgical History  Procedure Laterality Date  . Appendectomy      1945  . Abdominal hysterectomy      1969  . Hemorrhoid surgery      1959    Family History  Problem Relation Age of Onset  . Cancer Mother     cervical   . Cancer Brother     renal ...x3??  [name] Hypertension Other   . Stroke Other     Social History:  reports that she quit smoking about 29 years ago. Her smoking use included  Cigarettes. She has a 36 pack-year smoking history. She has never used smokeless tobacco. She reports that she does not drink alcohol or use illicit drugs.  Allergies:  Allergies  Allergen Reactions  . Meperidine Hcl   . Sulfonamide Derivatives     Medications:  Scheduled: . aspirin EC  81 mg Oral Daily  . carvedilol  3.125 mg Oral BID WC  . furosemide  40 mg Intravenous BID  . insulin aspart  0-9 Units Subcutaneous TID WC  . lisinopril  5 mg Oral Daily  . pantoprazole  40 mg Oral Daily  . potassium chloride  40 mEq Oral Daily  . simvastatin  40 mg Oral Daily    Results for orders placed during the hospital encounter of 11/08/13 (from the past 48 hour(s))  TSH     Status: None   Collection Time    11/08/13  9:25 PM      Result Value Ref Range   TSH 1.310  0.350 - 4.500 uIU/mL  TROPONIN I     Status: Abnormal   Collection Time    11/08/13  9:25 PM      Result Value Ref Range   Troponin I 4.86 (*) <0.30 ng/mL  Comment:            Due to the release kinetics of cTnI,     a negative result within the first hours     of the onset of symptoms does not rule out     myocardial infarction with certainty.     If myocardial infarction is still suspected,     repeat the test at appropriate intervals.     CRITICAL VALUE NOTED.  VALUE IS CONSISTENT WITH PREVIOUSLY REPORTED AND CALLED VALUE.  MAGNESIUM     Status: None   Collection Time    11/08/13  9:25 PM      Result Value Ref Range   Magnesium 1.9  1.5 - 2.5 mg/dL  POTASSIUM     Status: None   Collection Time    11/08/13  9:25 PM      Result Value Ref Range   Potassium 3.9  3.7 - 5.3 mEq/L  GLUCOSE, CAPILLARY     Status: Abnormal   Collection Time    11/08/13  9:55 PM      Result Value Ref Range   Glucose-Capillary 136 (*) 70 - 99 mg/dL  TROPONIN I     Status: Abnormal   Collection Time    11/09/13  3:10 AM      Result Value Ref Range   Troponin I 3.78 (*) <0.30 ng/mL   Comment:            Due to the release kinetics  of cTnI,     a negative result within the first hours     of the onset of symptoms does not rule out     myocardial infarction with certainty.     If myocardial infarction is still suspected,     repeat the test at appropriate intervals.     CRITICAL VALUE NOTED.  VALUE IS CONSISTENT WITH PREVIOUSLY REPORTED AND CALLED VALUE.  CBC     Status: Abnormal   Collection Time    11/09/13  3:10 AM      Result Value Ref Range   WBC 8.5  4.0 - 10.5 K/uL   RBC 2.80 (*) 3.87 - 5.11 MIL/uL   Hemoglobin 8.3 (*) 12.0 - 15.0 g/dL   HCT 26.4 (*) 36.0 - 46.0 %   MCV 94.3  78.0 - 100.0 fL   MCH 29.6  26.0 - 34.0 pg   MCHC 31.4  30.0 - 36.0 g/dL   RDW 13.2  11.5 - 15.5 %   Platelets 227  150 - 400 K/uL  VITAMIN B12     Status: None   Collection Time    11/09/13  3:10 AM      Result Value Ref Range   Vitamin B-12 833  211 - 911 pg/mL   Comment: Performed at [place]     Status: Abnormal   Collection Time    11/09/13  3:10 AM      Result Value Ref Range   Sodium 143  137 - 147 mEq/L   Potassium 3.9  3.7 - 5.3 mEq/L   Chloride 102  96 - 112 mEq/L   CO2 23  19 - 32 mEq/L   Glucose, Bld 162 (*) 70 - 99 mg/dL   BUN 20  6 - 23 mg/dL   Creatinine, Ser 1.09  0.50 - 1.10 mg/dL   Calcium 9.6  8.4 - 10.5 mg/dL   GFR calc non Af Amer 46 (*) >90 mL/min  GFR calc Af Amer 54 (*) >90 mL/min   Comment: (NOTE)     The eGFR has been calculated using the CKD EPI equation.     This calculation has not been validated in all clinical situations.     eGFR's persistently <90 mL/min signify possible Chronic Kidney     Disease.  MAGNESIUM     Status: None   Collection Time    11/09/13  3:10 AM      Result Value Ref Range   Magnesium 2.0  1.5 - 2.5 mg/dL  HEPARIN LEVEL (UNFRACTIONATED)     Status: None   Collection Time    11/09/13  7:40 AM      Result Value Ref Range   Heparin Unfractionated 0.36  0.30 - 0.70 IU/mL   Comment:            IF HEPARIN RESULTS ARE BELOW      EXPECTED VALUES, AND PATIENT     DOSAGE HAS BEEN CONFIRMED,     SUGGEST FOLLOW UP TESTING     OF ANTITHROMBIN III LEVELS.  CBC     Status: Abnormal   Collection Time    11/09/13  7:40 AM      Result Value Ref Range   WBC 7.5  4.0 - 10.5 K/uL   RBC 2.68 (*) 3.87 - 5.11 MIL/uL   Hemoglobin 7.9 (*) 12.0 - 15.0 g/dL   HCT 25.4 (*) 36.0 - 46.0 %   MCV 94.8  78.0 - 100.0 fL   MCH 29.5  26.0 - 34.0 pg   MCHC 31.1  30.0 - 36.0 g/dL   RDW 13.4  11.5 - 15.5 %   Platelets 215  150 - 400 K/uL  GLUCOSE, CAPILLARY     Status: Abnormal   Collection Time    11/09/13  8:06 AM      Result Value Ref Range   Glucose-Capillary 133 (*) 70 - 99 mg/dL  GLUCOSE, CAPILLARY     Status: Abnormal   Collection Time    11/09/13 11:35 AM      Result Value Ref Range   Glucose-Capillary 114 (*) 70 - 99 mg/dL  OCCULT BLOOD X 1 CARD TO LAB, STOOL     Status: Abnormal   Collection Time    11/09/13  1:34 PM      Result Value Ref Range   Fecal Occult Bld POSITIVE (*) NEGATIVE  PREPARE RBC (CROSSMATCH)     Status: None   Collection Time    11/09/13  3:26 PM      Result Value Ref Range   Order Confirmation ORDER PROCESSED BY BLOOD BANK    PROTIME-INR     Status: None   Collection Time    11/09/13  3:50 PM      Result Value Ref Range   Prothrombin Time 14.3  11.6 - 15.2 seconds   INR 1.13  0.00 - 1.49  TYPE AND SCREEN     Status: None   Collection Time    11/09/13  4:20 PM      Result Value Ref Range   ABO/RH(D) O POS     Antibody Screen NEG     Sample Expiration 11/12/2013     Unit Number [id number]     Blood Component Type RBC LR PHER2     Unit division 00     Status of Unit ISSUED,FINAL     Transfusion Status OK TO TRANSFUSE     Crossmatch Result Compatible  Unit Number [id number]     Blood Component Type RBC LR PHER1     Unit division 00     Status of Unit ISSUED,FINAL     Transfusion Status OK TO TRANSFUSE     Crossmatch Result Compatible     Unit Number [id number]     Blood  Component Type RBC LR PHER2     Unit division 00     Status of Unit REL FROM [facility]     Transfusion Status OK TO TRANSFUSE     Crossmatch Result Compatible    ABO/RH     Status: None   Collection Time    11/09/13  4:20 PM      Result Value Ref Range   ABO/RH(D) O POS    GLUCOSE, CAPILLARY     Status: Abnormal   Collection Time    11/09/13  4:43 PM      Result Value Ref Range   Glucose-Capillary 102 (*) 70 - 99 mg/dL  GLUCOSE, CAPILLARY     Status: Abnormal   Collection Time    11/09/13  9:34 PM      Result Value Ref Range   Glucose-Capillary 110 (*) 70 - 99 mg/dL  BASIC METABOLIC PANEL     Status: Abnormal   Collection Time    11/10/13  3:10 AM      Result Value Ref Range   Sodium 141  137 - 147 mEq/L   Potassium 4.4  3.7 - 5.3 mEq/L   Chloride 102  96 - 112 mEq/L   CO2 22  19 - 32 mEq/L   Glucose, Bld 137 (*) 70 - 99 mg/dL   BUN 33 (*) 6 - 23 mg/dL   Comment: DELTA CHECK NOTED   Creatinine, Ser 1.38 (*) 0.50 - 1.10 mg/dL   Calcium 9.5  8.4 - 10.5 mg/dL   GFR calc non Af Amer 35 (*) >90 mL/min   GFR calc Af Amer 40 (*) >90 mL/min   Comment: (NOTE)     The eGFR has been calculated using the CKD EPI equation.     This calculation has not been validated in all clinical situations.     eGFR's persistently <90 mL/min signify possible Chronic Kidney     Disease.  HEPARIN LEVEL (UNFRACTIONATED)     Status: None   Collection Time    11/10/13  3:10 AM      Result Value Ref Range   Heparin Unfractionated 0.33  0.30 - 0.70 IU/mL   Comment:            IF HEPARIN RESULTS ARE BELOW     EXPECTED VALUES, AND PATIENT     DOSAGE HAS BEEN CONFIRMED,     SUGGEST FOLLOW UP TESTING     OF ANTITHROMBIN III LEVELS.  CBC     Status: Abnormal   Collection Time    11/10/13  3:10 AM      Result Value Ref Range   WBC 8.1  4.0 - 10.5 K/uL   RBC 3.75 (*) 3.87 - 5.11 MIL/uL   Hemoglobin 10.8 (*) 12.0 - 15.0 g/dL   Comment: POST TRANSFUSION SPECIMEN   HCT 33.7 (*) 36.0 - 46.0 %   MCV 89.9   78.0 - 100.0 fL   MCH 28.8  26.0 - 34.0 pg   MCHC 32.0  30.0 - 36.0 g/dL   RDW 16.1 (*) 11.5 - 15.5 %   Platelets 205  150 - 400 K/uL  GLUCOSE, CAPILLARY  Status: Abnormal   Collection Time    11/10/13  8:06 AM      Result Value Ref Range   Glucose-Capillary 143 (*) 70 - 99 mg/dL  CBC     Status: Abnormal   Collection Time    11/10/13 10:55 AM      Result Value Ref Range   WBC 6.8  4.0 - 10.5 K/uL   RBC 3.69 (*) 3.87 - 5.11 MIL/uL   Hemoglobin 10.6 (*) 12.0 - 15.0 g/dL   HCT 32.9 (*) 36.0 - 46.0 %   MCV 89.2  78.0 - 100.0 fL   MCH 28.7  26.0 - 34.0 pg   MCHC 32.2  30.0 - 36.0 g/dL   RDW 16.1 (*) 11.5 - 15.5 %   Platelets 207  150 - 400 K/uL  GLUCOSE, CAPILLARY     Status: Abnormal   Collection Time    11/10/13 12:18 PM      Result Value Ref Range   Glucose-Capillary 121 (*) 70 - 99 mg/dL  POCT I-STAT 3, ART BLOOD GAS (G3+)     Status: Abnormal   Collection Time    11/10/13  5:26 PM      Result Value Ref Range   pH, Arterial 7.418  7.350 - 7.450   pCO2 arterial 35.2  35.0 - 45.0 mmHg   pO2, Arterial 64.0 (*) 80.0 - 100.0 mmHg   Bicarbonate 22.7  20.0 - 24.0 mEq/L   TCO2 24  0 - 100 mmol/L   O2 Saturation 93.0     Acid-base deficit 1.0  0.0 - 2.0 mmol/L   Sample type ARTERIAL    POCT I-STAT 3, VENOUS BLOOD GAS (G3P V)     Status: Abnormal   Collection Time    11/10/13  5:44 PM      Result Value Ref Range   pH, Ven 7.393 (*) 7.250 - 7.300   pCO2, Ven 38.0 (*) 45.0 - 50.0 mmHg   pO2, Ven 35.0  30.0 - 45.0 mmHg   Bicarbonate 23.1  20.0 - 24.0 mEq/L   TCO2 24  0 - 100 mmol/L   O2 Saturation 66.0     Acid-base deficit 2.0  0.0 - 2.0 mmol/L   Sample type VENOUS     Comment NOTIFIED PHYSICIAN    POCT ACTIVATED CLOTTING TIME     Status: None   Collection Time    11/10/13  6:00 PM      Result Value Ref Range   Activated Clotting Time 105      No results found.  Review of Systems  Constitutional: Positive for malaise/fatigue. Negative for fever and chills.   HENT: Positive for hearing loss.   Respiratory: Positive for cough and shortness of breath.   Cardiovascular: Positive for chest pain, orthopnea and leg swelling.  Gastrointestinal: Positive for blood in stool. Negative for nausea, vomiting and melena.  Musculoskeletal: Positive for back pain.  Neurological: Negative for dizziness.  All other systems reviewed and are negative.  Blood pressure 128/69, pulse 96, temperature 98.2 F (36.8 C), temperature source Oral, resp. rate 20, height 5' (1.524 m), weight 126 lb (57.153 kg), SpO2 96.00%. Physical Exam  Vitals reviewed. Constitutional: She is oriented to person, place, and time. No distress.  elderly  HENT:  Head: Normocephalic and atraumatic.  Poor dentition, only 4 teeth left  Eyes: EOM are normal. Pupils are equal, round, and reactive to light.  Neck: Neck supple. No thyromegaly present.  No carotid bruits  Cardiovascular: Normal  rate and regular rhythm.   Murmur heard. 2+ PT on left, faint on R  Respiratory: Effort normal. She has no rales (bibasilar).  GI: Soft. There is no tenderness.  Musculoskeletal: She exhibits edema (trace).  Lymphadenopathy:    She has no cervical adenopathy.  Neurological: She is alert and oriented to person, place, and time. No cranial nerve deficit.  Limited exam, in bed after cath, no apparent motor deficit  Skin: Skin is warm and dry.   CARDIAC CATHETERIZATION HEMODYNAMICS:  RA: A wave 12 V wave 9, mean 8  RV: 35/10  PA: 35/16  PC: A wave 16 V wave 26, mean 17  LV: 98/18  AO: 98/45  Oxygen saturation in the aorta 93% and the pulmonary artery 66 %  Cardiac output: 2.7 l/min (Thermo); 5.2 (Fick)  Cardiac index: 1.7 l/m/m2 3.4  ANGIOGRAPHY:  Fluoroscopy revealed severe coronary calcification  Left main: Calcified vessel with 90% eccentric distal stenosis prior to bifurcating into the LAD and left circumflex vessel.  LAD: 30% proximal stenosis with mild 20% mid LAD narrowing after diagonal  vessel.  Left circumflex: 99% ostial stenosis followed by 80% eccentric calcified proximal stenosis before the first marginal branch.  Right coronary artery: 99% mid subtotal occlusion with evidence for bridging collaterals. Regarding marginal branch. There are also faint left to right collaterals to the distal RCA  Left ventriculography dilated left ventricle with an ejection fraction of 30 to less than 35%. There is at least 3-4+ angiographic mitral regurgitation.  Selective angiography in the right iliac artery suggested total proximal iliac occlusion. Distal aortography was performed, which revealed probable 50% right renal artery stenosis. There was total occlusion of the right iliac at its origin from the aorta. There was distal recanalization. There was 70% ostial left iliac stenosis  IMPRESSION:  Moderately severe ischemic cardiomyopathy with an ejection fraction of approximately 30% with also 3-4+ angiographic mitral regurgitation.  Severe coronary calcification with 90% distal left main stenosis, 30% proximal LAD stenosis, 99% ostial left circumflex stenosis, followed by 80% calcified eccentric proximal circumflex stenosis and subtotal 99% mid RCA 6 stenosis with evidence for right to right bridging collaterals as well as faint left to right distal collaterals.  Significant peripheral vascular disease with probable 50% right renal stenosis and total occlusion of the right common iliac artery at its origin from the aorta with 70% ostial left common iliac artery stenosis and evidence for distal recanalization of the right iliofemoral region.  Mild pulmonary hypertension with PA systolic pressure 35 mm.     ECHOCARDIOGRAM Study Conclusions  - Left ventricle: Systolic function was moderately to severely reduced. The estimated ejection fraction was in the range of 30% to 35%. Diffuse hypokinesis. - Mitral valve: There was severe regurgitation. - Left atrium: The atrium was mildly dilated. -  Pulmonary arteries: PA peak pressure: 48 mm Hg (S).   Assessment/Plan: 80 yo woman with multiple CRF who presents with an OOH MI complicated by acute systolic heart failure. She has mt least moderate and probably severe MR. She has critical left main disease and a totally occluded RCA. She does have reasonable target vessels.  CABG is indicated for survival benefit and relief of symptoms. In all likelihood she will need a mitral repair as well.  I discussed the general nature of the procedure, the need for general anesthesia, and the incisions to be used with the patient and her family. We discussed the expected hospital stay, overall recovery and short and long term outcomes.  I reviewed the indications, risks, benefits and alternatives. They understand the risks include, but are not limited to death, stroke, MI, DVT/PE, bleeding, possible need for transfusion, infections, and other organ system dysfunction including respiratory, renal, or GI complications.   She accepts the risks and agrees to proceed.  We will tentatively plan to proceed with CABG, possible mitral repair tomorrow- late morning Her creatinine was elevated this AM at 1.38. We will recheck her creatinine in the morning- if stable or improved we will proceed, if renal function is worsening we will have to delay surgery   HENDRICKSON,STEVEN C 11/10/2013, 8:08 PM

## 2013-11-10 NOTE — Progress Notes (Signed)
ANTICOAGULATION CONSULT NOTE - Follow Up Consult  Pharmacy Consult for Heparin > resume 4 hrs after sheath out Indication: CAD - awaiting CABG eval  Allergies  Allergen Reactions  . Meperidine Hcl   . Sulfonamide Derivatives     Patient Measurements: Height: 5' (152.4 cm) Weight: 126 lb (57.153 kg) IBW/kg (Calculated) : 45.5  Vital Signs: Temp: 98.6 F (37 C) (06/17 1214) Temp src: Oral (06/17 1214) BP: 110/28 mmHg (06/17 1900) Pulse Rate: 92 (06/17 1646)  Labs:  Recent Labs  11/08/13 1030 11/08/13 1554 11/08/13 2125 11/09/13 0310 11/09/13 0740 11/09/13 1550 11/10/13 0310 11/10/13 1055  HGB 8.7*  --   --  8.3* 7.9*  --  10.8* 10.6*  HCT 28.4*  --   --  26.4* 25.4*  --  33.7* 32.9*  PLT 252  --   --  227 215  --  205 207  LABPROT  --   --   --   --   --  14.3  --   --   INR  --   --   --   --   --  1.13  --   --   HEPARINUNFRC  --   --   --   --  0.36  --  0.33  --   CREATININE 0.96  --   --  1.09  --   --  1.38*  --   TROPONINI 1.08* 3.53* 4.86* 3.78*  --   --   --   --     Estimated Creatinine Clearance: 25.3 ml/min (by C-G formula based on Cr of 1.38).   Medical History: Past Medical History  Diagnosis Date  . Hyperlipidemia   . Hypertension   . Hemorrhoids   . AVM (arteriovenous malformation)   . Diabetes mellitus, type 2   . Anxiety   . PVD (peripheral vascular disease)   . Osteoporosis   . Vitamin B12 deficiency   . Cataract     Medications:  Prescriptions prior to admission  Medication Sig Dispense Refill  . aspirin 81 MG tablet Take 81 mg by mouth daily.        . Calcium Carbonate-Vitamin D (CALCIUM-VITAMIN D) 600-200 MG-UNIT CAPS Take by mouth 2 (two) times daily.        [name]. Casanthranol-Docusate Sodium 30-100 MG CAPS Take 1 capsule by mouth daily as needed. As needed for constipation      . ibuprofen (ADVIL,MOTRIN) 400 MG tablet Take 400 mg by mouth every 6 (six) hours as needed for mild pain.       . Lancets MISC by Does not apply route.         [name]. lisinopril (PRINIVIL,ZESTRIL) 20 MG tablet Take 20 mg by mouth daily.      . metFORMIN (GLUCOPHAGE XR) 500 MG 24 hr tablet 1 tablet (500 mg) by mouth every night.  Recheck labs in 3 months (11/2013).  30 tablet  2  . Multiple Vitamin (MULTIVITAMIN) tablet Take 1 tablet by mouth daily.        . simvastatin (ZOCOR) 40 MG tablet Take 40 mg by mouth daily.      . vitamin B-12 (CYANOCOBALAMIN) 1000 MCG tablet Take 1,000 mcg by mouth daily.        [name]. glucose blood (ONE TOUCH ULTRA TEST) test strip TEST TWICE DAILY Dx 250.02  100 each  9    Assessment: 80 y.o. female with chest pain, elevated cardiac markers, s/p cath today.  Cath revealed severe CAD  and significant mitral regurgitation.  Now awaiting CABG consultation.  Pharmacy asked to resume IV heparin 4 hrs after sheath pull (removed at 1804 PM).  Heparin level was therapeutic this AM on 700 units/hr.  Goal of Therapy:  Heparin level 0.3-0.7 units/ml Monitor platelets by anticoagulation protocol: Yes   Plan:  1. At 2200 will resume heparin at a rate of 700 units/hr. 2. Check heparin level with AM labs. 3. Daily heparin level and CBC.  Tad MooreJessica Carney, Pharm D, BCPS  Clinical Pharmacist Pager [phone]  11/10/2013 7:35 PM

## 2013-11-10 NOTE — Progress Notes (Signed)
ANTICOAGULATION CONSULT NOTE - Follow Up Consult  Pharmacy Consult for Heparin Indication: chest pain/ACS  Allergies  Allergen Reactions  . Meperidine Hcl   . Sulfonamide Derivatives     Patient Measurements: Height: 5' (152.4 cm) Weight: 126 lb (57.153 kg) IBW/kg (Calculated) : 45.5  Vital Signs: Temp: 98.4 F (36.9 C) (06/17 0802) Temp src: Oral (06/17 0802) BP: 112/67 mmHg (06/17 0802) Pulse Rate: 81 (06/17 0802)  Labs:  Recent Labs  11/08/13 1030 11/08/13 1554 11/08/13 2125 11/09/13 0310 11/09/13 0740 11/09/13 1550 11/10/13 0310  HGB 8.7*  --   --  8.3* 7.9*  --  10.8*  HCT 28.4*  --   --  26.4* 25.4*  --  33.7*  PLT 252  --   --  227 215  --  205  LABPROT  --   --   --   --   --  14.3  --   INR  --   --   --   --   --  1.13  --   HEPARINUNFRC  --   --   --   --  0.36  --  0.33  CREATININE 0.96  --   --  1.09  --   --  1.38*  TROPONINI 1.08* 3.53* 4.86* 3.78*  --   --   --     Estimated Creatinine Clearance: 25.3 ml/min (by C-G formula based on Cr of 1.38).   Medical History: Past Medical History  Diagnosis Date  . Hyperlipidemia   . Hypertension   . Hemorrhoids   . AVM (arteriovenous malformation)   . Diabetes mellitus, type 2   . Anxiety   . PVD (peripheral vascular disease)   . Osteoporosis   . Vitamin B12 deficiency   . Cataract     Medications:  Prescriptions prior to admission  Medication Sig Dispense Refill  . aspirin 81 MG tablet Take 81 mg by mouth daily.        . Calcium Carbonate-Vitamin D (CALCIUM-VITAMIN D) 600-200 MG-UNIT CAPS Take by mouth 2 (two) times daily.        [name]. Casanthranol-Docusate Sodium 30-100 MG CAPS Take 1 capsule by mouth daily as needed. As needed for constipation      . ibuprofen (ADVIL,MOTRIN) 400 MG tablet Take 400 mg by mouth every 6 (six) hours as needed for mild pain.       . Lancets MISC by Does not apply route.        [name]. lisinopril (PRINIVIL,ZESTRIL) 20 MG tablet Take 20 mg by mouth daily.      . metFORMIN  (GLUCOPHAGE XR) 500 MG 24 hr tablet 1 tablet (500 mg) by mouth every night.  Recheck labs in 3 months (11/2013).  30 tablet  2  . Multiple Vitamin (MULTIVITAMIN) tablet Take 1 tablet by mouth daily.        . simvastatin (ZOCOR) 40 MG tablet Take 40 mg by mouth daily.      . vitamin B-12 (CYANOCOBALAMIN) 1000 MCG tablet Take 1,000 mcg by mouth daily.        [name]. glucose blood (ONE TOUCH ULTRA TEST) test strip TEST TWICE DAILY Dx 250.02  100 each  9    Assessment: 80 y.o. female with chest pain, elevated cardiac markers, plan for cath today? Heparin is therapeutic.      Goal of Therapy:  Heparin level 0.3-0.7 units/ml Monitor platelets by anticoagulation protocol: Yes   Plan:   Heparin 700 uts/hr  Daily CBC, HL  F/u after cath

## 2013-11-10 NOTE — H&P (View-Only) (Signed)
Subjective:  No CP/SOB s/p Tx 2u PRBCs  Objective:  Temp:  [98.1 F (36.7 C)-98.7 F (37.1 C)] 98.4 F (36.9 C) (06/17 0802) Pulse Rate:  [80-99] 81 (06/17 0802) Resp:  [16-35] 22 (06/17 0802) BP: (82-223)/(39-181) 112/67 mmHg (06/17 0802) SpO2:  [92 %-98 %] 92 % (06/17 0802) Weight:  [126 lb (57.153 kg)-127 lb 4.8 oz (57.743 kg)] 126 lb (57.153 kg) (06/17 0500) Weight change: -2 lb (-0.907 kg)  Intake/Output from previous day: 06/16 0701 - 06/17 0700 In: 845.2 [I.V.:175; Blood:670.2] Out: 3301 [Urine:3300; Stool:1]  Intake/Output from this shift:    Physical Exam: General appearance: alert and no distress Neck: no adenopathy, no carotid bruit, no JVD, supple, symmetrical, trachea midline and thyroid not enlarged, symmetric, no tenderness/mass/nodules Lungs: clear to auscultation bilaterally Heart: regular rate and rhythm, S1, S2 normal, no murmur, click, rub or gallop Extremities: extremities normal, atraumatic, no cyanosis or edema  Lab Results: Results for orders placed during the hospital encounter of 11/08/13 (from the past 48 hour(s))  IRON AND TIBC     Status: Abnormal   Collection Time    11/08/13  3:54 PM      Result Value Ref Range   Iron 23 (*) 42 - 135 ug/dL   TIBC 424  250 - 470 ug/dL   Saturation Ratios 5 (*) 20 - 55 %   UIBC 401 (*) 125 - 400 ug/dL   Comment: Performed at [place]     Status: None   Collection Time    11/08/13  3:54 PM      Result Value Ref Range   Ferritin 11  10 - 291 ng/mL   Comment: Performed at [facility]  TROPONIN I     Status: Abnormal   Collection Time    11/08/13  3:54 PM      Result Value Ref Range   Troponin I 3.53 (*) <0.30 ng/mL   Comment:            Due to the release kinetics of cTnI,     a negative result within the first hours     of the onset of symptoms does not rule out     myocardial infarction with certainty.     If myocardial infarction is still suspected,   repeat the test at appropriate intervals.     CRITICAL VALUE NOTED.  VALUE IS CONSISTENT WITH PREVIOUSLY REPORTED AND CALLED VALUE.  VITAMIN B12     Status: Abnormal   Collection Time    11/08/13  3:54 PM      Result Value Ref Range   Vitamin B-12 979 (*) 211 - 911 pg/mL   Comment: Performed at [facility]  CBG MONITORING, ED     Status: Abnormal   Collection Time    11/08/13  6:00 PM      Result Value Ref Range   Glucose-Capillary 161 (*) 70 - 99 mg/dL   Comment 1 Documented in Chart     Comment 2 Notify RN    MRSA PCR SCREENING     Status: None   Collection Time    11/08/13  7:10 PM      Result Value Ref Range   MRSA by PCR NEGATIVE  NEGATIVE   Comment:            The GeneXpert MRSA Assay (FDA     approved for NASAL specimens     only), is one component of  a     comprehensive MRSA colonization     surveillance program. It is not     intended to diagnose MRSA     infection nor to guide or     monitor treatment for     MRSA infections.  TSH     Status: None   Collection Time    11/08/13  9:25 PM      Result Value Ref Range   TSH 1.310  0.350 - 4.500 uIU/mL  TROPONIN I     Status: Abnormal   Collection Time    11/08/13  9:25 PM      Result Value Ref Range   Troponin I 4.86 (*) <0.30 ng/mL   Comment:            Due to the release kinetics of cTnI,     a negative result within the first hours     of the onset of symptoms does not rule out     myocardial infarction with certainty.     If myocardial infarction is still suspected,     repeat the test at appropriate intervals.     CRITICAL VALUE NOTED.  VALUE IS CONSISTENT WITH PREVIOUSLY REPORTED AND CALLED VALUE.  MAGNESIUM     Status: None   Collection Time    11/08/13  9:25 PM      Result Value Ref Range   Magnesium 1.9  1.5 - 2.5 mg/dL  POTASSIUM     Status: None   Collection Time    11/08/13  9:25 PM      Result Value Ref Range   Potassium 3.9  3.7 - 5.3 mEq/L  GLUCOSE, CAPILLARY     Status: Abnormal     Collection Time    11/08/13  9:55 PM      Result Value Ref Range   Glucose-Capillary 136 (*) 70 - 99 mg/dL  TROPONIN I     Status: Abnormal   Collection Time    11/09/13  3:10 AM      Result Value Ref Range   Troponin I 3.78 (*) <0.30 ng/mL   Comment:            Due to the release kinetics of cTnI,     a negative result within the first hours     of the onset of symptoms does not rule out     myocardial infarction with certainty.     If myocardial infarction is still suspected,     repeat the test at appropriate intervals.     CRITICAL VALUE NOTED.  VALUE IS CONSISTENT WITH PREVIOUSLY REPORTED AND CALLED VALUE.  CBC     Status: Abnormal   Collection Time    11/09/13  3:10 AM      Result Value Ref Range   WBC 8.5  4.0 - 10.5 K/uL   RBC 2.80 (*) 3.87 - 5.11 MIL/uL   Hemoglobin 8.3 (*) 12.0 - 15.0 g/dL   HCT 26.4 (*) 36.0 - 46.0 %   MCV 94.3  78.0 - 100.0 fL   MCH 29.6  26.0 - 34.0 pg   MCHC 31.4  30.0 - 36.0 g/dL   RDW 13.2  11.5 - 15.5 %   Platelets 227  150 - 400 K/uL  VITAMIN B12     Status: None   Collection Time    11/09/13  3:10 AM      Result Value Ref Range   Vitamin B-12 833  211 - 911 pg/mL  Comment: Performed at [place]     Status: Abnormal   Collection Time    11/09/13  3:10 AM      Result Value Ref Range   Sodium 143  137 - 147 mEq/L   Potassium 3.9  3.7 - 5.3 mEq/L   Chloride 102  96 - 112 mEq/L   CO2 23  19 - 32 mEq/L   Glucose, Bld 162 (*) 70 - 99 mg/dL   BUN 20  6 - 23 mg/dL   Creatinine, Ser 1.09  0.50 - 1.10 mg/dL   Calcium 9.6  8.4 - 10.5 mg/dL   GFR calc non Af Amer 46 (*) >90 mL/min   GFR calc Af Amer 54 (*) >90 mL/min   Comment: (NOTE)     The eGFR has been calculated using the CKD EPI equation.     This calculation has not been validated in all clinical situations.     eGFR's persistently <90 mL/min signify possible Chronic Kidney     Disease.  MAGNESIUM     Status: None   Collection Time     11/09/13  3:10 AM      Result Value Ref Range   Magnesium 2.0  1.5 - 2.5 mg/dL  HEPARIN LEVEL (UNFRACTIONATED)     Status: None   Collection Time    11/09/13  7:40 AM      Result Value Ref Range   Heparin Unfractionated 0.36  0.30 - 0.70 IU/mL   Comment:            IF HEPARIN RESULTS ARE BELOW     EXPECTED VALUES, AND PATIENT     DOSAGE HAS BEEN CONFIRMED,     SUGGEST FOLLOW UP TESTING     OF ANTITHROMBIN III LEVELS.  CBC     Status: Abnormal   Collection Time    11/09/13  7:40 AM      Result Value Ref Range   WBC 7.5  4.0 - 10.5 K/uL   RBC 2.68 (*) 3.87 - 5.11 MIL/uL   Hemoglobin 7.9 (*) 12.0 - 15.0 g/dL   HCT 25.4 (*) 36.0 - 46.0 %   MCV 94.8  78.0 - 100.0 fL   MCH 29.5  26.0 - 34.0 pg   MCHC 31.1  30.0 - 36.0 g/dL   RDW 13.4  11.5 - 15.5 %   Platelets 215  150 - 400 K/uL  GLUCOSE, CAPILLARY     Status: Abnormal   Collection Time    11/09/13  8:06 AM      Result Value Ref Range   Glucose-Capillary 133 (*) 70 - 99 mg/dL  GLUCOSE, CAPILLARY     Status: Abnormal   Collection Time    11/09/13 11:35 AM      Result Value Ref Range   Glucose-Capillary 114 (*) 70 - 99 mg/dL  OCCULT BLOOD X 1 CARD TO LAB, STOOL     Status: Abnormal   Collection Time    11/09/13  1:34 PM      Result Value Ref Range   Fecal Occult Bld POSITIVE (*) NEGATIVE  PREPARE RBC (CROSSMATCH)     Status: None   Collection Time    11/09/13  3:26 PM      Result Value Ref Range   Order Confirmation ORDER PROCESSED BY BLOOD BANK    PROTIME-INR     Status: None   Collection Time    11/09/13  3:50 PM  Result Value Ref Range   Prothrombin Time 14.3  11.6 - 15.2 seconds   INR 1.13  0.00 - 1.49  TYPE AND SCREEN     Status: None   Collection Time    11/09/13  4:20 PM      Result Value Ref Range   ABO/RH(D) O POS     Antibody Screen NEG     Sample Expiration 11/12/2013     Unit Number [id number]     Blood Component Type RBC LR PHER2     Unit division 00     Status of Unit ISSUED,FINAL      Transfusion Status OK TO TRANSFUSE     Crossmatch Result Compatible     Unit Number [id number]     Blood Component Type RBC LR PHER1     Unit division 00     Status of Unit ISSUED,FINAL     Transfusion Status OK TO TRANSFUSE     Crossmatch Result Compatible     Unit Number [id number]     Blood Component Type RBC LR PHER2     Unit division 00     Status of Unit REL FROM [facility]     Transfusion Status OK TO TRANSFUSE     Crossmatch Result Compatible    ABO/RH     Status: None   Collection Time    11/09/13  4:20 PM      Result Value Ref Range   ABO/RH(D) O POS    GLUCOSE, CAPILLARY     Status: Abnormal   Collection Time    11/09/13  4:43 PM      Result Value Ref Range   Glucose-Capillary 102 (*) 70 - 99 mg/dL  GLUCOSE, CAPILLARY     Status: Abnormal   Collection Time    11/09/13  9:34 PM      Result Value Ref Range   Glucose-Capillary 110 (*) 70 - 99 mg/dL  BASIC METABOLIC PANEL     Status: Abnormal   Collection Time    11/10/13  3:10 AM      Result Value Ref Range   Sodium 141  137 - 147 mEq/L   Potassium 4.4  3.7 - 5.3 mEq/L   Chloride 102  96 - 112 mEq/L   CO2 22  19 - 32 mEq/L   Glucose, Bld 137 (*) 70 - 99 mg/dL   BUN 33 (*) 6 - 23 mg/dL   Comment: DELTA CHECK NOTED   Creatinine, Ser 1.38 (*) 0.50 - 1.10 mg/dL   Calcium 9.5  8.4 - 10.5 mg/dL   GFR calc non Af Amer 35 (*) >90 mL/min   GFR calc Af Amer 40 (*) >90 mL/min   Comment: (NOTE)     The eGFR has been calculated using the CKD EPI equation.     This calculation has not been validated in all clinical situations.     eGFR's persistently <90 mL/min signify possible Chronic Kidney     Disease.  HEPARIN LEVEL (UNFRACTIONATED)     Status: None   Collection Time    11/10/13  3:10 AM      Result Value Ref Range   Heparin Unfractionated 0.33  0.30 - 0.70 IU/mL   Comment:            IF HEPARIN RESULTS ARE BELOW     EXPECTED VALUES, AND PATIENT     DOSAGE HAS BEEN CONFIRMED,     SUGGEST FOLLOW UP TESTING      OF  ANTITHROMBIN III LEVELS.  CBC     Status: Abnormal   Collection Time    11/10/13  3:10 AM      Result Value Ref Range   WBC 8.1  4.0 - 10.5 K/uL   RBC 3.75 (*) 3.87 - 5.11 MIL/uL   Hemoglobin 10.8 (*) 12.0 - 15.0 g/dL   Comment: POST TRANSFUSION SPECIMEN   HCT 33.7 (*) 36.0 - 46.0 %   MCV 89.9  78.0 - 100.0 fL   MCH 28.8  26.0 - 34.0 pg   MCHC 32.0  30.0 - 36.0 g/dL   RDW 16.1 (*) 11.5 - 15.5 %   Platelets 205  150 - 400 K/uL  GLUCOSE, CAPILLARY     Status: Abnormal   Collection Time    11/10/13  8:06 AM      Result Value Ref Range   Glucose-Capillary 143 (*) 70 - 99 mg/dL  CBC     Status: Abnormal   Collection Time    11/10/13 10:55 AM      Result Value Ref Range   WBC 6.8  4.0 - 10.5 K/uL   RBC 3.69 (*) 3.87 - 5.11 MIL/uL   Hemoglobin 10.6 (*) 12.0 - 15.0 g/dL   HCT 32.9 (*) 36.0 - 46.0 %   MCV 89.2  78.0 - 100.0 fL   MCH 28.7  26.0 - 34.0 pg   MCHC 32.2  30.0 - 36.0 g/dL   RDW 16.1 (*) 11.5 - 15.5 %   Platelets 207  150 - 400 K/uL    Imaging: Imaging results have been reviewed  Assessment/Plan:   1. Active Problems: 2.   DIABETES MELLITUS, TYPE II, UNCONTROLLED 3.   HYPERLIPIDEMIA 4.   HYPERTENSION 5.   Chest pain 6.   NSTEMI (non-ST elevated myocardial infarction) 7.   Iron deficiency anemia 8.   Acute heart failure 9. LV dysfunction, severe MR  Time Spent Directly with Patient:  30 minutes  Length of Stay:  LOS: 2 days   Admitted with NSTEMI/APE requiring BiPAP, IV diuresis. 2D shows EF 30% with severe MR. I/O neg 4.5 liters. Feeling much better. Denies CP/SOB. She was guiac + with declining HGB on IV hep s/p Tx 2 u PRBCs with resulting appropriate increase Hgb.For R/L heart cath today.  Lorretta Harp 11/10/2013, 11:31 AM

## 2013-11-11 ENCOUNTER — Encounter (HOSPITAL_COMMUNITY): Payer: Self-pay | Admitting: Certified Registered Nurse Anesthetist

## 2013-11-11 ENCOUNTER — Encounter (HOSPITAL_COMMUNITY): Payer: Medicare Other | Admitting: Certified Registered Nurse Anesthetist

## 2013-11-11 ENCOUNTER — Inpatient Hospital Stay (HOSPITAL_COMMUNITY): Payer: Medicare Other | Admitting: Certified Registered Nurse Anesthetist

## 2013-11-11 ENCOUNTER — Encounter (HOSPITAL_COMMUNITY)
Admission: EM | Disposition: A | Discharge status: Home Health | Payer: Medicare Other | Source: Home / Self Care | Attending: Thoracic Surgery (Cardiothoracic Vascular Surgery)

## 2013-11-11 ENCOUNTER — Inpatient Hospital Stay (HOSPITAL_COMMUNITY): Payer: Medicare Other

## 2013-11-11 DIAGNOSIS — D509 Iron deficiency anemia, unspecified: Secondary | ICD-10-CM

## 2013-11-11 DIAGNOSIS — I2589 Other forms of chronic ischemic heart disease: Secondary | ICD-10-CM

## 2013-11-11 DIAGNOSIS — I359 Nonrheumatic aortic valve disorder, unspecified: Secondary | ICD-10-CM

## 2013-11-11 DIAGNOSIS — Z01810 Encounter for preprocedural cardiovascular examination: Secondary | ICD-10-CM

## 2013-11-11 DIAGNOSIS — Z951 Presence of aortocoronary bypass graft: Secondary | ICD-10-CM

## 2013-11-11 HISTORY — PX: MITRAL VALVE REPLACEMENT (MVR)/CORONARY ARTERY BYPASS GRAFTING (CABG): SHX5984

## 2013-11-11 LAB — PLATELET COUNT: PLATELETS: 65 10*3/uL — AB (ref 150–400)

## 2013-11-11 LAB — POCT I-STAT, CHEM 8
BUN: 20 mg/dL (ref 6–23)
Calcium, Ion: 1.08 mmol/L — ABNORMAL LOW (ref 1.13–1.30)
Chloride: 117 mEq/L — ABNORMAL HIGH (ref 96–112)
Creatinine, Ser: 1 mg/dL (ref 0.50–1.10)
Glucose, Bld: 120 mg/dL — ABNORMAL HIGH (ref 70–99)
HCT: 28 % — ABNORMAL LOW (ref 36.0–46.0)
HEMOGLOBIN: 9.5 g/dL — AB (ref 12.0–15.0)
POTASSIUM: 4.6 meq/L (ref 3.7–5.3)
SODIUM: 140 meq/L (ref 137–147)
TCO2: 19 mmol/L (ref 0–100)

## 2013-11-11 LAB — CBC
HEMATOCRIT: 30 % — AB (ref 36.0–46.0)
HEMATOCRIT: 29.7 % — AB (ref 36.0–46.0)
HEMOGLOBIN: 9.9 g/dL — AB (ref 12.0–15.0)
Hemoglobin: 9.9 g/dL — ABNORMAL LOW (ref 12.0–15.0)
MCH: 29.7 pg (ref 26.0–34.0)
MCH: 29.6 pg (ref 26.0–34.0)
MCHC: 33 g/dL (ref 30.0–36.0)
MCHC: 33.3 g/dL (ref 30.0–36.0)
MCV: 90.1 fL (ref 78.0–100.0)
MCV: 88.7 fL (ref 78.0–100.0)
PLATELETS: 105 10*3/uL — AB (ref 150–400)
Platelets: 132 10*3/uL — ABNORMAL LOW (ref 150–400)
RBC: 3.33 MIL/uL — AB (ref 3.87–5.11)
RBC: 3.35 MIL/uL — ABNORMAL LOW (ref 3.87–5.11)
RDW: 15.8 % — ABNORMAL HIGH (ref 11.5–15.5)
RDW: 15.8 % — ABNORMAL HIGH (ref 11.5–15.5)
WBC: 11.9 10*3/uL — AB (ref 4.0–10.5)
WBC: 14.9 10*3/uL — ABNORMAL HIGH (ref 4.0–10.5)

## 2013-11-11 LAB — POCT I-STAT 4, (NA,K, GLUC, HGB,HCT)
GLUCOSE: 144 mg/dL — AB (ref 70–99)
Glucose, Bld: 114 mg/dL — ABNORMAL HIGH (ref 70–99)
Glucose, Bld: 121 mg/dL — ABNORMAL HIGH (ref 70–99)
Glucose, Bld: 143 mg/dL — ABNORMAL HIGH (ref 70–99)
Glucose, Bld: 135 mg/dL — ABNORMAL HIGH (ref 70–99)
Glucose, Bld: 110 mg/dL — ABNORMAL HIGH (ref 70–99)
Glucose, Bld: 132 mg/dL — ABNORMAL HIGH (ref 70–99)
Glucose, Bld: 119 mg/dL — ABNORMAL HIGH (ref 70–99)
HCT: 28 % — ABNORMAL LOW (ref 36.0–46.0)
HCT: 25 % — ABNORMAL LOW (ref 36.0–46.0)
HCT: 26 % — ABNORMAL LOW (ref 36.0–46.0)
HCT: 26 % — ABNORMAL LOW (ref 36.0–46.0)
HCT: 21 % — ABNORMAL LOW (ref 36.0–46.0)
HCT: 22 % — ABNORMAL LOW (ref 36.0–46.0)
HCT: 28 % — ABNORMAL LOW (ref 36.0–46.0)
HEMATOCRIT: 26 % — AB (ref 36.0–46.0)
HEMOGLOBIN: 9.5 g/dL — AB (ref 12.0–15.0)
HEMOGLOBIN: 8.8 g/dL — AB (ref 12.0–15.0)
HEMOGLOBIN: 8.8 g/dL — AB (ref 12.0–15.0)
Hemoglobin: 8.5 g/dL — ABNORMAL LOW (ref 12.0–15.0)
Hemoglobin: 8.8 g/dL — ABNORMAL LOW (ref 12.0–15.0)
Hemoglobin: 7.1 g/dL — ABNORMAL LOW (ref 12.0–15.0)
Hemoglobin: 7.5 g/dL — ABNORMAL LOW (ref 12.0–15.0)
Hemoglobin: 9.5 g/dL — ABNORMAL LOW (ref 12.0–15.0)
POTASSIUM: 3.8 meq/L (ref 3.7–5.3)
POTASSIUM: 4.1 meq/L (ref 3.7–5.3)
Potassium: 3.8 mEq/L (ref 3.7–5.3)
Potassium: 4.4 mEq/L (ref 3.7–5.3)
Potassium: 4.8 mEq/L (ref 3.7–5.3)
Potassium: 3.6 mEq/L — ABNORMAL LOW (ref 3.7–5.3)
Potassium: 4.2 mEq/L (ref 3.7–5.3)
Potassium: 4 mEq/L (ref 3.7–5.3)
SODIUM: 145 meq/L (ref 137–147)
SODIUM: 141 meq/L (ref 137–147)
SODIUM: 144 meq/L (ref 137–147)
Sodium: 143 mEq/L (ref 137–147)
Sodium: 141 mEq/L (ref 137–147)
Sodium: 142 mEq/L (ref 137–147)
Sodium: 145 mEq/L (ref 137–147)
Sodium: 143 mEq/L (ref 137–147)

## 2013-11-11 LAB — POCT I-STAT 3, ART BLOOD GAS (G3+)
ACID-BASE DEFICIT: 2 mmol/L (ref 0.0–2.0)
ACID-BASE DEFICIT: 3 mmol/L — AB (ref 0.0–2.0)
Acid-base deficit: 5 mmol/L — ABNORMAL HIGH (ref 0.0–2.0)
BICARBONATE: 22.8 meq/L (ref 20.0–24.0)
BICARBONATE: 20.7 meq/L (ref 20.0–24.0)
Bicarbonate: 23.2 mEq/L (ref 20.0–24.0)
O2 Saturation: 100 %
O2 Saturation: 100 %
O2 Saturation: 90 %
PO2 ART: 58 mmHg — AB (ref 80.0–100.0)
Patient temperature: 35.8
TCO2: 24 mmol/L (ref 0–100)
TCO2: 24 mmol/L (ref 0–100)
TCO2: 22 mmol/L (ref 0–100)
pCO2 arterial: 42.6 mmHg (ref 35.0–45.0)
pCO2 arterial: 42.6 mmHg (ref 35.0–45.0)
pCO2 arterial: 39.4 mmHg (ref 35.0–45.0)
pH, Arterial: 7.344 — ABNORMAL LOW (ref 7.350–7.450)
pH, Arterial: 7.336 — ABNORMAL LOW (ref 7.350–7.450)
pH, Arterial: 7.322 — ABNORMAL LOW (ref 7.350–7.450)
pO2, Arterial: 376 mmHg — ABNORMAL HIGH (ref 80.0–100.0)
pO2, Arterial: 261 mmHg — ABNORMAL HIGH (ref 80.0–100.0)

## 2013-11-11 LAB — BASIC METABOLIC PANEL
BUN: 31 mg/dL — ABNORMAL HIGH (ref 6–23)
CO2: 22 meq/L (ref 19–32)
Calcium: 8.9 mg/dL (ref 8.4–10.5)
Chloride: 107 mEq/L (ref 96–112)
Creatinine, Ser: 1.51 mg/dL — ABNORMAL HIGH (ref 0.50–1.10)
GFR calc Af Amer: 36 mL/min — ABNORMAL LOW (ref >90)
GFR calc non Af Amer: 31 mL/min — ABNORMAL LOW (ref >90)
Glucose, Bld: 136 mg/dL — ABNORMAL HIGH (ref 70–99)
Potassium: 4.7 mEq/L (ref 3.7–5.3)
SODIUM: 144 meq/L (ref 137–147)

## 2013-11-11 LAB — HEMOGLOBIN A1C
HEMOGLOBIN A1C: 6.1 % — AB (ref <5.7)
Mean Plasma Glucose: 128 mg/dL — ABNORMAL HIGH (ref <117)

## 2013-11-11 LAB — URINALYSIS, ROUTINE W REFLEX MICROSCOPIC
Bilirubin Urine: NEGATIVE
Glucose, UA: NEGATIVE mg/dL
Ketones, ur: NEGATIVE mg/dL
LEUKOCYTES UA: NEGATIVE
NITRITE: NEGATIVE
Protein, ur: NEGATIVE mg/dL
SPECIFIC GRAVITY, URINE: 1.02 (ref 1.005–1.030)
Urobilinogen, UA: 0.2 mg/dL (ref 0.0–1.0)
pH: 5 (ref 5.0–8.0)

## 2013-11-11 LAB — GLUCOSE, CAPILLARY
GLUCOSE-CAPILLARY: 84 mg/dL (ref 70–99)
GLUCOSE-CAPILLARY: 98 mg/dL (ref 70–99)
Glucose-Capillary: 103 mg/dL — ABNORMAL HIGH (ref 70–99)
Glucose-Capillary: 99 mg/dL (ref 70–99)
Glucose-Capillary: 112 mg/dL — ABNORMAL HIGH (ref 70–99)

## 2013-11-11 LAB — URINE MICROSCOPIC-ADD ON

## 2013-11-11 LAB — PROTIME-INR
INR: 1.97 — ABNORMAL HIGH (ref 0.00–1.49)
Prothrombin Time: 21.8 seconds — ABNORMAL HIGH (ref 11.6–15.2)

## 2013-11-11 LAB — HEMOGLOBIN AND HEMATOCRIT, BLOOD
HCT: 28.2 % — ABNORMAL LOW (ref 36.0–46.0)
HEMOGLOBIN: 9.2 g/dL — AB (ref 12.0–15.0)

## 2013-11-11 LAB — PREPARE RBC (CROSSMATCH)

## 2013-11-11 LAB — APTT: APTT: 39 s — AB (ref 24–37)

## 2013-11-11 SURGERY — MITRAL VALVE REPLACEMENT (MVR)/CORONARY ARTERY BYPASS GRAFTING (CABG)
Anesthesia: General

## 2013-11-11 MED ORDER — PROPOFOL 10 MG/ML IV BOLUS
INTRAVENOUS | Status: AC
  Filled 2013-11-11: qty 20

## 2013-11-11 MED ORDER — 0.9 % SODIUM CHLORIDE (POUR BTL) OPTIME
TOPICAL | Status: DC | PRN
  Administered 2013-11-11: 1000 mL

## 2013-11-11 MED ORDER — FENTANYL CITRATE 0.05 MG/ML IJ SOLN
INTRAMUSCULAR | Status: AC
  Filled 2013-11-11: qty 5

## 2013-11-11 MED ORDER — LACTATED RINGERS IV SOLN
INTRAVENOUS | Status: DC
  Administered 2013-11-13: 01:00:00 via INTRAVENOUS

## 2013-11-11 MED ORDER — AMIODARONE HCL IN DEXTROSE 360-4.14 MG/200ML-% IV SOLN
30.0000 mg/h | INTRAVENOUS | Status: DC
  Filled 2013-11-11: qty 200

## 2013-11-11 MED ORDER — SODIUM CHLORIDE 0.45 % IV SOLN
INTRAVENOUS | Status: DC
  Administered 2013-11-11 – 2013-11-13 (×2): via INTRAVENOUS

## 2013-11-11 MED ORDER — ROCURONIUM BROMIDE 50 MG/5ML IV SOLN
INTRAVENOUS | Status: AC
  Filled 2013-11-11: qty 2

## 2013-11-11 MED ORDER — METOPROLOL TARTRATE 12.5 MG HALF TABLET
12.5000 mg | ORAL_TABLET | Freq: Two times a day (BID) | ORAL | Status: DC
  Filled 2013-11-11 (×5): qty 1

## 2013-11-11 MED ORDER — ROCURONIUM BROMIDE 100 MG/10ML IV SOLN
INTRAVENOUS | Status: DC | PRN
  Administered 2013-11-11: 30 mg via INTRAVENOUS
  Administered 2013-11-11: 70 mg via INTRAVENOUS
  Administered 2013-11-11: 50 mg via INTRAVENOUS

## 2013-11-11 MED ORDER — FAMOTIDINE IN NACL 20-0.9 MG/50ML-% IV SOLN
20.0000 mg | Freq: Two times a day (BID) | INTRAVENOUS | Status: AC
  Administered 2013-11-11 – 2013-11-12 (×2): 20 mg via INTRAVENOUS
  Filled 2013-11-11: qty 50

## 2013-11-11 MED ORDER — SODIUM CHLORIDE 0.9 % IJ SOLN
3.0000 mL | Freq: Two times a day (BID) | INTRAMUSCULAR | Status: DC
  Administered 2013-11-12 – 2013-11-17 (×9): 3 mL via INTRAVENOUS

## 2013-11-11 MED ORDER — BISACODYL 10 MG RE SUPP
10.0000 mg | Freq: Every day | RECTAL | Status: DC
  Filled 2013-11-11: qty 1

## 2013-11-11 MED ORDER — LACTATED RINGERS IV SOLN
INTRAVENOUS | Status: DC | PRN
  Administered 2013-11-11: 10:00:00 via INTRAVENOUS

## 2013-11-11 MED ORDER — VANCOMYCIN HCL IN DEXTROSE 1-5 GM/200ML-% IV SOLN
1000.0000 mg | Freq: Once | INTRAVENOUS | Status: AC
  Administered 2013-11-11: 1000 mg via INTRAVENOUS
  Filled 2013-11-11: qty 200

## 2013-11-11 MED ORDER — ONDANSETRON HCL 4 MG/2ML IJ SOLN
4.0000 mg | Freq: Four times a day (QID) | INTRAMUSCULAR | Status: DC | PRN
  Administered 2013-11-15: 4 mg via INTRAVENOUS
  Filled 2013-11-11: qty 2

## 2013-11-11 MED ORDER — SODIUM CHLORIDE 0.9 % IV SOLN
10.0000 g | INTRAVENOUS | Status: DC | PRN
  Administered 2013-11-11: 5 g/h via INTRAVENOUS

## 2013-11-11 MED ORDER — ASPIRIN 81 MG PO CHEW
324.0000 mg | CHEWABLE_TABLET | Freq: Every day | ORAL | Status: DC
  Administered 2013-11-12 – 2013-11-14 (×3): 324 mg
  Filled 2013-11-11 (×3): qty 4

## 2013-11-11 MED ORDER — PHENYLEPHRINE HCL 10 MG/ML IJ SOLN
10.0000 mg | INTRAVENOUS | Status: DC | PRN
  Administered 2013-11-11: 10 ug/min via INTRAVENOUS

## 2013-11-11 MED ORDER — METOPROLOL TARTRATE 1 MG/ML IV SOLN: 2.5000 mg | INTRAVENOUS | Status: DC | PRN

## 2013-11-11 MED ORDER — DEXTROSE 5 % IV SOLN
1.5000 g | Freq: Two times a day (BID) | INTRAVENOUS | Status: AC
  Administered 2013-11-12 (×2): 1.5 g via INTRAVENOUS
  Filled 2013-11-11 (×2): qty 1.5

## 2013-11-11 MED ORDER — SODIUM CHLORIDE 0.9 % IV SOLN
250.0000 mL | INTRAVENOUS | Status: DC
  Administered 2013-11-12: 250 mL via INTRAVENOUS

## 2013-11-11 MED ORDER — PHENYLEPHRINE HCL 10 MG/ML IJ SOLN
INTRAMUSCULAR | Status: DC | PRN
  Administered 2013-11-11: 20 ug via INTRAVENOUS

## 2013-11-11 MED ORDER — MORPHINE SULFATE 2 MG/ML IJ SOLN
2.0000 mg | INTRAMUSCULAR | Status: DC | PRN
  Administered 2013-11-12: 4 mg via INTRAVENOUS
  Administered 2013-11-12 (×2): 2 mg via INTRAVENOUS
  Administered 2013-11-12 – 2013-11-13 (×4): 4 mg via INTRAVENOUS
  Administered 2013-11-13 – 2013-11-14 (×3): 2 mg via INTRAVENOUS
  Filled 2013-11-11: qty 2
  Filled 2013-11-11: qty 1
  Filled 2013-11-11: qty 2
  Filled 2013-11-11 (×2): qty 1
  Filled 2013-11-11: qty 2
  Filled 2013-11-11: qty 1
  Filled 2013-11-11: qty 2
  Filled 2013-11-11: qty 1
  Filled 2013-11-11: qty 2

## 2013-11-11 MED ORDER — ACETAMINOPHEN 500 MG PO TABS
1000.0000 mg | ORAL_TABLET | Freq: Four times a day (QID) | ORAL | Status: AC
  Administered 2013-11-12 – 2013-11-16 (×5): 1000 mg via ORAL
  Filled 2013-11-11 (×18): qty 2

## 2013-11-11 MED ORDER — SODIUM CHLORIDE 0.9 % IV SOLN
INTRAVENOUS | Status: DC
  Administered 2013-11-11 – 2013-11-16 (×2): via INTRAVENOUS
  Administered 2013-11-18: 10 mL/h via INTRAVENOUS

## 2013-11-11 MED ORDER — NITROGLYCERIN IN D5W 200-5 MCG/ML-% IV SOLN
INTRAVENOUS | Status: DC | PRN
  Administered 2013-11-11: 5 ug/min via INTRAVENOUS

## 2013-11-11 MED ORDER — METOPROLOL TARTRATE 25 MG/10 ML ORAL SUSPENSION
12.5000 mg | Freq: Two times a day (BID) | ORAL | Status: DC
Start: 2013-11-11 — End: 2013-11-13
  Administered 2013-11-12 – 2013-11-13 (×2): 12.5 mg
  Filled 2013-11-11 (×5): qty 5

## 2013-11-11 MED ORDER — MIDAZOLAM HCL 2 MG/2ML IJ SOLN
2.0000 mg | INTRAMUSCULAR | Status: DC | PRN
  Administered 2013-11-12 – 2013-11-14 (×13): 2 mg via INTRAVENOUS
  Filled 2013-11-11 (×13): qty 2

## 2013-11-11 MED ORDER — MORPHINE SULFATE 2 MG/ML IJ SOLN
1.0000 mg | INTRAMUSCULAR | Status: AC | PRN
  Administered 2013-11-11: 1 mg via INTRAVENOUS
  Administered 2013-11-12 (×2): 2 mg via INTRAVENOUS
  Filled 2013-11-11 (×3): qty 1

## 2013-11-11 MED ORDER — PROTAMINE SULFATE 10 MG/ML IV SOLN
INTRAVENOUS | Status: DC | PRN
  Administered 2013-11-11: 60 mg via INTRAVENOUS
  Administered 2013-11-11 (×3): 40 mg via INTRAVENOUS
  Administered 2013-11-11: 30 mg via INTRAVENOUS
  Administered 2013-11-11: 40 mg via INTRAVENOUS

## 2013-11-11 MED ORDER — ACETAMINOPHEN 650 MG RE SUPP
650.0000 mg | Freq: Once | RECTAL | Status: AC
  Administered 2013-11-11: 650 mg via RECTAL

## 2013-11-11 MED ORDER — SODIUM CHLORIDE 0.9 % IV SOLN
100.0000 [IU] | INTRAVENOUS | Status: DC | PRN
  Administered 2013-11-11: 1 [IU]/h via INTRAVENOUS

## 2013-11-11 MED ORDER — 0.9 % SODIUM CHLORIDE (POUR BTL) OPTIME
TOPICAL | Status: DC | PRN
  Administered 2013-11-11: 5000 mL

## 2013-11-11 MED ORDER — MAGNESIUM SULFATE 4000MG/100ML IJ SOLN
4.0000 g | Freq: Once | INTRAMUSCULAR | Status: AC
  Administered 2013-11-11: 4 g via INTRAVENOUS
  Filled 2013-11-11: qty 100

## 2013-11-11 MED ORDER — MILRINONE IN DEXTROSE 20 MG/100ML IV SOLN
0.1250 ug/kg/min | INTRAVENOUS | Status: DC
  Filled 2013-11-11: qty 100

## 2013-11-11 MED ORDER — BISACODYL 5 MG PO TBEC
10.0000 mg | DELAYED_RELEASE_TABLET | Freq: Every day | ORAL | Status: DC
  Administered 2013-11-12 – 2013-11-17 (×3): 10 mg via ORAL
  Filled 2013-11-11 (×3): qty 2

## 2013-11-11 MED ORDER — SODIUM CHLORIDE 0.9 % IV SOLN
INTRAVENOUS | Status: DC
  Administered 2013-11-11: 21:00:00 via INTRAVENOUS
  Filled 2013-11-11 (×2): qty 1

## 2013-11-11 MED ORDER — POTASSIUM CHLORIDE 10 MEQ/50ML IV SOLN: 10.0000 meq | INTRAVENOUS | Status: AC

## 2013-11-11 MED ORDER — PHENYLEPHRINE HCL 10 MG/ML IJ SOLN
0.0000 ug/min | INTRAVENOUS | Status: DC
  Administered 2013-11-13: 10 ug/min via INTRAVENOUS
  Filled 2013-11-11 (×2): qty 2

## 2013-11-11 MED ORDER — MIDAZOLAM HCL 5 MG/5ML IJ SOLN
INTRAMUSCULAR | Status: DC | PRN
  Administered 2013-11-11: 2 mg via INTRAVENOUS
  Administered 2013-11-11: 3 mg via INTRAVENOUS
  Administered 2013-11-11: 5 mg via INTRAVENOUS

## 2013-11-11 MED ORDER — AMIODARONE HCL IN DEXTROSE 360-4.14 MG/200ML-% IV SOLN
60.0000 mg/h | INTRAVENOUS | Status: DC
  Filled 2013-11-11: qty 200

## 2013-11-11 MED ORDER — HEMOSTATIC AGENTS (NO CHARGE) OPTIME
TOPICAL | Status: DC | PRN
  Administered 2013-11-11: 1 via TOPICAL

## 2013-11-11 MED ORDER — DOPAMINE-DEXTROSE 3.2-5 MG/ML-% IV SOLN
INTRAVENOUS | Status: DC | PRN
  Administered 2013-11-11: 3 ug/kg/min via INTRAVENOUS

## 2013-11-11 MED ORDER — ACETAMINOPHEN 160 MG/5ML PO SOLN
1000.0000 mg | Freq: Four times a day (QID) | ORAL | Status: AC
Start: 2013-11-12 — End: 2013-11-16
  Administered 2013-11-11 – 2013-11-14 (×8): 1000 mg
  Filled 2013-11-11 (×6): qty 40.6

## 2013-11-11 MED ORDER — INSULIN REGULAR BOLUS VIA INFUSION
0.0000 [IU] | Freq: Three times a day (TID) | INTRAVENOUS | Status: DC
  Filled 2013-11-11: qty 10

## 2013-11-11 MED ORDER — NITROGLYCERIN IN D5W 200-5 MCG/ML-% IV SOLN: 0.0000 ug/min | INTRAVENOUS | Status: DC

## 2013-11-11 MED ORDER — SODIUM CHLORIDE 0.9 % IJ SOLN
OROMUCOSAL | Status: DC | PRN
  Administered 2013-11-11 (×3): via TOPICAL

## 2013-11-11 MED ORDER — HEPARIN SODIUM (PORCINE) 1000 UNIT/ML IJ SOLN
INTRAMUSCULAR | Status: AC
  Filled 2013-11-11: qty 1

## 2013-11-11 MED ORDER — SODIUM CHLORIDE 0.9 % IV SOLN
200.0000 ug | INTRAVENOUS | Status: DC | PRN
  Administered 2013-11-11: 0.2 ug/kg/h via INTRAVENOUS

## 2013-11-11 MED ORDER — MILRINONE IN DEXTROSE 20 MG/100ML IV SOLN
INTRAVENOUS | Status: DC | PRN
  Administered 2013-11-11: 0.375 ug/kg/min via INTRAVENOUS

## 2013-11-11 MED ORDER — PROPOFOL 10 MG/ML IV BOLUS
INTRAVENOUS | Status: DC | PRN
  Administered 2013-11-11: 30 mg via INTRAVENOUS
  Administered 2013-11-11: 50 mg via INTRAVENOUS

## 2013-11-11 MED ORDER — DOPAMINE-DEXTROSE 3.2-5 MG/ML-% IV SOLN
3.0000 ug/kg/min | INTRAVENOUS | Status: DC
  Administered 2013-11-11 – 2013-11-15 (×2): 3 ug/kg/min via INTRAVENOUS
  Filled 2013-11-11 (×2): qty 250

## 2013-11-11 MED ORDER — FENTANYL CITRATE 0.05 MG/ML IJ SOLN
INTRAMUSCULAR | Status: DC | PRN
  Administered 2013-11-11: 250 ug via INTRAVENOUS
  Administered 2013-11-11 (×2): 150 ug via INTRAVENOUS
  Administered 2013-11-11 (×2): 250 ug via INTRAVENOUS
  Administered 2013-11-11: 100 ug via INTRAVENOUS
  Administered 2013-11-11: 250 ug via INTRAVENOUS
  Administered 2013-11-11: 100 ug via INTRAVENOUS

## 2013-11-11 MED ORDER — MILRINONE IN DEXTROSE 20 MG/100ML IV SOLN
0.3000 ug/kg/min | INTRAVENOUS | Status: DC
  Administered 2013-11-11 – 2013-11-12 (×2): 0.3 ug/kg/min via INTRAVENOUS
  Filled 2013-11-11: qty 100

## 2013-11-11 MED ORDER — PANTOPRAZOLE SODIUM 40 MG PO TBEC: 40.0000 mg | DELAYED_RELEASE_TABLET | Freq: Every day | ORAL | Status: DC

## 2013-11-11 MED ORDER — DEXMEDETOMIDINE HCL IN NACL 200 MCG/50ML IV SOLN
0.1000 ug/kg/h | INTRAVENOUS | Status: DC
  Administered 2013-11-12 (×2): 0.7 ug/kg/h via INTRAVENOUS
  Administered 2013-11-12: 0.2 ug/kg/h via INTRAVENOUS
  Administered 2013-11-13 – 2013-11-14 (×5): 0.7 ug/kg/h via INTRAVENOUS
  Filled 2013-11-11 (×10): qty 50

## 2013-11-11 MED ORDER — SODIUM CHLORIDE 0.9 % IJ SOLN: 3.0000 mL | INTRAMUSCULAR | Status: DC | PRN

## 2013-11-11 MED ORDER — LACTATED RINGERS IV SOLN
INTRAVENOUS | Status: DC | PRN
  Administered 2013-11-11 (×2): via INTRAVENOUS

## 2013-11-11 MED ORDER — ACETAMINOPHEN 160 MG/5ML PO SOLN: 650.0000 mg | Freq: Once | ORAL | Status: AC

## 2013-11-11 MED ORDER — ASPIRIN EC 325 MG PO TBEC
325.0000 mg | DELAYED_RELEASE_TABLET | Freq: Every day | ORAL | Status: DC
  Filled 2013-11-11: qty 1

## 2013-11-11 MED ORDER — LACTATED RINGERS IV SOLN: 500.0000 mL | Freq: Once | INTRAVENOUS | Status: AC | PRN

## 2013-11-11 MED ORDER — ALBUMIN HUMAN 5 % IV SOLN
250.0000 mL | INTRAVENOUS | Status: AC | PRN
  Administered 2013-11-11 – 2013-11-12 (×3): 250 mL via INTRAVENOUS
  Filled 2013-11-11: qty 250

## 2013-11-11 MED ORDER — EPHEDRINE SULFATE 50 MG/ML IJ SOLN
INTRAMUSCULAR | Status: DC | PRN
  Administered 2013-11-11: 5 mg via INTRAVENOUS

## 2013-11-11 MED ORDER — METOCLOPRAMIDE HCL 5 MG/ML IJ SOLN
10.0000 mg | Freq: Four times a day (QID) | INTRAMUSCULAR | Status: AC
  Administered 2013-11-11 – 2013-11-12 (×4): 10 mg via INTRAVENOUS
  Filled 2013-11-11 (×3): qty 2

## 2013-11-11 MED ORDER — DOCUSATE SODIUM 100 MG PO CAPS: 200.0000 mg | ORAL_CAPSULE | Freq: Every day | ORAL | Status: DC

## 2013-11-11 MED ORDER — OXYCODONE HCL 5 MG PO TABS: 5.0000 mg | ORAL_TABLET | ORAL | Status: DC | PRN

## 2013-11-11 MED ORDER — HEPARIN SODIUM (PORCINE) 1000 UNIT/ML IJ SOLN
INTRAMUSCULAR | Status: DC | PRN
  Administered 2013-11-11: 8 mL via INTRAVENOUS
  Administered 2013-11-11: 5 mL via INTRAVENOUS
  Administered 2013-11-11: 2 mL via INTRAVENOUS
  Administered 2013-11-11 (×2): 5 mL via INTRAVENOUS

## 2013-11-11 MED ORDER — ALBUMIN HUMAN 5 % IV SOLN
INTRAVENOUS | Status: DC | PRN
  Administered 2013-11-11: 17:00:00 via INTRAVENOUS

## 2013-11-11 MED ORDER — MIDAZOLAM HCL 10 MG/2ML IJ SOLN
INTRAMUSCULAR | Status: AC
  Filled 2013-11-11: qty 2

## 2013-11-11 SURGICAL SUPPLY — 98 items
ATTRACTOMAT 16X20 MAGNETIC DRP (DRAPES) ×3 IMPLANT
BAG DECANTER FOR FLEXI CONT (MISCELLANEOUS) ×3 IMPLANT
BANDAGE ELASTIC 4 VELCRO ST LF (GAUZE/BANDAGES/DRESSINGS) ×3 IMPLANT
BANDAGE ELASTIC 6 VELCRO ST LF (GAUZE/BANDAGES/DRESSINGS) ×3 IMPLANT
BANDAGE GAUZE ELAST BULKY 4 IN (GAUZE/BANDAGES/DRESSINGS) ×3 IMPLANT
BASKET HEART  (ORDER IN 25'S) (MISCELLANEOUS) ×1
BASKET HEART (ORDER IN 25'S) (MISCELLANEOUS) ×1
BASKET HEART (ORDER IN 25S) (MISCELLANEOUS) ×1 IMPLANT
BLADE STERNUM SYSTEM 6 (BLADE) ×3 IMPLANT
BNDG GAUZE ELAST 4 BULKY (GAUZE/BANDAGES/DRESSINGS) ×3 IMPLANT
CANISTER SUCTION 2500CC (MISCELLANEOUS) ×3 IMPLANT
CANN PRFSN 3/8XCNCT ST RT ANG (MISCELLANEOUS) ×1
CANNULA EZ GLIDE AORTIC 21FR (CANNULA) ×3 IMPLANT
CANNULA PRFSN 3/8XCNCT RT ANG (MISCELLANEOUS) IMPLANT
CANNULA VEN MTL TIP RT (MISCELLANEOUS) ×3
CANNULA VRC MALB SNGL STG 36FR (MISCELLANEOUS) IMPLANT
CARDIAC SUCTION (MISCELLANEOUS) ×3 IMPLANT
CATH CPB KIT HENDRICKSON (MISCELLANEOUS) ×3 IMPLANT
CATH ROBINSON RED A/P 18FR (CATHETERS) ×9 IMPLANT
CATH THORACIC 36FR (CATHETERS) ×3 IMPLANT
CATH THORACIC 36FR RT ANG (CATHETERS) ×3 IMPLANT
CLIP TI MEDIUM 24 (CLIP) IMPLANT
CLIP TI WIDE RED SMALL 24 (CLIP) ×4 IMPLANT
COVER SURGICAL LIGHT HANDLE (MISCELLANEOUS) ×3 IMPLANT
CRADLE DONUT ADULT HEAD (MISCELLANEOUS) ×3 IMPLANT
DRAPE CARDIOVASCULAR INCISE (DRAPES) ×3
DRAPE SLUSH/WARMER DISC (DRAPES) ×3 IMPLANT
DRAPE SRG 135X102X78XABS (DRAPES) ×1 IMPLANT
DRSG COVADERM 4X14 (GAUZE/BANDAGES/DRESSINGS) ×3 IMPLANT
ELECT REM PT RETURN 9FT ADLT (ELECTROSURGICAL) ×6
ELECTRODE REM PT RTRN 9FT ADLT (ELECTROSURGICAL) ×2 IMPLANT
GLOVE BIOGEL M 6.5 STRL (GLOVE) ×6 IMPLANT
GLOVE BIOGEL M STER SZ 6 (GLOVE) ×8 IMPLANT
GLOVE BIOGEL PI IND STRL 7.0 (GLOVE) IMPLANT
GLOVE BIOGEL PI INDICATOR 7.0 (GLOVE) ×4
GLOVE EUDERMIC 7 POWDERFREE (GLOVE) ×3 IMPLANT
GLOVE SURG SIGNA 7.5 PF LTX (GLOVE) ×6 IMPLANT
GOWN STRL REUS W/ TWL LRG LVL3 (GOWN DISPOSABLE) ×4 IMPLANT
GOWN STRL REUS W/ TWL XL LVL3 (GOWN DISPOSABLE) ×2 IMPLANT
GOWN STRL REUS W/TWL LRG LVL3 (GOWN DISPOSABLE) ×18
GOWN STRL REUS W/TWL XL LVL3 (GOWN DISPOSABLE) ×6
HEMOSTAT POWDER SURGIFOAM 1G (HEMOSTASIS) ×9 IMPLANT
HEMOSTAT SURGICEL 2X14 (HEMOSTASIS) ×3 IMPLANT
INSERT FOGARTY XLG (MISCELLANEOUS) ×3 IMPLANT
KIT BASIN OR (CUSTOM PROCEDURE TRAY) ×3 IMPLANT
KIT CATH SUCT 8FR (CATHETERS) ×2 IMPLANT
KIT ROOM TURNOVER OR (KITS) ×3 IMPLANT
KIT SUCTION CATH 14FR (SUCTIONS) ×6 IMPLANT
KIT VASOVIEW W/TROCAR VH 2000 (KITS) ×3 IMPLANT
LINE VENT (MISCELLANEOUS) ×2 IMPLANT
MARKER GRAFT CORONARY BYPASS (MISCELLANEOUS) ×9 IMPLANT
NS IRRIG 1000ML POUR BTL (IV SOLUTION) ×17 IMPLANT
PACK OPEN HEART (CUSTOM PROCEDURE TRAY) ×3 IMPLANT
PAD ARMBOARD 7.5X6 YLW CONV (MISCELLANEOUS) ×6 IMPLANT
PAD ELECT DEFIB RADIOL ZOLL (MISCELLANEOUS) ×3 IMPLANT
PENCIL BUTTON HOLSTER BLD 10FT (ELECTRODE) ×3 IMPLANT
PUNCH AORTIC ROTATE 4.0MM (MISCELLANEOUS) IMPLANT
PUNCH AORTIC ROTATE 4.5MM 8IN (MISCELLANEOUS) IMPLANT
PUNCH AORTIC ROTATE 5MM 8IN (MISCELLANEOUS) IMPLANT
RING MITRAL MEMO 3D 30MM SMD30 (Prosthesis & Implant Heart) ×2 IMPLANT
SET CARDIOPLEGIA MPS 5001102 (MISCELLANEOUS) ×2 IMPLANT
SOLUTION ANTI FOG 6CC (MISCELLANEOUS) ×3 IMPLANT
SPONGE GAUZE 4X4 12PLY (GAUZE/BANDAGES/DRESSINGS) ×6 IMPLANT
SUT BONE WAX W31G (SUTURE) ×3 IMPLANT
SUT ETHIBOND 2 0 SH (SUTURE) ×12 IMPLANT
SUT ETHIBOND 2 0 SH 36X2 (SUTURE) IMPLANT
SUT MNCRL AB 4-0 PS2 18 (SUTURE) ×4 IMPLANT
SUT PROLENE 3 0 SH DA (SUTURE) ×3 IMPLANT
SUT PROLENE 4 0 RB 1 (SUTURE) ×9
SUT PROLENE 4 0 SH DA (SUTURE) ×24 IMPLANT
SUT PROLENE 4-0 RB1 .5 CRCL 36 (SUTURE) IMPLANT
SUT PROLENE 6 0 C 1 30 (SUTURE) ×6 IMPLANT
SUT PROLENE 7 0 BV 1 (SUTURE) ×2 IMPLANT
SUT PROLENE 7 0 BV1 MDA (SUTURE) ×5 IMPLANT
SUT PROLENE 8 0 BV175 6 (SUTURE) IMPLANT
SUT STEEL 6MS V (SUTURE) ×3 IMPLANT
SUT STEEL STERNAL CCS#1 18IN (SUTURE) IMPLANT
SUT STEEL SZ 6 DBL 3X14 BALL (SUTURE) ×3 IMPLANT
SUT VIC AB 1 CTX 36 (SUTURE) ×6
SUT VIC AB 1 CTX36XBRD ANBCTR (SUTURE) ×2 IMPLANT
SUT VIC AB 2-0 CT1 27 (SUTURE) ×3
SUT VIC AB 2-0 CT1 TAPERPNT 27 (SUTURE) IMPLANT
SUT VIC AB 2-0 CTX 27 (SUTURE) IMPLANT
SUT VIC AB 3-0 SH 27 (SUTURE)
SUT VIC AB 3-0 SH 27X BRD (SUTURE) IMPLANT
SUT VIC AB 3-0 X1 27 (SUTURE) IMPLANT
SUT VICRYL 4-0 PS2 18IN ABS (SUTURE) IMPLANT
SUTURE E-PAK OPEN HEART (SUTURE) ×3 IMPLANT
SYSTEM SAHARA CHEST DRAIN ATS (WOUND CARE) ×3 IMPLANT
TAPE CLOTH SURG 4X10 WHT LF (GAUZE/BANDAGES/DRESSINGS) ×2 IMPLANT
TOWEL OR 17X24 6PK STRL BLUE (TOWEL DISPOSABLE) ×6 IMPLANT
TOWEL OR 17X26 10 PK STRL BLUE (TOWEL DISPOSABLE) ×6 IMPLANT
TRAY FOLEY IC TEMP SENS 16FR (CATHETERS) ×3 IMPLANT
TUBE FEEDING 8FR 16IN STR KANG (MISCELLANEOUS) ×3 IMPLANT
TUBING INSUFFLATION 10FT LAP (TUBING) ×3 IMPLANT
UNDERPAD 30X30 INCONTINENT (UNDERPADS AND DIAPERS) ×3 IMPLANT
VRC MALLEABLE SINGLE STG 36FR (MISCELLANEOUS) ×3
WATER STERILE IRR 1000ML POUR (IV SOLUTION) ×6 IMPLANT

## 2013-11-11 NOTE — Transfer of Care (Signed)
Immediate Anesthesia Transfer of Care Note  Patient: Emily Daniel  Procedure(s) Performed: Procedure(s) with comments: MITRAL VALVE Ring repair (MVR)/CORONARY ARTERY BYPASS GRAFTING (CABG) times three using left internal mammary and left sphenous vein. (N/A) - Coronary artery bypass graft times three using left internal mammary artery and left saphenous leg vein using endoscope.  Exploration of right leg.   Patient Location: SICU  Anesthesia Type:General  Level of Consciousness: Patient remains intubated per anesthesia plan  Airway & Oxygen Therapy: Patient remains intubated per anesthesia plan and Patient placed on Ventilator (see vital sign flow sheet for setting)  Post-op Assessment: Report given to PACU RN and Post -op Vital signs reviewed and stable  Post vital signs: Reviewed and stable  Complications: No apparent anesthesia complications

## 2013-11-11 NOTE — Progress Notes (Signed)
Stable overnight Creatinine minimally higher this AM 1.51 up from 1.38 Looking at the overall picture I think it is best to proceed with surgery albeit with a higher risk of perioperative renal failure.   I discussed this with the patient and her family. They are in agreement

## 2013-11-11 NOTE — Anesthesia Postprocedure Evaluation (Signed)
  Anesthesia Post-op Note  Patient: Emily Daniel  Procedure(s) Performed: Procedure(s) with comments: MITRAL VALVE Ring repair (MVR)/CORONARY ARTERY BYPASS GRAFTING (CABG) times three using left internal mammary and left sphenous vein. (N/A) - Coronary artery bypass graft times three using left internal mammary artery and left saphenous leg vein using endoscope.  Exploration of right leg.   Patient Location: ICU  Anesthesia Type:General  Level of Consciousness: sedated  Airway and Oxygen Therapy: Patient remains intubated per anesthesia plan  Post-op Pain: none  Post-op Assessment: Post-op Vital signs reviewed, Patient's Cardiovascular Status Stable, Respiratory Function Stable, Patent Airway and Pain level controlled  Post-op Vital Signs: Reviewed and stable  Last Vitals:  Filed Vitals:   11/11/13 1758  BP:   Pulse: 95  Temp:   Resp: 12    Complications: No apparent anesthesia complications

## 2013-11-11 NOTE — Interval H&P Note (Signed)
History and Physical Interval Note:  11/11/2013 7:50 AM  Emily Daniel  has presented today for surgery, with the diagnosis of CAD, Mitrial Regurgitation  The various methods of treatment have been discussed with the patient and family. After consideration of risks, benefits and other options for treatment, the patient has consented to  Procedure(s): MITRAL VALVE REPLACEMENT (MVR)/CORONARY ARTERY BYPASS GRAFTING (CABG) (N/A) as a surgical intervention .  The patient's history has been reviewed, patient examined, no change in status, stable for surgery.  I have reviewed the patient's chart and labs.  Questions were answered to the patient's satisfaction.     HENDRICKSON,STEVEN C

## 2013-11-11 NOTE — H&P (View-Only) (Signed)
Reason for Consult:Left main and 3 vessel CAD, Mitral regurgitation Referring Physician: Dr. Katheren Puller is an 80 y.o. female.  HPI: 80 yo woman with a remote history of CAD with an MI in 67. She lives alone but has been active. She has a history significant for type II diabetes, hypertension, dyslipidemia, and AVMs of the GI tract.  She says that she has been having pain for the last 4-5 months. She describes this as a sharp, burning pain that starts in her left axilla and radiates to the left elbow. It then radiates across her chest and to the right arm as well. This usually occurs after she has been working in her garden. It is relieved by rest. About 4-5 days PTA she began having exertional shortness of breath. It rapidly progressed to where she could only take a few steps. She noticed orthopnea and peripheral edema as well. On the day of admission she had worsening SOB as well a prolonged episode of pain.  She was admitted and ruled in for a NQWMI. She was in CHF with pulmonary edema. She had an echo which showed an EF of 30-35% with severe MR. This evening she had a cardiac catheterization showing severe left main and 3 vessel CAD withan EF of 30% and MR.  She is currently pain free and denies SOB.  Past Medical History  Diagnosis Date  . Hyperlipidemia   . Hypertension   . Hemorrhoids   . AVM (arteriovenous malformation)   . Diabetes mellitus, type 2   . Anxiety   . PVD (peripheral vascular disease)   . Osteoporosis   . Vitamin B12 deficiency   . Cataract     Past Surgical History  Procedure Laterality Date  . Appendectomy      1945  . Abdominal hysterectomy      1969  . Hemorrhoid surgery      1959    Family History  Problem Relation Age of Onset  . Cancer Mother     cervical   . Cancer Brother     renal ...x3??  [name] Hypertension Other   . Stroke Other     Social History:  reports that she quit smoking about 29 years ago. Her smoking use included  Cigarettes. She has a 36 pack-year smoking history. She has never used smokeless tobacco. She reports that she does not drink alcohol or use illicit drugs.  Allergies:  Allergies  Allergen Reactions  . Meperidine Hcl   . Sulfonamide Derivatives     Medications:  Scheduled: . aspirin EC  81 mg Oral Daily  . carvedilol  3.125 mg Oral BID WC  . furosemide  40 mg Intravenous BID  . insulin aspart  0-9 Units Subcutaneous TID WC  . lisinopril  5 mg Oral Daily  . pantoprazole  40 mg Oral Daily  . potassium chloride  40 mEq Oral Daily  . simvastatin  40 mg Oral Daily    Results for orders placed during the hospital encounter of 11/08/13 (from the past 48 hour(s))  TSH     Status: None   Collection Time    11/08/13  9:25 PM      Result Value Ref Range   TSH 1.310  0.350 - 4.500 uIU/mL  TROPONIN I     Status: Abnormal   Collection Time    11/08/13  9:25 PM      Result Value Ref Range   Troponin I 4.86 (*) <0.30 ng/mL  Comment:            Due to the release kinetics of cTnI,     a negative result within the first hours     of the onset of symptoms does not rule out     myocardial infarction with certainty.     If myocardial infarction is still suspected,     repeat the test at appropriate intervals.     CRITICAL VALUE NOTED.  VALUE IS CONSISTENT WITH PREVIOUSLY REPORTED AND CALLED VALUE.  MAGNESIUM     Status: None   Collection Time    11/08/13  9:25 PM      Result Value Ref Range   Magnesium 1.9  1.5 - 2.5 mg/dL  POTASSIUM     Status: None   Collection Time    11/08/13  9:25 PM      Result Value Ref Range   Potassium 3.9  3.7 - 5.3 mEq/L  GLUCOSE, CAPILLARY     Status: Abnormal   Collection Time    11/08/13  9:55 PM      Result Value Ref Range   Glucose-Capillary 136 (*) 70 - 99 mg/dL  TROPONIN I     Status: Abnormal   Collection Time    11/09/13  3:10 AM      Result Value Ref Range   Troponin I 3.78 (*) <0.30 ng/mL   Comment:            Due to the release kinetics  of cTnI,     a negative result within the first hours     of the onset of symptoms does not rule out     myocardial infarction with certainty.     If myocardial infarction is still suspected,     repeat the test at appropriate intervals.     CRITICAL VALUE NOTED.  VALUE IS CONSISTENT WITH PREVIOUSLY REPORTED AND CALLED VALUE.  CBC     Status: Abnormal   Collection Time    11/09/13  3:10 AM      Result Value Ref Range   WBC 8.5  4.0 - 10.5 K/uL   RBC 2.80 (*) 3.87 - 5.11 MIL/uL   Hemoglobin 8.3 (*) 12.0 - 15.0 g/dL   HCT 26.4 (*) 36.0 - 46.0 %   MCV 94.3  78.0 - 100.0 fL   MCH 29.6  26.0 - 34.0 pg   MCHC 31.4  30.0 - 36.0 g/dL   RDW 13.2  11.5 - 15.5 %   Platelets 227  150 - 400 K/uL  VITAMIN B12     Status: None   Collection Time    11/09/13  3:10 AM      Result Value Ref Range   Vitamin B-12 833  211 - 911 pg/mL   Comment: Performed at [facility]     Status: Abnormal   Collection Time    11/09/13  3:10 AM      Result Value Ref Range   Sodium 143  137 - 147 mEq/L   Potassium 3.9  3.7 - 5.3 mEq/L   Chloride 102  96 - 112 mEq/L   CO2 23  19 - 32 mEq/L   Glucose, Bld 162 (*) 70 - 99 mg/dL   BUN 20  6 - 23 mg/dL   Creatinine, Ser 1.09  0.50 - 1.10 mg/dL   Calcium 9.6  8.4 - 10.5 mg/dL   GFR calc non Af Amer 46 (*) >90 mL/min  GFR calc Af Amer 54 (*) >90 mL/min   Comment: (NOTE)     The eGFR has been calculated using the CKD EPI equation.     This calculation has not been validated in all clinical situations.     eGFR's persistently <90 mL/min signify possible Chronic Kidney     Disease.  MAGNESIUM     Status: None   Collection Time    11/09/13  3:10 AM      Result Value Ref Range   Magnesium 2.0  1.5 - 2.5 mg/dL  HEPARIN LEVEL (UNFRACTIONATED)     Status: None   Collection Time    11/09/13  7:40 AM      Result Value Ref Range   Heparin Unfractionated 0.36  0.30 - 0.70 IU/mL   Comment:            IF HEPARIN RESULTS ARE BELOW      EXPECTED VALUES, AND PATIENT     DOSAGE HAS BEEN CONFIRMED,     SUGGEST FOLLOW UP TESTING     OF ANTITHROMBIN III LEVELS.  CBC     Status: Abnormal   Collection Time    11/09/13  7:40 AM      Result Value Ref Range   WBC 7.5  4.0 - 10.5 K/uL   RBC 2.68 (*) 3.87 - 5.11 MIL/uL   Hemoglobin 7.9 (*) 12.0 - 15.0 g/dL   HCT 25.4 (*) 36.0 - 46.0 %   MCV 94.8  78.0 - 100.0 fL   MCH 29.5  26.0 - 34.0 pg   MCHC 31.1  30.0 - 36.0 g/dL   RDW 13.4  11.5 - 15.5 %   Platelets 215  150 - 400 K/uL  GLUCOSE, CAPILLARY     Status: Abnormal   Collection Time    11/09/13  8:06 AM      Result Value Ref Range   Glucose-Capillary 133 (*) 70 - 99 mg/dL  GLUCOSE, CAPILLARY     Status: Abnormal   Collection Time    11/09/13 11:35 AM      Result Value Ref Range   Glucose-Capillary 114 (*) 70 - 99 mg/dL  OCCULT BLOOD X 1 CARD TO LAB, STOOL     Status: Abnormal   Collection Time    11/09/13  1:34 PM      Result Value Ref Range   Fecal Occult Bld POSITIVE (*) NEGATIVE  PREPARE RBC (CROSSMATCH)     Status: None   Collection Time    11/09/13  3:26 PM      Result Value Ref Range   Order Confirmation ORDER PROCESSED BY BLOOD BANK    PROTIME-INR     Status: None   Collection Time    11/09/13  3:50 PM      Result Value Ref Range   Prothrombin Time 14.3  11.6 - 15.2 seconds   INR 1.13  0.00 - 1.49  TYPE AND SCREEN     Status: None   Collection Time    11/09/13  4:20 PM      Result Value Ref Range   ABO/RH(D) O POS     Antibody Screen NEG     Sample Expiration 11/12/2013     Unit Number [id number]     Blood Component Type RBC LR PHER2     Unit division 00     Status of Unit ISSUED,FINAL     Transfusion Status OK TO TRANSFUSE     Crossmatch Result Compatible  Unit Number [id number]     Blood Component Type RBC LR PHER1     Unit division 00     Status of Unit ISSUED,FINAL     Transfusion Status OK TO TRANSFUSE     Crossmatch Result Compatible     Unit Number [id number]     Blood  Component Type RBC LR PHER2     Unit division 00     Status of Unit REL FROM [facility]     Transfusion Status OK TO TRANSFUSE     Crossmatch Result Compatible    ABO/RH     Status: None   Collection Time    11/09/13  4:20 PM      Result Value Ref Range   ABO/RH(D) O POS    GLUCOSE, CAPILLARY     Status: Abnormal   Collection Time    11/09/13  4:43 PM      Result Value Ref Range   Glucose-Capillary 102 (*) 70 - 99 mg/dL  GLUCOSE, CAPILLARY     Status: Abnormal   Collection Time    11/09/13  9:34 PM      Result Value Ref Range   Glucose-Capillary 110 (*) 70 - 99 mg/dL  BASIC METABOLIC PANEL     Status: Abnormal   Collection Time    11/10/13  3:10 AM      Result Value Ref Range   Sodium 141  137 - 147 mEq/L   Potassium 4.4  3.7 - 5.3 mEq/L   Chloride 102  96 - 112 mEq/L   CO2 22  19 - 32 mEq/L   Glucose, Bld 137 (*) 70 - 99 mg/dL   BUN 33 (*) 6 - 23 mg/dL   Comment: DELTA CHECK NOTED   Creatinine, Ser 1.38 (*) 0.50 - 1.10 mg/dL   Calcium 9.5  8.4 - 10.5 mg/dL   GFR calc non Af Amer 35 (*) >90 mL/min   GFR calc Af Amer 40 (*) >90 mL/min   Comment: (NOTE)     The eGFR has been calculated using the CKD EPI equation.     This calculation has not been validated in all clinical situations.     eGFR's persistently <90 mL/min signify possible Chronic Kidney     Disease.  HEPARIN LEVEL (UNFRACTIONATED)     Status: None   Collection Time    11/10/13  3:10 AM      Result Value Ref Range   Heparin Unfractionated 0.33  0.30 - 0.70 IU/mL   Comment:            IF HEPARIN RESULTS ARE BELOW     EXPECTED VALUES, AND PATIENT     DOSAGE HAS BEEN CONFIRMED,     SUGGEST FOLLOW UP TESTING     OF ANTITHROMBIN III LEVELS.  CBC     Status: Abnormal   Collection Time    11/10/13  3:10 AM      Result Value Ref Range   WBC 8.1  4.0 - 10.5 K/uL   RBC 3.75 (*) 3.87 - 5.11 MIL/uL   Hemoglobin 10.8 (*) 12.0 - 15.0 g/dL   Comment: POST TRANSFUSION SPECIMEN   HCT 33.7 (*) 36.0 - 46.0 %   MCV 89.9   78.0 - 100.0 fL   MCH 28.8  26.0 - 34.0 pg   MCHC 32.0  30.0 - 36.0 g/dL   RDW 16.1 (*) 11.5 - 15.5 %   Platelets 205  150 - 400 K/uL  GLUCOSE, CAPILLARY  Status: Abnormal   Collection Time    11/10/13  8:06 AM      Result Value Ref Range   Glucose-Capillary 143 (*) 70 - 99 mg/dL  CBC     Status: Abnormal   Collection Time    11/10/13 10:55 AM      Result Value Ref Range   WBC 6.8  4.0 - 10.5 K/uL   RBC 3.69 (*) 3.87 - 5.11 MIL/uL   Hemoglobin 10.6 (*) 12.0 - 15.0 g/dL   HCT 32.9 (*) 36.0 - 46.0 %   MCV 89.2  78.0 - 100.0 fL   MCH 28.7  26.0 - 34.0 pg   MCHC 32.2  30.0 - 36.0 g/dL   RDW 16.1 (*) 11.5 - 15.5 %   Platelets 207  150 - 400 K/uL  GLUCOSE, CAPILLARY     Status: Abnormal   Collection Time    11/10/13 12:18 PM      Result Value Ref Range   Glucose-Capillary 121 (*) 70 - 99 mg/dL  POCT I-STAT 3, ART BLOOD GAS (G3+)     Status: Abnormal   Collection Time    11/10/13  5:26 PM      Result Value Ref Range   pH, Arterial 7.418  7.350 - 7.450   pCO2 arterial 35.2  35.0 - 45.0 mmHg   pO2, Arterial 64.0 (*) 80.0 - 100.0 mmHg   Bicarbonate 22.7  20.0 - 24.0 mEq/L   TCO2 24  0 - 100 mmol/L   O2 Saturation 93.0     Acid-base deficit 1.0  0.0 - 2.0 mmol/L   Sample type ARTERIAL    POCT I-STAT 3, VENOUS BLOOD GAS (G3P V)     Status: Abnormal   Collection Time    11/10/13  5:44 PM      Result Value Ref Range   pH, Ven 7.393 (*) 7.250 - 7.300   pCO2, Ven 38.0 (*) 45.0 - 50.0 mmHg   pO2, Ven 35.0  30.0 - 45.0 mmHg   Bicarbonate 23.1  20.0 - 24.0 mEq/L   TCO2 24  0 - 100 mmol/L   O2 Saturation 66.0     Acid-base deficit 2.0  0.0 - 2.0 mmol/L   Sample type VENOUS     Comment NOTIFIED PHYSICIAN    POCT ACTIVATED CLOTTING TIME     Status: None   Collection Time    11/10/13  6:00 PM      Result Value Ref Range   Activated Clotting Time 105      No results found.  Review of Systems  Constitutional: Positive for malaise/fatigue. Negative for fever and chills.   HENT: Positive for hearing loss.   Respiratory: Positive for cough and shortness of breath.   Cardiovascular: Positive for chest pain, orthopnea and leg swelling.  Gastrointestinal: Positive for blood in stool. Negative for nausea, vomiting and melena.  Musculoskeletal: Positive for back pain.  Neurological: Negative for dizziness.  All other systems reviewed and are negative.  Blood pressure 128/69, pulse 96, temperature 98.2 F (36.8 C), temperature source Oral, resp. rate 20, height 5' (1.524 m), weight 126 lb (57.153 kg), SpO2 96.00%. Physical Exam  Vitals reviewed. Constitutional: She is oriented to person, place, and time. No distress.  elderly  HENT:  Head: Normocephalic and atraumatic.  Poor dentition, only 4 teeth left  Eyes: EOM are normal. Pupils are equal, round, and reactive to light.  Neck: Neck supple. No thyromegaly present.  No carotid bruits  Cardiovascular: Normal  rate and regular rhythm.   Murmur heard. 2+ PT on left, faint on R  Respiratory: Effort normal. She has no rales (bibasilar).  GI: Soft. There is no tenderness.  Musculoskeletal: She exhibits edema (trace).  Lymphadenopathy:    She has no cervical adenopathy.  Neurological: She is alert and oriented to person, place, and time. No cranial nerve deficit.  Limited exam, in bed after cath, no apparent motor deficit  Skin: Skin is warm and dry.   CARDIAC CATHETERIZATION HEMODYNAMICS:  RA: A wave 12 V wave 9, mean 8  RV: 35/10  PA: 35/16  PC: A wave 16 V wave 26, mean 17  LV: 98/18  AO: 98/45  Oxygen saturation in the aorta 93% and the pulmonary artery 66 %  Cardiac output: 2.7 l/min (Thermo); 5.2 (Fick)  Cardiac index: 1.7 l/m/m2 3.4  ANGIOGRAPHY:  Fluoroscopy revealed severe coronary calcification  Left main: Calcified vessel with 90% eccentric distal stenosis prior to bifurcating into the LAD and left circumflex vessel.  LAD: 30% proximal stenosis with mild 20% mid LAD narrowing after diagonal  vessel.  Left circumflex: 99% ostial stenosis followed by 80% eccentric calcified proximal stenosis before the first marginal branch.  Right coronary artery: 99% mid subtotal occlusion with evidence for bridging collaterals. Regarding marginal branch. There are also faint left to right collaterals to the distal RCA  Left ventriculography dilated left ventricle with an ejection fraction of 30 to less than 35%. There is at least 3-4+ angiographic mitral regurgitation.  Selective angiography in the right iliac artery suggested total proximal iliac occlusion. Distal aortography was performed, which revealed probable 50% right renal artery stenosis. There was total occlusion of the right iliac at its origin from the aorta. There was distal recanalization. There was 70% ostial left iliac stenosis  IMPRESSION:  Moderately severe ischemic cardiomyopathy with an ejection fraction of approximately 30% with also 3-4+ angiographic mitral regurgitation.  Severe coronary calcification with 90% distal left main stenosis, 30% proximal LAD stenosis, 99% ostial left circumflex stenosis, followed by 80% calcified eccentric proximal circumflex stenosis and subtotal 99% mid RCA 6 stenosis with evidence for right to right bridging collaterals as well as faint left to right distal collaterals.  Significant peripheral vascular disease with probable 50% right renal stenosis and total occlusion of the right common iliac artery at its origin from the aorta with 70% ostial left common iliac artery stenosis and evidence for distal recanalization of the right iliofemoral region.  Mild pulmonary hypertension with PA systolic pressure 35 mm.     ECHOCARDIOGRAM Study Conclusions  - Left ventricle: Systolic function was moderately to severely reduced. The estimated ejection fraction was in the range of 30% to 35%. Diffuse hypokinesis. - Mitral valve: There was severe regurgitation. - Left atrium: The atrium was mildly dilated. -  Pulmonary arteries: PA peak pressure: 48 mm Hg (S).   Assessment/Plan: 80 yo woman with multiple CRF who presents with an OOH MI complicated by acute systolic heart failure. She has mt least moderate and probably severe MR. She has critical left main disease and a totally occluded RCA. She does have reasonable target vessels.  CABG is indicated for survival benefit and relief of symptoms. In all likelihood she will need a mitral repair as well.  I discussed the general nature of the procedure, the need for general anesthesia, and the incisions to be used with the patient and her family. We discussed the expected hospital stay, overall recovery and short and long term outcomes.  I reviewed the indications, risks, benefits and alternatives. They understand the risks include, but are not limited to death, stroke, MI, DVT/PE, bleeding, possible need for transfusion, infections, and other organ system dysfunction including respiratory, renal, or GI complications.   She accepts the risks and agrees to proceed.  We will tentatively plan to proceed with CABG, possible mitral repair tomorrow- late morning Her creatinine was elevated this AM at 1.38. We will recheck her creatinine in the morning- if stable or improved we will proceed, if renal function is worsening we will have to delay surgery   HENDRICKSON,STEVEN C 11/10/2013, 8:08 PM

## 2013-11-11 NOTE — Progress Notes (Signed)
[facility] F/U Note  Emily MilchMary R Daniel XBJ:[id number] DOB: 1929-09-18 DOA: 11/08/2013 PCP: Loreen FreudYvonne Lowne, DO  Brief narrative: 80 yo female with 2 days of SOB and chest pain. Patient endorsed 1-2 weeks of LE swelling.  In the ER, patient was found to be in respiratory distress and was placed on Bipap. She was admitted by Cardiology for a NSTEMI and acute CHF (no prior history). She was given lasix with improvement in the ER and [name]   Hospitalist Service was consulted for DM and anemia.  Patient had colonoscopy in Feb 2014 with AVMs and hemorrhoids found. No further work up or bleeding since.   HPI/Subjective: Alert -ready to proceed with surgical intervention as described below-denies chest pain or shortness of breath currently  Assessment/Plan:  DIABETES MELLITUS, TYPE II, UNCONTROLLED -CBGs well controlled at this time - follow closely -Hgb A1c 5.9  Iron deficiency anemia -took iron briefly but stopped due to nausea -suspect this has contributed to progressive anemia since no recent bleeding reported by the patient -IV iron x 2 doses now -will need to resume iron replacement upon dc- consider polysaccharide formulation since better tolerated from a GI standpoint- also rec q HS dosing -TSH was normal -Fecal occult blood positive without frank bleeding while on IV heparin -Most recent outpatient colonoscopy from 2015 reviewed-stable AVMs and hemorrhoids and per gastroenterologist recommendation on that report due to patient's advanced age she will not need another screening colonoscopy -transfusion in setting of presumed CAD w/ Hgb right at/just below threshold of 8.0 was reasonable-hemoglobin now up to 10.6 -pt will simply need serial monitoring of her Hgb if she requires ongoing use of blood thinners  -Anticipate patient will likely have a degree of acute blood loss related to surgical procedure that will require  transfusion  HYPERTENSION -per Cardiology  HYPERLIPIDEMIA -per Cardiology   Chest pain/NSTEMI -cardiac cath revealed multivessel CAD with CABG recommended -Patient undergo surgical procedure today 11/11/2013  Acute heart failure ([place]) -EF low 30-35% with diffuse hypokinesis-cardiac catheterization consistent with moderately severe ischemic cardiomyopathy noting ventricular block with the with an EF of about 30% -In addition she was found to have 3-4+ mitral regurgitation and plans are to proceed with valve replacement today as well -per Cards and cardiothoracic surgery  DVT prophylaxis: IV heparin Code Status: Full Family Communication: Son at bedside Disposition Plan/Expected LOS: At discretion of primary team-at this time no indications for internal medicine to continue following the patient-please contact us once the patient is out of the ICU in the event she has future needs regarding her anemia or diabetes  Procedures: Cardiac catheterization  11/10/2013 results as noted above  Antibiotics: None  Objective: Blood pressure 116/52, pulse 88, temperature 98.3 F (36.8 C), temperature source Oral, resp. rate 27, height 5' (1.524 m), weight 127 lb 10.3 oz (57.9 kg), SpO2 97.00%.  Intake/Output Summary (Last 24 hours) at 11/11/13 1053 Last data filed at 11/11/13 0900  Gross per 24 hour  Intake 1110.75 ml  Output    600 ml  Net 510.75 ml   Exam: General: No acute respiratory distress-very animated Lungs: Clear to auscultation, RA Cardiovascular: Regular rate and rhythm without murmur gallop or rub normal S1 and S2-BP somewhat soft Abdomen: Nontender, nondistended, soft, bowel sounds positive, no rebound, no ascites, no appreciable mass-no recurrence of bleeding reported Musculoskeletal: No significant cyanosis, clubbing of bilateral lower extremities   Scheduled Meds:  Scheduled Meds: .  aminocaproic acid (AMICAR) for [facility]   Intravenous To OR  . [facility][MAR HOLD] aspirin  EC  81 mg Oral Daily  . bisacodyl  5 mg Oral Once  . [facility][MAR HOLD] carvedilol  3.125 mg Oral BID WC  . cefUROXime (ZINACEF)  IV  1.5 g Intravenous To OR  . cefUROXime (ZINACEF)  IV  750 mg Intravenous To OR  . dexmedetomidine  0.1-0.7 mcg/kg/hr Intravenous To OR  . DOPamine  2-20 mcg/kg/min Intravenous To OR  . epinephrine  0.5-20 mcg/min Intravenous To OR  . [facility][MAR HOLD] furosemide  40 mg Intravenous BID  . heparin-papaverine-plasmalyte irrigation   Irrigation To OR  . heparin 30,000 units/NS 1000 mL solution for CELLSAVER   Other To OR  . [MAR HOLD] insulin aspart  0-9 Units Subcutaneous TID WC  . insulin (NOVOLIN-R) infusion   Intravenous To OR  . [MAR HOLD] lisinopril  5 mg Oral Daily  . magnesium sulfate  40 mEq Other To OR  . nitroGLYCERIN  2-200 mcg/min Intravenous To OR  . [MAR HOLD] pantoprazole  40 mg Oral Daily  . phenylephrine (NEO-SYNEPHRINE) Adult infusion  30-200 mcg/min Intravenous To OR  . [facility][MAR HOLD] potassium chloride  40 mEq Oral Daily  . potassium chloride  80 mEq Other To OR  . [facility][MAR HOLD] simvastatin  40 mg Oral Daily  . vancomycin  1,000 mg Intravenous To OR   Continuous Infusions: . sodium chloride 75 mL/hr at 11/11/13 0600    Data Reviewed: Basic Metabolic Panel:  Recent Labs Lab 11/08/13 1030 11/08/13 2125 11/09/13 0310 11/10/13 0310 11/11/13 0300  NA 145  --  143 141 144  K 4.5 3.9 3.9 4.4 4.7  CL 108  --  102 102 107  CO2 18*  --  23 22 22   GLUCOSE 217*  --  162* 137* 136*  BUN 17  --  20 33* 31*  CREATININE 0.96  --  1.09 1.38* 1.51*  CALCIUM 9.5  --  9.6 9.5 8.9  MG  --  1.9 2.0  --   --    Liver Function Tests:  Recent Labs Lab 11/08/13 1030  AST 34  ALT 29  ALKPHOS 57  BILITOT 0.7  PROT 7.1  ALBUMIN 3.8   CBC:  Recent Labs Lab 11/08/13 1030 11/09/13 0310 11/09/13 0740 11/10/13 0310 11/10/13 1055 11/10/13 2207  WBC 9.4 8.5 7.5 8.1 6.8 9.0  NEUTROABS 7.2  --   --   --   --   --   HGB 8.7* 8.3* 7.9* 10.8* 10.6* 11.2*  HCT  28.4* 26.4* 25.4* 33.7* 32.9* 35.0*  MCV 95.9 94.3 94.8 89.9 89.2 90.2  PLT 252 227 215 205 207 215   Cardiac Enzymes:  Recent Labs Lab 11/08/13 1030 11/08/13 1554 11/08/13 2125 11/09/13 0310  TROPONINI 1.08* 3.53* 4.86* 3.78*   BNP (last 3 results)  Recent Labs  11/08/13 1030  PROBNP 4174.0*   CBG:  Recent Labs Lab 11/09/13 2134 11/10/13 0806 11/10/13 1218 11/10/13 2118 11/11/13 0818  GLUCAP 110* 143* 121* 188* 103*    Recent Results (from the past 240 hour(s))  MRSA PCR SCREENING     Status: None   Collection Time    11/08/13  7:10 PM      Result Value Ref Range Status   MRSA by PCR NEGATIVE  NEGATIVE Final   Comment:            The GeneXpert MRSA Assay (FDA     approved for NASAL  specimens     only), is one component of a     comprehensive MRSA colonization     surveillance program. It is not     intended to diagnose MRSA     infection nor to guide or     monitor treatment for     MRSA infections.     Studies:  Recent x-ray studies have been reviewed in detail by the Attending Physician  Time spent :  35 mins     Junious Silk, ANP [facility] Office  [phone] Pager [phone]   **If unable to reach the above provider after paging please contact the Flow Manager @ [phone]  On-Call/Text Page:      [email or web address]      password [email or web address]  If 7PM-7AM, please contact night-coverage [email or web address] Password [email or web address] 11/11/2013, 10:53 AM   LOS: 3 days   Examining patient and discussed plan with ANP Revonda Standard agree with a/P. Discuss plan with patient and answered all questions Patient with multiple complex medical problems spent greater than 35 minutes in direct patient care

## 2013-11-11 NOTE — Anesthesia Preprocedure Evaluation (Signed)
Anesthesia Evaluation  Patient identified by MRN, date of birth, ID band Patient awake    Reviewed: Allergy & Precautions, H&P , NPO status , Patient's Chart, lab work & pertinent test results  History of Anesthesia Complications Negative for: history of anesthetic complications  Airway Mallampati: II TM Distance: >3 FB Neck ROM: Full    Dental  (+) Missing   Pulmonary neg sleep apnea, neg COPDformer smoker,    + wheezing      Cardiovascular hypertension, Pt. on medications + angina + CAD, + Past MI, + Peripheral Vascular Disease and +CHF - dysrhythmias + Valvular Problems/Murmurs MR Rhythm:Regular  Ef 35%, severe MR, CAD   Neuro/Psych PSYCHIATRIC DISORDERS Anxiety negative neurological ROS     GI/Hepatic negative GI ROS, Neg liver ROS,   Endo/Other  diabetes, Type 2, Oral Hypoglycemic Agents  Renal/GU Renal InsufficiencyRenal disease     Musculoskeletal   Abdominal   Peds  Hematology  (+) anemia ,   Anesthesia Other Findings   Reproductive/Obstetrics                           Anesthesia Physical Anesthesia Plan  ASA: IV  Anesthesia Plan: General   Post-op Pain Management:    Induction: Intravenous  Airway Management Planned: Oral ETT  Additional Equipment: Arterial line, TEE, CVP, 3D TEE, PA Cath and Ultrasound Guidance Line Placement  Intra-op Plan:   Post-operative Plan: Post-operative intubation/ventilation  Informed Consent: I have reviewed the patients History and Physical, chart, labs and discussed the procedure including the risks, benefits and alternatives for the proposed anesthesia with the patient or authorized representative who has indicated his/her understanding and acceptance.   Dental advisory given  Plan Discussed with: CRNA and Surgeon  Anesthesia Plan Comments:         Anesthesia Quick Evaluation

## 2013-11-11 NOTE — Anesthesia Procedure Notes (Addendum)
Procedure Name: Intubation Date/Time: 11/11/2013 11:16 AM Performed by: Reine JustFLOWERS, ROKOSHI T Pre-anesthesia Checklist: Patient identified, Timeout performed, Emergency Drugs available, Suction available and Patient being monitored Patient Re-evaluated:Patient Re-evaluated prior to inductionOxygen Delivery Method: Circle system utilized and Simple face mask Preoxygenation: Pre-oxygenation with 100% oxygen Intubation Type: IV induction Ventilation: Mask ventilation without difficulty and Oral airway inserted - appropriate to patient size Laryngoscope Size: Hyacinth MeekerMiller and 2 Grade View: Grade I Tube type: Oral Tube size: 8.0 mm Number of attempts: 1 Airway Equipment and Method: Patient positioned with wedge pillow and Stylet Placement Confirmation: ETT inserted through vocal cords under direct vision,  positive ETCO2 and breath sounds checked- equal and bilateral Secured at: 22 cm Tube secured with: Tape Dental Injury: Teeth and Oropharynx as per pre-operative assessment

## 2013-11-11 NOTE — Progress Notes (Signed)
[facility] BRIEF SICU PROGRESS NOTE  Day of Surgery  S/P Procedure(s) (LRB): MITRAL VALVE Ring repair (MVR)/CORONARY ARTERY BYPASS GRAFTING (CABG) times three using left internal mammary and left sphenous vein. (N/A)   Just arrived in SICU Sedated on vent AV paced w/ reasonably stable hemodynamics on milrinone, dopamine and Neo drips O2 sats 100% Very little chest tube output so far Labs pending  Plan: Routine early postop  Emily Daniel,Emily Daniel 11/11/2013 5:53 PM

## 2013-11-11 NOTE — Brief Op Note (Addendum)
11/08/2013 - 11/11/2013  3:55 PM  PATIENT:  Emily Daniel  80 y.o. female  PRE-OPERATIVE DIAGNOSIS:  CAD, Mitrial Regurgitation  POST-OPERATIVE DIAGNOSIS:  Coronary artery disease, Mitral valve regurgitation  PROCEDURE:  Procedure(s) with comments:  CORONARY ARTERY BYPASS GRAFTING x 3 -LIMA to LAD -SVG to OM -SVG to DISTAL RIGHT  MITRAL VALVE REPAIR -Ring Annuloplasty utilizing a 30 mm Sorin Memo 3D Ring  CLOSURE OF PATENT FORAMEN OVALE  ENDOSCOPIC SAPHENOUS VEIN HARVEST LEFT LEG -Right leg explored felt to be too small to harvest SURGEON:  Surgeon(s) and Role:    * Loreli SlotSteven C Mylei Brackeen, MD - Primary  PHYSICIAN ASSISTANT: Lowella DandyErin Barrett PA-C  ANESTHESIA:   general  EBL:  Total I/O In: 2376 [P.O.:30; I.V.:2346] Out: 725 [Urine:725]  BLOOD ADMINISTERED:2 units PRBC and CELLSAVER  DRAINS: Left Pleural Chest Tube,  Mediastinal Chest Drain   LOCAL MEDICATIONS USED:  NONE  SPECIMEN:  No Specimen  DISPOSITION OF SPECIMEN:  N/A  COUNTS:  YES  TOURNIQUET:  * No tourniquets in log *  DICTATION: .Dragon Dictation  PLAN OF CARE: Admit to inpatient   PATIENT DISPOSITION:  ICU - intubated and hemodynamically stable.   Delay start of Pharmacological VTE agent (>24hrs) due to surgical blood loss or risk of bleeding: yes   Mitral Valve Etiology  MV Insufficiency: Severe  MV Disease: No.  MV Stenosis: No mitral valve stenosis.  MV Disease Functional Class: MV Disease Functional Class: Type IIIb.  Etiology (Choose at least one and up to five): Ischemic - acute, post infarction.   MV Lesions (Choose at least one): No additional lesions.   Mitral/Tricuspid/Pulmonary Valve Procedure  Mitral Valve Procedure Performed:  Repair: Annuloplasty.  Implant: Annuloplasty Device: Implant model number SMD30, Size 30, Unique Device Identifier [id number].  XC= 138 min CPB= 189 min Off CPB with milrinone 0.3 and dopamine 3 Post bypass TEE- trace MR

## 2013-11-11 NOTE — Progress Notes (Signed)
VASCULAR LAB PRELIMINARY  PRELIMINARY  PRELIMINARY  PRELIMINARY  Pre-op Cardiac Surgery  Carotid Findings:  Right - 1% to 39% ICA stenosis vertebral artery flow is antegrade. Left-  40% to 59% ICA stenosis. Vertebral artery flow is antegrade  Upper Extremity Right Left  Brachial Pressures    Radial Waveforms    Ulnar Waveforms    Palmar Arch (Allen's Test)     Findings:  Omitted per Dr. Dorris FetchHendrickson patient going stat to surgery.    Lower  Extremity Right Left  Dorsalis Pedis    Anterior Tibial    Posterior Tibial    Ankle/Brachial Indices      Findings:  Omitted per Dr. Dorris FetchHendrickson. Patient going stat to surgery   Daniel, Emily, RVS 11/11/2013, 9:11 AM

## 2013-11-11 NOTE — OR Nursing (Signed)
1st call to SICU @ 1645.

## 2013-11-11 NOTE — Interval H&P Note (Signed)
History and Physical Interval Note:  11/11/2013 10:18 AM  Emily Daniel  has presented today for surgery, with the diagnosis of CAD, Mitrial Regurgitation  The various methods of treatment have been discussed with the patient and family. After consideration of risks, benefits and other options for treatment, the patient has consented to  Procedure(s): MITRAL VALVE REPLACEMENT (MVR)/CORONARY ARTERY BYPASS GRAFTING (CABG) (N/A) as a surgical intervention .  The patient's history has been reviewed, patient examined, no change in status, stable for surgery.  I have reviewed the patient's chart and labs.  Questions were answered to the patient's satisfaction.     HENDRICKSON,STEVEN C

## 2013-11-11 NOTE — OR Nursing (Signed)
2nd call to SICU 1713.

## 2013-11-12 ENCOUNTER — Encounter (HOSPITAL_COMMUNITY): Payer: Self-pay | Admitting: Thoracic Surgery (Cardiothoracic Vascular Surgery)

## 2013-11-12 ENCOUNTER — Inpatient Hospital Stay (HOSPITAL_COMMUNITY): Payer: Medicare Other

## 2013-11-12 DIAGNOSIS — Z951 Presence of aortocoronary bypass graft: Secondary | ICD-10-CM

## 2013-11-12 LAB — POCT I-STAT 3, ART BLOOD GAS (G3+)
ACID-BASE DEFICIT: 8 mmol/L — AB (ref 0.0–2.0)
Acid-base deficit: 6 mmol/L — ABNORMAL HIGH (ref 0.0–2.0)
Bicarbonate: 17.5 mEq/L — ABNORMAL LOW (ref 20.0–24.0)
Bicarbonate: 18 mEq/L — ABNORMAL LOW (ref 20.0–24.0)
O2 SAT: 90 %
O2 SAT: 90 %
PH ART: 7.385 (ref 7.350–7.450)
TCO2: 19 mmol/L (ref 0–100)
TCO2: 19 mmol/L (ref 0–100)
pCO2 arterial: 33.1 mmHg — ABNORMAL LOW (ref 35.0–45.0)
pCO2 arterial: 30.1 mmHg — ABNORMAL LOW (ref 35.0–45.0)
pH, Arterial: 7.332 — ABNORMAL LOW (ref 7.350–7.450)
pO2, Arterial: 62 mmHg — ABNORMAL LOW (ref 80.0–100.0)
pO2, Arterial: 59 mmHg — ABNORMAL LOW (ref 80.0–100.0)

## 2013-11-12 LAB — CBC
HCT: 29.9 % — ABNORMAL LOW (ref 36.0–46.0)
HCT: 29.2 % — ABNORMAL LOW (ref 36.0–46.0)
Hemoglobin: 9.7 g/dL — ABNORMAL LOW (ref 12.0–15.0)
Hemoglobin: 9.7 g/dL — ABNORMAL LOW (ref 12.0–15.0)
MCH: 28.4 pg (ref 26.0–34.0)
MCH: 29.6 pg (ref 26.0–34.0)
MCHC: 32.4 g/dL (ref 30.0–36.0)
MCHC: 33.2 g/dL (ref 30.0–36.0)
MCV: 87.7 fL (ref 78.0–100.0)
MCV: 89 fL (ref 78.0–100.0)
PLATELETS: 146 10*3/uL — AB (ref 150–400)
Platelets: 124 10*3/uL — ABNORMAL LOW (ref 150–400)
RBC: 3.41 MIL/uL — AB (ref 3.87–5.11)
RBC: 3.28 MIL/uL — ABNORMAL LOW (ref 3.87–5.11)
RDW: 16 % — ABNORMAL HIGH (ref 11.5–15.5)
RDW: 16 % — AB (ref 11.5–15.5)
WBC: 14.5 10*3/uL — ABNORMAL HIGH (ref 4.0–10.5)
WBC: 14.8 10*3/uL — AB (ref 4.0–10.5)

## 2013-11-12 LAB — CREATININE, SERUM
CREATININE: 0.97 mg/dL (ref 0.50–1.10)
Creatinine, Ser: 1.27 mg/dL — ABNORMAL HIGH (ref 0.50–1.10)
GFR calc Af Amer: 62 mL/min — ABNORMAL LOW (ref >90)
GFR calc non Af Amer: 53 mL/min — ABNORMAL LOW (ref >90)
GFR calc non Af Amer: 38 mL/min — ABNORMAL LOW (ref >90)
GFR, EST AFRICAN AMERICAN: 45 mL/min — AB (ref >90)

## 2013-11-12 LAB — BASIC METABOLIC PANEL
BUN: 22 mg/dL (ref 6–23)
CALCIUM: 7.6 mg/dL — AB (ref 8.4–10.5)
CO2: 18 mEq/L — ABNORMAL LOW (ref 19–32)
Chloride: 108 mEq/L (ref 96–112)
Creatinine, Ser: 1.06 mg/dL (ref 0.50–1.10)
GFR calc Af Amer: 56 mL/min — ABNORMAL LOW (ref >90)
GFR calc non Af Amer: 48 mL/min — ABNORMAL LOW (ref >90)
Glucose, Bld: 105 mg/dL — ABNORMAL HIGH (ref 70–99)
Potassium: 3.9 mEq/L (ref 3.7–5.3)
SODIUM: 141 meq/L (ref 137–147)

## 2013-11-12 LAB — POCT I-STAT, CHEM 8
BUN: 20 mg/dL (ref 6–23)
CALCIUM ION: 1.18 mmol/L (ref 1.13–1.30)
CREATININE: 1.3 mg/dL — AB (ref 0.50–1.10)
Chloride: 112 mEq/L (ref 96–112)
GLUCOSE: 135 mg/dL — AB (ref 70–99)
HCT: 28 % — ABNORMAL LOW (ref 36.0–46.0)
Hemoglobin: 9.5 g/dL — ABNORMAL LOW (ref 12.0–15.0)
POTASSIUM: 4.3 meq/L (ref 3.7–5.3)
Sodium: 142 mEq/L (ref 137–147)
TCO2: 15 mmol/L (ref 0–100)

## 2013-11-12 LAB — GLUCOSE, CAPILLARY
GLUCOSE-CAPILLARY: 189 mg/dL — AB (ref 70–99)
Glucose-Capillary: 142 mg/dL — ABNORMAL HIGH (ref 70–99)
Glucose-Capillary: 165 mg/dL — ABNORMAL HIGH (ref 70–99)
Glucose-Capillary: 166 mg/dL — ABNORMAL HIGH (ref 70–99)
Glucose-Capillary: 107 mg/dL — ABNORMAL HIGH (ref 70–99)
Glucose-Capillary: 124 mg/dL — ABNORMAL HIGH (ref 70–99)

## 2013-11-12 LAB — MAGNESIUM
MAGNESIUM: 3.4 mg/dL — AB (ref 1.5–2.5)
Magnesium: 3.1 mg/dL — ABNORMAL HIGH (ref 1.5–2.5)
Magnesium: 3 mg/dL — ABNORMAL HIGH (ref 1.5–2.5)

## 2013-11-12 LAB — PREPARE PLATELET PHERESIS: Unit division: 0

## 2013-11-12 MED ORDER — FUROSEMIDE 10 MG/ML IJ SOLN
40.0000 mg | Freq: Once | INTRAMUSCULAR | Status: AC
  Administered 2013-11-12: 40 mg via INTRAVENOUS
  Filled 2013-11-12: qty 4

## 2013-11-12 MED ORDER — FUROSEMIDE 10 MG/ML IJ SOLN
40.0000 mg | Freq: Once | INTRAMUSCULAR | Status: AC
  Administered 2013-11-12: 40 mg via INTRAVENOUS

## 2013-11-12 MED ORDER — ALBUMIN HUMAN 5 % IV SOLN
12.5000 g | Freq: Once | INTRAVENOUS | Status: AC
  Administered 2013-11-12: 12.5 g via INTRAVENOUS
  Filled 2013-11-12: qty 250

## 2013-11-12 MED ORDER — POTASSIUM CHLORIDE 10 MEQ/50ML IV SOLN
10.0000 meq | INTRAVENOUS | Status: AC
  Administered 2013-11-12 (×3): 10 meq via INTRAVENOUS

## 2013-11-12 MED ORDER — CHLORHEXIDINE GLUCONATE 0.12 % MT SOLN
OROMUCOSAL | Status: AC
  Administered 2013-11-12: 15 mL
  Filled 2013-11-12: qty 15

## 2013-11-12 MED ORDER — CHLORHEXIDINE GLUCONATE 0.12 % MT SOLN
15.0000 mL | Freq: Two times a day (BID) | OROMUCOSAL | Status: DC
  Administered 2013-11-12 – 2013-11-15 (×8): 15 mL via OROMUCOSAL
  Filled 2013-11-12 (×5): qty 15

## 2013-11-12 MED ORDER — INSULIN ASPART 100 UNIT/ML ~~LOC~~ SOLN
0.0000 [IU] | SUBCUTANEOUS | Status: DC
  Administered 2013-11-12 – 2013-11-13 (×2): 2 [IU] via SUBCUTANEOUS
  Administered 2013-11-13: 4 [IU] via SUBCUTANEOUS
  Administered 2013-11-13 – 2013-11-14 (×4): 2 [IU] via SUBCUTANEOUS
  Administered 2013-11-14 (×2): 4 [IU] via SUBCUTANEOUS
  Administered 2013-11-14 – 2013-11-17 (×9): 2 [IU] via SUBCUTANEOUS
  Administered 2013-11-17: 4 [IU] via SUBCUTANEOUS
  Administered 2013-11-17: 2 [IU] via SUBCUTANEOUS
  Administered 2013-11-18: 8 [IU] via SUBCUTANEOUS

## 2013-11-12 MED ORDER — BIOTENE DRY MOUTH MT LIQD
15.0000 mL | Freq: Two times a day (BID) | OROMUCOSAL | Status: DC
  Administered 2013-11-12 – 2013-11-15 (×7): 15 mL via OROMUCOSAL

## 2013-11-12 MED FILL — Electrolyte-R (PH 7.4) Solution: INTRAVENOUS | Qty: 4000 | Status: AC

## 2013-11-12 MED FILL — Heparin Sodium (Porcine) Inj 1000 Unit/ML: INTRAMUSCULAR | Qty: 30 | Status: AC

## 2013-11-12 MED FILL — Heparin Sodium (Porcine) Inj 1000 Unit/ML: INTRAMUSCULAR | Qty: 10 | Status: AC

## 2013-11-12 MED FILL — Sodium Bicarbonate IV Soln 8.4%: INTRAVENOUS | Qty: 50 | Status: AC

## 2013-11-12 MED FILL — Potassium Chloride Inj 2 mEq/ML: INTRAVENOUS | Qty: 40 | Status: AC

## 2013-11-12 MED FILL — Dexmedetomidine HCl IV Soln 200 MCG/2ML: INTRAVENOUS | Qty: 2 | Status: AC

## 2013-11-12 MED FILL — Lidocaine HCl IV Inj 20 MG/ML: INTRAVENOUS | Qty: 5 | Status: AC

## 2013-11-12 MED FILL — Sodium Chloride IV Soln 0.9%: INTRAVENOUS | Qty: 3000 | Status: AC

## 2013-11-12 MED FILL — Mannitol IV Soln 20%: INTRAVENOUS | Qty: 500 | Status: AC

## 2013-11-12 MED FILL — Magnesium Sulfate Inj 50%: INTRAMUSCULAR | Qty: 10 | Status: AC

## 2013-11-12 NOTE — Progress Notes (Signed)
Spoke with MD Cornelius Moraswen regarding 20 min post-CPAP ABG results. Orders received for respiratory recruitment procedure,  increase the PEEP to 8, keep FiO2 at 40% and to leave intubated for now and have ready to extubate at 8am. Will implement orders and continue to monitor.  Emily Daniel, Emily Daniel

## 2013-11-12 NOTE — Progress Notes (Signed)
Recruitment manuever Performed,Peep increased to 8.0 and Fio2 to 40% per MD order.

## 2013-11-12 NOTE — Progress Notes (Signed)
Patient ID: Emily MilchMary R Mitton, female   DOB: 12-11-1929, 80 y.o.   MRN: [id number]  SICU Evening Rounds:  Hemodynamically stable on low dose dopamine  Remains on vent. Could not be weaned today due to agitation.  Urine output good  CBC    Component Value Date/Time   WBC 14.8* 11/12/2013 1655   RBC 3.28* 11/12/2013 1655   HGB 9.5* 11/12/2013 1819   HCT 28.0* 11/12/2013 1819   PLT 124* 11/12/2013 1655   MCV 89.0 11/12/2013 1655   MCH 29.6 11/12/2013 1655   MCHC 33.2 11/12/2013 1655   RDW 16.0* 11/12/2013 1655   LYMPHSABS 1.4 11/08/2013 1030   MONOABS 0.8 11/08/2013 1030   EOSABS 0.0 11/08/2013 1030   BASOSABS 0.0 11/08/2013 1030    BMET    Component Value Date/Time   NA 142 11/12/2013 1819   K 4.3 11/12/2013 1819   CL 112 11/12/2013 1819   CO2 18* 11/12/2013 0410   GLUCOSE 135* 11/12/2013 1819   BUN 20 11/12/2013 1819   CREATININE 1.30* 11/12/2013 1819   CALCIUM 7.6* 11/12/2013 0410   GFRNONAA 38* 11/12/2013 1655   GFRAA 45* 11/12/2013 1655

## 2013-11-12 NOTE — Progress Notes (Signed)
1 Day Post-Op Procedure(s) (LRB): MITRAL VALVE Ring repair (MVR)/CORONARY ARTERY BYPASS GRAFTING (CABG) times three using left internal mammary and left sphenous vein. (N/A) Subjective: Intubated but awake and alert Objective: Vital signs in last 24 hours: Temp:  [96.3 F (35.7 C)-98.8 F (37.1 C)] 98.6 F (37 C) (06/19 0645) Pulse Rate:  [77-96] 90 (06/19 0700) Cardiac Rhythm:  [-] Atrial paced (06/19 0700) Resp:  [8-32] 13 (06/19 0700) BP: (82-116)/(42-59) 104/49 mmHg (06/19 0700) SpO2:  [90 %-99 %] 94 % (06/19 0700) Arterial Line BP: (89-133)/(45-59) 133/55 mmHg (06/19 0700) FiO2 (%):  [40 %-70 %] 40 % (06/19 0700) Weight:  [145 lb 9.6 oz (66.044 kg)] 145 lb 9.6 oz (66.044 kg) (06/19 0500)  Hemodynamic parameters for last 24 hours: PAP: (26-45)/(18-30) 37/23 mmHg CO:  [2.5 L/min-3.2 L/min] 3.1 L/min CI:  [1.7 L/min/m2-2.1 L/min/m2] 2 L/min/m2  Intake/Output from previous day: 06/18 0701 - 06/19 0700 In: 6315.5 [P.O.:30; I.V.:4393.5; Blood:742; IV Piggyback:1150] Out: 2850 [Urine:1540; Blood:950; Chest Tube:360] Intake/Output this shift:    General appearance: alert and no distress Neurologic: no focal motor deficit Heart: regular rate and rhythm Lungs: clear to auscultation bilaterally Abdomen: normal findings: soft, non-tender Extremities: probable emboli to toes bilaterally, some purple discoloration, moves all toes, sensation intact  Lab Results:  Recent Labs  11/11/13 2255 11/11/13 2300 11/12/13 0410  WBC 14.9*  --  14.5*  HGB 9.9* 9.5* 9.7*  HCT 29.7* 28.0* 29.9*  PLT 132*  --  146*   BMET:  Recent Labs  11/11/13 0300  11/11/13 2300 11/12/13 0410  NA 144  < > 140 141  K 4.7  < > 4.6 3.9  CL 107  --  117* 108  CO2 22  --   --  18*  GLUCOSE 136*  < > 120* 105*  BUN 31*  --  20 22  CREATININE 1.51*  < > 1.00 1.06  CALCIUM 8.9  --   --  7.6*  < > = values in this interval not displayed.  PT/INR:  Recent Labs  11/11/13 1807  LABPROT 21.8*  INR  1.97*   ABG    Component Value Date/Time   PHART 7.332* 11/12/2013 0057   HCO3 17.5* 11/12/2013 0057   TCO2 19 11/12/2013 0057   ACIDBASEDEF 8.0* 11/12/2013 0057   O2SAT 90.0 11/12/2013 0057   CBG (last 3)   Recent Labs  11/12/13 0054 11/12/13 0204 11/12/13 0258  GLUCAP 165* 166* 142*    Assessment/Plan: S/P Procedure(s) (LRB): MITRAL VALVE Ring repair (MVR)/CORONARY ARTERY BYPASS GRAFTING (CABG) times three using left internal mammary and left sphenous vein. (N/A) POD # 1 CV- in SR, atrial paced for rate  Good index- wean milrinone this AM  Continue dopamine for another 24 hours  RESP- sats low overnight- will attempt to wean vent this AM  RENAL- creatinine normal- expect to rise over next 24-48 hours  Volume overloaded- diurese  ENDO- CBG well controlled with insulin gtt  DVT prophylaxis- SCD- will wait another 24 hours before starting lovenox  Suspect emboli to toes was secondary cath. Also could be showering from cross clamp- but no other evidence of emboli    LOS: 4 days    HENDRICKSON,STEVEN C 11/12/2013

## 2013-11-12 NOTE — Plan of Care (Signed)
Problem: Phase II - Intermediate Post-Op Goal: Maintain Hemodynamic Stability Outcome: Progressing Weaned off milrinone. Dopamine still at 3 mcg/kg

## 2013-11-12 NOTE — Progress Notes (Signed)
INITIAL NUTRITION ASSESSMENT  DOCUMENTATION CODES Per approved criteria  -Not Applicable   INTERVENTION:  If TF started, recommend Vital AF 1.2 formula -- initiate at 20 ml/hr and increase by 10 ml every 4 hours to goal rate of 40 ml/hr with Prostat liquid protein 30 ml BID to provide 1352 kcals, 102 gm protein, 778 ml of free water RD to follow for nutrition care plan  NUTRITION DIAGNOSIS: Inadequate oral intake related to inability to eat as evidenced by NPO status  Goal: Initiation of nutrition support in next 24-48 hours of ICU admission if prolonged intubation expected  Monitor:  TF initiation & tolerance, respiratory status, weight, labs, I/O's  Reason for Assessment: VDRF  80 y.o. female  Admitting Dx: coronary artery disease, mitral valve regurgitation  ASSESSMENT: 80 y.o. 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  Patient s/p procedures 6/18: CORONARY ARTERY BYPASS GRAFTING x 3 MITRAL VALVE REPAIR CLOSURE OF PATENT FORAMEN OVALE ENDOSCOPIC SAPHENOUS VEIN HARVEST LEFT LEG  Patient is currently intubated on ventilator support -- OGT in place MV: 8.9 L/min Temp (24hrs), Avg:98.1 F (36.7 C), Min:96.3 F (35.7 C), Max:99.3 F (37.4 C)   Height: Ht Readings from Last 1 Encounters:  11/11/13 5' (1.524 m)    Weight: Wt Readings from Last 1 Encounters:  11/12/13 145 lb 9.6 oz (66.044 kg)    Ideal Body Weight: 100 lb  % Ideal Body Weight: 145%  Wt Readings from Last 10 Encounters:  11/12/13 145 lb 9.6 oz (66.044 kg)  11/12/13 145 lb 9.6 oz (66.044 kg)  11/12/13 145 lb 9.6 oz (66.044 kg)  10/05/13 132 lb 9.6 oz (60.147 kg)  09/30/13 132 lb (59.875 kg)  04/01/13 135 lb 6.4 oz (61.417 kg)  10/02/12 130 lb (58.968 kg)  07/21/12 133 lb (60.328 kg)  06/10/12 133 lb  12.8 oz (60.691 kg)  03/31/12 133 lb 6.4 oz (60.51 kg)    Usual Body Weight: 132 llb  % Usual Body Weight: 110%  BMI:  Body mass index is 28.44 kg/(m^2).  Estimated Nutritional Needs: Kcal: 1300-1450 Protein: 100-110 gm Fluid: per MD  Skin: Intact  Diet Order: NPO  EDUCATION NEEDS: -No education needs identified at this time   Intake/Output Summary (Last 24 hours) at 11/12/13 1408 Last data filed at 11/12/13 1400  Gross per 24 hour  Intake 6574.93 ml  Output   3165 ml  Net 3409.93 ml    Labs:   Recent Labs Lab 11/09/13 0310 11/10/13 0310 11/11/13 0300  11/11/13 1809 11/11/13 2255 11/11/13 2300 11/12/13 0410  NA 143 141 144  < > 143  --  140 141  K 3.9 4.4 4.7  < > 4.0  --  4.6 3.9  CL 102 102 107  --   --   --  117* 108  CO2 23 22 22   --   --   --   --  18*  BUN 20 33* 31*  --   --   --  20 22  CREATININE 1.09 1.38* 1.51*  --   --  0.97 1.00 1.06  CALCIUM 9.6 9.5 8.9  --   --   --   --  7.6*  MG 2.0  --   --   --   --  3.1*  --  3.4*  GLUCOSE 162* 137* 136*  < > 119*  --  120* 105*  < > = values in this interval not displayed.  CBG (last 3)   Recent Labs  11/12/13 0054 11/12/13 0204 11/12/13 0258  GLUCAP 165* 166* 142*    Scheduled Meds: . acetaminophen  1,000 mg Oral 4 times per day   Or  . acetaminophen (TYLENOL) oral liquid 160 mg/5 mL  1,000 mg Per Tube 4 times per day  . antiseptic oral rinse  15 mL Mouth Rinse q12n4p  . aspirin  324 mg Per Tube Daily  . bisacodyl  10 mg Oral Daily   Or  . bisacodyl  10 mg Rectal Daily  . cefUROXime (ZINACEF)  IV  1.5 g Intravenous Q12H  . chlorhexidine  15 mL Mouth Rinse BID  . docusate sodium  200 mg Oral Daily  . insulin regular  0-10 Units Intravenous TID WC  . metoprolol tartrate  12.5 mg Oral BID   Or  . metoprolol tartrate  12.5 mg Per Tube BID  . [START ON 11/13/2013] pantoprazole  40 mg Oral Daily  . simvastatin  40 mg Oral Daily  . sodium chloride  3 mL Intravenous Q12H    Continuous  Infusions: . sodium chloride 20 mL/hr at 11/12/13 0700  . sodium chloride 20 mL/hr at 11/11/13 1845  . sodium chloride 250 mL (11/12/13 1200)  . dexmedetomidine 0.4 mcg/kg/hr (11/12/13 1313)  . DOPamine 3 mcg/kg/min (11/12/13 0700)  . insulin (NOVOLIN-R) infusion 0.7 Units/hr (11/12/13 1310)  . lactated ringers 20 mL/hr at 11/12/13 0700  . milrinone Stopped (11/12/13 1200)  . nitroGLYCERIN Stopped (11/11/13 1925)  . phenylephrine (NEO-SYNEPHRINE) Adult infusion 10 mcg/min (11/12/13 1202)    Past Medical History  Diagnosis Date  . Hyperlipidemia   . Hypertension   . Hemorrhoids   . AVM (arteriovenous malformation)   . Diabetes mellitus, type 2   . Anxiety   . PVD (peripheral vascular disease)   . Osteoporosis   . Vitamin B12 deficiency   . Cataract     Past Surgical History  Procedure Laterality Date  . Appendectomy      1945  . Abdominal hysterectomy      1969  . Hemorrhoid surgery      1959  . Mitral valve replacement (mvr)/coronary artery bypass grafting (cabg) N/A 11/11/2013    Procedure: MITRAL VALVE Ring repair (MVR)/CORONARY ARTERY BYPASS GRAFTING (CABG) times three using left internal mammary and left sphenous vein.;  Surgeon: Loreli SlotSteven C Hendrickson, MD;  Location: [facility] OR;  Service: Open Heart Surgery;  Laterality: N/A;  Coronary artery bypass graft times three using left internal mammary artery and left saphenous leg vein using endoscope.  Exploration of right leg.     Maureen ChattersKatie Haile Toppins, RD, LDN Pager #: [phone] After-Hours Pager #: [phone]

## 2013-11-12 NOTE — Progress Notes (Signed)
Patient ID: Emily Daniel, female   DOB: Jan 06, 1930, 80 y.o.   MRN: [id number]    Subjective:  Intubated and alert   Objective:  Filed Vitals:   11/12/13 0700 11/12/13 0730 11/12/13 0745 11/12/13 0747  BP: 104/49   104/49  Pulse: 90 90  90  Temp:  98.4 F (36.9 C)  98.4 F (36.9 C)  TempSrc:   Core (Comment)   Resp: 13 11  19   Height:      Weight:      SpO2: 94% 93%  92%    Intake/Output from previous day:  Intake/Output Summary (Last 24 hours) at 11/12/13 [id number] Last data filed at 11/12/13 0750  Gross per 24 hour  Intake 6345.49 ml  Output   2880 ml  Net 3465.49 ml    Physical Exam: Intubated and alert  HEENT: normal Neck supple with no adenopathy JVP IJ catheter  Lungs clear with no wheezing  anteriroly chest tube Heart:  S1/S2 no murmur, no rub, gallop or click PMI normal Abdomen: benighn, BS positve, no tenderness, no AAA no bruit.  No HSM or HJR Distal pulses intact with no bruits No edema Neuro non-focal Sternotomy   Lab Results: Basic Metabolic Panel:  Recent Labs  84/69/6206/18/15 0300  11/11/13 2255 11/11/13 2300 11/12/13 0410  NA 144  < >  --  140 141  K 4.7  < >  --  4.6 3.9  CL 107  --   --  117* 108  CO2 22  --   --   --  18*  GLUCOSE 136*  < >  --  120* 105*  BUN 31*  --   --  20 22  CREATININE 1.51*  --  0.97 1.00 1.06  CALCIUM 8.9  --   --   --  7.6*  MG  --   --  3.1*  --  3.4*  < > = values in this interval not displayed. CBC:  Recent Labs  11/11/13 2255 11/11/13 2300 11/12/13 0410  WBC 14.9*  --  14.5*  HGB 9.9* 9.5* 9.7*  HCT 29.7* 28.0* 29.9*  MCV 88.7  --  87.7  PLT 132*  --  146*   Hemoglobin A1C:  Recent Labs  11/10/13 2207  HGBA1C 6.1*    Imaging: Dg Chest Portable 1 View  11/11/2013   CLINICAL DATA:  Postop  EXAM: PORTABLE CHEST - 1 VIEW  COMPARISON:  11/08/2013  FINDINGS: Interval median sternotomy and CABG procedure. Swan-Ganz catheter is identified. The distal portion is looped within the main pulmonary  artery. ET tube tip is above the carina. Mediastinal drain and bilateral chest tubes are in place. Heart size appears normal. There are small bilateral pleural effusions and interstitial edema consistent with CHF.  IMPRESSION: 1. Status post CABG procedure. 2. Mild CHF. 3. Satisfactory position of support apparatus without evidence for pneumothorax.   Electronically Signed   By: Signa Kellaylor  Stroud M.D.   On: 11/11/2013 18:34    Cardiac Studies:  ECG:  SR nonspecific lateral T wave changes    Telemetry:  A pacing underlying rhythm sinus 70's  Echo:   Medications:   . acetaminophen  1,000 mg Oral 4 times per day   Or  . acetaminophen (TYLENOL) oral liquid 160 mg/5 mL  1,000 mg Per Tube 4 times per day  . aspirin EC  325 mg Oral Daily   Or  . aspirin  324 mg Per Tube Daily  . bisacodyl  10 mg  Oral Daily   Or  . bisacodyl  10 mg Rectal Daily  . cefUROXime (ZINACEF)  IV  1.5 g Intravenous Q12H  . docusate sodium  200 mg Oral Daily  . famotidine (PEPCID) IV  20 mg Intravenous Q12H  . furosemide  40 mg Intravenous Once  . insulin regular  0-10 Units Intravenous TID WC  . metoCLOPramide (REGLAN) injection  10 mg Intravenous 4 times per day  . metoprolol tartrate  12.5 mg Oral BID   Or  . metoprolol tartrate  12.5 mg Per Tube BID  . [START ON 11/13/2013] pantoprazole  40 mg Oral Daily  . potassium chloride  10 mEq Intravenous Q1 Hr x 3  . simvastatin  40 mg Oral Daily  . sodium chloride  3 mL Intravenous Q12H     . sodium chloride 20 mL/hr at 11/12/13 0700  . sodium chloride 20 mL/hr at 11/11/13 1845  . sodium chloride    . dexmedetomidine Stopped (11/12/13 0600)  . DOPamine 3 mcg/kg/min (11/12/13 0700)  . insulin (NOVOLIN-R) infusion 1.6 Units/hr (11/12/13 0700)  . lactated ringers 20 mL/hr at 11/12/13 0700  . milrinone 0.2 mcg/kg/min (11/12/13 0745)  . nitroGLYCERIN Stopped (11/11/13 1925)  . phenylephrine (NEO-SYNEPHRINE) Adult infusion 20 mcg/min (11/12/13 0727)     Assessment/Plan:  CABG:  Post CABG doing well alert and neuro intact  Wean milrinone For her age doing surprisingly well  Post annuloplasty Ring as well for severe MR  Will need post op echo at some time  Emily Daniel 11/12/2013, 8:12 AM

## 2013-11-13 ENCOUNTER — Inpatient Hospital Stay (HOSPITAL_COMMUNITY): Payer: Medicare Other

## 2013-11-13 LAB — TYPE AND SCREEN
ABO/RH(D): O POS
Antibody Screen: NEGATIVE
UNIT DIVISION: 0
UNIT DIVISION: 0
Unit division: 0
Unit division: 0
Unit division: 0
Unit division: 0
Unit division: 0
Unit division: 0
Unit division: 0

## 2013-11-13 LAB — GLUCOSE, CAPILLARY
GLUCOSE-CAPILLARY: 89 mg/dL (ref 70–99)
GLUCOSE-CAPILLARY: 114 mg/dL — AB (ref 70–99)
GLUCOSE-CAPILLARY: 112 mg/dL — AB (ref 70–99)
GLUCOSE-CAPILLARY: 117 mg/dL — AB (ref 70–99)
GLUCOSE-CAPILLARY: 150 mg/dL — AB (ref 70–99)
Glucose-Capillary: 103 mg/dL — ABNORMAL HIGH (ref 70–99)
Glucose-Capillary: 107 mg/dL — ABNORMAL HIGH (ref 70–99)
Glucose-Capillary: 108 mg/dL — ABNORMAL HIGH (ref 70–99)
Glucose-Capillary: 108 mg/dL — ABNORMAL HIGH (ref 70–99)
Glucose-Capillary: 132 mg/dL — ABNORMAL HIGH (ref 70–99)
Glucose-Capillary: 135 mg/dL — ABNORMAL HIGH (ref 70–99)
Glucose-Capillary: 148 mg/dL — ABNORMAL HIGH (ref 70–99)
Glucose-Capillary: 165 mg/dL — ABNORMAL HIGH (ref 70–99)
Glucose-Capillary: 92 mg/dL (ref 70–99)
Glucose-Capillary: 94 mg/dL (ref 70–99)
Glucose-Capillary: 97 mg/dL (ref 70–99)

## 2013-11-13 LAB — CBC
HCT: 29.3 % — ABNORMAL LOW (ref 36.0–46.0)
HEMOGLOBIN: 9.7 g/dL — AB (ref 12.0–15.0)
MCH: 29.3 pg (ref 26.0–34.0)
MCHC: 33.1 g/dL (ref 30.0–36.0)
MCV: 88.5 fL (ref 78.0–100.0)
PLATELETS: 107 10*3/uL — AB (ref 150–400)
RBC: 3.31 MIL/uL — AB (ref 3.87–5.11)
RDW: 15.8 % — ABNORMAL HIGH (ref 11.5–15.5)
WBC: 15 10*3/uL — AB (ref 4.0–10.5)

## 2013-11-13 LAB — BASIC METABOLIC PANEL
BUN: 26 mg/dL — ABNORMAL HIGH (ref 6–23)
CO2: 18 mEq/L — ABNORMAL LOW (ref 19–32)
Calcium: 8.6 mg/dL (ref 8.4–10.5)
Chloride: 107 mEq/L (ref 96–112)
Creatinine, Ser: 1.32 mg/dL — ABNORMAL HIGH (ref 0.50–1.10)
GFR calc Af Amer: 43 mL/min — ABNORMAL LOW (ref >90)
GFR calc non Af Amer: 37 mL/min — ABNORMAL LOW (ref >90)
Glucose, Bld: 162 mg/dL — ABNORMAL HIGH (ref 70–99)
POTASSIUM: 3.9 meq/L (ref 3.7–5.3)
Sodium: 143 mEq/L (ref 137–147)

## 2013-11-13 MED ORDER — POTASSIUM CHLORIDE 10 MEQ/50ML IV SOLN
10.0000 meq | INTRAVENOUS | Status: AC
  Administered 2013-11-13 (×2): 10 meq via INTRAVENOUS
  Filled 2013-11-13 (×2): qty 50

## 2013-11-13 MED ORDER — DOCUSATE SODIUM 50 MG/5ML PO LIQD
200.0000 mg | Freq: Every day | ORAL | Status: DC
  Administered 2013-11-13 – 2013-11-14 (×2): 200 mg via ORAL
  Filled 2013-11-13 (×3): qty 20

## 2013-11-13 MED ORDER — FUROSEMIDE 10 MG/ML IJ SOLN
80.0000 mg | Freq: Once | INTRAMUSCULAR | Status: AC
  Administered 2013-11-13: 80 mg via INTRAVENOUS
  Filled 2013-11-13: qty 8

## 2013-11-13 MED ORDER — PANTOPRAZOLE SODIUM 40 MG PO PACK
40.0000 mg | PACK | Freq: Every day | ORAL | Status: DC
  Administered 2013-11-13 – 2013-11-14 (×2): 40 mg
  Filled 2013-11-13 (×3): qty 20

## 2013-11-13 NOTE — Progress Notes (Signed)
Patient ID: Natale MilchMary R Hemmer, female   DOB: 11-10-1929, 80 y.o.   MRN: [id number]  SICU Evening Rounds:  Hemodynamically stable on dop 5.  CI 1.5 this afternoon.  Remains on vent. She is not ready to wean off vent due to volume excess, altered mental status requiring some sedation.  Diuresed a little after 80 mg lasix.  Preop EF only 25%.

## 2013-11-13 NOTE — Progress Notes (Signed)
2 Days Post-Op Procedure(s) (LRB): MITRAL VALVE Ring repair (MVR)/CORONARY ARTERY BYPASS GRAFTING (CABG) times three using left internal mammary and left sphenous vein. (N/A) Subjective:  Intubated on vent, sedated   Objective: Vital signs in last 24 hours: Temp:  [98.8 F (37.1 C)-100.9 F (38.3 C)] 100.4 F (38 C) (06/20 1115) Pulse Rate:  [74-98] 93 (06/20 1115) Cardiac Rhythm:  [-] Normal sinus rhythm (06/20 1000) Resp:  [10-22] 13 (06/20 1115) BP: (82-138)/(45-68) 108/56 mmHg (06/20 1100) SpO2:  [94 %-100 %] 96 % (06/20 1115) Arterial Line BP: (90-157)/(43-76) 115/58 mmHg (06/20 1115) FiO2 (%):  [40 %-50 %] 40 % (06/20 1005) Weight:  [65.7 kg (144 lb 13.5 oz)] 65.7 kg (144 lb 13.5 oz) (06/20 0628)  Hemodynamic parameters for last 24 hours: PAP: (31-54)/(16-38) 33/20 mmHg CO:  [2 L/min-3.1 L/min] 3.1 L/min CI:  [1.3 L/min/m2-2 L/min/m2] 2 L/min/m2  Intake/Output from previous day: 06/19 0701 - 06/20 0700 In: 2210.5 [I.V.:1630.5; NG/GT:130; IV Piggyback:450] Out: 2805 [Urine:2355; Emesis/NG output:200; Chest Tube:250] Intake/Output this shift: Total I/O In: 274.6 [I.V.:244.6; NG/GT:30] Out: 220 [Urine:80; Emesis/NG output:100; Chest Tube:40]  Heart: regular rate and rhythm, S1, S2 normal, no murmur, click, rub or gallop Lungs: diminished breath sounds LLL Abdomen: soft, non-tender; bowel sounds normal; no masses,  no organomegaly Extremities: edema moderate diffuse Wound: dressings dry  Lab Results:  Recent Labs  11/12/13 1655 11/12/13 1819 11/13/13 0415  WBC 14.8*  --  15.0*  HGB 9.7* 9.5* 9.7*  HCT 29.2* 28.0* 29.3*  PLT 124*  --  107*   BMET:  Recent Labs  11/12/13 0410  11/12/13 1819 11/13/13 0415  NA 141  --  142 143  K 3.9  --  4.3 3.9  CL 108  --  112 107  CO2 18*  --   --  18*  GLUCOSE 105*  --  135* 162*  BUN 22  --  20 26*  CREATININE 1.06  < > 1.30* 1.32*  CALCIUM 7.6*  --   --  8.6  < > = values in this interval not displayed.   PT/INR:  Recent Labs  11/11/13 1807  LABPROT 21.8*  INR 1.97*   ABG    Component Value Date/Time   PHART 7.385 11/12/2013 0914   HCO3 18.0* 11/12/2013 0914   TCO2 15 11/12/2013 1819   ACIDBASEDEF 6.0* 11/12/2013 0914   O2SAT 90.0 11/12/2013 0914   CBG (last 3)   Recent Labs  11/13/13 0018 11/13/13 0415 11/13/13 0814  GLUCAP 135* 148* 132*    Assessment/Plan: S/P Procedure(s) (LRB): MITRAL VALVE Ring repair (MVR)/CORONARY ARTERY BYPASS GRAFTING (CABG) times three using left internal mammary and left sphenous vein. (N/A)  She is hemodynamically stable on dop 5 with CI 2.  Wean vent as tolerated. This has been limited by her mental status with agitation requiring sedation. Volume excess: diurese. Diabetes: glucose under adequate control, Hgb A1c 6.1 preop Will need coumadin for mitral ring but will hold off for now. DC chest tubes   LOS: 5 days    BARTLE,BRYAN K 11/13/2013

## 2013-11-13 NOTE — Op Note (Signed)
NAMRadene Knee:  Daniel, Emily               ACCOUNT NO.:  [id number]  MEDICAL RECORD NO.:  [id number]  LOCATION:  2S12C                        FACILITY:  [facility]  PHYSICIAN:  Salvatore DecentSteven C. Dorris FetchHendrickson, M.D.DATE OF BIRTH:  07-Apr-1930  DATE OF PROCEDURE:  11/11/2013 DATE OF DISCHARGE:                              OPERATIVE REPORT   PREOPERATIVE DIAGNOSES:  Left main and three-vessel coronary artery disease with mitral regurgitation.  POSTOPERATIVE DIAGNOSES:  Left main and three-vessel coronary artery disease, severe mitral regurgitation secondary to annular dilatation, patent foramen ovale.  PROCEDURE:  Median sternotomy, extracorporeal circulation, coronary artery bypass grafting x3 (left internal mammary artery to left anterior descending, saphenous vein graft to obtuse marginal 1, saphenous vein graft to distal right coronary), mitral valve repair with Sorin memo 3D 30 mm annuloplasty ring, serial # [id number], closure of patent foramen ovale.  SURGEON:  Salvatore DecentSteven C. Dorris FetchHendrickson, MD  ASSISTANT:  Lowella DandyErin Barrett, PA-C.  ANESTHESIA:  General.  FINDINGS:  Transesophageal echocardiography revealed severe mitral regurgitation, moderate tricuspid regurgitation, ejection fraction approximately 30%, mammary good quality, vein fair quality, good quality targets.  Mitral valve with posterior annular dilatation.  Normal leaflet mobility.  No prolapse.  Postbypass transesophageal echocardiography revealed a trace residual mitral regurgitation.  CLINICAL NOTE:  [name] is an 80 year old woman who presented after an out of hospital MI complicated by acute systolic congestive heart failure.  She was found to have moderate to severe mitral regurgitation, as well as critical left main disease in the setting of a totally occluded right coronary artery.  She was advised to undergo coronary artery bypass grafting and mitral valve repair if necessary.  The indications, risks, benefits, and alternatives were  discussed in detail with the patient.  She understood the high-risk nature of the procedure given her age and elevated creatinine.  She accepted the risks and agreed to proceed.  OPERATIVE NOTE:  [name] was brought to the preoperative holding area on November 11, 2013.  There Anesthesia placed a Swan-Ganz catheter and an arterial blood pressure monitoring line.  She was taken to the operating room, anesthetized, and intubated.  Intravenous antibiotics were administered.  Transesophageal echocardiography was performed. Please refer to Dr. Roxy CedarMoser's separately dictated note for full details. It did reveal left ventricular dysfunction with an ejection fraction of approximately 30%.  There was severe mitral regurgitation.  There was moderate to severe tricuspid regurgitation.  The chest, abdomen, and legs were prepped and draped in usual sterile fashion.  An incision was made in the medial aspect of the right leg at the level of the knee. The greater saphenous vein was identified.  It was very small and was not harvested.  An incision was made in the medial aspect of the left leg.  The vein there likewise was small but as the circulation was better in the left leg, this vein was harvested and it turned out to be of fair quality.  Simultaneously, with the vein harvest, a median sternotomy was performed and the left internal mammary artery was harvested using standard technique.  The mammary artery was a good quality conduit.  2000 units of heparin was administered during the vessel harvest.  Remainder of the full  heparin dose was given prior to opening the pericardium.  After harvesting the conduit, the pericardium was opened.  The ascending aorta was inspected and cannulated via concentric 2-0 Ethibond pledgeted pursestring sutures.  A 28-French metal-tipped venous cannula was placed via pursestring suture in the superior vena cava.  Cardiopulmonary bypass was commenced, and flows were  maintained per protocol.  A 36- French malleable cannula was placed via pursestring suture in the right atrium and directed into the inferior vena cava.  This was then connected to the bypass circuit.  The coronary arteries were inspected and anastomotic sites were chosen.  The conduits were inspected and cut to length.  A foam pad was placed in the pericardium to protect the left phrenic nerve and insulate the heart.  A retrograde cardioplegia cannula was placed via pursestring suture in the right atrium and directed in the coronary sinus.  An antegrade cardioplegia cannula was placed in the ascending aorta.  The aorta was crossclamped.  The left ventricle was emptied via the aortic root vent.  Cardiac arrest then was achieved with a combination of cold antegrade and retrograde blood cardioplegia and topical iced saline. There was a rapid diastolic arrest and myocardial septal cooling to 10 degrees Celsius.  Additional cardioplegia was administered at the completion of each vein graft as well as the mammary graft and at 20 minute intervals during the valve repair. Carbon dioxide was insufflated into the field.  A reversed saphenous vein graft was placed end-to-side to the distal right coronary.  This was a 1.5-mm target vessel.  A probe did pass into the posterior descending.  The vein was fair quality, target was good quality.  The anastomosis was performed with a running 7-0 Prolene suture.  All anastomoses were probed proximally and distally at their completion to ensure patency.  Cardioplegia was administered down the vein graft.  There was good flow and good hemostasis.  Next, a reversed saphenous vein graft was placed end-to-side to OM 1, and this was likewise a 1.5 mm good quality target vessel, the vein was fair quality.  It was anastomosed end-to-side with a running 7-0 Prolene suture.  Again, there was good flow through the graft with cardioplegia administration.  Next,  the left internal mammary artery was brought through a window in the pericardium.  The distal end was beveled.  It was then anastomosed end-to-side to the distal LAD.  Both of these were 1.5 mm good quality vessels.  The mammary did have excellent flow.  At the completion of the mammary to LAD anastomosis, the bulldog clamp was briefly removed to inspect for hemostasis.  Rapid septal rewarming was noted.  Additional cardioplegia was administered.  Because of the patent foramen ovale that had been noted on the prebypass transesophageal echocardiogram, decision was made to perform a trans septal repair.  Right atriotomy was performed.  The PFO was identified. The atrial septum was opened, incorporated a patent foramen ovale. There was good exposure of the mitral valve.  The leaflets were inspected. There was no prolapse of the leaflets and no ruptured cords. The leaflets both moved freely.  The anterior leaflet sized for a 28 mm Memo 3D annuloplasty ring.  2-0 Ethibond horizontal mattress sutures were placed in the anulus circumferentially.  The commissural sutures then were sized and appeared that a 30-mm ring would be more appropriate and was selected.  The sutures then were placed through the sewing cuff of the annuloplasty ring which was then lowered into place and the  sutures were sequentially tied.  After the ring had been placed, the left ventricle was distended with iced saline, and there was no residual regurgitation.  A vent was left across the mitral valve as the interatrial septum was closed with 2 layers of running 4-0 Prolene sutures.  The left ventricle and left atrium were de-aired.  Rewarming was begun.  The right atrial closure was performed with 2 layers of running 4-0 Prolene suture, and the right atrium was de-aired prior to tying the sutures.  As previously noted, additional cardioplegia was administered at 20 minute intervals during this portion of the  procedure.  The vein grafts then were cut to length.  The proximal vein graft anastomoses were performed to 4.0 mm punch aortotomies with running 6-0 Prolene sutures.  At the completion of the final proximal anastomosis, the patient was placed in Trendelenburg position.  A warm dose of retrograde cardioplegia was administered.  Lidocaine was administered. The left ventricle and aortic root were de-aired and the aortic crossclamp was removed.  The total crossclamp time was 138 minutes.  While rewarming was completed, the proximal and distal anastomoses and atriotomy were inspected for hemostasis.  The superior vena caval cannula was repositioned into the right atrium and the inferior vena cava cannula was removed.  The retrograde cardioplegia cannula was removed.  When the patient had rewarmed to a core temperature of 37 degrees Celsius, she weaned from cardiopulmonary bypass on the first attempt.  The total bypass time was 189 minutes.  The patient was on milrinone at 0.3 mcg/kg/ minute which had been started on induction and also was on dopamine at 3 mcg/kg/minute which had been started at the initiation of cardiopulmonary bypass.  She was DDD paced at the time of separation from bypass.  The initial cardiac index was greater than 2 L/min/m2.  Postbypass transesophageal echocardiography revealed no change in left ventricular wall motion.  There was trace residual mitral regurgitation.  The tricuspid regurgitation was improved compared to prebypass.  A test dose of protamine was administered.  The superior vena caval cannula and aortic cannulae were removed.  The remainder of the protamine was administered without incident.  The chest was irrigated with warm saline.  Hemostasis was achieved.  The pericardium was not closed.  Left pleural and mediastinal chest tubes were placed through separate subcostal incisions.  The sternum was closed with a combination of single and double heavy  gauge stainless steel wires.  The pectoralis fascia, subcutaneous tissue, and skin were closed in standard fashion. All sponge, needle, and instrument counts were correct at the end of the procedure.  The patient was taken from the operating room to the surgical intensive care unit in good condition.     Salvatore DecentSteven C. Dorris FetchHendrickson, M.D.     SCH/MEDQ  D:  11/12/2013  T:  11/13/2013  Job:  [id number]

## 2013-11-14 ENCOUNTER — Inpatient Hospital Stay (HOSPITAL_COMMUNITY): Payer: Medicare Other

## 2013-11-14 LAB — POCT I-STAT 3, ART BLOOD GAS (G3+)
Acid-base deficit: 1 mmol/L (ref 0.0–2.0)
Acid-base deficit: 3 mmol/L — ABNORMAL HIGH (ref 0.0–2.0)
BICARBONATE: 21.8 meq/L (ref 20.0–24.0)
BICARBONATE: 20 meq/L (ref 20.0–24.0)
O2 Saturation: 98 %
O2 Saturation: 96 %
PCO2 ART: 28.9 mmHg — AB (ref 35.0–45.0)
PCO2 ART: 29.2 mmHg — AB (ref 35.0–45.0)
PH ART: 7.486 — AB (ref 7.350–7.450)
PH ART: 7.447 (ref 7.350–7.450)
Patient temperature: 37.5
Patient temperature: 37.6
TCO2: 23 mmol/L (ref 0–100)
TCO2: 21 mmol/L (ref 0–100)
pO2, Arterial: 100 mmHg (ref 80.0–100.0)
pO2, Arterial: 82 mmHg (ref 80.0–100.0)

## 2013-11-14 LAB — GLUCOSE, CAPILLARY
GLUCOSE-CAPILLARY: 107 mg/dL — AB (ref 70–99)
Glucose-Capillary: 115 mg/dL — ABNORMAL HIGH (ref 70–99)
Glucose-Capillary: 117 mg/dL — ABNORMAL HIGH (ref 70–99)
Glucose-Capillary: 124 mg/dL — ABNORMAL HIGH (ref 70–99)
Glucose-Capillary: 133 mg/dL — ABNORMAL HIGH (ref 70–99)
Glucose-Capillary: 155 mg/dL — ABNORMAL HIGH (ref 70–99)
Glucose-Capillary: 163 mg/dL — ABNORMAL HIGH (ref 70–99)

## 2013-11-14 LAB — BASIC METABOLIC PANEL
BUN: 32 mg/dL — ABNORMAL HIGH (ref 6–23)
CALCIUM: 8.7 mg/dL (ref 8.4–10.5)
CO2: 20 meq/L (ref 19–32)
Chloride: 108 mEq/L (ref 96–112)
Creatinine, Ser: 1.18 mg/dL — ABNORMAL HIGH (ref 0.50–1.10)
GFR calc non Af Amer: 42 mL/min — ABNORMAL LOW (ref >90)
GFR, EST AFRICAN AMERICAN: 49 mL/min — AB (ref >90)
Glucose, Bld: 168 mg/dL — ABNORMAL HIGH (ref 70–99)
Potassium: 3.9 mEq/L (ref 3.7–5.3)
Sodium: 144 mEq/L (ref 137–147)

## 2013-11-14 LAB — CBC
HCT: 30.2 % — ABNORMAL LOW (ref 36.0–46.0)
Hemoglobin: 9.7 g/dL — ABNORMAL LOW (ref 12.0–15.0)
MCH: 29.2 pg (ref 26.0–34.0)
MCHC: 32.1 g/dL (ref 30.0–36.0)
MCV: 91 fL (ref 78.0–100.0)
Platelets: 90 10*3/uL — ABNORMAL LOW (ref 150–400)
RBC: 3.32 MIL/uL — AB (ref 3.87–5.11)
RDW: 16.1 % — ABNORMAL HIGH (ref 11.5–15.5)
WBC: 14.6 10*3/uL — ABNORMAL HIGH (ref 4.0–10.5)

## 2013-11-14 MED ORDER — AMIODARONE HCL IN DEXTROSE 360-4.14 MG/200ML-% IV SOLN
60.0000 mg/h | INTRAVENOUS | Status: AC
  Administered 2013-11-14 (×2): 60 mg/h via INTRAVENOUS
  Filled 2013-11-14: qty 200

## 2013-11-14 MED ORDER — AMIODARONE LOAD VIA INFUSION
150.0000 mg | Freq: Once | INTRAVENOUS | Status: AC
  Administered 2013-11-14: 150 mg via INTRAVENOUS
  Filled 2013-11-14: qty 83.34

## 2013-11-14 MED ORDER — POTASSIUM CHLORIDE 10 MEQ/50ML IV SOLN
10.0000 meq | INTRAVENOUS | Status: AC
  Administered 2013-11-14 (×3): 10 meq via INTRAVENOUS
  Filled 2013-11-14 (×3): qty 50

## 2013-11-14 MED ORDER — FUROSEMIDE 10 MG/ML IJ SOLN
80.0000 mg | Freq: Once | INTRAMUSCULAR | Status: AC
  Administered 2013-11-14: 80 mg via INTRAVENOUS
  Filled 2013-11-14: qty 8

## 2013-11-14 MED ORDER — AMIODARONE HCL IN DEXTROSE 360-4.14 MG/200ML-% IV SOLN
INTRAVENOUS | Status: AC
  Filled 2013-11-14: qty 200

## 2013-11-14 MED ORDER — AMIODARONE HCL IN DEXTROSE 360-4.14 MG/200ML-% IV SOLN
30.0000 mg/h | INTRAVENOUS | Status: DC
  Administered 2013-11-15 – 2013-11-17 (×5): 30 mg/h via INTRAVENOUS
  Filled 2013-11-14 (×16): qty 200

## 2013-11-14 NOTE — Progress Notes (Addendum)
Dr Laneta SimmersBartle updated at bedside. MD updated pt on 0.222mcg precedex, pt restless/agitated at times, will follow some commands, MD assessed. MD updated dopamine gtt remains at 5mcg, CI range 1.6-2.2 this shift. Pt on cpap since ~0800, updatedc-pap ABG from 1232. Md ok to extubate. MD at bedside for extubation. Md updated this AM pt in afib with HR 80-110s with burst to 140s, not sustained, pt now SR/ST with PAC. Will continue to monitor. Koren BoundWaggoner, Jennifer

## 2013-11-14 NOTE — Progress Notes (Signed)
3 Days Post-Op Procedure(s) (LRB): MITRAL VALVE Ring repair (MVR)/CORONARY ARTERY BYPASS GRAFTING (CABG) times three using left internal mammary and left sphenous vein. (N/A) Subjective:  Intubated and restless but follows commands  Objective: Vital signs in last 24 hours: Temp:  [99.3 F (37.4 C)-101.1 F (38.4 C)] 99.9 F (37.7 C) (06/21 1100) Pulse Rate:  [31-111] 86 (06/21 1100) Cardiac Rhythm:  [-] Normal sinus rhythm (06/21 0900) Resp:  [10-30] 21 (06/21 1100) BP: (84-140)/(53-91) 116/62 mmHg (06/21 1100) SpO2:  [89 %-100 %] 97 % (06/21 1100) Arterial Line BP: (89-168)/(47-75) 147/63 mmHg (06/21 1100) FiO2 (%):  [40 %] 40 % (06/21 1000) Weight:  [67.7 kg (149 lb 4 oz)] 67.7 kg (149 lb 4 oz) (06/21 0600)  Hemodynamic parameters for last 24 hours: PAP: (31-45)/(15-29) 34/20 mmHg CO:  [2.3 L/min-2.8 L/min] 2.6 L/min CI:  [1.5 L/min/m2-1.8 L/min/m2] 1.7 L/min/m2  Intake/Output from previous day: 06/20 0701 - 06/21 0700 In: 1424.2 [I.V.:1134.2; NG/GT:240; IV Piggyback:50] Out: 2280 [Urine:2110; Emesis/NG output:100; Chest Tube:70] Intake/Output this shift: Total I/O In: 132.3 [I.V.:102.3; NG/GT:30] Out: 185 [Urine:185]  Neurologic: intact Heart: regular rate and rhythm, S1, S2 normal, no murmur, click, rub or gallop Lungs: coarse breath sounds Extremities: edema moderate Wound: incision ok  Lab Results:  Recent Labs  11/13/13 0415 11/14/13 0511  WBC 15.0* 14.6*  HGB 9.7* 9.7*  HCT 29.3* 30.2*  PLT 107* 90*   BMET:  Recent Labs  11/13/13 0415 11/14/13 0511  NA 143 144  K 3.9 3.9  CL 107 108  CO2 18* 20  GLUCOSE 162* 168*  BUN 26* 32*  CREATININE 1.32* 1.18*  CALCIUM 8.6 8.7    PT/INR:  Recent Labs  11/11/13 1807  LABPROT 21.8*  INR 1.97*   ABG    Component Value Date/Time   PHART 7.385 11/12/2013 0914   HCO3 18.0* 11/12/2013 0914   TCO2 15 11/12/2013 1819   ACIDBASEDEF 6.0* 11/12/2013 0914   O2SAT 90.0 11/12/2013 0914   CBG (last 3)    Recent Labs  11/14/13 0402 11/14/13 0748 11/14/13 1217  GLUCAP 163* 133* 115*    Assessment/Plan: S/P Procedure(s) (LRB): MITRAL VALVE Ring repair (MVR)/CORONARY ARTERY BYPASS GRAFTING (CABG) times three using left internal mammary and left sphenous vein. (N/A)  She has been hemodynamically stable on dopamine 5. Preop EF 25%  She has been on CPAP since this am and seems to be breathing well, very strong. CXR looks better today. Will check and ABG and try to extubate.  Continue diuresis.  Expected acute blood loss anemia: stable. Observe.  Thrombocytopenia: stable.   LOS: 6 days    Evelene CroonBARTLE,BRYAN K 11/14/2013

## 2013-11-14 NOTE — Progress Notes (Signed)
Patient ID: Emily Daniel, female   DOB: January 26, 1930, 80 y.o.   MRN: [id number]  SICU Evening rounds  She has been hemodynamically stable today. Dopamine on 3 mcg  Afib with RVR on amio. Now sinus 76.  Urine output ok  Awake and alert.

## 2013-11-14 NOTE — Procedures (Signed)
Extubation Procedure Note  Patient Details:   Name: Emily Daniel DOB: 1929/10/19 MRN: [id number]   Airway Documentation:     Evaluation  O2 sats: stable throughout Complications: No apparent complications Patient did tolerate procedure well. Bilateral Breath Sounds: Clear;Diminished Suctioning: Oral;Airway Yes  Pt tolerated wean, no cuff leak heard but okay to extubate per Dr. Laneta SimmersBartle. Pt extubated to 4lpm [place], no dyspnea or stridor noted after extubation. All vitals are WNL at this time. RT will continue to monitor.   Armando GangMike, Carrie C 11/14/2013, 12:52 PM

## 2013-11-14 NOTE — Progress Notes (Signed)
Dr Laneta SimmersBartle updated pt status. udpated pt received amiodarone IV bolus and is on continuous IV infusion. Updated afib vs SR with PAC, HR 80-100s. Updated most recent CI>1.8, dopamine gtt decreased to 3mcg, will leave at 3mcg, down from 5mcg per MD. Updated bilateral feet/leg cool to touch with toes discolored, pt able to move legs/feet purposefully with reports of numbness at times. Previously able to doppler bilateral pedal pulses and posterior tibial pulses. MD updated currently only able to doppler left posterior tibial pulse and right popliteal pulse. Will continue to monitor. Koren BoundWaggoner, Jennifer

## 2013-11-14 NOTE — Progress Notes (Signed)
Dr. Laneta SimmersBartle at bedside for evening rounds.  Per Dr. Laneta SimmersBartle, OK [facility].

## 2013-11-15 ENCOUNTER — Inpatient Hospital Stay (HOSPITAL_COMMUNITY): Payer: Medicare Other

## 2013-11-15 ENCOUNTER — Encounter: Payer: Self-pay | Admitting: Cardiovascular Disease

## 2013-11-15 DIAGNOSIS — R0989 Other specified symptoms and signs involving the circulatory and respiratory systems: Secondary | ICD-10-CM

## 2013-11-15 DIAGNOSIS — I739 Peripheral vascular disease, unspecified: Secondary | ICD-10-CM

## 2013-11-15 LAB — CBC
HEMATOCRIT: 31.1 % — AB (ref 36.0–46.0)
Hemoglobin: 9.9 g/dL — ABNORMAL LOW (ref 12.0–15.0)
MCH: 28.9 pg (ref 26.0–34.0)
MCHC: 31.8 g/dL (ref 30.0–36.0)
MCV: 90.9 fL (ref 78.0–100.0)
Platelets: 104 10*3/uL — ABNORMAL LOW (ref 150–400)
RBC: 3.42 MIL/uL — ABNORMAL LOW (ref 3.87–5.11)
RDW: 16.3 % — ABNORMAL HIGH (ref 11.5–15.5)
WBC: 14.6 10*3/uL — AB (ref 4.0–10.5)

## 2013-11-15 LAB — GLUCOSE, CAPILLARY
GLUCOSE-CAPILLARY: 100 mg/dL — AB (ref 70–99)
GLUCOSE-CAPILLARY: 117 mg/dL — AB (ref 70–99)
Glucose-Capillary: 153 mg/dL — ABNORMAL HIGH (ref 70–99)
Glucose-Capillary: 140 mg/dL — ABNORMAL HIGH (ref 70–99)

## 2013-11-15 LAB — BASIC METABOLIC PANEL
BUN: 33 mg/dL — AB (ref 6–23)
CHLORIDE: 106 meq/L (ref 96–112)
CO2: 18 mEq/L — ABNORMAL LOW (ref 19–32)
Calcium: 8.9 mg/dL (ref 8.4–10.5)
Creatinine, Ser: 1.06 mg/dL (ref 0.50–1.10)
GFR calc Af Amer: 56 mL/min — ABNORMAL LOW (ref >90)
GFR calc non Af Amer: 48 mL/min — ABNORMAL LOW (ref >90)
Glucose, Bld: 147 mg/dL — ABNORMAL HIGH (ref 70–99)
POTASSIUM: 3.7 meq/L (ref 3.7–5.3)
Sodium: 144 mEq/L (ref 137–147)

## 2013-11-15 MED ORDER — POTASSIUM CHLORIDE 10 MEQ/50ML IV SOLN
10.0000 meq | INTRAVENOUS | Status: AC
  Administered 2013-11-15 (×3): 10 meq via INTRAVENOUS
  Filled 2013-11-15 (×2): qty 50

## 2013-11-15 MED ORDER — DOCUSATE SODIUM 100 MG PO CAPS
200.0000 mg | ORAL_CAPSULE | Freq: Every day | ORAL | Status: DC
  Administered 2013-11-15 – 2013-11-20 (×3): 200 mg via ORAL
  Filled 2013-11-15 (×5): qty 2

## 2013-11-15 MED ORDER — AMIODARONE LOAD VIA INFUSION
150.0000 mg | Freq: Once | INTRAVENOUS | Status: AC
  Administered 2013-11-15: 150 mg via INTRAVENOUS
  Filled 2013-11-15: qty 83.34

## 2013-11-15 MED ORDER — AMIODARONE IV BOLUS ONLY 150 MG/100ML: 150.0000 mg | Freq: Once | INTRAVENOUS | Status: DC

## 2013-11-15 MED ORDER — PANTOPRAZOLE SODIUM 40 MG PO TBEC
40.0000 mg | DELAYED_RELEASE_TABLET | Freq: Every day | ORAL | Status: DC
  Administered 2013-11-15 – 2013-11-19 (×5): 40 mg via ORAL
  Filled 2013-11-15 (×4): qty 1

## 2013-11-15 MED ORDER — POTASSIUM CHLORIDE 10 MEQ/50ML IV SOLN
10.0000 meq | INTRAVENOUS | Status: AC
  Administered 2013-11-15 (×2): 10 meq via INTRAVENOUS
  Filled 2013-11-15 (×2): qty 50

## 2013-11-15 MED ORDER — ASPIRIN 325 MG PO TABS
325.0000 mg | ORAL_TABLET | Freq: Every day | ORAL | Status: DC
  Administered 2013-11-15 – 2013-11-19 (×5): 325 mg via ORAL
  Filled 2013-11-15 (×6): qty 1

## 2013-11-15 MED ORDER — POTASSIUM CHLORIDE 10 MEQ/50ML IV SOLN
INTRAVENOUS | Status: AC
  Filled 2013-11-15: qty 50

## 2013-11-15 MED ORDER — FUROSEMIDE 10 MG/ML IJ SOLN
40.0000 mg | Freq: Once | INTRAMUSCULAR | Status: AC
  Administered 2013-11-15: 40 mg via INTRAVENOUS
  Filled 2013-11-15: qty 4

## 2013-11-15 NOTE — Evaluation (Signed)
Physical Therapy Evaluation Patient Details Name: Emily MilchMary R Daniel MRN: [id number] DOB: Jan 30, 1930 Today's Date: 11/15/2013   History of Present Illness  80 year old female who is s/p mitral valve ring repair and CABGx3 on 6/18. Pt extubated 6/21. PMH - CAD, MI, DM  Clinical Impression  Pt admitted with above. Pt currently with functional limitations due to the deficits listed below (see PT Problem List).  Pt will benefit from skilled PT to increase their independence and safety with mobility to allow discharge to the venue listed below.       Follow Up Recommendations SNF    Equipment Recommendations  Rolling walker with 5" wheels    Recommendations for Other Services       Precautions / Restrictions Precautions Precautions: Sternal;Fall      Mobility  Bed Mobility Overal bed mobility: Needs Assistance Bed Mobility: Supine to Sit;Sit to Supine     Supine to sit: Mod assist;HOB elevated Sit to supine: Min assist   General bed mobility comments: Verbal cues for technique. Assist to bring legs over and elevate trunk to sit. Assist to control descent of trunk and bring legs up into bed.  Transfers Overall transfer level: Needs assistance Equipment used: Rolling walker (2 wheeled) Transfers: Sit to/from Stand Sit to Stand: Min assist         General transfer comment: Verbal cues for hands on knees for sternal precautions. Assist to bring hips up.  Ambulation/Gait Ambulation/Gait assistance: Min assist;+2 safety/equipment Ambulation Distance (Feet): 65 Feet Assistive device: Rolling walker (2 wheeled) Gait Pattern/deviations: Step-through pattern;Decreased step length - right;Decreased step length - left;Trunk flexed Gait velocity: decr Gait velocity interpretation: Below normal speed for age/gender General Gait Details: Verbal cues to stand more erect. Assist to stand more erect.  Stairs            Wheelchair Mobility    Modified Rankin (Stroke Patients  Only)       Balance Overall balance assessment: Needs assistance Sitting-balance support: No upper extremity supported;Bilateral upper extremity supported Sitting balance-Leahy Scale: Poor Sitting balance - Comments: Needed upper extremity support for balance.   Standing balance support: Bilateral upper extremity supported Standing balance-Leahy Scale: Poor Standing balance comment: Stood with walker and min A.                             Pertinent Vitals/Pain VSS    Home Living Family/patient expects to be discharged to:: Private residence Living Arrangements: Alone Available Help at Discharge: Family;Available PRN/intermittently Type of Home: House Home Access: Stairs to enter   Entrance Stairs-Number of Steps: 1 Home Layout: One level Home Equipment: None Additional Comments: Likes to work in her yard.    Prior Function Level of Independence: Independent               Hand Dominance        Extremity/Trunk Assessment   Upper Extremity Assessment: Generalized weakness           Lower Extremity Assessment: Generalized weakness         Communication   Communication: No difficulties  Cognition Arousal/Alertness: Awake/alert Behavior During Therapy: WFL for tasks assessed/performed Overall Cognitive Status: No family/caregiver present to determine baseline cognitive functioning       Memory: Decreased short-term memory;Decreased recall of precautions              General Comments      Exercises  Assessment/Plan    PT Assessment Patient needs continued PT services  PT Diagnosis Difficulty walking;Generalized weakness   PT Problem List Decreased strength;Decreased activity tolerance;Decreased balance;Decreased mobility;Decreased knowledge of use of DME;Decreased safety awareness;Decreased knowledge of precautions  PT Treatment Interventions DME instruction;Gait training;Patient/family education;Functional mobility  training;Therapeutic activities;Therapeutic exercise;Balance training   PT Goals (Current goals can be found in the Care Plan section) Acute Rehab PT Goals Patient Stated Goal: Return home and work in the yard. PT Goal Formulation: With patient Time For Goal Achievement: 11/29/13 Potential to Achieve Goals: Good    Frequency Min 3X/week   Barriers to discharge Decreased caregiver support      Co-evaluation               End of Session Equipment Utilized During Treatment: Gait belt Activity Tolerance: Patient tolerated treatment well Patient left: in bed;with call bell/phone within reach;with bed alarm set;with nursing/sitter in room Nurse Communication: Mobility status         Time: [id number] PT Time Calculation (min): 17 min   Charges:   PT Evaluation $Initial PT Evaluation Tier I: 1 Procedure PT Treatments $Gait Training: 8-22 mins   PT G Codes:          Emily Daniel 11/15/2013, 1:22 PM  Fluor CorporationCary Kashmere Daniel PT [phone]

## 2013-11-15 NOTE — Progress Notes (Addendum)
VASCULAR LAB PRELIMINARY  PRELIMINARY  PRELIMINARY  PRELIMINARY  Bilateral lower extremity arterial duplex completed.    Preliminary report:  Right lower extremity arterial duplex exhibits dampened monophasic flow throughout the common femoral, femoral, popliteal, posterior tibial, peroneal, and anterior tibial arteries.  No stenosis noted. Probable ilio-femoral disease  Left lower extremity arterial duplex exhibits strong biphasic waveforms throughout the common femoral, femoral, popliteal, posterior tibial, peroneal, and anterior tibial arteries.  No stenosis noted.  I was able to Doppler the left AT and PT with the handheld Doppler.  I was able to faintly hear the right DP but right PT was inaudible.   KANADY, CANDACE, RVT 11/15/2013, 3:51 PM

## 2013-11-15 NOTE — Evaluation (Signed)
Clinical/Bedside Swallow Evaluation Patient Details  Name: Emily Daniel MRN: [id number] Date of Birth: 1930-05-10  Today's Date: 11/15/2013 Time: 0915-0929 SLP Time Calculation (min): 14 min  Past Medical History:  Past Medical History  Diagnosis Date  . Hyperlipidemia   . Hypertension   . Hemorrhoids   . AVM (arteriovenous malformation)   . Diabetes mellitus, type 2   . Anxiety   . PVD (peripheral vascular disease)   . Osteoporosis   . Vitamin B12 deficiency   . Cataract    Past Surgical History:  Past Surgical History  Procedure Laterality Date  . Appendectomy      1945  . Abdominal hysterectomy      1969  . Hemorrhoid surgery      1959  . Mitral valve replacement (mvr)/coronary artery bypass grafting (cabg) N/A 11/11/2013    Procedure: MITRAL VALVE Ring repair (MVR)/CORONARY ARTERY BYPASS GRAFTING (CABG) times three using left internal mammary and left sphenous vein.;  Surgeon: Loreli SlotSteven C Hendrickson, MD;  Location: [facility] OR;  Service: Open Heart Surgery;  Laterality: N/A;  Coronary artery bypass graft times three using left internal mammary artery and left saphenous leg vein using endoscope.  Exploration of right leg.    HPI:  Pt is an 80 year old female who is s/p mitral valve ring repair and CABGx3 on 6/18. Pt extubated 6/21.    Assessment / Plan / Recommendation Clinical Impression  No evidence of aspiration, voice only mildly hoarse. Pt may initiate a dys 1 (puree) diet with thin liquids as dentures are not yet at the hospital; pts son will bring dentures and SLP will return to assess with solids then and check for toelrance of liquids. Basic aspiraiton precautions.     Aspiration Risk  Mild    Diet Recommendation Dysphagia 1 (Puree);Thin liquid   Liquid Administration via: Cup;Straw Medication Administration: Whole meds with liquid Supervision: Staff to assist with self feeding Compensations: Slow rate;Small sips/bites Postural Changes and/or Swallow Maneuvers:  Seated upright 90 degrees    Other  Recommendations Oral Care Recommendations: Oral care BID   Follow Up Recommendations  None    Frequency and Duration min 2x/week  1 week   Pertinent Vitals/Pain NA    SLP Swallow Goals     Swallow Study Prior Functional Status       General HPI: Pt is an 80 year old female who is s/p mitral valve ring repair and CABGx3 on 6/18. Pt extubated 6/21.  Type of Study: Bedside swallow evaluation Previous Swallow Assessment: none Diet Prior to this Study: NPO Temperature Spikes Noted: No Respiratory Status: Nasal cannula History of Recent Intubation: Yes Length of Intubations (days): 4 days Date extubated: 11/14/13 Behavior/Cognition: Alert;Cooperative;Pleasant mood Oral Cavity - Dentition: Edentulous;Dentures, not available Self-Feeding Abilities: Able to feed self;Needs assist Patient Positioning: Upright in bed Baseline Vocal Quality: Hoarse Volitional Cough: Strong Volitional Swallow: Able to elicit    Oral/Motor/Sensory Function Overall Oral Motor/Sensory Function: Appears within functional limits for tasks assessed   Ice Chips Ice chips: Within functional limits   Thin Liquid Thin Liquid: Within functional limits    Nectar Thick Nectar Thick Liquid: Not tested   Honey Thick Honey Thick Liquid: Not tested   Puree Puree: Within functional limits   Solid   GO    Solid: Not tested      Emily DittyBonnie DeBlois, MA CCC-SLP [phone]  Emily Daniel, Emily Daniel 11/15/2013,9:44 AM

## 2013-11-15 NOTE — Progress Notes (Signed)
CT surgery p.m. Rounds  Patient remains in rate controlled atrial fibrillation Patient was able to get out of the chair take a short walk today Slowly weaning dopamine for renal function and cardiac output--check mixed venous saturation in a.m. Vascular lab assessment of lower extremities shows probable iliofemoral disease on the right with audible right dorsalis pedis Doppler-continue to monitor Followup platelet count in a.m.

## 2013-11-15 NOTE — Progress Notes (Signed)
Dr Laneta SimmersBartle updated pt back in afib with HR 100-100s. Amiodarone gtt at 30mg /hr, order received to re bolus with IV amio and continue gtt at current rate post bolus. Koren BoundWaggoner, Makhari Dovidio

## 2013-11-15 NOTE — Plan of Care (Signed)
Problem: Phase II - Intermediate Post-Op Goal: Activity Progressed Outcome: Progressing Pt has dangled and stood at side of bed.

## 2013-11-15 NOTE — Progress Notes (Signed)
[facility] DAILY ICU PROGRESS NOTE                   [address]            [place] [id number]          [phone]   4 Days Post-Op Procedure(s) (LRB): MITRAL VALVE Ring repair (MVR)/CORONARY ARTERY BYPASS GRAFTING (CABG) times three using left internal mammary and left sphenous vein. (N/A)  Total Length of Stay:  LOS: 7 days   Subjective: Extubated, mildly confused but alert and talking   Objective: Vital signs in last 24 hours: Temp:  [98.2 F (36.8 C)-100.6 F (38.1 C)] 98.2 F (36.8 C) (06/22 0342) Pulse Rate:  [44-136] 93 (06/22 0700) Cardiac Rhythm:  [-] Normal sinus rhythm (06/22 0400) Resp:  [16-30] 24 (06/22 0700) BP: (83-148)/(38-95) 142/75 mmHg (06/22 0700) SpO2:  [94 %-100 %] 100 % (06/22 0700) Arterial Line BP: (90-194)/(38-74) 174/65 mmHg (06/22 0700) FiO2 (%):  [40 %] 40 % (06/21 1200) Weight:  [142 lb 13.7 oz (64.8 kg)] 142 lb 13.7 oz (64.8 kg) (06/22 0500)  Filed Weights   11/13/13 0628 11/14/13 0600 11/15/13 0500  Weight: 144 lb 13.5 oz (65.7 kg) 149 lb 4 oz (67.7 kg) 142 lb 13.7 oz (64.8 kg)    Weight change: -6 lb 6.3 oz (-2.9 kg)   Hemodynamic parameters for last 24 hours: PAP: (24-43)/(9-24) 26/10 mmHg CO:  [2.5 L/min-3.8 L/min] 2.5 L/min CI:  [1.6 L/min/m2-2.4 L/min/m2] 1.7 L/min/m2  Intake/Output from previous day: 06/21 0701 - 06/22 0700 In: 1659.9 [I.V.:1279.9; NG/GT:180; IV Piggyback:200] Out: 1960 [Urine:1910; Emesis/NG output:50]  Intake/Output this shift:    Current Meds: Scheduled Meds: . acetaminophen  1,000 mg Oral 4 times per day   Or  . acetaminophen (TYLENOL) oral liquid 160 mg/5 mL  1,000 mg Per Tube 4 times per day  . antiseptic oral rinse  15 mL Mouth Rinse q12n4p  . aspirin  324 mg Per Tube Daily  . bisacodyl  10 mg Oral Daily   Or  . bisacodyl  10 mg Rectal Daily  . chlorhexidine  15 mL Mouth Rinse BID  . docusate  200 mg Oral Daily  . furosemide  40 mg Intravenous Once  . insulin aspart  0-24 Units  Subcutaneous 6 times per day  . pantoprazole sodium  40 mg Per Tube Daily  . potassium chloride  10 mEq Intravenous Q1 Hr x 3  . simvastatin  40 mg Oral Daily  . sodium chloride  3 mL Intravenous Q12H   Continuous Infusions: . sodium chloride 10 mL/hr at 11/13/13 0400  . sodium chloride 20 mL/hr at 11/13/13 0400  . sodium chloride 250 mL (11/12/13 1200)  . amiodarone 30 mg/hr (11/14/13 2045)  . dexmedetomidine Stopped (11/14/13 1300)  . DOPamine 3 mcg/kg/min (11/15/13 0600)  . insulin (NOVOLIN-R) infusion Stopped (11/12/13 1500)  . lactated ringers 20 mL/hr at 11/13/13 0400  . nitroGLYCERIN Stopped (11/11/13 1925)  . phenylephrine (NEO-SYNEPHRINE) Adult infusion Stopped (11/13/13 0900)   PRN Meds:.midazolam, morphine injection, ondansetron (ZOFRAN) IV, oxyCODONE, sodium chloride  General appearance: alert, cooperative and no distress Neurologic: intact Heart: regular rate and rhythm, S1, S2 normal, no murmur, click, rub or gallop Lungs: diminished breath sounds bilaterally Abdomen: soft, non-tender; bowel sounds normal; no masses,  no organomegaly Extremities: feet cool, no palpalbe distal pulses, sensation amvovement intact , see photo  Wound: sternum stable     Lab Results: CBC: Recent Labs  11/14/13 0511  11/15/13 0403  WBC 14.6* 14.6*  HGB 9.7* 9.9*  HCT 30.2* 31.1*  PLT 90* 104*   BMET:  Recent Labs  11/14/13 0511 11/15/13 0403  NA 144 144  K 3.9 3.7  CL 108 106  CO2 20 18*  GLUCOSE 168* 147*  BUN 32* 33*  CREATININE 1.18* 1.06  CALCIUM 8.7 8.9    PT/INR: No results found for this basename: LABPROT, INR,  in the last 72 hours Radiology: Dg Chest Port 1 View  11/14/2013   CLINICAL DATA:  CABG and mitral annuloplasty  EXAM: PORTABLE CHEST - 1 VIEW  COMPARISON:  11/13/2013  FINDINGS: Endotracheal tube remains in good position. Swan-Ganz catheter in the main pulmonary artery. Left chest tube has been removed. Pericardial drain remains in place.  Bilateral  pleural effusions are unchanged with bibasilar atelectasis. No pneumothorax.  Slightly improved lung volume compared with yesterday.  IMPRESSION: Slightly improved lung volume. Bibasilar atelectasis and effusion unchanged. No pneumothorax.   Electronically Signed   By: Marlan Palauharles  Clark M.D.   On: 11/14/2013 10:28     Assessment/Plan: S/P Procedure(s) (LRB): MITRAL VALVE Ring repair (MVR)/CORONARY ARTERY BYPASS GRAFTING (CABG) times three using left internal mammary and left sphenous vein. (N/A) Mobilize Diuresis Check lower extremity  doppler, check swallowing, continue cordarone for afib ysterday, now sinus Renal function stable  minimial dose of dopamine Thrombocytopenia-avoiding heparin now   Seddrick Flax B 11/15/2013 7:46 AM

## 2013-11-15 NOTE — Plan of Care (Signed)
Problem: Phase II - Intermediate Post-Op Goal: Wean to Extubate Outcome: Completed/Met Date Met:  11/15/13 Extubated 11/14/13

## 2013-11-16 ENCOUNTER — Inpatient Hospital Stay (HOSPITAL_COMMUNITY): Payer: Medicare Other

## 2013-11-16 LAB — CBC
HCT: 32.1 % — ABNORMAL LOW (ref 36.0–46.0)
Hemoglobin: 10.3 g/dL — ABNORMAL LOW (ref 12.0–15.0)
MCH: 29.2 pg (ref 26.0–34.0)
MCHC: 32.1 g/dL (ref 30.0–36.0)
MCV: 90.9 fL (ref 78.0–100.0)
Platelets: 133 10*3/uL — ABNORMAL LOW (ref 150–400)
RBC: 3.53 MIL/uL — ABNORMAL LOW (ref 3.87–5.11)
RDW: 16.9 % — ABNORMAL HIGH (ref 11.5–15.5)
WBC: 11 10*3/uL — ABNORMAL HIGH (ref 4.0–10.5)

## 2013-11-16 LAB — COMPREHENSIVE METABOLIC PANEL
ALT: 54 U/L — ABNORMAL HIGH (ref 0–35)
AST: 29 U/L (ref 0–37)
Albumin: 2.7 g/dL — ABNORMAL LOW (ref 3.5–5.2)
Alkaline Phosphatase: 68 U/L (ref 39–117)
BUN: 32 mg/dL — ABNORMAL HIGH (ref 6–23)
CO2: 22 mEq/L (ref 19–32)
Calcium: 8.8 mg/dL (ref 8.4–10.5)
Chloride: 108 mEq/L (ref 96–112)
Creatinine, Ser: 0.9 mg/dL (ref 0.50–1.10)
GFR calc Af Amer: 68 mL/min — ABNORMAL LOW (ref >90)
GFR calc non Af Amer: 58 mL/min — ABNORMAL LOW (ref >90)
Glucose, Bld: 146 mg/dL — ABNORMAL HIGH (ref 70–99)
Potassium: 3.7 mEq/L (ref 3.7–5.3)
Sodium: 145 mEq/L (ref 137–147)
Total Bilirubin: 0.9 mg/dL (ref 0.3–1.2)
Total Protein: 5.4 g/dL — ABNORMAL LOW (ref 6.0–8.3)

## 2013-11-16 LAB — GLUCOSE, CAPILLARY
GLUCOSE-CAPILLARY: 127 mg/dL — AB (ref 70–99)
Glucose-Capillary: 106 mg/dL — ABNORMAL HIGH (ref 70–99)
Glucose-Capillary: 112 mg/dL — ABNORMAL HIGH (ref 70–99)
Glucose-Capillary: 119 mg/dL — ABNORMAL HIGH (ref 70–99)
Glucose-Capillary: 130 mg/dL — ABNORMAL HIGH (ref 70–99)
Glucose-Capillary: 140 mg/dL — ABNORMAL HIGH (ref 70–99)
Glucose-Capillary: 146 mg/dL — ABNORMAL HIGH (ref 70–99)

## 2013-11-16 LAB — CARBOXYHEMOGLOBIN
Carboxyhemoglobin: 1.2 % (ref 0.5–1.5)
Methemoglobin: 0.8 % (ref 0.0–1.5)
O2 Saturation: 59.1 %
Total hemoglobin: 10.7 g/dL — ABNORMAL LOW (ref 12.0–16.0)

## 2013-11-16 LAB — PROTIME-INR
INR: 1.3 (ref 0.00–1.49)
Prothrombin Time: 15.9 seconds — ABNORMAL HIGH (ref 11.6–15.2)

## 2013-11-16 MED ORDER — FUROSEMIDE 10 MG/ML IJ SOLN
40.0000 mg | Freq: Two times a day (BID) | INTRAMUSCULAR | Status: DC
  Administered 2013-11-16 – 2013-11-19 (×6): 40 mg via INTRAVENOUS
  Filled 2013-11-16 (×9): qty 4

## 2013-11-16 MED ORDER — POTASSIUM CHLORIDE 10 MEQ/50ML IV SOLN
10.0000 meq | INTRAVENOUS | Status: AC
  Administered 2013-11-16 (×3): 10 meq via INTRAVENOUS
  Filled 2013-11-16: qty 50

## 2013-11-16 MED ORDER — FUROSEMIDE 10 MG/ML IJ SOLN
40.0000 mg | Freq: Once | INTRAMUSCULAR | Status: AC
  Administered 2013-11-16: 40 mg via INTRAVENOUS
  Filled 2013-11-16: qty 4

## 2013-11-16 MED ORDER — POTASSIUM CHLORIDE 10 MEQ/50ML IV SOLN
10.0000 meq | INTRAVENOUS | Status: AC | PRN
  Administered 2013-11-16 (×3): 10 meq via INTRAVENOUS
  Filled 2013-11-16 (×3): qty 50

## 2013-11-16 MED ORDER — ENSURE COMPLETE PO LIQD
237.0000 mL | Freq: Two times a day (BID) | ORAL | Status: DC
  Administered 2013-11-16: 237 mL via ORAL

## 2013-11-16 MED ORDER — METOPROLOL TARTRATE 12.5 MG HALF TABLET
12.5000 mg | ORAL_TABLET | Freq: Two times a day (BID) | ORAL | Status: DC
  Filled 2013-11-16 (×2): qty 1

## 2013-11-16 NOTE — Progress Notes (Signed)
Speech Language Pathology Treatment: Dysphagia  Patient Details Name: ROE KOFFMAN MRN: [id number] DOB: 1929/10/22 Today's Date: 11/16/2013 Time: 1030-1040 SLP Time Calculation (min): 10 min  Assessment / Plan / Recommendation Clinical Impression  Pt now with dentures available, only wears tip dentures. Demonstrates slow but adequate mastication of solids, normal swallow function and no signs of aspiration. Pt tolerated full liquids at breakfast without nausea. Will upgrade diet as MD note states advance as tolerated. No further SLP treatment needed.    HPI HPI: Pt is an 80 year old female who is s/p mitral valve ring repair and CABGx3 on 6/18. Pt extubated 6/21.    Pertinent Vitals NA  SLP Plan  All goals met    Recommendations Diet recommendations: Regular;Thin liquid Liquids provided via: Cup;Straw Medication Administration: Whole meds with liquid Supervision: Staff to assist with self feeding Compensations: Slow rate;Small sips/bites Postural Changes and/or Swallow Maneuvers: Seated upright 90 degrees              Oral Care Recommendations: Oral care BID Follow up Recommendations: None Plan: All goals met    GO    Herbie [place], MA CCC-SLP [phone]  Lynann Beaver 11/16/2013, 11:14 AM

## 2013-11-16 NOTE — Progress Notes (Signed)
TCTS DAILY ICU PROGRESS NOTE                   [address]            [place] [id number]          [phone]   5 Days Post-Op Procedure(s) (LRB): MITRAL VALVE Ring repair (MVR)/CORONARY ARTERY BYPASS GRAFTING (CABG) times three using left internal mammary and left sphenous vein. (N/A)  Total Length of Stay:  LOS: 8 days   Subjective: OOB in chair.  Had some bursts of AF overnight, treated with Amio bolus. No complaints.   Objective: Vital signs in last 24 hours: Temp:  [97.1 F (36.2 C)-98 F (36.7 C)] 97.1 F (36.2 C) (06/23 0400) Pulse Rate:  [70-125] 84 (06/23 0700) Cardiac Rhythm:  [-] Normal sinus rhythm (06/23 0749) Resp:  [15-27] 19 (06/23 0700) BP: (90-151)/(55-110) 124/65 mmHg (06/23 0700) SpO2:  [93 %-100 %] 98 % (06/23 0700) Arterial Line BP: (182)/(68) 182/68 mmHg (06/22 0800) Weight:  [141 lb 6.4 oz (64.139 kg)] 141 lb 6.4 oz (64.139 kg) (06/23 0630)  Filed Weights   11/14/13 0600 11/15/13 0500 11/16/13 0630  Weight: 149 lb 4 oz (67.7 kg) 142 lb 13.7 oz (64.8 kg) 141 lb 6.4 oz (64.139 kg)    Weight change: -1 lb 7.3 oz (-0.661 kg)   CBGs 117-112-130-146     Intake/Output from previous day: 06/22 0701 - 06/23 0700 In: 1390 [P.O.:120; I.V.:1020; IV Piggyback:250] Out: 1635 [Urine:1635]  Intake/Output this shift: Total I/O In: 50 [IV Piggyback:50] Out: -   Current Meds: Scheduled Meds: . acetaminophen  1,000 mg Oral 4 times per day   Or  . acetaminophen (TYLENOL) oral liquid 160 mg/5 mL  1,000 mg Per Tube 4 times per day  . antiseptic oral rinse  15 mL Mouth Rinse q12n4p  . aspirin  325 mg Oral Daily  . bisacodyl  10 mg Oral Daily   Or  . bisacodyl  10 mg Rectal Daily  . chlorhexidine  15 mL Mouth Rinse BID  . docusate sodium  200 mg Oral Daily  . insulin aspart  0-24 Units Subcutaneous 6 times per day  . pantoprazole  40 mg Oral Daily  . simvastatin  40 mg Oral Daily  . sodium chloride  3 mL Intravenous Q12H    Continuous Infusions: . sodium chloride 10 mL/hr at 11/13/13 0400  . sodium chloride 20 mL/hr at 11/16/13 0400  . sodium chloride 250 mL (11/12/13 1200)  . amiodarone 30 mg/hr (11/16/13 0400)  . dexmedetomidine Stopped (11/14/13 1300)  . DOPamine 3 mcg/kg/min (11/16/13 0400)  . lactated ringers 20 mL/hr at 11/13/13 0400  . nitroGLYCERIN Stopped (11/11/13 1925)  . phenylephrine (NEO-SYNEPHRINE) Adult infusion Stopped (11/13/13 0900)   PRN Meds:.midazolam, morphine injection, ondansetron (ZOFRAN) IV, oxyCODONE, potassium chloride, sodium chloride   Physical Exam: General appearance: alert, cooperative and no distress Heart: regular rate and rhythm Lungs: diminished breath sounds bilaterally Extremities: Feet cool, toes with ischemic changes bilaterally, +LE edema Wound: Clean and dry    Lab Results: CBC: Recent Labs  11/15/13 0403 11/16/13 0425  WBC 14.6* 11.0*  HGB 9.9* 10.3*  HCT 31.1* 32.1*  PLT 104* 133*   BMET:  Recent Labs  11/15/13 0403 11/16/13 0425  NA 144 145  K 3.7 3.7  CL 106 108  CO2 18* 22  GLUCOSE 147* 146*  BUN 33* 32*  CREATININE 1.06 0.90  CALCIUM 8.9 8.8    PT/INR:  Recent Labs  11/16/13 0425  LABPROT 15.9*  INR 1.30   Radiology: Dg Chest Port 1 View  11/15/2013   CLINICAL DATA:  Post CABG/mitral valve repair.  EXAM: PORTABLE CHEST - 1 VIEW  COMPARISON:  11/14/2013.  FINDINGS: Endotracheal tube, nasogastric tube, mediastinal drain and right central line of been removed. Introducer catheter remains in place on the right with the tip projecting just above the expected level of the formation of the superior vena cava. No gross pneumothorax.  Small bilateral pleural effusions suspected with atelectatic changes left mid-lower lung zone.  Post median sternotomy, CABG and mitral valve replacement with cardiomegaly.  Mild central pulmonary vascular prominence without pulmonary edema.  Epicardial leads remain in place.  Calcified aorta.  IMPRESSION:  Small bilateral pleural effusions suspected with atelectatic changes left mid-lower lung zone.  Post median sternotomy, CABG and mitral valve replacement with cardiomegaly.   Electronically Signed   By: Emily LarssonSteve  Daniel M.D.   On: 11/15/2013 07:53   Co-Ox= 1.2    Assessment/Plan: S/P Procedure(s) (LRB): MITRAL VALVE Ring repair (MVR)/CORONARY ARTERY BYPASS GRAFTING (CABG) times three using left internal mammary and left saphenous vein. (N/A)  CV- postop AF, now SR. Continue IV Amiodarone for now.  BPs trending up. Start low dose beta blocker. Wean and d/c Dopamine.  Vol overload- continue diuresis, will repeat IV Lasix  Hypokalemia- replace K+ per protocol.  PV- ischemic changes to toes bilaterally.  + Doppler pulses with probable ileofemoral disease by ultrasound.  Continue to monitor closely.  Thrombocytopenia- plts improving.  Expected postop blood loss anemia- H/H stable.  Pulm- continue pulm toilet/IS.  Deconditioning- Still quite weak.  PT as tolerated.  Nutrition- no dysphagia per swallow study.  Started on D1 diet until she gets her dentures.    May need PICC line with continued need for IV access.  DM- sugars stable, continue SSI and watch as diet is advanced.  May be able to resume home meds soon.  Continue Foley for volume measurement/diuresis.   Emily Daniel H 11/16/2013 7:57 AM

## 2013-11-16 NOTE — Progress Notes (Signed)
NUTRITION FOLLOW UP  INTERVENTION: Ensure Complete po BID, each supplement provides 350 kcal and 13 grams of protein RD to follow for nutrition care plan  NUTRITION DIAGNOSIS: Inadequate oral intake now related to limited appetite as evidenced by PO intake 25%, ongoing  New Goal: Pt to meet >/= 90% of their estimated nutrition needs, progressing   Monitor:  PO & supplemental intake, weight, labs, I/O's  ASSESSMENT: 80 y.o. 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  Patient s/p procedures 6/18: CORONARY ARTERY BYPASS GRAFTING x 3 MITRAL VALVE REPAIR CLOSURE OF PATENT FORAMEN OVALE ENDOSCOPIC SAPHENOUS VEIN HARVEST LEFT LEG  Patient extubated 6/21.  S/p bedside swallow evaluation 6/22.  SLP recommending Dys 1, thin liquid diet.  Currently on a Full Liquid diet.  PO intake ~ 25% per flowsheet records.  Would benefit from addition of oral nutrition supplements.  Patient amenable.  RD to order.  Height: Ht Readings from Last 1 Encounters:  11/11/13 5' (1.524 m)    Weight: Wt Readings from Last 1 Encounters:  11/16/13 141 lb 6.4 oz (64.139 kg)    BMI:  Body mass index is 27.62 kg/(m^2).  Re-estimated needs: Kcal: 1500-1700 Protein: 85-95 gm Fluid: per MD  Skin: Intact  Diet Order: Full Liquid   Intake/Output Summary (Last 24 hours) at 11/16/13 0844 Last data filed at 11/16/13 0800  Gross per 24 hour  Intake 1303.3 ml  Output   1605 ml  Net -301.7 ml    Labs:   Recent Labs Lab 11/11/13 2255  11/12/13 0410 11/12/13 1655  11/14/13 0511 11/15/13 0403 11/16/13 0425  NA  --   < > 141  --   < > 144 144 145  K  --   < > 3.9  --   < > 3.9 3.7 3.7  CL  --   < > 108  --   < > 108 106 108  CO2  --   --  18*  --   < > 20 18* 22  BUN  --   < > 22  --   < > 32* 33* 32*   CREATININE 0.97  < > 1.06 1.27*  < > 1.18* 1.06 0.90  CALCIUM  --   --  7.6*  --   < > 8.7 8.9 8.8  MG 3.1*  --  3.4* 3.0*  --   --   --   --   GLUCOSE  --   < > 105*  --   < > 168* 147* 146*  < > = values in this interval not displayed.  CBG (last 3)   Recent Labs  11/15/13 2016 11/16/13 0012 11/16/13 0423  GLUCAP 117* 112* 130*    Scheduled Meds: . acetaminophen  1,000 mg Oral 4 times per day   Or  . acetaminophen (TYLENOL) oral liquid 160 mg/5 mL  1,000 mg Per Tube 4 times per day  . antiseptic oral rinse  15 mL Mouth Rinse q12n4p  . aspirin  325 mg Oral Daily  . bisacodyl  10 mg Oral Daily   Or  . bisacodyl  10 mg Rectal Daily  . chlorhexidine  15 mL Mouth Rinse BID  . docusate sodium  200 mg Oral Daily  . insulin aspart  0-24 Units  Subcutaneous 6 times per day  . metoprolol tartrate  12.5 mg Oral BID  . pantoprazole  40 mg Oral Daily  . simvastatin  40 mg Oral Daily  . sodium chloride  3 mL Intravenous Q12H    Continuous Infusions: . sodium chloride 10 mL/hr at 11/13/13 0400  . sodium chloride 20 mL/hr at 11/16/13 0400  . sodium chloride 250 mL (11/12/13 1200)  . amiodarone 30 mg/hr (11/16/13 0400)  . dexmedetomidine Stopped (11/14/13 1300)  . DOPamine 3 mcg/kg/min (11/16/13 0400)  . lactated ringers 20 mL/hr at 11/13/13 0400  . nitroGLYCERIN Stopped (11/11/13 1925)  . phenylephrine (NEO-SYNEPHRINE) Adult infusion Stopped (11/13/13 0900)    Past Medical History  Diagnosis Date  . Hyperlipidemia   . Hypertension   . Hemorrhoids   . AVM (arteriovenous malformation)   . Diabetes mellitus, type 2   . Anxiety   . PVD (peripheral vascular disease)   . Osteoporosis   . Vitamin B12 deficiency   . Cataract     Past Surgical History  Procedure Laterality Date  . Appendectomy      1945  . Abdominal hysterectomy      1969  . Hemorrhoid surgery      1959  . Mitral valve replacement (mvr)/coronary artery bypass grafting (cabg) N/A 11/11/2013    Procedure:  MITRAL VALVE Ring repair (MVR)/CORONARY ARTERY BYPASS GRAFTING (CABG) times three using left internal mammary and left sphenous vein.;  Surgeon: Loreli SlotSteven C Hendrickson, MD;  Location: [facility] OR;  Service: Open Heart Surgery;  Laterality: N/A;  Coronary artery bypass graft times three using left internal mammary artery and left saphenous leg vein using endoscope.  Exploration of right leg.     Maureen ChattersKatie Lamberton, RD, LDN Pager #: [phone] After-Hours Pager #: [phone]

## 2013-11-16 NOTE — Progress Notes (Signed)
Patient ID: Emily MilchMary R Denslow, female   DOB: 1929/07/14, 80 y.o.   MRN: [id number]  Hemodynamically stable, dopamine off Maintaining sinus rhythm on IV amio  Eating and ambulating.

## 2013-11-16 NOTE — Progress Notes (Addendum)
Physical Therapy Treatment Patient Details Name: Emily Daniel MRN: [id number] DOB: [birth year] Today's Date: 11/16/2013    History of Present Illness 80 year old female who is s/p mitral valve ring repair and CABGx3 on 6/18. Pt extubated 6/21. PMH - CAD, MI, DM    PT Comments    Pt making steady progress. Pt/family refusing SNF and making plans for 24 hour assist.  Follow Up Recommendations  Home health PT;Supervision/Assistance - 24 hour     Equipment Recommendations  Rolling walker with 5" wheels    Recommendations for Other Services       Precautions / Restrictions Precautions Precautions: Sternal;Fall    Mobility  Bed Mobility Overal bed mobility: Needs Assistance Bed Mobility: Supine to Sit     Supine to sit: Mod assist;HOB elevated     General bed mobility comments: Verbal cues for technique. Assist to bring legs over and elevate trunk to sit.   Transfers Overall transfer level: Needs assistance Equipment used: Pushed w/c Transfers: Sit to/from Stand Sit to Stand: +2 physical assistance;Min assist         General transfer comment: Verbal cues for hands on knees for sternal precautions. Assist to bring hips up.  Ambulation/Gait Ambulation/Gait assistance: Min assist;+2 safety/equipment Ambulation Distance (Feet): 150 Feet Assistive device:  (pushed w/c) Gait Pattern/deviations: Step-through pattern;Decreased step length - right;Decreased step length - left;Shuffle;Trunk flexed Gait velocity: decr Gait velocity interpretation: Below normal speed for age/gender General Gait Details: Verbal cues to stand more erect.    Stairs            Wheelchair Mobility    Modified Rankin (Stroke Patients Only)       Balance   Sitting-balance support: No upper extremity supported;Feet supported Sitting balance-Leahy Scale: Fair     Standing balance support: Bilateral upper extremity supported Standing balance-Leahy Scale: Poor                       Cognition Arousal/Alertness: Awake/alert Behavior During Therapy: WFL for tasks assessed/performed Overall Cognitive Status: No family/caregiver present to determine baseline cognitive functioning       Memory: Decreased short-term memory;Decreased recall of precautions              Exercises      General Comments        Pertinent Vitals/Pain VSS    Home Living                      Prior Function            PT Goals (current goals can now be found in the care plan section) Progress towards PT goals: Progressing toward goals    Frequency  Min 3X/week    PT Plan Current plan remains appropriate;Discharge plan needs to be updated    Co-evaluation             End of Session Equipment Utilized During Treatment: Gait belt;Oxygen Activity Tolerance: Patient tolerated treatment well Patient left: in chair;with call bell/phone within reach;with chair alarm set     Time: [id number] PT Time Calculation (min): 23 min  Charges:  $Gait Training: 23-37 mins                    G Codes:      MAYCOCK,CARY 11/16/2013, 3:17 PM  Fluor CorporationCary Maycock PT [phone]

## 2013-11-16 NOTE — Progress Notes (Signed)
Clinical Social Work Department BRIEF PSYCHOSOCIAL ASSESSMENT 11/16/2013  Patient:  Emily Daniel,Emily Daniel     Account Number:  [id number]     Admit date:  11/08/2013  Clinical Social Worker:  Carren RangPURITZ,LINDSAY, LCSWA  Date/Time:  11/16/2013 01:34 PM  Referred by:  Care Management  Date Referred:  11/16/2013 Referred for  SNF Placement   Other Referral:   Interview type:  Patient Other interview type:    PSYCHOSOCIAL DATA Living Status:  ALONE Admitted from facility:   Level of care:   Primary support name:  Edrick Ohobert Hazelip Primary support relationship to patient:  CHILD, ADULT Degree of support available:   Good    CURRENT CONCERNS Current Concerns  Post-Acute Placement   Other Concerns:    SOCIAL WORK ASSESSMENT / PLAN Clinical Social Worker received referral for SNF placement at d/c. CSW introduced self and explained reason for visit. CSW explained SNF process to patient. Patient reported she does not want SNF placement. Patient states her sons are going to work something out where patient can go home with Home Health. Patient gave permission to contact her sons. CSW contacted patient's son and explained social worker role and reason for phone call. Patient's son states patient will have 24/7 supervision at home and they are making arrangements for patient to come back home.   Assessment/plan status:  No Further Intervention Required Other assessment/ plan:   Information/referral to community resources:   SNF information    PATIENT'S/FAMILY'S RESPONSE TO PLAN OF CARE: Patient states she is going home with family. Son states they don't want patient to go to a SNF. CSW will notify Case Manager who will follow up with patient and patient's family at dc. CSW signing off at this time. Please re consult if social work needs arise.        Maree KrabbeLindsay Puritz, MSW, Theresia MajorsLCSWA [phone]

## 2013-11-17 ENCOUNTER — Inpatient Hospital Stay (HOSPITAL_COMMUNITY): Payer: Medicare Other

## 2013-11-17 LAB — GLUCOSE, CAPILLARY
GLUCOSE-CAPILLARY: 130 mg/dL — AB (ref 70–99)
Glucose-Capillary: 114 mg/dL — ABNORMAL HIGH (ref 70–99)
Glucose-Capillary: 159 mg/dL — ABNORMAL HIGH (ref 70–99)
Glucose-Capillary: 177 mg/dL — ABNORMAL HIGH (ref 70–99)
Glucose-Capillary: 95 mg/dL (ref 70–99)
Glucose-Capillary: 206 mg/dL — ABNORMAL HIGH (ref 70–99)

## 2013-11-17 LAB — CBC
HEMATOCRIT: 31 % — AB (ref 36.0–46.0)
Hemoglobin: 10 g/dL — ABNORMAL LOW (ref 12.0–15.0)
MCH: 30.1 pg (ref 26.0–34.0)
MCHC: 32.3 g/dL (ref 30.0–36.0)
MCV: 93.4 fL (ref 78.0–100.0)
PLATELETS: 138 10*3/uL — AB (ref 150–400)
RBC: 3.32 MIL/uL — AB (ref 3.87–5.11)
RDW: 17 % — AB (ref 11.5–15.5)
WBC: 8.3 10*3/uL (ref 4.0–10.5)

## 2013-11-17 LAB — BASIC METABOLIC PANEL
BUN: 33 mg/dL — ABNORMAL HIGH (ref 6–23)
CO2: 22 mEq/L (ref 19–32)
CREATININE: 1.22 mg/dL — AB (ref 0.50–1.10)
Calcium: 8.7 mg/dL (ref 8.4–10.5)
Chloride: 105 mEq/L (ref 96–112)
GFR calc non Af Amer: 40 mL/min — ABNORMAL LOW (ref >90)
GFR, EST AFRICAN AMERICAN: 47 mL/min — AB (ref >90)
Glucose, Bld: 134 mg/dL — ABNORMAL HIGH (ref 70–99)
Potassium: 4 mEq/L (ref 3.7–5.3)
Sodium: 144 mEq/L (ref 137–147)

## 2013-11-17 LAB — TSH: TSH: 0.942 u[IU]/mL (ref 0.350–4.500)

## 2013-11-17 MED ORDER — WARFARIN VIDEO
Freq: Once | Status: AC
  Administered 2013-11-17: 18:00:00

## 2013-11-17 MED ORDER — COUMADIN BOOK
Freq: Once | Status: AC
  Administered 2013-11-17: 18:00:00
  Filled 2013-11-17: qty 1

## 2013-11-17 MED ORDER — SIMVASTATIN 20 MG PO TABS
20.0000 mg | ORAL_TABLET | Freq: Every day | ORAL | Status: DC
  Administered 2013-11-17 – 2013-11-20 (×4): 20 mg via ORAL
  Filled 2013-11-17 (×5): qty 1

## 2013-11-17 MED ORDER — AMIODARONE HCL 200 MG PO TABS
200.0000 mg | ORAL_TABLET | Freq: Two times a day (BID) | ORAL | Status: DC
  Administered 2013-11-17 – 2013-11-21 (×9): 200 mg via ORAL
  Filled 2013-11-17 (×10): qty 1

## 2013-11-17 MED ORDER — METOLAZONE 5 MG PO TABS
5.0000 mg | ORAL_TABLET | Freq: Every day | ORAL | Status: AC
  Administered 2013-11-17: 5 mg via ORAL
  Filled 2013-11-17: qty 1

## 2013-11-17 MED ORDER — SODIUM CHLORIDE 0.9 % IJ SOLN: 10.0000 mL | INTRAMUSCULAR | Status: DC | PRN

## 2013-11-17 MED ORDER — WARFARIN SODIUM 2 MG PO TABS
2.0000 mg | ORAL_TABLET | Freq: Every day | ORAL | Status: DC
  Administered 2013-11-17 – 2013-11-20 (×4): 2 mg via ORAL
  Filled 2013-11-17 (×5): qty 1

## 2013-11-17 MED ORDER — SODIUM CHLORIDE 0.9 % IJ SOLN
10.0000 mL | Freq: Two times a day (BID) | INTRAMUSCULAR | Status: DC
  Administered 2013-11-17: 20 mL
  Administered 2013-11-17 – 2013-11-18 (×2): 10 mL

## 2013-11-17 MED ORDER — WARFARIN - PHYSICIAN DOSING INPATIENT
Freq: Every day | Status: DC
  Administered 2013-11-17 – 2013-11-18 (×2)

## 2013-11-17 MED ORDER — AMIODARONE HCL 200 MG PO TABS: 200.0000 mg | ORAL_TABLET | Freq: Every day | ORAL | Status: DC

## 2013-11-17 MED ORDER — ENOXAPARIN SODIUM 30 MG/0.3ML ~~LOC~~ SOLN
30.0000 mg | SUBCUTANEOUS | Status: DC
  Administered 2013-11-17 – 2013-11-20 (×4): 30 mg via SUBCUTANEOUS
  Filled 2013-11-17 (×6): qty 0.3

## 2013-11-17 NOTE — Progress Notes (Signed)
Patient ID: Emily MilchMary R Daniel, female   DOB: June 12, 1929, 80 y.o.   MRN: [id number] EVENING ROUNDS NOTE :     [address]       [place] [id number]             [phone]                 6 Days Post-Op Procedure(s) (LRB): MITRAL VALVE Ring repair (MVR)/CORONARY ARTERY BYPASS GRAFTING (CABG) times three using left internal mammary and left sphenous vein. (N/A)  Total Length of Stay:  LOS: 9 days  BP 113/72  Pulse 90  Temp(Src) 97.9 F (36.6 C) (Oral)  Resp 26  Ht 5' (1.524 m)  Wt 143 lb 4.8 oz (65 kg)  BMI 27.99 kg/m2  SpO2 96%  .Intake/Output     06/24 0701 - 06/25 0700   P.O. 450   I.V. (mL/kg) 273.4 (4.2)   IV Piggyback    Total Intake(mL/kg) 723.4 (11.1)   Urine (mL/kg/hr) 1045 (1.3)   Stool 1 (0)   Total Output 1046   Net -322.6         . sodium chloride 10 mL/hr at 11/13/13 0400  . sodium chloride 20 mL/hr at 11/17/13 0400  . sodium chloride 250 mL (11/12/13 1200)  . amiodarone 30 mg/hr (11/17/13 0400)  . dexmedetomidine Stopped (11/14/13 1300)  . lactated ringers 20 mL/hr at 11/13/13 0400     Lab Results  Component Value Date   WBC 8.3 11/17/2013   HGB 10.0* 11/17/2013   HCT 31.0* 11/17/2013   PLT 138* 11/17/2013   GLUCOSE 134* 11/17/2013   CHOL 121 09/30/2013   TRIG 76.0 09/30/2013   HDL 44.40 09/30/2013   LDLDIRECT 156.0 03/13/2009   LDLCALC 61 09/30/2013   ALT 54* 11/16/2013   AST 29 11/16/2013   NA 144 11/17/2013   K 4.0 11/17/2013   CL 105 11/17/2013   CREATININE 1.22* 11/17/2013   BUN 33* 11/17/2013   CO2 22 11/17/2013   TSH 0.942 11/17/2013   INR 1.30 11/16/2013   HGBA1C 6.1* 11/10/2013   MICROALBUR 1.5 04/01/2013   Stable day pic line in place , sleeve out  Delight OvensEdward B Jovie Swanner MD  Beeper [phone] Office [phone] 11/17/2013 7:18 PM

## 2013-11-17 NOTE — Progress Notes (Signed)
  Amiodarone Drug - Drug Interaction Consult Note  Recommendations: -Decrease dose of Simvastatin to 20 mg daily or change to alternative agent such as Atorvastatin  -Monitor potassium closely  -Monitor QTc  Amiodarone is metabolized by the cytochrome P450 system and therefore has the potential to cause many drug interactions. Amiodarone has an average plasma half-life of 50 days (range 20 to 100 days).   There is potential for drug interactions to occur several weeks or months after stopping treatment and the onset of drug interactions may be slow after initiating amiodarone.   [x]  Statins: Increased risk of myopathy. Simvastatin- restrict dose to 20mg  daily. Other statins: counsel patients to report any muscle pain or weakness immediately.  []  Anticoagulants: Amiodarone can increase anticoagulant effect. Consider warfarin dose reduction. Patients should be monitored closely and the dose of anticoagulant altered accordingly, remembering that amiodarone levels take several weeks to stabilize.  []  Antiepileptics: Amiodarone can increase plasma concentration of phenytoin, the dose should be reduced. Note that small changes in phenytoin dose can result in large changes in levels. Monitor patient and counsel on signs of toxicity.  []  Beta blockers: increased risk of bradycardia, AV block and myocardial depression. Sotalol - avoid concomitant use.  []   Calcium channel blockers (diltiazem and verapamil): increased risk of bradycardia, AV block and myocardial depression.  []   Cyclosporine: Amiodarone increases levels of cyclosporine. Reduced dose of cyclosporine is recommended.  []  Digoxin dose should be halved when amiodarone is started.  [x]  Diuretics: increased risk of cardiotoxicity if hypokalemia occurs.  []  Oral hypoglycemic agents (glyburide, glipizide, glimepiride): increased risk of hypoglycemia. Patient's glucose levels should be monitored closely when initiating amiodarone therapy.    [x]  Drugs that prolong the QT interval:  Torsades de pointes risk may be increased with concurrent use - avoid if possible.  Monitor QTc, also keep magnesium/potassium WNL if concurrent therapy can't be avoided. [name]. Antibiotics: e.g. fluoroquinolones, erythromycin. . Antiarrhythmics: e.g. quinidine, procainamide, disopyramide, sotalol. . Antipsychotics: e.g. phenothiazines, haloperidol.  . Lithium, tricyclic antidepressants, and methadone. Thank You,  [name], [name]  11/17/2013 7:51 AM

## 2013-11-17 NOTE — Progress Notes (Addendum)
TCTS DAILY ICU PROGRESS NOTE                   [address]            [place] [id number]          [phone]   6 Days Post-Op Procedure(s) (LRB): MITRAL VALVE Ring repair (MVR)/CORONARY ARTERY BYPASS GRAFTING (CABG) times three using left internal mammary and left sphenous vein. (N/A)  Total Length of Stay:  LOS: 9 days   Subjective: OOB in chair. NO AF overnight, still on iv Amio bolus. No complaints. Weak, walked 100 feet    Objective: Vital signs in last 24 hours: Temp:  [97.4 F (36.3 C)-98.5 F (36.9 C)] 97.8 F (36.6 C) (06/24 0350) Pulse Rate:  [75-89] 80 (06/24 0700) Cardiac Rhythm:  [-] Normal sinus rhythm (06/24 0717) Resp:  [18-30] 23 (06/24 0700) BP: (93-135)/(45-94) 119/56 mmHg (06/24 0700) SpO2:  [95 %-100 %] 95 % (06/24 0700) Weight:  [143 lb 4.8 oz (65 kg)] 143 lb 4.8 oz (65 kg) (06/24 0639)  Filed Weights   11/15/13 0500 11/16/13 0630 11/17/13 0639  Weight: 142 lb 13.7 oz (64.8 kg) 141 lb 6.4 oz (64.139 kg) 143 lb 4.8 oz (65 kg)    Weight change: 1 lb 14.4 oz (0.861 kg)   CBGs 117-112-130-146     Intake/Output from previous day: 06/23 0701 - 06/24 0700 In: 1639 [P.O.:600; I.V.:789; IV Piggyback:250] Out: 1375 [Urine:1375]  Intake/Output this shift:    Current Meds: Scheduled Meds: . aspirin  325 mg Oral Daily  . bisacodyl  10 mg Oral Daily   Or  . bisacodyl  10 mg Rectal Daily  . docusate sodium  200 mg Oral Daily  . feeding supplement (ENSURE COMPLETE)  237 mL Oral BID BM  . furosemide  40 mg Intravenous BID  . insulin aspart  0-24 Units Subcutaneous 6 times per day  . pantoprazole  40 mg Oral Daily  . simvastatin  40 mg Oral Daily  . sodium chloride  3 mL Intravenous Q12H   Continuous Infusions: . sodium chloride 10 mL/hr at 11/13/13 0400  . sodium chloride 20 mL/hr at 11/17/13 0400  . sodium chloride 250 mL (11/12/13 1200)  . amiodarone 30 mg/hr (11/17/13 0400)  . dexmedetomidine Stopped (11/14/13 1300)  .  DOPamine Stopped (11/16/13 1102)  . lactated ringers 20 mL/hr at 11/13/13 0400   PRN Meds:.morphine injection, ondansetron (ZOFRAN) IV, oxyCODONE, sodium chloride   Physical Exam: General appearance: alert, cooperative and no distress Heart: regular rate and rhythm Lungs: diminished breath sounds bilaterally Extremities: Feet cool, toes with ischemic changes bilaterally, +LE edema Wound: Clean and dry  Feet look better, warmer to touch, toes  less blue   Lab Results: CBC:  Recent Labs  11/16/13 0425 11/17/13 0420  WBC 11.0* 8.3  HGB 10.3* 10.0*  HCT 32.1* 31.0*  PLT 133* 138*   BMET:   Recent Labs  11/16/13 0425 11/17/13 0420  NA 145 144  K 3.7 4.0  CL 108 105  CO2 22 22  GLUCOSE 146* 134*  BUN 32* 33*  CREATININE 0.90 1.22*  CALCIUM 8.8 8.7    PT/INR:   Recent Labs  11/16/13 0425  LABPROT 15.9*  INR 1.30   Radiology: Dg Chest Port 1 View  11/16/2013   CLINICAL DATA:  Postop chest tube.  EXAM: PORTABLE CHEST - 1 VIEW  COMPARISON:  November 15, 2013.  FINDINGS: Stable cardiomegaly. Status post coronary artery  bypass graft. No pneumothorax is noted. Right internal jugular venous sheath remains. Mild bilateral pleural effusions are noted which may be slightly increased compared to prior exam. Stable left perihilar opacity is noted consistent with subsegmental atelectasis. Bony thorax is intact.  IMPRESSION: Mild bilateral pleural effusions are noted which are slightly increased compared to prior exam. No pneumothorax is noted. Stable left perihilar subsegmental atelectasis.   Electronically Signed   By: Roque LiasJames  Green M.D.   On: 11/16/2013 08:16   Co-Ox= 1.2    Assessment/Plan: S/P Procedure(s) (LRB): MITRAL VALVE Ring repair (MVR)/CORONARY ARTERY BYPASS GRAFTING (CABG) times three using left internal mammary and left saphenous vein. (N/A)  CV- postop AF, now SR. Convert  IV Amiodarone  now.  BPs trending up. Dopamine off   Vol overload- continue diuresis, will  repeat IV Lasix, add zaroxolyn  Hypokalemia- replace K+ per protocol.  PV- ischemic changes to toes bilaterally.  + Doppler pulses with probable ileofemoral disease by ultrasound.  Continue to monitor closely.  Thrombocytopenia- plts improving.  Expected postop blood loss anemia- H/H stable.  Pulm- continue pulm toilet/IS.  Deconditioning- Still quite weak.  PT as tolerated.  Nutrition- no dysphagia per swallow study.  Started on D1 diet until she gets her dentures.    Still waiting for pic line  PICC line with continued need for IV access.  DM- sugars stable, continue SSI and watch as diet is advanced.         D/c foley  Foley   With episodes of afib and mitral ring in place will start coumadin low dose , low dose dvt Lovenox until inr 2.0   GERHARDT,EDWARD B 11/17/2013 7:39 AM

## 2013-11-17 NOTE — Progress Notes (Signed)
Peripherally Inserted Central Catheter/Midline Placement  The IV Nurse has discussed with the patient and/or persons authorized to consent for the patient, the purpose of this procedure and the potential benefits and risks involved with this procedure.  The benefits include less needle sticks, lab draws from the catheter and patient may be discharged home with the catheter.  Risks include, but not limited to, infection, bleeding, blood clot (thrombus formation), and puncture of an artery; nerve damage and irregular heat beat.  Alternatives to this procedure were also discussed.  PICC/Midline Placement Documentation        Emily Daniel, Emily Daniel 11/17/2013, 2:44 PM

## 2013-11-18 ENCOUNTER — Inpatient Hospital Stay (HOSPITAL_COMMUNITY): Payer: Medicare Other

## 2013-11-18 LAB — BASIC METABOLIC PANEL
BUN: 31 mg/dL — ABNORMAL HIGH (ref 6–23)
CO2: 26 mEq/L (ref 19–32)
Calcium: 8.8 mg/dL (ref 8.4–10.5)
Chloride: 101 mEq/L (ref 96–112)
Creatinine, Ser: 1.26 mg/dL — ABNORMAL HIGH (ref 0.50–1.10)
GFR calc Af Amer: 45 mL/min — ABNORMAL LOW (ref >90)
GFR calc non Af Amer: 39 mL/min — ABNORMAL LOW (ref >90)
Glucose, Bld: 78 mg/dL (ref 70–99)
Potassium: 3.1 mEq/L — ABNORMAL LOW (ref 3.7–5.3)
Sodium: 142 mEq/L (ref 137–147)

## 2013-11-18 LAB — GLUCOSE, CAPILLARY
GLUCOSE-CAPILLARY: 57 mg/dL — AB (ref 70–99)
GLUCOSE-CAPILLARY: 74 mg/dL (ref 70–99)
GLUCOSE-CAPILLARY: 114 mg/dL — AB (ref 70–99)
Glucose-Capillary: 131 mg/dL — ABNORMAL HIGH (ref 70–99)
Glucose-Capillary: 260 mg/dL — ABNORMAL HIGH (ref 70–99)
Glucose-Capillary: 108 mg/dL — ABNORMAL HIGH (ref 70–99)
Glucose-Capillary: 155 mg/dL — ABNORMAL HIGH (ref 70–99)

## 2013-11-18 LAB — PROTIME-INR
INR: 1.37 (ref 0.00–1.49)
PROTHROMBIN TIME: 16.9 s — AB (ref 11.6–15.2)

## 2013-11-18 LAB — CBC
HCT: 30.4 % — ABNORMAL LOW (ref 36.0–46.0)
Hemoglobin: 9.8 g/dL — ABNORMAL LOW (ref 12.0–15.0)
MCH: 29.4 pg (ref 26.0–34.0)
MCHC: 32.2 g/dL (ref 30.0–36.0)
MCV: 91.3 fL (ref 78.0–100.0)
Platelets: 192 10*3/uL (ref 150–400)
RBC: 3.33 MIL/uL — ABNORMAL LOW (ref 3.87–5.11)
RDW: 17.1 % — ABNORMAL HIGH (ref 11.5–15.5)
WBC: 12.2 10*3/uL — ABNORMAL HIGH (ref 4.0–10.5)

## 2013-11-18 MED ORDER — METOPROLOL TARTRATE 12.5 MG HALF TABLET
12.5000 mg | ORAL_TABLET | Freq: Two times a day (BID) | ORAL | Status: DC
  Administered 2013-11-18 – 2013-11-19 (×4): 12.5 mg via ORAL
  Filled 2013-11-18 (×6): qty 1

## 2013-11-18 MED ORDER — MOVING RIGHT ALONG BOOK
Freq: Once | Status: DC
  Filled 2013-11-18: qty 1

## 2013-11-18 MED ORDER — INSULIN ASPART 100 UNIT/ML ~~LOC~~ SOLN: 0.0000 [IU] | Freq: Every day | SUBCUTANEOUS | Status: DC

## 2013-11-18 MED ORDER — BISACODYL 10 MG RE SUPP: 10.0000 mg | Freq: Every day | RECTAL | Status: DC | PRN

## 2013-11-18 MED ORDER — BISACODYL 5 MG PO TBEC: 10.0000 mg | DELAYED_RELEASE_TABLET | Freq: Every day | ORAL | Status: DC | PRN

## 2013-11-18 MED ORDER — SODIUM CHLORIDE 0.9 % IJ SOLN: 3.0000 mL | INTRAMUSCULAR | Status: DC | PRN

## 2013-11-18 MED ORDER — POTASSIUM CHLORIDE 10 MEQ/50ML IV SOLN
10.0000 meq | INTRAVENOUS | Status: AC | PRN
  Administered 2013-11-18 (×3): 10 meq via INTRAVENOUS
  Filled 2013-11-18 (×3): qty 50

## 2013-11-18 MED ORDER — ALUM & MAG HYDROXIDE-SIMETH 200-200-20 MG/5ML PO SUSP: 15.0000 mL | ORAL | Status: DC | PRN

## 2013-11-18 MED ORDER — SODIUM CHLORIDE 0.9 % IV SOLN: 250.0000 mL | INTRAVENOUS | Status: DC | PRN

## 2013-11-18 MED ORDER — POTASSIUM CHLORIDE CRYS ER 20 MEQ PO TBCR
20.0000 meq | EXTENDED_RELEASE_TABLET | Freq: Two times a day (BID) | ORAL | Status: DC
  Administered 2013-11-18 – 2013-11-21 (×7): 20 meq via ORAL
  Filled 2013-11-18 (×8): qty 1

## 2013-11-18 MED ORDER — INSULIN ASPART 100 UNIT/ML ~~LOC~~ SOLN
0.0000 [IU] | Freq: Three times a day (TID) | SUBCUTANEOUS | Status: DC
Start: 2013-11-18 — End: 2013-11-21
  Administered 2013-11-18: 5 [IU] via SUBCUTANEOUS
  Administered 2013-11-19: 2 [IU] via SUBCUTANEOUS
  Administered 2013-11-19 – 2013-11-21 (×3): 1 [IU] via SUBCUTANEOUS

## 2013-11-18 MED ORDER — ONDANSETRON HCL 4 MG PO TABS: 4.0000 mg | ORAL_TABLET | Freq: Four times a day (QID) | ORAL | Status: DC | PRN

## 2013-11-18 MED ORDER — SODIUM CHLORIDE 0.9 % IJ SOLN
3.0000 mL | Freq: Two times a day (BID) | INTRAMUSCULAR | Status: DC
  Administered 2013-11-19: 3 mL via INTRAVENOUS

## 2013-11-18 MED ORDER — INSULIN ASPART 100 UNIT/ML ~~LOC~~ SOLN: 0.0000 [IU] | Freq: Three times a day (TID) | SUBCUTANEOUS | Status: DC

## 2013-11-18 MED ORDER — ACETAMINOPHEN 325 MG PO TABS: 650.0000 mg | ORAL_TABLET | Freq: Four times a day (QID) | ORAL | Status: DC | PRN

## 2013-11-18 MED ORDER — MAGNESIUM HYDROXIDE 400 MG/5ML PO SUSP: 30.0000 mL | Freq: Every day | ORAL | Status: DC | PRN

## 2013-11-18 MED ORDER — ONDANSETRON HCL 4 MG/2ML IJ SOLN: 4.0000 mg | Freq: Four times a day (QID) | INTRAMUSCULAR | Status: DC | PRN

## 2013-11-18 NOTE — Progress Notes (Signed)
PT Cancellation Note  Patient Details Name: Emily Daniel MRN: [id number] DOB: [birth year]   Cancelled Treatment:    Reason Eval/Treat Not Completed: Other (comment) (In AM pt had just amb with OT and in PM pt had just amb from 2S to 2W.)   [facility] Center[facility]YC[facility],CARY 11/18/2013, 3:41 PM

## 2013-11-18 NOTE — Evaluation (Signed)
Occupational Therapy Evaluation Patient Details Name: Emily Daniel MRN: [id number] DOB: Jun 08, 1929 Today's Date: 11/18/2013    History of Present Illness 80 year old female who is s/p mitral valve ring repair and CABGx3 on 6/18. Pt extubated 6/21. PMH - CAD, MI, DM   Clinical Impression   Patient is s/p CABGx3 surgery resulting in functional limitations due to the deficits listed below (see OT problem list).  Patient will benefit from skilled OT acutely to increase independence and safety with ADLS to allow discharge HHOT. Ot to follow acutely for adl retraining with sternal precautions.     Follow Up Recommendations  Home health OT    Equipment Recommendations  3 in 1 bedside comode    Recommendations for Other Services       Precautions / Restrictions Precautions Precautions: Sternal;Fall Restrictions Weight Bearing Restrictions: Yes      Mobility Bed Mobility               General bed mobility comments: in chair on arrival  Transfers Overall transfer level: Needs assistance   Transfers: Sit to/from Stand Sit to Stand: Min assist         General transfer comment: excellent return demo of use of pillow    Balance           Standing balance support: Bilateral upper extremity supported;During functional activity Standing balance-Leahy Scale: Fair                              ADL Overall ADL's : Needs assistance/impaired Eating/Feeding: Set up;Sitting   Grooming: Set up;Sitting                   Toilet Transfer: Minimal assistance;BSC;RW           Functional mobility during ADLs: Min guard;Wheelchair General ADL Comments: Pt with excellent recall of precautions. pt ambulating to bathroom and then into hallway. pt with elevated HR 140 and required extended rest break. pt with extended rest break return to 98. Pt ambulating and only incr second attempt to 110. pt with no symptoms and unaware of incr.      Vision                      Perception     Praxis      Pertinent Vitals/Pain HR 140      Hand Dominance Right   Extremity/Trunk Assessment Upper Extremity Assessment Upper Extremity Assessment: Generalized weakness (not assessed pass 90 degrees)   Lower Extremity Assessment Lower Extremity Assessment: Defer to PT evaluation   Cervical / Trunk Assessment Cervical / Trunk Assessment: Normal   Communication Communication Communication: No difficulties   Cognition Arousal/Alertness: Awake/alert Behavior During Therapy: WFL for tasks assessed/performed Overall Cognitive Status: Within Functional Limits for tasks assessed                     General Comments       Exercises       Shoulder Instructions      Home Living Family/patient expects to be discharged to:: Private residence Living Arrangements: Alone Available Help at Discharge: Family;Available PRN/intermittently Type of Home: House Home Access: Stairs to enter Entrance Stairs-Number of Steps: 1   Home Layout: One level         Bathroom Toilet: Standard     Home Equipment: None   Additional Comments: Likes to work in her yard.  Prior Functioning/Environment Level of Independence: Independent             OT Diagnosis: Generalized weakness;Acute pain   OT Problem List: Decreased strength;Decreased activity tolerance;Impaired balance (sitting and/or standing);Decreased safety awareness;Decreased knowledge of use of DME or AE;Decreased knowledge of precautions;Cardiopulmonary status limiting activity;Pain   OT Treatment/Interventions: Self-care/ADL training;Therapeutic exercise;DME and/or AE instruction;Energy conservation;Therapeutic activities;Patient/family education;Balance training    OT Goals(Current goals can be found in the care plan section) Acute Rehab OT Goals Patient Stated Goal: Return home and work in the yard. OT Goal Formulation: With patient Time For Goal Achievement:  12/02/13 Potential to Achieve Goals: Good  OT Frequency: Min 2X/week   Barriers to D/C:            Co-evaluation              End of Session Equipment Utilized During Treatment: Gait belt Nurse Communication: Mobility status;Precautions  Activity Tolerance: Patient tolerated treatment well Patient left: in chair;with call bell/phone within reach   Time: 1012-1034 OT Time Calculation (min): 22 min Charges:  OT General Charges $OT Visit: 1 Procedure OT Evaluation $Initial OT Evaluation Tier I: 1 Procedure OT Treatments $Self Care/Home Management : 8-22 mins G-Codes:    Harolyn RutherfordJones, Jessica B 11/18/2013, 11:35 AM Pager: [phone]

## 2013-11-18 NOTE — Progress Notes (Signed)
Pt transferred to 2W23, pt ambulated distance to new room, tolerated well, pt placed in chair on arrival to new unit per pt request. Pt transferred on portable tele/room air. Pt son called and notified of pt transfer. Pt belongings (pt wearing dentures) sent to new room. Ander PurpuraKim Glover RN present on arrival to new room, pt placed on receiving units tele. Will continue to monitor. Koren BoundWaggoner, Brodyn Depuy

## 2013-11-18 NOTE — Progress Notes (Addendum)
Inpatient Diabetes Program Recommendations  AACE/ADA: New Consensus Statement on Inpatient Glycemic Control (2013)  Target Ranges:  Prepandial:   less than 140 mg/dL      Peak postprandial:   less than 180 mg/dL (1-2 hours)      Critically ill patients:  140 - 180 mg/dL   Results for Natale MilchSPILLERS, Cydnee R (MRN [id number]) as of 11/18/2013 10:30  Ref. Range 11/17/2013 07:38 11/17/2013 11:32 11/17/2013 16:01 11/17/2013 19:27 11/17/2013 23:26 11/18/2013 03:29 11/18/2013 03:47 11/18/2013 08:24  Glucose-Capillary Latest Range: 70-99 mg/dL 782130 (H) 956159 (H) 213177 (H) 95 206 (H) 57 (L) 74 114 (H)   Diabetes history: DM2 Outpatient Diabetes medications: Metformin 500 mg QHS Current orders for Inpatient glycemic control: Novolog 0-24 units Q4H  Inpatient Diabetes Program Recommendations Correction (SSI): Please consider changing Novolog correction using the regular glycemic control order set with sensitive scale ACHS.  Thanks, Orlando PennerMarie Byrd, RN, MSN, CCRN Diabetes Coordinator Inpatient Diabetes Program [phone] (Team Pager) [phone] ([facility] office) [phone] [facility] office)

## 2013-11-18 NOTE — Progress Notes (Addendum)
TCTS DAILY ICU PROGRESS NOTE                   [address]            [place] [id number]          [phone]   7 Days Post-Op Procedure(s) (LRB): MITRAL VALVE Ring repair (MVR)/CORONARY ARTERY BYPASS GRAFTING (CABG) times three using left internal mammary and left sphenous vein. (N/A)  Total Length of Stay:  LOS: 10 days   Subjective: Feels well, coughing up some clear sputum. No other complaints.   Objective: Vital signs in last 24 hours: Temp:  [97.8 F (36.6 C)-98.7 F (37.1 C)] 98.5 F (36.9 C) (06/25 0400) Pulse Rate:  [29-144] 87 (06/25 0700) Cardiac Rhythm:  [-] Normal sinus rhythm;Atrial fibrillation (06/25 0400) Resp:  [12-36] 27 (06/25 0700) BP: (96-136)/(34-101) 131/54 mmHg (06/25 0700) SpO2:  [91 %-99 %] 95 % (06/25 0700) Weight:  [139 lb 5.3 oz (63.2 kg)] 139 lb 5.3 oz (63.2 kg) (06/25 0630)  Filed Weights   11/16/13 0630 11/17/13 0639 11/18/13 0630  Weight: 141 lb 6.4 oz (64.139 kg) 143 lb 4.8 oz (65 kg) 139 lb 5.3 oz (63.2 kg)    Weight change: -3 lb 15.5 oz (-1.8 kg)   Hemodynamic parameters for last 24 hours:    Intake/Output from previous day: 06/24 0701 - 06/25 0700 In: 1163.4 [P.O.:570; I.V.:493.4; IV Piggyback:100] Out: 3196 [Urine:3195; Stool:1]  CBGs 177-95-206-74-78     Current Meds: Scheduled Meds: . amiodarone  200 mg Oral Q12H   Followed by  . [START ON 11/24/2013] amiodarone  200 mg Oral Daily  . aspirin  325 mg Oral Daily  . bisacodyl  10 mg Oral Daily   Or  . bisacodyl  10 mg Rectal Daily  . docusate sodium  200 mg Oral Daily  . enoxaparin (LOVENOX) injection  30 mg Subcutaneous Q24H  . feeding supplement (ENSURE COMPLETE)  237 mL Oral BID BM  . furosemide  40 mg Intravenous BID  . insulin aspart  0-24 Units Subcutaneous 6 times per day  . pantoprazole  40 mg Oral Daily  . simvastatin  20 mg Oral q1800  . sodium chloride  10-40 mL Intracatheter Q12H  . sodium chloride  3 mL Intravenous Q12H  . warfarin  2  mg Oral q1800  . Warfarin - Physician Dosing Inpatient   Does not apply q1800   Continuous Infusions: . sodium chloride 10 mL/hr at 11/13/13 0400  . sodium chloride 20 mL/hr at 11/18/13 0400  . sodium chloride 250 mL (11/12/13 1200)  . amiodarone 30 mg/hr (11/17/13 0400)  . dexmedetomidine Stopped (11/14/13 1300)  . lactated ringers 20 mL/hr at 11/13/13 0400   PRN Meds:.morphine injection, ondansetron (ZOFRAN) IV, oxyCODONE, potassium chloride, sodium chloride, sodium chloride   Physical Exam: General appearance: alert, cooperative and no distress Heart: regular rate and rhythm Lungs: Slightly diminished BS in bases  Extremities: Mild LE edema, L>R.  Ischemic changes to toes almost completely resolved bilaterally.  Feet warm. Wound: Clean and dry    Lab Results: CBC: Recent Labs  11/17/13 0420 11/18/13 0350  WBC 8.3 12.2*  HGB 10.0* 9.8*  HCT 31.0* 30.4*  PLT 138* 192   BMET:  Recent Labs  11/17/13 0420 11/18/13 0350  NA 144 142  K 4.0 3.1*  CL 105 101  CO2 22 26  GLUCOSE 134* 78  BUN 33* 31*  CREATININE 1.22* 1.26*  CALCIUM 8.7 8.8  PT/INR:  Recent Labs  11/18/13 0350  LABPROT 16.9*  INR 1.37   Radiology: Dg Chest Port 1 View  11/17/2013   CLINICAL DATA:  Line placement.  EXAM: PORTABLE CHEST - 1 VIEW  COMPARISON:  11/17/2013.  FINDINGS: Right IJ sheath in stable position. Left PICC line in with tip projected over superior vena cava. Cardiomegaly. Prior CABG. Pacing wires noted over left chest. Pulmonary interstitial prominence and bilateral pleural effusions left side greater than right noted. These findings consistent with a congestive heart failure. Basilar atelectasis is present bilaterally. No pneumothorax.  IMPRESSION: 1. PICC line noted with its tip projected over superior vena cava. Right IJ sheath in stable position. 2. Congestive heart failure with interstitial edema and pleural effusions again noted. Prior CABG.   Electronically Signed   By:  Maisie Fushomas  Register   On: 11/17/2013 15:58   Dg Chest Port 1 View  11/17/2013   CLINICAL DATA:  Status post cardiac surgery  EXAM: PORTABLE CHEST - 1 VIEW  COMPARISON:  11/16/2013  FINDINGS: A right jugular sheath remains in place. Postsurgical changes are again noted. Bilateral pleural effusions are again seen as well as some left midlung atelectasis. The overall appearance is stable. No pneumothorax is seen. No other focal abnormality is noted.  IMPRESSION: Stable appearance from the previous day.   Electronically Signed   By: Alcide CleverMark  Lukens M.D.   On: 11/17/2013 08:06     Assessment/Plan: S/P Procedure(s) (LRB): MITRAL VALVE Ring repair (MVR)/CORONARY ARTERY BYPASS GRAFTING (CABG) times three using left internal mammary and left sphenous vein. (N/A)  CV- postop AF, now generally maintaining SR.  BPs overall stable. Off all drips. Continue Amio, Coumadin. Possibly start low dose beta blocker soon.   Vol overload- continue IV Lasix, Zaroxolyn.  Hypokalemia- receiving runs of K, will add po dose.   PV- ischemic changes to toes almost completely resolved.  Continue to monitor.  Thrombocytopenia- plts improved.  Expected postop blood loss anemia- H/H generally stable.   Pulm- continue pulm toilet/IS.   Deconditioning- Still quite weak. PT as tolerated.   Nutrition- tolerating po diet.  DM- sugars stable, continue SSI and watch.  Hopefully as po intake improves, can resume Metformin.   Possibly ready for transfer stepdown soon.  [name],[name] 11/18/2013 8:04 AM  Continues to improve  To stepdown today Continue coumadin  I have seen and examined Emily Daniel and agree with the above assessment  and plan.  [name] MD [facility] [phone] Office [phone] 11/18/2013 8:56 AM

## 2013-11-19 ENCOUNTER — Inpatient Hospital Stay (HOSPITAL_COMMUNITY): Payer: Medicare Other

## 2013-11-19 DIAGNOSIS — Z9889 Other specified postprocedural states: Secondary | ICD-10-CM

## 2013-11-19 LAB — BASIC METABOLIC PANEL
BUN: 26 mg/dL — AB (ref 6–23)
CALCIUM: 8.9 mg/dL (ref 8.4–10.5)
CHLORIDE: 99 meq/L (ref 96–112)
CO2: 28 meq/L (ref 19–32)
CREATININE: 1.2 mg/dL — AB (ref 0.50–1.10)
GFR calc Af Amer: 48 mL/min — ABNORMAL LOW (ref >90)
GFR calc non Af Amer: 41 mL/min — ABNORMAL LOW (ref >90)
GLUCOSE: 141 mg/dL — AB (ref 70–99)
Potassium: 3.3 mEq/L — ABNORMAL LOW (ref 3.7–5.3)
Sodium: 143 mEq/L (ref 137–147)

## 2013-11-19 LAB — GLUCOSE, CAPILLARY
GLUCOSE-CAPILLARY: 175 mg/dL — AB (ref 70–99)
GLUCOSE-CAPILLARY: 93 mg/dL (ref 70–99)
Glucose-Capillary: 139 mg/dL — ABNORMAL HIGH (ref 70–99)
Glucose-Capillary: 147 mg/dL — ABNORMAL HIGH (ref 70–99)

## 2013-11-19 LAB — CBC
HEMATOCRIT: 30.2 % — AB (ref 36.0–46.0)
Hemoglobin: 9.7 g/dL — ABNORMAL LOW (ref 12.0–15.0)
MCH: 29.9 pg (ref 26.0–34.0)
MCHC: 32.1 g/dL (ref 30.0–36.0)
MCV: 93.2 fL (ref 78.0–100.0)
Platelets: 207 10*3/uL (ref 150–400)
RBC: 3.24 MIL/uL — AB (ref 3.87–5.11)
RDW: 17.3 % — ABNORMAL HIGH (ref 11.5–15.5)
WBC: 9.2 10*3/uL (ref 4.0–10.5)

## 2013-11-19 LAB — PROTIME-INR
INR: 1.52 — ABNORMAL HIGH (ref 0.00–1.49)
Prothrombin Time: 18.3 seconds — ABNORMAL HIGH (ref 11.6–15.2)

## 2013-11-19 MED ORDER — FUROSEMIDE 40 MG PO TABS
40.0000 mg | ORAL_TABLET | Freq: Every day | ORAL | Status: DC
  Administered 2013-11-20 – 2013-11-21 (×2): 40 mg via ORAL
  Filled 2013-11-19 (×3): qty 1

## 2013-11-19 MED ORDER — SODIUM CHLORIDE 0.9 % IJ SOLN
10.0000 mL | INTRAMUSCULAR | Status: DC | PRN
  Administered 2013-11-18: 10 mL
  Administered 2013-11-19: 20 mL
  Administered 2013-11-19: 10 mL
  Administered 2013-11-19: 20 mL
  Administered 2013-11-20: 10 mL

## 2013-11-19 MED ORDER — COUMADIN BOOK
Freq: Once | Status: AC
  Administered 2013-11-19: 12:00:00
  Filled 2013-11-19: qty 1

## 2013-11-19 NOTE — Progress Notes (Addendum)
      [address]       [place] [id number]             [phone]      8 Days Post-Op Procedure(s) (LRB): MITRAL VALVE Ring repair (MVR)/CORONARY ARTERY BYPASS GRAFTING (CABG) times three using left internal mammary and left sphenous vein. (N/A)  Subjective:  [name] has no complaints this morning.  She is hoping to go home today.  She is ambulating with minimal assistance.   Objective: Vital signs in last 24 hours: Temp:  [97.4 F (36.3 C)-98.7 F (37.1 C)] 98.6 F (37 C) (06/26 0629) Pulse Rate:  [82-102] 86 (06/26 0629) Cardiac Rhythm:  [-] Heart block (06/26 0729) Resp:  [20-35] 20 (06/26 0629) BP: (93-148)/(39-94) 112/52 mmHg (06/26 0629) SpO2:  [91 %-97 %] 94 % (06/26 0629) Weight:  [134 lb 7.7 oz (61 kg)] 134 lb 7.7 oz (61 kg) (06/26 0629)  Intake/Output from previous day: 06/25 0701 - 06/26 0700 In: 580 [P.O.:440; I.V.:90; IV Piggyback:50] Out: 1950 [Urine:1950]  General appearance: alert, cooperative and no distress Heart: regular rate and rhythm Lungs: clear to auscultation bilaterally Abdomen: soft, non-tender; bowel sounds normal; no masses,  no organomegaly Extremities: extremities normal, atraumatic, no cyanosis or edema Wound: clean and dry  Lab Results:  Recent Labs  11/18/13 0350 11/19/13 0630  WBC 12.2* 9.2  HGB 9.8* 9.7*  HCT 30.4* 30.2*  PLT 192 207   BMET:  Recent Labs  11/18/13 0350 11/19/13 0630  NA 142 143  K 3.1* 3.3*  CL 101 99  CO2 26 28  GLUCOSE 78 141*  BUN 31* 26*  CREATININE 1.26* 1.20*  CALCIUM 8.8 8.9    PT/INR:  Recent Labs  11/18/13 0350  LABPROT 16.9*  INR 1.37   ABG    Component Value Date/Time   PHART 7.447 11/14/2013 1349   HCO3 20.0 11/14/2013 1349   TCO2 21 11/14/2013 1349   ACIDBASEDEF 3.0* 11/14/2013 1349   O2SAT 59.1 11/16/2013 0500   CBG (last 3)   Recent Labs  11/18/13 1143 11/18/13 1628 11/18/13 2138  GLUCAP 260* 108* 155*    Assessment/Plan: S/P Procedure(s)  (LRB): MITRAL VALVE Ring repair (MVR)/CORONARY ARTERY BYPASS GRAFTING (CABG) times three using left internal mammary and left sphenous vein. (N/A)  1. CV- Previous A. Fib, maintaining NSR- on Amiodarone, Lopressor 2. INR-pending, will reorder Coumadin at 2 mg daily 3. Renal- creatinine remains stable, + hypervolemia, weight up about 7 lbs, continue lasix 4. Hypokalemia- continue supplementation 5. PV- ischemic changes in toes, stable continue to monitor 6. DM- continue SSIP, will start Metformin tomorrow 7. Dispo- patient doing well, will d/c EPW, continue Coumadin, likely d/c in the next 24-48 hours  LOS: 11 days    Daniel, Emily 11/19/2013  Likely d/c home 2-3 days with pt and home nurse support I have seen and examined Emily Daniel and agree with the above assessment  and plan.  [name] MD [facility] [phone] Office [phone] 11/19/2013 10:38 AM

## 2013-11-19 NOTE — Progress Notes (Signed)
1000-1103 Completed education with pt and son who voiced understanding. Gave CHF packet and reviewed. Encouraged daily weights and watching sodium. Briefly reviewed dark green leafy vegetables with Coumadin. Asked pharmacist to discuss Coumadin with pt and son. Son stated pt did not have access to rolling walker so asked case manager to get. Discussed CRP 2 and permission given to refer to [place]. Gave low sodium and diabetic diets also and discussed watching carbs and some healthy food choices. Luetta NuttingCharlene Dunlap RN BSN 11/19/2013 11:03 AM

## 2013-11-19 NOTE — Progress Notes (Signed)
CARDIAC REHAB PHASE I   PRE:  Rate/Rhythm: 92 SR  BP:  Supine:   Sitting: 121/67  Standing:    SaO2: 94%RA  MODE:  Ambulation: 350 ft   POST:  Rate/Rhythm: 100 SR  BP:  Supine:   Sitting: 120/51  Standing:    SaO2: 97%RA 0822-0852 Pt walked 350 ft on RA with rolling walker and asst x 1 with fairly steady gait. Tolerated well and could have gone farther except for right hip pain. Pt to check with family to see if rolling walker available. To recliner after walk with call bell.    Luetta Nuttingharlene Dunlap, RN BSN  11/19/2013 8:48 AM

## 2013-11-19 NOTE — Discharge Summary (Signed)
Physician Discharge Summary  Patient ID: DAHLIA NIFONG MRN: [id number] DOB/AGE: 78-Aug-1931 78 y.o.  Admit date: 11/08/2013 Discharge date: 11/21/2013  Admission Diagnoses:  Patient Active Problem List   Diagnosis Date Noted  . Chest pain 11/08/2013  . NSTEMI (non-ST elevated myocardial infarction) 11/08/2013  . Iron deficiency anemia 11/08/2013  . Acute heart failure 11/08/2013  . Cerumen impaction 09/30/2013  . B12 DEFICIENCY 06/21/2010  . OSTEOPOROSIS 03/16/2010  . BACK PAIN 01/02/2010  . PVD 04/14/2009  . ARTERIOVENOUS MALFORMATION, COLON 04/14/2009  . CHANGE IN BOWELS 04/14/2009  . DIABETES MELLITUS, TYPE II, UNCONTROLLED 03/13/2009  . ANXIETY STATE, UNSPECIFIED 03/13/2009  . DYSURIA 04/01/2008  . HYPERLIPIDEMIA 05/18/2007  . HYPERTENSION 02/02/2007  . ARTIFICIAL MENOPAUSE 02/02/2007   Discharge Diagnoses:   Patient Active Problem List   Diagnosis Date Noted  . S/P mitral valve repair 11/19/2013  . S/P CABG x 3 11/11/2013  . Chest pain 11/08/2013  . NSTEMI (non-ST elevated myocardial infarction) 11/08/2013  . Iron deficiency anemia 11/08/2013  . Acute heart failure 11/08/2013  . Cerumen impaction 09/30/2013  . B12 DEFICIENCY 06/21/2010  . OSTEOPOROSIS 03/16/2010  . BACK PAIN 01/02/2010  . PVD 04/14/2009  . ARTERIOVENOUS MALFORMATION, COLON 04/14/2009  . CHANGE IN BOWELS 04/14/2009  . DIABETES MELLITUS, TYPE II, UNCONTROLLED 03/13/2009  . ANXIETY STATE, UNSPECIFIED 03/13/2009  . DYSURIA 04/01/2008  . HYPERLIPIDEMIA 05/18/2007  . HYPERTENSION 02/02/2007  . ARTIFICIAL MENOPAUSE 02/02/2007   Discharged Condition: good  History of Present Illness:   Ms. Friese is an 80 yo white female with known history of CAD with known Myocardial infarction in 1985.  The patient also has a history of Type II Diabetes, Hypertension, Dyslipidemia, and AVMs of the GI tract.  She has done well and remains active.  However over the past 4-5 months she developed chest discomfort.   She described the pain as sharp and burning that starts in her left axilla and radiates to her left elbow.  The pain radiates across her chest and into the right arm.  The pain is exertional but relieves with rest.  However, over the past several days the patient developed exertional shortness of breath, which inhibited the patient to only taking a few steps.  This progressed into orthopnea and the patient developed peripheral edema.  The patient developed a prolonged episode of chest pain and shortness of breath, prompting her to present to the Emergency Department.  Workup in the ED ruled the patient in for NQWMI.  She was also found to be in CHF with pulmonary edema.  She was subsequently admitted for further workup.  Hospital Course:   The patient underwent Echocardiogram which showed a reduced EF of 30-35% with severe MR.  She underwent cardiac catheterization which showed severe 3 vessel CAD with Left Main involvement.  It was felt she would benefit from Coronary Bypass Grafting procedure and possible Mitral Valve Repair.  She was evaluated by Dr. Dorris Fetch, and after review of patient's imaging studies was in agreement patient would benefit from surgical intervention.  The risks and benefits of the procedure were explained to the patient and she was agreeable to proceed.  The patient was taken to the operating room on 11/13/2013.  She underwent CABG x 3 utilizing LIMA to LAD, SVG to OM1, and SVG to Distal RCA.  She also underwent Mitral Valve Repair utilizing a 30 mm Sorin Memo 3D annuloplasty ring and closure of a PFO.  She also underwent Endoscopic Saphenous Vein Harvest of the left  leg.  She tolerated the procedure well and was taken to the SICU in stable condition.  During her stay in the SICU the patient was extubated on POD #3.  She was weaned off Dopamine, Milrinone, and Neo Synephrine Drips as tolerated.  Her chest tubes and arterial lines were removed without difficulty.  The patient developed  ischemic changes in her toes, which was felt to be related to shower emboli.  She developed rapid Atrial Fibrillation and was treated with IV Amiodarone with successful conversion to NSR.  However, she continued to have intermittent Atrial Fibrillation and was started on Coumadin.  She was diuresed for hypervolemia.  She continued to progress and was transferred to the step down unit in stable condition.    The patient has continued to progress.  She has not been eating a full diet and therefore her Metformin has not be restarted.  She is maintaining NSR and her pacing wires have been removed without difficulty.  Her INR is trending upward and she currently is on 2 mg of Coumadin.  Her ischemic changes in her toes are stable.  She is ambulating with minimal difficulty.  She is tolerating a carb modified diet and her oral intake has improved.  She will resume her Metformin at discharge.  Should no further issues arise we anticipate discharge home in the next 24-48 hours.    Significant Diagnostic Studies: cardiac graphics:  Echocardiogram:   - Left ventricle: The cavity size was dilated. Wall thickness was normal. Systolic function was reduced. The estimated ejection fraction was in the range of 20% to 25%. Severe diffuse hypokinesis with regional variations. Akinesis of the basal inferior myocardium. Severe hypokinesis of the basal-mid inferolateral and inferior myocardium. Features are consistent with a pseudo normal left ventricular filling pattern, with concomitant abnormal relaxation and increased filling pressure (grade 2 diastolic dysfunction). - Aortic valve: Normal-sized, mildly to moderately calcified annulus. Tri leaflet; mildly thickened leaflets. There was trivial regurgitation directed centrally in the LOT. - Mitral valve: Mildly dilated annulus. Moderately thickened leaflets . There was mal coaptation of the valve leaflets. There was contractile dysfunction of the papillary  muscles. There was moderate to severe regurgitation directed centrally. - Left atrium: No evidence of thrombus in the atrial cavity or appendage. - Atrial septum: There was a patent foramen ovale. There was a left-to-right shunt through a patent foramen ovale. - Tricuspid valve: Dilated annulus. Normal thickness leaflets. There was moderate regurgitation directed centrally. - Pulmonic valve: There was a vegetation. - Line: A venous line (catheter or pacing wire) was visualized in the right atrial cavity, right ventricular cavity, and right ventricular outflow tract. - Pericardium, extracardiac: There was a left pleural effusion.  Cardiac Catheterization:   HEMODYNAMICS:  RA: A wave 12 V wave 9, mean 8  RV: 35/10  PA: 35/16  PC: A wave 16 V wave 26, mean 17  LV: 98/18  SO: 98/45  Oxygen saturation in the aorta 93% and the pulmonary artery 66 %  Cardiac output: 2.7 al/min (Thermo); 5.2 (Fick)  Cardiac index: 1.7 al/am/OM1 3.4   ANGIOGRAPHY:  Fluoroscopy revealed severe coronary calcification  Left main: Calcified vessel with 90% eccentric distal stenosis prior to bifurcating into the LAD and left circumflex vessel.  LAD: 30% proximal stenosis with mild 20% mid LAD narrowing after diagonal vessel.  Left circumflex: 99% ostial stenosis followed by 80% eccentric calcified proximal stenosis before the first marginal branch.  Right coronary artery: 99% mid subtotal occlusion with evidence for bridging  collaterals. Regarding marginal branch. There are also faint left to right collaterals to the distal RCA  Left ventriculography dilated left ventricle with an ejection fraction of 30 to less than 35%. There is at least 3-4+ angiographic mitral regurgitation.  Selective angiography in the right iliac artery suggested total proximal iliac occlusion. Distal aortography was performed, which revealed probable 50% right renal artery stenosis. There was total occlusion of the right iliac at its  origin from the aorta. There was distal recanalization. There was 70% ostial left iliac stenosis   Treatments: surgery:   Median sternotomy, extracorporeal circulation, coronary artery bypass grafting x (left internal mammary artery to left anterior descending, saphenous vein graft to obtuse marginal 1, saphenous vein graft to distal right coronary), mitral valve repair with Sorin memo 3D 30 mm annuloplasty ring, serial # [id number], closure of patent foramen  ovale.  Disposition: Home  Discharge Medications:     Medication List    STOP taking these medications       ibuprofen 400 MG tablet  Commonly known as:  ADVIL,MOTRIN     lisinopril 20 MG tablet  Commonly known as:  PRINIVIL,ZESTRIL      TAKE these medications       amiodarone 200 MG tablet  Commonly known as:  PACERONE  Take 1 tablet (200 mg total) by mouth every 12 (twelve) hours. For 4 Days, then decrease to 200 mg daily     aspirin 81 MG tablet  Take 81 mg by mouth daily.     Calcium-Vitamin D 600-200 MG-UNIT Caps  Take by mouth 2 (two) times daily.     Casanthranol-Docusate Sodium 30-100 MG Caps  Take 1 capsule by mouth daily as needed. As needed for constipation     furosemide 40 MG tablet  Commonly known as:  LASIX  Take 1 tablet (40 mg total) by mouth daily. For 7 Days     glucose blood test strip  Commonly known as:  ONE TOUCH ULTRA TEST  TEST TWICE DAILY Dx 250.02     Lancets Misc  by Does not apply route.     metFORMIN 500 MG 24 hr tablet  Commonly known as:  GLUCOPHAGE XR  1 tablet (500 mg) by mouth every night.  Recheck labs in 3 months (11/2013).     metoprolol tartrate 25 MG tablet  Commonly known as:  LOPRESSOR  Take 1 tablet (25 mg total) by mouth 2 (two) times daily.     multivitamin tablet  Take 1 tablet by mouth daily.     oxyCODONE 5 MG immediate release tablet  Commonly known as:  Oxy IR/ROXICODONE  Take 1-2 tablets (5-10 mg total) by mouth every 3 (three) hours as needed for  moderate pain.     potassium chloride SA 20 MEQ tablet  Commonly known as:  K-DUR,KLOR-CON  Take 1 tablet (20 mEq total) by mouth 2 (two) times daily. For 7 Days     simvastatin 40 MG tablet  Commonly known as:  ZOCOR  Take 40 mg by mouth daily.     vitamin B-12 1000 MCG tablet  Commonly known as:  CYANOCOBALAMIN  Take 1,000 mcg by mouth daily.     warfarin 2 MG tablet  Commonly known as:  COUMADIN  Take 1 tablet (2 mg total) by mouth daily at 6 PM.        The patient has been discharged on:   1.Beta Blocker:  Yes [ x  ]  No   [   ]                              If No, reason:  2.Ace Inhibitor/ARB: Yes [   ]                                     No  [x    ]                                     If No, reason: elevated creatinine  3.Statin:   Yes [x  ]                  No  [   ]                  If No, reason:  4.Casa:  Yes  [ x  ]                  No   [   ]                  If No, reason:    Signed: BARRETT, ERIN 11/19/2013, 8:38 AM

## 2013-11-19 NOTE — Discharge Instructions (Signed)
Coronary Artery Bypass Grafting, Care After °Refer to this sheet in the next few weeks. These instructions provide you with information on caring for yourself after your procedure. Your health care provider may also give you more specific instructions. Your treatment has been planned according to current medical practices, but problems sometimes occur. Call your health care provider if you have any problems or questions after your procedure. °WHAT TO EXPECT AFTER THE PROCEDURE °Recovery from surgery will be different for everyone. Some people feel well after 3 or 4 weeks, while for others it takes longer. After your procedure, it is typical to have the following: °· Nausea and a lack of appetite.   °· Constipation. °· Weakness and fatigue.   °· Depression or irritability.   °· Pain or discomfort at your incision site. °HOME CARE INSTRUCTIONS °· Take all medicines as directed by your health care provider. Do not stop taking medicines or start any new medicines without first checking with your health care provider. °· Take your pulse as directed by your health care provider. °· Perform deep breathing as directed by your health care provider. If you were given a device called an incentive spirometer, use it to practice deep breathing several times a day. Support your chest with a pillow or your arms when you take deep breaths or cough. °· Keep incision areas clean, dry, and protected. Remove or change any bandages (dressings) only as directed by your health care provider. You may have skin adhesive strips over the incision areas. Do not take the strips off. They will fall off on their own. °· Check incision areas daily for any swelling, redness, or drainage. °· If incisions were made in your legs, do the following: °¨ Avoid crossing your legs.   °¨ Avoid sitting for long periods of time. Change positions every 30 minutes.   °¨ Elevate your legs when you are sitting. °· Wear compression stockings as directed by your  health care provider. These stockings help keep blood clots from forming in your legs. °· Take showers once your health care provider approves. Until then, only take sponge baths. Pat incisions dry. Do not rub incisions with a washcloth or towel. Do not bathe, swim, or use a hot tub until directed by your health care provider. °· Eat foods that are high in fiber, such as raw fruits and vegetables, whole grains, beans, and nuts. Meats should be lean cut. Avoid canned, processed, and fried foods. °· Drink enough fluids to keep your urine clear or pale yellow. °· Weigh yourself every day. This helps identify if you are retaining fluid that may make your heart and lungs work harder. °· Rest and limit activity as directed by your health care provider. You may be instructed to: °¨ Stop any activity at once if you have chest pain, shortness of breath, irregular heartbeats, or dizziness. Get help right away if you have any of these symptoms. °¨ Move around frequently for short periods or take short walks as directed by your health care provider. Increase your activities gradually. You may need physical therapy or cardiac rehabilitation to help strengthen your muscles and build your endurance. °¨ Avoid lifting, pushing, or pulling anything heavier than 10 lb (4.5 kg) for at least 6 weeks after surgery. °· Do not drive until your health care provider approves.  °· Ask your health care provider when you may return to work. °· Ask your health care provider when you may resume sexual activity. °· Follow up with your health care provider as directed. °SEEK   MEDICAL CARE IF: °· You have swelling, redness, increasing pain, or drainage at the site of an incision. °· You have a fever. °· You have swelling in your ankles or legs. °· You have pain in your legs.   °· You gain 2 or more pounds (0.9 kg) a day. °· You are nauseous or vomit. °· You have diarrhea.  °SEEK IMMEDIATE MEDICAL CARE IF: °· You have chest pain that goes to your jaw  or arms. °· You have shortness of breath.   °· You have a fast or irregular heartbeat.   °· You notice a "clicking" in your breastbone (sternum) when you move.   °· You have numbness or weakness in your arms or legs. °· You feel dizzy or light-headed.   °MAKE SURE YOU: °· Understand these instructions. °· Will watch your condition. °· Will get help right away if you are not doing well or get worse. °Document Released: 11/30/2004 Document Revised: 05/18/2013 Document Reviewed: 10/20/2012 °ExitCare® Patient Information ©2015 [employer]. This information is not intended to replace advice given to you by your health care provider. Make sure you discuss any questions you have with your health care provider. ° °Endoscopic Saphenous Vein Harvesting °Care After °Refer to this sheet in the next few weeks. These instructions provide you with information on caring for yourself after your procedure. Your health care provider may also give you more specific instructions. Your treatment has been planned according to current medical practices, but problems sometimes occur. Call your health care provider if you have any problems or questions after your procedure. °HOME CARE INSTRUCTIONS °Medicine °· Take whatever pain medicine your surgeon prescribes. Follow the directions carefully. Do not take over-the-counter pain medicine unless your surgeon says it is okay. Some pain medicine can cause bleeding problems for several weeks after surgery. °· Follow your surgeon's instructions about driving. You will probably not be permitted to drive after heart surgery. °· Take any medicines your surgeon prescribes. Any medicines you took before your heart surgery should be checked with your health care provider before you start taking them again. °Wound care °· If your surgeon has prescribed an elastic bandage or stocking, ask how long you should wear it. °· Check the area around your surgical cuts (incisions) whenever your bandages  (dressings) are changed. Look for any redness or swelling. °· You will need to return to have the stitches (sutures) or staples taken out. Ask your surgeon when to do that. °· Ask your surgeon when you can shower or bathe. °Activity °· Try to keep your legs raised when you are sitting. °· Do any exercises your health care providers have given you. These may include deep breathing exercises, coughing, walking, or other exercises. °SEEK MEDICAL CARE IF: °· You have any questions about your medicines. °· You have more leg pain, especially if your pain medicine stops working. °· New or growing bruises develop on your leg. °· Your leg swells, feels tight, or becomes red. °· You have numbness in your leg. °SEEK IMMEDIATE MEDICAL CARE IF: °· Your pain gets much worse. °· Blood or fluid leaks from any of the incisions. °· Your incisions become warm, swollen, or red. °· You have chest pain. °· You have trouble breathing. °· You have a fever. °· You have more pain near your leg incision. °MAKE SURE YOU: °· Understand these instructions. °· Will watch your condition. °· Will get help right away if you are not doing well or get worse. °Document Released: 01/23/2011 Document Revised: 05/18/2013 Document Reviewed:   01/23/2011 ExitCare Patient Information 2015 [place]. This information is not intended to replace advice given to you by your health care provider. Make sure you discuss any questions you have with your health care provider.    Information on my medicine - Coumadin   (Warfarin)  This medication education was reviewed with me or my healthcare representative as part of my discharge preparation.  The pharmacist that spoke with me during my hospital stay was:  Cleon DewDulaney, [place], [facility]  Why was Coumadin prescribed for you? Coumadin was prescribed for you because you have a blood clot or a medical condition that can cause an increased risk of forming blood clots. Blood clots can cause serious health  problems by blocking the flow of blood to the heart, lung, or brain. Coumadin can prevent harmful blood clots from forming. As a reminder your indication for Coumadin is:   Select from menu  What test will check on my response to Coumadin? While on Coumadin (warfarin) you will need to have an INR test regularly to ensure that your dose is keeping you in the desired range. The INR (international normalized ratio) number is calculated from the result of the laboratory test called prothrombin time (PT).  If an INR APPOINTMENT HAS NOT ALREADY BEEN MADE FOR YOU please schedule an appointment to have this lab work done by your health care provider within 7 days. Your INR goal is usually a number between:  2 to 3 or your provider may give you a more narrow range like 2-2.5.  Ask your health care provider during an office visit what your goal INR is.  What  do you need to  know  About  COUMADIN? Take Coumadin (warfarin) exactly as prescribed by your healthcare provider about the same time each day.  DO NOT stop taking without talking to the doctor who prescribed the medication.  Stopping without other blood clot prevention medication to take the place of Coumadin may increase your risk of developing a new clot or stroke.  Get refills before you run out.  What do you do if you miss a dose? If you miss a dose, take it as soon as you remember on the same day then continue your regularly scheduled regimen the next day.  Do not take two doses of Coumadin at the same time.  Important Safety Information A possible side effect of Coumadin (Warfarin) is an increased risk of bleeding. You should call your healthcare provider right away if you experience any of the following:   Bleeding from an injury or your nose that does not stop.   Unusual colored urine (red or dark brown) or unusual colored stools (red or black).   Unusual bruising for unknown reasons.   A serious fall or if you hit your head (even if there is  no bleeding).  Some foods or medicines interact with Coumadin (warfarin) and might alter your response to warfarin. To help avoid this:   Eat a balanced diet, maintaining a consistent amount of Vitamin K.   Notify your provider about major diet changes you plan to make.   Avoid alcohol or limit your intake to 1 drink for women and 2 drinks for men per day. (1 drink is 5 oz. wine, 12 oz. beer, or 1.5 oz. liquor.)  Make sure that ANY health care provider who prescribes medication for you knows that you are taking Coumadin (warfarin).  Also make sure the healthcare provider who is monitoring your Coumadin knows when  you have started a new medication including herbals and non-prescription products.  Coumadin (Warfarin)  Major Drug Interactions  Increased Warfarin Effect Decreased Warfarin Effect  Alcohol (large quantities) Antibiotics (esp. Septra/Bactrim, Flagyl, Cipro) Amiodarone (Cordarone) Aspirin (ASA) Cimetidine (Tagamet) Megestrol (Megace) NSAIDs (ibuprofen, naproxen, etc.) Piroxicam (Feldene) Propafenone (Rythmol SR) Propranolol (Inderal) Isoniazid (INH) Posaconazole (Noxafil) Barbiturates (Phenobarbital) Carbamazepine (Tegretol) Chlordiazepoxide (Librium) Cholestyramine (Questran) Griseofulvin Oral Contraceptives Rifampin Sucralfate (Carafate) Vitamin K   Coumadin (Warfarin) Major Herbal Interactions  Increased Warfarin Effect Decreased Warfarin Effect  Garlic Ginseng Ginkgo biloba Coenzyme Q10 Green tea [place]. Johns wort    Coumadin (Warfarin) FOOD Interactions  Eat a consistent number of servings per week of foods HIGH in Vitamin K (1 serving =  cup)  Collards (cooked, or boiled & drained) Kale (cooked, or boiled & drained) Mustard greens (cooked, or boiled & drained) Parsley *serving size only =  cup Spinach (cooked, or boiled & drained) Swiss chard (cooked, or boiled & drained) Turnip greens (cooked, or boiled & drained)  Eat a consistent number of  servings per week of foods MEDIUM-HIGH in Vitamin K (1 serving = 1 cup)  Asparagus (cooked, or boiled & drained) Broccoli (cooked, boiled & drained, or raw & chopped) Brussel sprouts (cooked, or boiled & drained) *serving size only =  cup Lettuce, raw (green leaf, endive, romaine) Spinach, raw Turnip greens, raw & chopped   These websites have more information on Coumadin (warfarin):  http://www.king-russell.com/www.coumadin.com; https://www.hines.net/www.ahrq.gov/consumer/coumadin.htm;

## 2013-11-19 NOTE — Progress Notes (Signed)
dc'ed pacing wires pt. tolerated well 

## 2013-11-19 NOTE — Progress Notes (Signed)
Physical Therapy Treatment Patient Details Name: CALANDRA MADURA MRN: [id number] DOB: 02/12/1930 Today's Date: November 28, 2013    History of Present Illness 80 year old female who is s/p mitral valve ring repair and CABGx3 on 6/18. Pt extubated 6/21. PMH - CAD, MI, DM    PT Comments    Pt doing very well with mobility.  Follow Up Recommendations  Home health PT;Supervision - Intermittent     Equipment Recommendations  Rolling walker with 5" wheels    Recommendations for Other Services       Precautions / Restrictions Precautions Precautions: Sternal;Fall    Mobility  Bed Mobility Overal bed mobility: Needs Assistance Bed Mobility: Supine to Sit     Supine to sit: Supervision;HOB elevated     General bed mobility comments: Able to sit up and get hips to edge of bed without physical assist.  Transfers Overall transfer level: Needs assistance Equipment used: Rolling walker (2 wheeled) Transfers: Sit to/from Stand Sit to Stand: Supervision         General transfer comment: Able to stand with hands on knees or with hands at sides  Ambulation/Gait Ambulation/Gait assistance: Supervision Ambulation Distance (Feet): 350 Feet Assistive device: Rolling walker (2 wheeled) Gait Pattern/deviations: Step-through pattern;Decreased stride length         Stairs            Wheelchair Mobility    Modified Rankin (Stroke Patients Only)       Balance   Sitting-balance support: No upper extremity supported Sitting balance-Leahy Scale: Good     Standing balance support: No upper extremity supported Standing balance-Leahy Scale: Good                      Cognition Arousal/Alertness: Awake/alert Behavior During Therapy: WFL for tasks assessed/performed Overall Cognitive Status: Within Functional Limits for tasks assessed                      Exercises      General Comments        Pertinent Vitals/Pain VSS    Home Living                      Prior Function            PT Goals (current goals can now be found in the care plan section) Acute Rehab PT Goals Patient Stated Goal: Return home and work in the yard. PT Goal Formulation: With patient Time For Goal Achievement: 11/26/13 Potential to Achieve Goals: Good Progress towards PT goals: Goals met and updated - see care plan    Frequency  Min 3X/week    PT Plan Current plan remains appropriate    Co-evaluation             End of Session Equipment Utilized During Treatment: Gait belt Activity Tolerance: Patient tolerated treatment well Patient left: in chair;with call bell/phone within reach;with chair alarm set     Time: 4656-8127 PT Time Calculation (min): 24 min  Charges:  $Gait Training: 23-37 mins                    G Codes:      MAYCOCK,CARY 11-28-2013, 4:31 PM  [facility] [phone]

## 2013-11-20 LAB — GLUCOSE, CAPILLARY
GLUCOSE-CAPILLARY: 149 mg/dL — AB (ref 70–99)
GLUCOSE-CAPILLARY: 146 mg/dL — AB (ref 70–99)
Glucose-Capillary: 139 mg/dL — ABNORMAL HIGH (ref 70–99)
Glucose-Capillary: 154 mg/dL — ABNORMAL HIGH (ref 70–99)

## 2013-11-20 LAB — PROTIME-INR
INR: 1.81 — ABNORMAL HIGH (ref 0.00–1.49)
PROTHROMBIN TIME: 21 s — AB (ref 11.6–15.2)

## 2013-11-20 MED ORDER — AMIODARONE HCL 200 MG PO TABS: 200.0000 mg | ORAL_TABLET | Freq: Two times a day (BID) | ORAL | Status: DC

## 2013-11-20 MED ORDER — POTASSIUM CHLORIDE CRYS ER 20 MEQ PO TBCR: 20.0000 meq | EXTENDED_RELEASE_TABLET | Freq: Two times a day (BID) | ORAL | Status: DC

## 2013-11-20 MED ORDER — WARFARIN SODIUM 2 MG PO TABS: 2.0000 mg | ORAL_TABLET | Freq: Every day | ORAL | Status: DC

## 2013-11-20 MED ORDER — ASPIRIN EC 81 MG PO TBEC
81.0000 mg | DELAYED_RELEASE_TABLET | Freq: Every day | ORAL | Status: DC
  Administered 2013-11-20 – 2013-11-21 (×2): 81 mg via ORAL
  Filled 2013-11-20 (×2): qty 1

## 2013-11-20 MED ORDER — FUROSEMIDE 40 MG PO TABS: 40.0000 mg | ORAL_TABLET | Freq: Every day | ORAL | Status: DC

## 2013-11-20 MED ORDER — METOPROLOL TARTRATE 25 MG PO TABS
12.5000 mg | ORAL_TABLET | Freq: Two times a day (BID) | ORAL | Status: DC
Start: 2013-11-20 — End: 2013-11-21

## 2013-11-20 MED ORDER — OXYCODONE HCL 5 MG PO TABS: 5.0000 mg | ORAL_TABLET | ORAL | Status: DC | PRN

## 2013-11-20 MED ORDER — METOPROLOL TARTRATE 25 MG PO TABS
25.0000 mg | ORAL_TABLET | Freq: Two times a day (BID) | ORAL | Status: DC
  Administered 2013-11-20 – 2013-11-21 (×3): 25 mg via ORAL
  Filled 2013-11-20 (×4): qty 1

## 2013-11-20 NOTE — Progress Notes (Addendum)
      [address]       [place] [id number]             [phone]      9 Days Post-Op Procedure(s) (LRB): MITRAL VALVE Ring repair (MVR)/CORONARY ARTERY BYPASS GRAFTING (CABG) times three using left internal mammary and left sphenous vein. (N/A)  Subjective:  [name] has no complaints this morning.  She states she wants to go home today. + ambulation + BM  Objective: Vital signs in last 24 hours: Temp:  [97.5 F (36.4 C)-98.6 F (37 C)] 98.6 F (37 C) (06/27 0418) Pulse Rate:  [80-89] 80 (06/27 0418) Cardiac Rhythm:  [-] Heart block (06/26 2000) Resp:  [17-20] 19 (06/27 0418) BP: (110-122)/(55-71) 115/65 mmHg (06/27 0418) SpO2:  [92 %-97 %] 92 % (06/27 0418) Weight:  [133 lb 3.2 oz (60.419 kg)] 133 lb 3.2 oz (60.419 kg) (06/27 0418)  Intake/Output from previous day: 06/26 0701 - 06/27 0700 In: 610 [P.O.:600; I.V.:10] Out: -   General appearance: alert, cooperative and no distress Heart: regular rate and rhythm Lungs: clear to auscultation bilaterally Abdomen: soft, non-tender; bowel sounds normal; no masses,  no organomegaly Extremities: edema trace Wound: clean and dry  Lab Results:  Recent Labs  11/18/13 0350 11/19/13 0630  WBC 12.2* 9.2  HGB 9.8* 9.7*  HCT 30.4* 30.2*  PLT 192 207   BMET:  Recent Labs  11/18/13 0350 11/19/13 0630  NA 142 143  K 3.1* 3.3*  CL 101 99  CO2 26 28  GLUCOSE 78 141*  BUN 31* 26*  CREATININE 1.26* 1.20*  CALCIUM 8.8 8.9    PT/INR:  Recent Labs  11/20/13 0435  LABPROT 21.0*  INR 1.81*   ABG    Component Value Date/Time   PHART 7.447 11/14/2013 1349   HCO3 20.0 11/14/2013 1349   TCO2 21 11/14/2013 1349   ACIDBASEDEF 3.0* 11/14/2013 1349   O2SAT 59.1 11/16/2013 0500   CBG (last 3)   Recent Labs  11/19/13 1621 11/19/13 2141 11/20/13 0609  GLUCAP 93 147* 149*    Assessment/Plan: S/P Procedure(s) (LRB): MITRAL VALVE Ring repair (MVR)/CORONARY ARTERY BYPASS GRAFTING (CABG) times three  using left internal mammary and left sphenous vein. (N/A)  1. CV- NSR, good pressure and rate control- continue Lopressor, Amiodarone 2. INR 1.81- continue Coumadin at 2 mg daily 3. Renal- creatinine is at baseline, volume status continues to improve, weight up 4 lbs since admission, continue Lasix 4. PVD- ischemic changes in toes, stable 5. DM- sugars controlled, on Metformin at home 6. Dispo- patient is stable, maintaining NSR, H/H arranged, patient wants to be discharged home today, will discuss with staff    LOS: 12 days    BARRETT, ERIN 11/20/2013  patient examined and medical record reviewed,agree with above note. VAN TRIGT III,Emily Daniel 11/20/2013

## 2013-11-20 NOTE — Progress Notes (Signed)
Subjective:  Day 9 s/p CABG x3 and MR repair with annuloplasty ring.  Objective:   Vital Signs in the last 24 hours: Temp:  [97.5 F (36.4 C)-98.6 F (37 C)] 98.6 F (37 C) (06/27 0418) Pulse Rate:  [80-89] 80 (06/27 0418) Resp:  [17-20] 19 (06/27 0418) BP: (110-122)/(55-71) 115/65 mmHg (06/27 0418) SpO2:  [92 %-97 %] 92 % (06/27 0418) Weight:  [133 lb 3.2 oz (60.419 kg)] 133 lb 3.2 oz (60.419 kg) (06/27 0418)  Intake/Output from previous day: 06/26 0701 - 06/27 0700 In: 610 [P.O.:600; I.V.:10] Out: -   Medications: . amiodarone  200 mg Oral Q12H   Followed by  . [START ON 11/24/2013] amiodarone  200 mg Oral Daily  . aspirin EC  81 mg Oral Daily  . docusate sodium  200 mg Oral Daily  . enoxaparin (LOVENOX) injection  30 mg Subcutaneous Q24H  . furosemide  40 mg Oral Daily  . insulin aspart  0-5 Units Subcutaneous QHS  . insulin aspart  0-9 Units Subcutaneous TID WC  . metoprolol tartrate  12.5 mg Oral BID  . moving right along book   Does not apply Once  . pantoprazole  40 mg Oral Daily  . potassium chloride  20 mEq Oral BID  . simvastatin  20 mg Oral q1800  . sodium chloride  3 mL Intravenous Q12H  . warfarin  2 mg Oral q1800  . Warfarin - Physician Dosing Inpatient   Does not apply q1800       Physical Exam:   General appearance: alert, cooperative and no distress Neck: no adenopathy, no JVD, supple, symmetrical, trachea midline and thyroid not enlarged, symmetric, no tenderness/mass/nodules Lungs: clear to auscultation bilaterally Heart: regular rate and rhythm Abdomen: soft, non-tender; bowel sounds normal; no masses,  no organomegaly Extremities: no edema, redness or tenderness in the calves or thighs Neurologic: Grossly normal   Rate: 90  Rhythm: normal sinus rhythm  Lab Results:   Recent Labs  11/18/13 0350 11/19/13 0630  NA 142 143  K 3.1* 3.3*  CL 101 99  CO2 26 28  GLUCOSE 78 141*  BUN 31* 26*  CREATININE 1.26* 1.20*    No results  found for this basename: TROPONINI, CK, MB,  in the last 72 hours  Hepatic Function Panel No results found for this basename: PROT, ALBUMIN, AST, ALT, ALKPHOS, BILITOT, BILIDIR, IBILI,  in the last 72 hours  Recent Labs  11/20/13 0435  INR 1.81*   BNP (last 3 results)  Recent Labs  11/08/13 1030  PROBNP 4174.0*    Lipid Panel     Component Value Date/Time   CHOL 121 09/30/2013 0921   TRIG 76.0 09/30/2013 0921   HDL 44.40 09/30/2013 0921   CHOLHDL 3 09/30/2013 0921   VLDL 15.2 09/30/2013 0921   LDLCALC 61 09/30/2013 0921      Imaging:  Dg Chest 2 View  11/19/2013   CLINICAL DATA:  Effusions post CABG.  EXAM: CHEST  2 VIEW  COMPARISON:  11/18/2013  FINDINGS: Sternotomy wires unchanged. Left-sided PICC line has been pulled back slightly with tip overlying the SVC at the level of the carina with slight oblique orientation. Lungs are adequately inflated with interval improvement of a small left effusion likely with associated basilar atelectasis in the retrocardiac region. Linear atelectasis left midlung. Slight worsening small right pleural effusion. Stable cardiomegaly. Remainder the exam is unchanged.  IMPRESSION: Interval improvement in a small left pleural effusion with associated left basilar atelectasis.  Linear atelectasis left midlung. Slight worsening small right pleural effusion likely with associated atelectasis.  Stable cardiomegaly.   Electronically Signed   By: Elberta Fortisaniel  Boyle M.D.   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 INR 1.81 today. I/O -5392 since admission. K 3.3 yesterday; will re-check. Resting pulsi in the 90's. Will  titrate lopressor as BP allows.   Lennette Biharihomas A. Kelly, MD, [facility] HospitalFACC 11/20/2013, 9:41 AM

## 2013-11-20 NOTE — Progress Notes (Signed)
CARDIAC REHAB PHASE I   PRE:  Rate/Rhythm: 85 sinus  BP:  Supine:   Sitting: 106/48  Standing: 115/43   SaO2: 94 RA  MODE:  Ambulation: 350 ft   POST:  Rate/Rhythem: 90  BP:  Supine:   Sitting: 117/58  Standing:    SaO2: 99 RA  Pt ambulated 350 ft with assist x1 using rolling walker.  Pt tolerated walk well without complaint.  She had no f/u questions from yesterday's education.  Pt returned to chair with call bell and phone in reach. [name], MA, ACSM RCEP [phone]  [name], [name]

## 2013-11-20 NOTE — Progress Notes (Signed)
Pt ambulated 350 ft with RW requiring only coaching for sit to stand.  No complaints, only stating "I get tired pretty easy."  To bathroom after walk then assisted to chair with call bell and phone in reach.  Will con't plan of care.

## 2013-11-21 LAB — BASIC METABOLIC PANEL
BUN: 29 mg/dL — AB (ref 6–23)
CALCIUM: 8.7 mg/dL (ref 8.4–10.5)
CO2: 29 mEq/L (ref 19–32)
Chloride: 97 mEq/L (ref 96–112)
Creatinine, Ser: 1.2 mg/dL — ABNORMAL HIGH (ref 0.50–1.10)
GFR calc Af Amer: 48 mL/min — ABNORMAL LOW (ref >90)
GFR, EST NON AFRICAN AMERICAN: 41 mL/min — AB (ref >90)
Glucose, Bld: 131 mg/dL — ABNORMAL HIGH (ref 70–99)
Potassium: 3.3 mEq/L — ABNORMAL LOW (ref 3.7–5.3)
Sodium: 141 mEq/L (ref 137–147)

## 2013-11-21 LAB — GLUCOSE, CAPILLARY: Glucose-Capillary: 137 mg/dL — ABNORMAL HIGH (ref 70–99)

## 2013-11-21 LAB — PROTIME-INR
INR: 2.24 — ABNORMAL HIGH (ref 0.00–1.49)
Prothrombin Time: 24.8 seconds — ABNORMAL HIGH (ref 11.6–15.2)

## 2013-11-21 MED ORDER — METOPROLOL TARTRATE 25 MG PO TABS
25.0000 mg | ORAL_TABLET | Freq: Two times a day (BID) | ORAL | Status: DC
Start: 2013-11-21 — End: 2014-03-08

## 2013-11-21 NOTE — Progress Notes (Addendum)
      [address]       [place] [id number]             [phone]      10 Days Post-Op Procedure(s) (LRB): MITRAL VALVE Ring repair (MVR)/CORONARY ARTERY BYPASS GRAFTING (CABG) times three using left internal mammary and left sphenous vein. (N/A)  Subjective:  Emily Daniel has no complaints this morning.  She is up moving around the room, hopeful to go home today. Objective: Vital signs in last 24 hours: Temp:  [97.8 F (36.6 C)-98.9 F (37.2 C)] 98.1 F (36.7 C) (06/28 0500) Pulse Rate:  [76-85] 76 (06/28 0500) Cardiac Rhythm:  [-] Heart block (06/28 0720) Resp:  [17-18] 17 (06/28 0500) BP: (118-127)/(62-72) 123/72 mmHg (06/28 0500) SpO2:  [90 %-94 %] 90 % (06/28 0500) Weight:  [132 lb 15 oz (60.3 kg)] 132 lb 15 oz (60.3 kg) (06/28 0500)  Intake/Output from previous day: 06/27 0701 - 06/28 0700 In: 20 [I.V.:20] Out: 225 [Urine:225]  General appearance: alert, cooperative and no distress Heart: regular rate and rhythm Lungs: clear to auscultation bilaterally Abdomen: soft, non-tender; bowel sounds normal; no masses,  no organomegaly Extremities: edema 1+ Wound: clean and dry  Lab Results:  Recent Labs  11/19/13 0630  WBC 9.2  HGB 9.7*  HCT 30.2*  PLT 207   BMET:  Recent Labs  11/19/13 0630 11/21/13 0525  NA 143 141  K 3.3* 3.3*  CL 99 97  CO2 28 29  GLUCOSE 141* 131*  BUN 26* 29*  CREATININE 1.20* 1.20*  CALCIUM 8.9 8.7    PT/INR:  Recent Labs  11/21/13 0525  LABPROT 24.8*  INR 2.24*   ABG    Component Value Date/Time   PHART 7.447 11/14/2013 1349   HCO3 20.0 11/14/2013 1349   TCO2 21 11/14/2013 1349   ACIDBASEDEF 3.0* 11/14/2013 1349   O2SAT 59.1 11/16/2013 0500   CBG (last 3)   Recent Labs  11/20/13 1658 11/20/13 2152 11/21/13 0635  GLUCAP 139* 154* 137*    Assessment/Plan: S/P Procedure(s) (LRB): MITRAL VALVE Ring repair (MVR)/CORONARY ARTERY BYPASS GRAFTING (CABG) times three using left internal mammary and  left sphenous vein. (N/A)  1. CV- NSR good rate and pressure control- continue Lopressor, Amiodarone 2. INR 2.24 this morning, on 2 mg of Coumadin 3. Renal- creatinine is stable, patient volume overloaded, continue Lasix, hypokalemic, continue supplements 4. PVD- remains stable 5. DM- sugars stable 6. Dispo- patient doing well, will d/c home today   LOS: 13 days    BARRETT, ERIN 11/21/2013  patient examined and medical record reviewed,agree with above note. VAN TRIGT III,PETER 11/21/2013

## 2013-11-22 ENCOUNTER — Ambulatory Visit (INDEPENDENT_AMBULATORY_CARE_PROVIDER_SITE_OTHER): Payer: Medicare Other | Admitting: Pharmacist

## 2013-11-22 ENCOUNTER — Telehealth: Payer: Self-pay | Admitting: *Deleted

## 2013-11-22 DIAGNOSIS — I4891 Unspecified atrial fibrillation: Secondary | ICD-10-CM

## 2013-11-22 DIAGNOSIS — I059 Rheumatic mitral valve disease, unspecified: Secondary | ICD-10-CM

## 2013-11-22 LAB — POCT INR: INR: 3.6

## 2013-11-22 NOTE — Telephone Encounter (Signed)
[facility] and she  requested that she do INR while they are in the home and order given that they may do INR  Next one is to be done on July 6th and she states understanding

## 2013-11-29 ENCOUNTER — Ambulatory Visit (INDEPENDENT_AMBULATORY_CARE_PROVIDER_SITE_OTHER): Payer: Medicare Other | Admitting: Interventional Cardiology

## 2013-11-29 DIAGNOSIS — I059 Rheumatic mitral valve disease, unspecified: Secondary | ICD-10-CM

## 2013-11-29 LAB — PROTIME-INR: INR: 3.6 — AB (ref <=1.1)

## 2013-12-02 ENCOUNTER — Encounter: Payer: Self-pay | Admitting: *Deleted

## 2013-12-02 NOTE — Progress Notes (Signed)
Patient ID: Emily Daniel, female   DOB: 1929/09/25, 80 y.o.   MRN: [id number] Today the home health nurse called to relate that Ms. Hovater had had three episodes of blood tinged phlegm...one with bright red blood, others with dark blood. I called Ms. Deas this afternoon and she had not had anymore episodes.  The home health nurse will be checking her INR on Monday. I will call her tomorrow to check on her status.

## 2013-12-06 ENCOUNTER — Other Ambulatory Visit: Payer: Self-pay | Admitting: *Deleted

## 2013-12-06 ENCOUNTER — Ambulatory Visit (INDEPENDENT_AMBULATORY_CARE_PROVIDER_SITE_OTHER): Payer: Medicare Other | Admitting: Internal Medicine

## 2013-12-06 DIAGNOSIS — I059 Rheumatic mitral valve disease, unspecified: Secondary | ICD-10-CM

## 2013-12-06 LAB — PROTIME-INR: INR: 4.2 — AB (ref 0.9–1.1)

## 2013-12-06 LAB — POCT INR: INR: 4.8

## 2013-12-09 DIAGNOSIS — I2510 Atherosclerotic heart disease of native coronary artery without angina pectoris: Secondary | ICD-10-CM

## 2013-12-09 DIAGNOSIS — Z48812 Encounter for surgical aftercare following surgery on the circulatory system: Secondary | ICD-10-CM

## 2013-12-13 ENCOUNTER — Ambulatory Visit (INDEPENDENT_AMBULATORY_CARE_PROVIDER_SITE_OTHER): Payer: Medicare Other | Admitting: Interventional Cardiology

## 2013-12-13 ENCOUNTER — Other Ambulatory Visit: Payer: Self-pay | Admitting: Thoracic Surgery (Cardiothoracic Vascular Surgery)

## 2013-12-13 ENCOUNTER — Ambulatory Visit: Payer: Medicare Other | Admitting: Family Medicine

## 2013-12-13 DIAGNOSIS — I059 Rheumatic mitral valve disease, unspecified: Secondary | ICD-10-CM

## 2013-12-13 DIAGNOSIS — I4891 Unspecified atrial fibrillation: Secondary | ICD-10-CM

## 2013-12-13 LAB — POCT INR: INR: 2.5

## 2013-12-14 ENCOUNTER — Encounter: Payer: Self-pay | Admitting: Thoracic Surgery (Cardiothoracic Vascular Surgery)

## 2013-12-14 ENCOUNTER — Ambulatory Visit (INDEPENDENT_AMBULATORY_CARE_PROVIDER_SITE_OTHER): Payer: Self-pay | Admitting: Thoracic Surgery (Cardiothoracic Vascular Surgery)

## 2013-12-14 ENCOUNTER — Ambulatory Visit
Admission: RE | Admit: 2013-12-14 | Discharge: 2013-12-14 | Disposition: A | Discharge status: Home / Self Care | Payer: Medicare Other | Source: Ambulatory Visit | Attending: Thoracic Surgery (Cardiothoracic Vascular Surgery) | Admitting: Thoracic Surgery (Cardiothoracic Vascular Surgery)

## 2013-12-14 VITALS — BP 133/78 | HR 83 | Resp 20 | Ht 60.0 in | Wt 124.0 lb

## 2013-12-14 DIAGNOSIS — Q2111 Secundum atrial septal defect: Secondary | ICD-10-CM

## 2013-12-14 DIAGNOSIS — Q2112 Patent foramen ovale: Secondary | ICD-10-CM

## 2013-12-14 DIAGNOSIS — I059 Rheumatic mitral valve disease, unspecified: Secondary | ICD-10-CM

## 2013-12-14 DIAGNOSIS — Z9889 Other specified postprocedural states: Secondary | ICD-10-CM

## 2013-12-14 DIAGNOSIS — Q211 Atrial septal defect: Secondary | ICD-10-CM

## 2013-12-14 DIAGNOSIS — I2510 Atherosclerotic heart disease of native coronary artery without angina pectoris: Secondary | ICD-10-CM

## 2013-12-14 DIAGNOSIS — I340 Nonrheumatic mitral (valve) insufficiency: Secondary | ICD-10-CM

## 2013-12-14 DIAGNOSIS — Z951 Presence of aortocoronary bypass graft: Secondary | ICD-10-CM

## 2013-12-14 NOTE — Progress Notes (Signed)
HPI:  Emily Daniel returns today for scheduled postoperative followup visit.  She is an 80 year old woman who presented after an out of hospital MI. She underwent coronary bypass grafting x3 and a mitral valve repair on June 18. She did have several episodes of atrial fibrillation postoperatively and was discharged on Coumadin. She had an episode of hemoptysis after discharge. Her INR was elevated at 4.8 on July 13. Her Coumadin dose was adjusted and yesterday her INR was 2.5. She has not had any further hemoptysis.  She is not having significant incisional pain. She does notice some swelling in her feet and ankles occasionally. This will go down after she elevates her legs. She denies any fevers, chills or sweats. She's not had any anginal-type chest pain or shortness of breath. She is anxious to resume full activities.  Past Medical History  Diagnosis Date  . Hyperlipidemia   . Hypertension   . Hemorrhoids   . AVM (arteriovenous malformation)   . Diabetes mellitus, type 2   . Anxiety   . PVD (peripheral vascular disease)   . Osteoporosis   . Vitamin B12 deficiency   . Cataract       Current Outpatient Prescriptions  Medication Sig Dispense Refill  . amiodarone (PACERONE) 200 MG tablet Take 200 mg by mouth daily.      [name]. aspirin 81 MG tablet Take 81 mg by mouth daily.        . Calcium Carbonate-Vitamin D (CALCIUM-VITAMIN D) 600-200 MG-UNIT CAPS Take by mouth 2 (two) times daily.        [name]. Casanthranol-Docusate Sodium 30-100 MG CAPS Take 1 capsule by mouth daily as needed. As needed for constipation      . glucose blood (ONE TOUCH ULTRA TEST) test strip TEST TWICE DAILY Dx 250.02  100 each  9  . Lancets MISC by Does not apply route.        . metFORMIN (GLUCOPHAGE XR) 500 MG 24 hr tablet 1 tablet (500 mg) by mouth every night.  Recheck labs in 3 months (11/2013).  30 tablet  2  . metoprolol tartrate (LOPRESSOR) 25 MG tablet Take 1 tablet (25 mg total) by mouth 2 (two) times daily.  60  tablet  3  . Multiple Vitamin (MULTIVITAMIN) tablet Take 1 tablet by mouth daily.        . simvastatin (ZOCOR) 40 MG tablet Take 40 mg by mouth daily.      . vitamin B-12 (CYANOCOBALAMIN) 1000 MCG tablet Take 1,000 mcg by mouth daily.        [name]. warfarin (COUMADIN) 2 MG tablet Take 1 tablet (2 mg total) by mouth daily at 6 PM.  30 tablet  3   No current facility-administered medications for this visit.    Physical Exam BP 133/78  Pulse 83  Resp 20  Ht 5' (1.524 m)  Wt 124 lb (56.246 kg)  BMI 24.22 kg/m2  SpO2 98% Elderly woman in no acute distress Alert and oriented x3 with no focal neurologic deficit Cardiac regular rate and rhythm normal S1 and S2, no murmur or rub Lungs clear with equal breath sounds bilaterally Sternum stable, incision healing well Leg incisions healing well Trace to 1+ edema in both feet  Diagnostic Tests: Chest x-ray 12/14/2013 COMPARISON: 11/19/2013  FINDINGS:  The lungs are hyperinflated likely secondary to COPD. There are  trace bilateral pleural effusions. There is no focal consolidation  or pneumothorax. There is stable cardiomegaly. There is evidence of  prior CABG mitral valve  replacement.  The osseous structures are unremarkable.  IMPRESSION:  Trace bilateral pleural effusions, otherwise no active  cardiopulmonary disease.  Electronically Signed  By: Elige Ko  On: 12/14/2013 10:39   Impression: 80 year old woman who is now 4 weeks post coronary bypass x3 and mitral valve repair following an out of hospital MI. She is doing well at this point in time. She has minimal discomfort, no shortness of breath, and only very mild swelling in her feet. I advised her to continue to elevate her feet as needed. If she notices persistent swelling, she may need a low-dose diuretic. I do not think that is necessarily indicated just yet.  I advised her not to lift anything over 10 pounds for another 2 weeks. I also advised her not to drive for another 2  weeks. 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  She questions about her metformin. These questions apparently were initiated by [place] [facility]. She is an appointment with her primary, Dr. Laury Axon, next week. I asked her to discuss those issues with her.  She has a followup appointment with cardiology tomorrow.  Regarding her anticoagulation, I think it would be safest to continue that for 8 weeks postop. Then at that time, if she is not having any additional age of fibrillation, Coumadin can be discontinued from my standpoint. I will defer the final decision on that to cardiology.  Plan: Followup with cardiology and Dr. Laury Axon  I will be happy to see her back at any time if I can be of any further assistance with her care

## 2013-12-15 ENCOUNTER — Ambulatory Visit (INDEPENDENT_AMBULATORY_CARE_PROVIDER_SITE_OTHER): Payer: Medicare Other | Admitting: Physician Assistant

## 2013-12-15 ENCOUNTER — Encounter: Payer: Self-pay | Admitting: Physician Assistant

## 2013-12-15 VITALS — BP 130/70 | HR 74 | Ht 60.0 in | Wt 126.0 lb

## 2013-12-15 DIAGNOSIS — I2510 Atherosclerotic heart disease of native coronary artery without angina pectoris: Secondary | ICD-10-CM

## 2013-12-15 DIAGNOSIS — I2589 Other forms of chronic ischemic heart disease: Secondary | ICD-10-CM

## 2013-12-15 DIAGNOSIS — I4891 Unspecified atrial fibrillation: Secondary | ICD-10-CM

## 2013-12-15 DIAGNOSIS — E785 Hyperlipidemia, unspecified: Secondary | ICD-10-CM

## 2013-12-15 DIAGNOSIS — I059 Rheumatic mitral valve disease, unspecified: Secondary | ICD-10-CM

## 2013-12-15 DIAGNOSIS — I10 Essential (primary) hypertension: Secondary | ICD-10-CM

## 2013-12-15 DIAGNOSIS — I255 Ischemic cardiomyopathy: Secondary | ICD-10-CM

## 2013-12-15 DIAGNOSIS — I480 Paroxysmal atrial fibrillation: Secondary | ICD-10-CM

## 2013-12-15 DIAGNOSIS — I340 Nonrheumatic mitral (valve) insufficiency: Secondary | ICD-10-CM

## 2013-12-15 DIAGNOSIS — I5022 Chronic systolic (congestive) heart failure: Secondary | ICD-10-CM

## 2013-12-15 MED ORDER — FUROSEMIDE 20 MG PO TABS: 20.0000 mg | ORAL_TABLET | Freq: Every day | ORAL | Status: DC

## 2013-12-15 MED ORDER — LISINOPRIL 2.5 MG PO TABS: 2.5000 mg | ORAL_TABLET | Freq: Every day | ORAL | Status: DC

## 2013-12-15 MED ORDER — ATORVASTATIN CALCIUM 40 MG PO TABS: 40.0000 mg | ORAL_TABLET | Freq: Every day | ORAL | Status: DC

## 2013-12-15 NOTE — Progress Notes (Signed)
Cardiology Office Note    Date:  12/15/2013   ID:  Emily Daniel, DOB [birth year], MRN [id number]  PCP:  Loreen Freud, DO  Cardiologist:  Dr. Charlton Haws      History of Present Illness: Emily Daniel is a 80 y.o. female with a history of CAD (question of remote MI in the 39s), HTN, HL, diabetes, PAD, AVMs, prior tobacco abuse. She was admitted 6/15-6/28 with a non-STEMI complicated by acute systolic CHF. Cardiac catheterization demonstrated severe 3 vessel CAD with left main involvement and severe mitral regurgitation. EF was 30-35% by echocardiogram. She underwent cardiac catheterization and mitral valve repair as well as closure of PFO by Dr. Dorris Fetch. Postoperative course was complicated by distal emboli to her toes. This was managed conservatively. She had atrial fibrillation. She was placed on amiodarone. She was placed on Coumadin secondary to intermittent atrial fibrillation but was discharged in NSR. She has already followed up with Dr. Dorris Fetch. She returns for follow up.  She seems to be doing well. She denies significant dyspnea with exertion. She is probably NYHA 2-2b.  Chest is still somewhat sore. She denies syncope or dizziness. She denies, PND. She does note some increased ankle swelling. Her weight is up about 2 pounds. She denies cough or wheezing.   Studies:  - LHC (10/2013):  Distal left main 90%, proximal LAD 30%, mid LAD 20%, ostial circumflex 99%, then 80%, mid RCA 99%, EF 30-35%, 3-4+ MR; proximal right iliac occluded, right renal 50%, ostial left iliac 70% >>> CABG/MV repair  - Echo (11/08/13):  EF 30-35%, diffuse hypokinesis, severe MR, mild LAE, PASP 48 mm Hg    Recent Labs: 09/30/2013: HDL Cholesterol by NMR 44.40; LDL (calc) 61  11/08/2013: Pro B Natriuretic peptide (BNP) 4174.0*  11/16/2013: ALT 54*  11/17/2013: TSH 0.942  11/19/2013: Hemoglobin 9.7*  11/21/2013: Creatinine 1.20*; Potassium 3.3*   Wt Readings from Last 3 Encounters:  12/15/13  126 lb (57.153 kg)  12/14/13 124 lb (56.246 kg)  11/21/13 132 lb 15 oz (60.3 kg)     Past Medical History  Diagnosis Date  . Hyperlipidemia   . Hypertension   . Hemorrhoids   . AVM (arteriovenous malformation)   . Diabetes mellitus, type 2   . Anxiety   . PVD (peripheral vascular disease)   . Osteoporosis   . Vitamin B12 deficiency   . Cataract   . CAD (coronary artery disease)     a. NSTEMI 10/2013 >>> LHC (10/2013):  Distal left main 90%, proximal LAD 30%, mid LAD 20%, ostial circumflex 99%, then 80%, mid RCA 99%, EF 30-35%, 3-4+ MR; proximal right iliac occluded, right renal 50%, ostial left iliac 70% >>> CABG/MV repair  . Mitral regurgitation     a. s/p MV repair at time of CABG  . Atrial fibrillation     post op >>> req'd Amio and Coumadin  . Ischemic cardiomyopathy     a. Echo (11/08/13):  EF 30-35%, diffuse hypokinesis, severe MR, mild LAE, PASP 48 mm Hg  . Chronic systolic heart failure     Current Outpatient Prescriptions  Medication Sig Dispense Refill  . amiodarone (PACERONE) 200 MG tablet Take 200 mg by mouth daily.      [name] aspirin 81 MG tablet Take 81 mg by mouth daily.        . Calcium Carbonate-Vitamin D (CALCIUM-VITAMIN D) 600-200 MG-UNIT CAPS Take by mouth 2 (two) times daily.        [name] Sodium 30-100  MG CAPS Take 1 capsule by mouth daily as needed. As needed for constipation      . glucose blood (ONE TOUCH ULTRA TEST) test strip TEST TWICE DAILY Dx 250.02  100 each  9  . Lancets MISC by Does not apply route.        . metFORMIN (GLUCOPHAGE XR) 500 MG 24 hr tablet 1 tablet (500 mg) by mouth every night.  Recheck labs in 3 months (11/2013).  30 tablet  2  . metoprolol tartrate (LOPRESSOR) 25 MG tablet Take 1 tablet (25 mg total) by mouth 2 (two) times daily.  60 tablet  3  . Multiple Vitamin (MULTIVITAMIN) tablet Take 1 tablet by mouth daily.        . simvastatin (ZOCOR) 40 MG tablet Take 40 mg by mouth daily.      . vitamin B-12 (CYANOCOBALAMIN)  1000 MCG tablet Take 1,000 mcg by mouth daily.        [name]. warfarin (COUMADIN) 2 MG tablet Take 2 mg by mouth daily at 6 PM. Take 1 tab M & F and take 1/2 tab T-W-T-S-S       No current facility-administered medications for this visit.    Allergies:   Meperidine hcl and Sulfonamide derivatives   Social History:  The patient  reports that she quit smoking about 29 years ago. Her smoking use included Cigarettes. She has a 36 pack-year smoking history. She has never used smokeless tobacco. She reports that she does not drink alcohol or use illicit drugs.   Family History:  The patient's family history includes Cancer in her brother and mother; Hypertension in her other and sister; Stroke in her other.   ROS:  Please see the history of present illness.   No bleeding.   All other systems reviewed and negative.   PHYSICAL EXAM: VS:  BP 130/70  Pulse 74  Ht 5' (1.524 m)  Wt 126 lb (57.153 kg)  BMI 24.61 kg/m2 Well nourished, well developed, in no acute distress HEENT: normal Neck: no JVD Cardiac:  normal S1, S2; RRR; no murmur Lungs:  clear to auscultation bilaterally, no wheezing, rhonchi or rales Abd: soft, nontender, no hepatomegaly Ext: trace bilateral ankle edema Skin: warm and dry Neuro:  CNs 2-12 intact, no focal abnormalities noted  EKG:  NSR, HR 74, 1st degree AVB (PR 274), lat TWI      ASSESSMENT AND PLAN:  Atherosclerosis of native coronary artery of native heart without angina pectoris She is doing well after recent CABG. Continue aspirin, beta blocker, statin. I will refer her to cardiac rehabilitation.  Mitral regurgitation She is doing well after recent CABG plus mitral valve repair. We discussed the importance of SBE prophylaxis. She will eventually undergo a followup echocardiogram as outlined below.  Paroxysmal atrial fibrillation Maintaining NSR. She remains on amiodarone, Coumadin and metoprolol. She does have a first-degree AV block on ECG. She is not bradycardic  and is not symptomatic. We will likely plan on discontinuing her amiodarone at some point. Hopefully, it can be discontinued at her follow up with Dr. Eden EmmsNishan. She may likely come off Coumadin at some point as well. I suspect we will stop her amiodarone first and plan on discontinuing her Coumadin in follow up if she remains in normal sinus rhythm. I will leave this up to Dr. Eden EmmsNishan.  Ischemic cardiomyopathy Continue beta blocker. Start ACE inhibitor with lisinopril 2.5 mg daily. Check a basic metabolic panel in one week. She will need a follow up  echocardiogram 90 days post bypass. If this continues to demonstrate an EF <35%, consider referral to EP. She may not be a candidate for ICD given his advanced age.  Chronic systolic heart failure She does have some ankle edema. Otherwise, she does not appear to be significantly volume overloaded. I did give her Lasix to take once a day as needed for edema. I asked her to call us if she starts to use Lasix on a scheduled basis.  HLD (hyperlipidemia) She is on amiodarone. Maximum dose of simvastatin should be 10 mg. D/c simvastatin.  Start Lipitor 40 mg daily. She will need lipids and LFTs drawn in 6 weeks.  Essential hypertension  Controlled.   Disposition:  Follow up with Dr. Eden Emms in 4 weeks  Signed, Brynda Rim, MHS 12/15/2013 11:14 AM    [facility] [address] [place], [place], [place]  [address] Phone: [phone]; Fax: [phone]

## 2013-12-15 NOTE — Patient Instructions (Signed)
Your physician has recommended you make the following change in your medication: 1. Start Lisinopril 2.5 MG 1 tablet daily 2. Start Lasix 20 MG daily 3. Stop Simvastatin 4. Start Atorvastatin 40 MG 1 tablet daily.   Your physician recommends that you return for lab work on 7/27 for BMET  Your physician recommends that you return for a FASTING lipid profile and ALT on 02/09/14  You have been referred to Cardiac Rehab. They will be contacting you within the next week to get you started.   Your physician recommends that you schedule a follow-up appointment in: 4-6 Weeks with Dr Eden EmmsNishan

## 2013-12-16 ENCOUNTER — Ambulatory Visit: Payer: Medicare Other | Admitting: Family Medicine

## 2013-12-16 ENCOUNTER — Telehealth: Payer: Self-pay | Admitting: Family Medicine

## 2013-12-16 NOTE — Telephone Encounter (Signed)
These #s are great and it means that the medication is working.  I would not make any changes at this time and pt can discuss her meds at upcoming OV.  But in the meantime, I would try not to worry!

## 2013-12-16 NOTE — Telephone Encounter (Signed)
Dr. Rennis Goldenabori's note was reviewed with patient.  Pt was agreeable with advice.  No further questions or concerns voiced at this time.

## 2013-12-16 NOTE — Telephone Encounter (Signed)
Pt states she has heart failure, a leaky heart valve and recently had bypass surgery on November 10, 2013.  When she was discharged, [facility] came by her home on November 30, 2013 to review her medications and told patient that she should not be on the metformin, because "it could drop her blood sugar too low during the night and cause her not to wake up in the morning."  This has caused great concern for the patient, but she has continued to take the medication for blood sugar management.   Pt has been asymptomatic.  Blood sugars have been ranging from 109-122 in the mornings.  Pt wants to know does she need to continue taking this medication?   Please advise.    Pt has an upcoming appointment on 12/21/13.

## 2013-12-16 NOTE — Telephone Encounter (Signed)
Caller name: Adriona  Call back number:[phone]   Reason for call:  Pt has questions about Rx metFORMIN (GLUCOPHAGE XR) 500 MG 24 hr tablet.  Please call back

## 2013-12-20 ENCOUNTER — Ambulatory Visit (INDEPENDENT_AMBULATORY_CARE_PROVIDER_SITE_OTHER): Payer: Medicare Other

## 2013-12-20 ENCOUNTER — Other Ambulatory Visit (INDEPENDENT_AMBULATORY_CARE_PROVIDER_SITE_OTHER): Payer: Medicare Other

## 2013-12-20 DIAGNOSIS — I255 Ischemic cardiomyopathy: Secondary | ICD-10-CM

## 2013-12-20 DIAGNOSIS — I059 Rheumatic mitral valve disease, unspecified: Secondary | ICD-10-CM

## 2013-12-20 DIAGNOSIS — I2510 Atherosclerotic heart disease of native coronary artery without angina pectoris: Secondary | ICD-10-CM

## 2013-12-20 DIAGNOSIS — I4891 Unspecified atrial fibrillation: Secondary | ICD-10-CM

## 2013-12-20 DIAGNOSIS — I2589 Other forms of chronic ischemic heart disease: Secondary | ICD-10-CM

## 2013-12-20 LAB — BASIC METABOLIC PANEL
BUN: 15 mg/dL (ref 6–23)
CO2: 26 mEq/L (ref 19–32)
CREATININE: 1 mg/dL (ref 0.4–1.2)
Calcium: 8.9 mg/dL (ref 8.4–10.5)
Chloride: 109 mEq/L (ref 96–112)
GFR: 53.93 mL/min — AB (ref >60.00)
Glucose, Bld: 156 mg/dL — ABNORMAL HIGH (ref 70–99)
POTASSIUM: 3.8 meq/L (ref 3.5–5.1)
Sodium: 140 mEq/L (ref 135–145)

## 2013-12-20 LAB — POCT INR: INR: 2.1

## 2013-12-21 ENCOUNTER — Ambulatory Visit (INDEPENDENT_AMBULATORY_CARE_PROVIDER_SITE_OTHER): Payer: Medicare Other | Admitting: Family Medicine

## 2013-12-21 ENCOUNTER — Encounter: Payer: Self-pay | Admitting: Family Medicine

## 2013-12-21 VITALS — BP 120/60 | HR 80 | Temp 98.7°F | Wt 125.4 lb

## 2013-12-21 DIAGNOSIS — IMO0002 Reserved for concepts with insufficient information to code with codable children: Secondary | ICD-10-CM

## 2013-12-21 DIAGNOSIS — E785 Hyperlipidemia, unspecified: Secondary | ICD-10-CM

## 2013-12-21 DIAGNOSIS — E118 Type 2 diabetes mellitus with unspecified complications: Principal | ICD-10-CM

## 2013-12-21 DIAGNOSIS — Z23 Encounter for immunization: Secondary | ICD-10-CM

## 2013-12-21 DIAGNOSIS — I10 Essential (primary) hypertension: Secondary | ICD-10-CM

## 2013-12-21 DIAGNOSIS — E1165 Type 2 diabetes mellitus with hyperglycemia: Secondary | ICD-10-CM

## 2013-12-21 MED ORDER — LANCETS MISC: Status: DC

## 2013-12-21 MED ORDER — METFORMIN HCL ER 500 MG PO TB24: ORAL_TABLET | ORAL | Status: DC

## 2013-12-21 MED ORDER — GLUCOSE BLOOD VI STRP: ORAL_STRIP | Status: DC

## 2013-12-21 NOTE — Patient Instructions (Signed)
Diabetes and Standards of Medical Care Diabetes is complicated. You may find that your diabetes team includes a dietitian, nurse, diabetes educator, eye doctor, and more. To help everyone know what is going on and to help you get the care you deserve, the following schedule of care was developed to help keep you on track. Below are the tests, exams, vaccines, medicines, education, and plans you will need. HbA1c test This test shows how well you have controlled your glucose over the past 2-3 months. It is used to see if your diabetes management plan needs to be adjusted.   It is performed at least 2 times a year if you are meeting treatment goals.  It is performed 4 times a year if therapy has changed or if you are not meeting treatment goals. Blood pressure test  This test is performed at every routine medical visit. The goal is less than 140/90 mm Hg for most people, but 130/80 mm Hg in some cases. Ask your health care provider about your goal. Dental exam  Follow up with the dentist regularly. Eye exam  If you are diagnosed with type 1 diabetes as a child, get an exam upon reaching the age of 37 years or older and have had diabetes for 3-5 years. Yearly eye exams are recommended after that initial eye exam.  If you are diagnosed with type 1 diabetes as an adult, get an exam within 5 years of diagnosis and then yearly.  If you are diagnosed with type 2 diabetes, get an exam as soon as possible after the diagnosis and then yearly. Foot care exam  Visual foot exams are performed at every routine medical visit. The exams check for cuts, injuries, or other problems with the feet.  A comprehensive foot exam should be done yearly. This includes visual inspection as well as assessing foot pulses and testing for loss of sensation.  Check your feet nightly for cuts, injuries, or other problems with your feet. Tell your health care provider if anything is not healing. Kidney function test (urine  microalbumin)  This test is performed once a year.  Type 1 diabetes: The first test is performed 5 years after diagnosis.  Type 2 diabetes: The first test is performed at the time of diagnosis.  A serum creatinine and estimated glomerular filtration rate (eGFR) test is done once a year to assess the level of chronic kidney disease (CKD), if present. Lipid profile (cholesterol, HDL, LDL, triglycerides)  Performed every 5 years for most people.  The goal for LDL is less than 100 mg/dL. If you are at high risk, the goal is less than 70 mg/dL.  The goal for HDL is 40 mg/dL-50 mg/dL for men and 50 mg/dL-60 mg/dL for women. An HDL cholesterol of 60 mg/dL or higher gives some protection against heart disease.  The goal for triglycerides is less than 150 mg/dL. Influenza vaccine, pneumococcal vaccine, and hepatitis B vaccine  The influenza vaccine is recommended yearly.  It is recommended that people with diabetes who are over 24 years old get the pneumonia vaccine. In some cases, two separate shots may be given. Ask your health care provider if your pneumonia vaccination is up to date.  The hepatitis B vaccine is also recommended for adults with diabetes. Diabetes self-management education  Education is recommended at diagnosis and ongoing as needed. Treatment plan  Your treatment plan is reviewed at every medical visit. Document Released: 03/10/2009 Document Revised: 09/27/2013 Document Reviewed: 10/13/2012 [facility] Patient Information 2015 [place],  [employer]. This information is not intended to replace advice given to you by your health care provider. Make sure you discuss any questions you have with your health care provider.  

## 2013-12-21 NOTE — Progress Notes (Signed)
Subjective:    Patient ID: Emily Daniel, female    DOB: May 21, 1930, 80 y.o.   MRN: [id number]  HPI Pt is here with her son because a nurse from the [employer] said she needed to come.  Pt has no complaints.     Review of Systems As above    Past Medical History  Diagnosis Date  . Hyperlipidemia   . Hypertension   . Hemorrhoids   . AVM (arteriovenous malformation)   . Diabetes mellitus, type 2   . Anxiety   . PVD (peripheral vascular disease)   . Osteoporosis   . Vitamin B12 deficiency   . Cataract   . CAD (coronary artery disease)     a. NSTEMI 10/2013 >>> LHC (10/2013):  Distal left main 90%, proximal LAD 30%, mid LAD 20%, ostial circumflex 99%, then 80%, mid RCA 99%, EF 30-35%, 3-4+ MR; proximal right iliac occluded, right renal 50%, ostial left iliac 70% >>> CABG/MV repair  . Mitral regurgitation     a. s/p MV repair at time of CABG  . Atrial fibrillation     post op >>> req'd Amio and Coumadin  . Ischemic cardiomyopathy     a. Echo (11/08/13):  EF 30-35%, diffuse hypokinesis, severe MR, mild LAE, PASP 48 mm Hg  . Chronic systolic heart failure    History   Social History  . Marital Status: Widowed    Spouse Name: N/A    Number of Children: N/A  . Years of Education: N/A   Occupational History  . retired    Social History Main Topics  . Smoking status: Former Smoker -- 1.00 packs/day for 36 years    Types: Cigarettes    Quit date: 02/18/1984  . Smokeless tobacco: Never Used  . Alcohol Use: No  . Drug Use: No  . Sexual Activity: Not Currently   Other Topics Concern  . Not on file   Social History Narrative   Exercise-- yard, house work   Current Outpatient Prescriptions  Medication Sig Dispense Refill  . amiodarone (PACERONE) 200 MG tablet Take 200 mg by mouth daily.      [name] aspirin 81 MG tablet Take 81 mg by mouth daily.        [name] atorvastatin (LIPITOR) 40 MG tablet Take 1 tablet (40 mg total) by mouth daily.  90 tablet  3  . Calcium Carbonate-Vitamin D  (CALCIUM-VITAMIN D) 600-200 MG-UNIT CAPS Take by mouth 2 (two) times daily.        [name] Sodium 30-100 MG CAPS Take 1 capsule by mouth daily as needed. As needed for constipation      . furosemide (LASIX) 20 MG tablet Take 1 tablet (20 mg total) by mouth daily.  90 tablet  3  . glucose blood (ONE TOUCH ULTRA TEST) test strip TEST TWICE DAILY Dx 250.02  100 each  9  . Lancets MISC One touch delica bid  100 each  1  . lisinopril (PRINIVIL,ZESTRIL) 2.5 MG tablet Take 1 tablet (2.5 mg total) by mouth daily.  90 tablet  3  . metFORMIN (GLUCOPHAGE XR) 500 MG 24 hr tablet 1 tablet (500 mg) by mouth every night.  Recheck labs in 3 months (11/2013).  30 tablet  2  . metoprolol tartrate (LOPRESSOR) 25 MG tablet Take 1 tablet (25 mg total) by mouth 2 (two) times daily.  60 tablet  3  . Multiple Vitamin (MULTIVITAMIN) tablet Take 1 tablet by mouth daily.        [name]  vitamin B-12 (CYANOCOBALAMIN) 1000 MCG tablet Take 1,000 mcg by mouth daily.        [name]. warfarin (COUMADIN) 2 MG tablet Take 2 mg by mouth daily at 6 PM. Take 1 tab M & F and take 1/2 tab T-W-T-S-S       No current facility-administered medications for this visit.    Objective:   Physical Exam BP 120/60  Pulse 80  Temp(Src) 98.7 F (37.1 C) (Oral)  Wt 125 lb 6.4 oz (56.881 kg)  SpO2 98% General appearance: alert, cooperative, appears stated age and no distress Throat: lips, mucosa, and tongue normal; teeth and gums normal Neck: no adenopathy, supple, symmetrical, trachea midline and thyroid not enlarged, symmetric, no tenderness/mass/nodules Lungs: clear to auscultation bilaterally Heart: S1, S2 normal Extremities: extremities normal, atraumatic, no cyanosis or edema       Assessment & Plan:  1. Type II or unspecified type diabetes mellitus with unspecified complication, uncontrolled Check labs in Nov-- we will reevaluate then if metformin needs to be con't  Home glucose readings reviewed - metFORMIN (GLUCOPHAGE XR)  500 MG 24 hr tablet; 1 tablet (500 mg) by mouth every night.  Recheck labs in 3 months (11/2013).  Dispense: 30 tablet; Refill: 2 - Lancets MISC; One touch delica bid  Dispense: 100 each; Refill: 1 - glucose blood (ONE TOUCH ULTRA TEST) test strip; TEST TWICE DAILY Dx 250.02  Dispense: 100 each; Refill: 9  2. Other and unspecified hyperlipidemia Check labs  3. Essential hypertension Stable con't meds  4. Need for pneumococcal vaccination   - Pneumococcal conjugate vaccine 13-valent

## 2013-12-21 NOTE — Progress Notes (Signed)
Pre visit review using our clinic review tool, if applicable. No additional management support is needed unless otherwise documented below in the visit note. 

## 2013-12-24 ENCOUNTER — Telehealth: Payer: Self-pay | Admitting: Cardiovascular Disease

## 2013-12-24 ENCOUNTER — Telehealth (HOSPITAL_COMMUNITY): Payer: Self-pay | Admitting: *Deleted

## 2013-12-24 NOTE — Telephone Encounter (Signed)
Spoke with pt.  The first nosebleed occurred while she was eating dinner and was only a few spots on her napkin.  The second time occurred in the bed around 10:30.  She lays flat without a pillow and could feel her nose start to bleed again.  By the time she got to the bathroom, she had a blood clot in her mouth.  It took her about 15-20 minutes to stop the bleed.  She had another nosebleed around 4:30 with the same issue of blood clot in her mouth.  It took around 20 minutes to stop again.  She used a napkin to pack her nose and has not had an issue since.  She did state she had a sneezing episode prior to the first nosebleed.  INR on 7/27 was 2.1.   Likely that sneezing episode caused broken blood vessels that lead to nosebleed.  INR was on the lower side of therapeutic just 4 days ago so doubt it is supratherapeutic at this time.  Bleeding has stopped.  Have asked pt to continue current dose of Coumadin and monitor for any further bleeding.  Instructed on ways to help control bleeding in the future.

## 2013-12-24 NOTE — Telephone Encounter (Signed)
Called pt in regards to referral received for the exercise program.  Pt decline participation at this time but would like to reconsider later in the year.  Pt cited 50.00 copay per session being difficult for pt with present medical bills. Pt felt later she may be in a better position to participate.  Pt advised Medicare gives pt up to one year to participate in cardiac rehab after cardiac event. Alanson Alyarlette Jaleeyah Munce RN, BSN

## 2013-12-24 NOTE — Telephone Encounter (Signed)
New message     Pt states she had a nosebleed 3 times last night and  about 10:30 last night she spit up a big "glob" of dried blood.  She is on coumadin.  Should she come in for a pt/inr check?

## 2013-12-31 ENCOUNTER — Ambulatory Visit (INDEPENDENT_AMBULATORY_CARE_PROVIDER_SITE_OTHER): Payer: Medicare Other | Admitting: Pharmacist

## 2013-12-31 DIAGNOSIS — I059 Rheumatic mitral valve disease, unspecified: Secondary | ICD-10-CM

## 2013-12-31 DIAGNOSIS — I4891 Unspecified atrial fibrillation: Secondary | ICD-10-CM

## 2013-12-31 LAB — POCT INR: INR: 4

## 2014-01-06 ENCOUNTER — Encounter: Payer: Self-pay | Admitting: Physician Assistant

## 2014-01-06 ENCOUNTER — Ambulatory Visit (INDEPENDENT_AMBULATORY_CARE_PROVIDER_SITE_OTHER): Payer: Medicare Other | Admitting: Pharmacist

## 2014-01-06 ENCOUNTER — Ambulatory Visit (INDEPENDENT_AMBULATORY_CARE_PROVIDER_SITE_OTHER): Payer: Medicare Other | Admitting: Physician Assistant

## 2014-01-06 VITALS — BP 139/60 | HR 72 | Ht 60.0 in | Wt 123.0 lb

## 2014-01-06 DIAGNOSIS — I2589 Other forms of chronic ischemic heart disease: Secondary | ICD-10-CM

## 2014-01-06 DIAGNOSIS — I059 Rheumatic mitral valve disease, unspecified: Secondary | ICD-10-CM

## 2014-01-06 DIAGNOSIS — I340 Nonrheumatic mitral (valve) insufficiency: Secondary | ICD-10-CM

## 2014-01-06 DIAGNOSIS — I255 Ischemic cardiomyopathy: Secondary | ICD-10-CM

## 2014-01-06 DIAGNOSIS — I4891 Unspecified atrial fibrillation: Secondary | ICD-10-CM

## 2014-01-06 DIAGNOSIS — I2510 Atherosclerotic heart disease of native coronary artery without angina pectoris: Secondary | ICD-10-CM

## 2014-01-06 DIAGNOSIS — E785 Hyperlipidemia, unspecified: Secondary | ICD-10-CM

## 2014-01-06 DIAGNOSIS — I5022 Chronic systolic (congestive) heart failure: Secondary | ICD-10-CM

## 2014-01-06 DIAGNOSIS — I10 Essential (primary) hypertension: Secondary | ICD-10-CM

## 2014-01-06 LAB — POCT INR: INR: 2

## 2014-01-06 NOTE — Patient Instructions (Signed)
STOP AMIODARONE  Your physician has requested that you have an echocardiogram THIS WILL NEED TO BE DONE 90 DAYS OUT FROM MI; THIS WILL BE 9/18, PT HAS APPT WITH DR. Eden EmmsNISHAN 9/25. Echocardiography is a painless test that uses sound waves to create images of your heart. It provides your doctor with information about the size and shape of your heart and how well your heart's chambers and valves are working. This procedure takes approximately one hour. There are no restrictions for this procedure.

## 2014-01-06 NOTE — Progress Notes (Signed)
Cardiology Office Note    Date:  01/06/2014   ID:  Emily Daniel, DOB [birth year], MRN [id number]  PCP:  Loreen Freud, DO  Cardiologist:  Dr. Charlton Haws      History of Present Illness: Emily Daniel is a 80 y.o. female with a history of CAD (question of remote MI in the 23s), HTN, HL, diabetes, PAD, AVMs, prior tobacco abuse. She was admitted 10/2013 with a NSTEMI c/b acute systolic CHF. Cardiac catheterization demonstrated severe 3 vessel CAD with left main involvement, severe mitral regurgitation and EF was 30-35% by echocardiogram >>> CABG with mitral valve repair as well as closure of PFO by Dr. Dorris Fetch. Postoperative course was complicated by distal emboli to her toes managed conservatively. She had atrial fibrillation and was placed on amiodarone and Coumadin secondary to intermittent atrial fibrillation but was discharged in NSR.   I saw her 12/15/13. ACE inhibitor was added to her medical regimen. She returns for follow up.  She continues to do well.  The patient has some chest soreness, but denies shortness of breath, syncope, orthopnea, PND or significant pedal edema.    Recent Labs: 09/30/2013: HDL Cholesterol by NMR 44.40; LDL (calc) 61  11/08/2013: Pro B Natriuretic peptide (BNP) 4174.0*  11/16/2013: ALT 54*  11/17/2013: TSH 0.942  11/19/2013: Hemoglobin 9.7*  12/20/2013: Creatinine 1.0; Potassium 3.8   Wt Readings from Last 3 Encounters:  01/06/14 123 lb (55.792 kg)  12/21/13 125 lb 6.4 oz (56.881 kg)  12/15/13 126 lb (57.153 kg)     Past Medical History  Diagnosis Date  . Hyperlipidemia   . Hypertension   . Hemorrhoids   . AVM (arteriovenous malformation)   . Diabetes mellitus, type 2   . Anxiety   . PVD (peripheral vascular disease)   . Osteoporosis   . Vitamin B12 deficiency   . Cataract   . CAD (coronary artery disease)     a. NSTEMI 10/2013 >>> LHC (10/2013):  Distal left main 90%, proximal LAD 30%, mid LAD 20%, ostial circumflex 99%, then 80%,  mid RCA 99%, EF 30-35%, 3-4+ MR; proximal right iliac occluded, right renal 50%, ostial left iliac 70% >>> CABG/MV repair  . Mitral regurgitation     a. s/p MV repair at time of CABG  . Atrial fibrillation     post op >>> req'd Amio and Coumadin  . Ischemic cardiomyopathy     a. Echo (11/08/13):  EF 30-35%, diffuse hypokinesis, severe MR, mild LAE, PASP 48 mm Hg  . Chronic systolic heart failure     Current Outpatient Prescriptions  Medication Sig Dispense Refill  . amiodarone (PACERONE) 200 MG tablet Take 200 mg by mouth daily.      [name] aspirin 81 MG tablet Take 81 mg by mouth daily.        [name] atorvastatin (LIPITOR) 40 MG tablet Take 1 tablet (40 mg total) by mouth daily.  90 tablet  3  . Calcium Carbonate-Vitamin D (CALCIUM-VITAMIN D) 600-200 MG-UNIT CAPS Take by mouth 2 (two) times daily.        [name] Sodium 30-100 MG CAPS Take 1 capsule by mouth daily as needed. As needed for constipation      . furosemide (LASIX) 20 MG tablet Take 1 tablet (20 mg total) by mouth daily.  90 tablet  3  . glucose blood (ONE TOUCH ULTRA TEST) test strip TEST TWICE DAILY Dx 250.02  100 each  9  . Lancets MISC One touch delica bid  100 each  1  . lisinopril (PRINIVIL,ZESTRIL) 2.5 MG tablet Take 1 tablet (2.5 mg total) by mouth daily.  90 tablet  3  . metFORMIN (GLUCOPHAGE XR) 500 MG 24 hr tablet 1 tablet (500 mg) by mouth every night.  Recheck labs in 3 months (11/2013).  30 tablet  2  . metoprolol tartrate (LOPRESSOR) 25 MG tablet Take 1 tablet (25 mg total) by mouth 2 (two) times daily.  60 tablet  3  . Multiple Vitamin (MULTIVITAMIN) tablet Take 1 tablet by mouth daily.        . vitamin B-12 (CYANOCOBALAMIN) 1000 MCG tablet Take 1,000 mcg by mouth daily.        [name]. warfarin (COUMADIN) 2 MG tablet Take 2 mg by mouth daily at 6 PM. Take 1 tab M & F and take 1/2 tab T-W-T-S-S       No current facility-administered medications for this visit.    Allergies:   Meperidine hcl and Sulfonamide  derivatives   Social History:  The patient  reports that she quit smoking about 29 years ago. Her smoking use included Cigarettes. She has a 36 pack-year smoking history. She has never used smokeless tobacco. She reports that she does not drink alcohol or use illicit drugs.   Family History:  The patient's family history includes Cancer in her brother and mother; Hypertension in her other and sister; Stroke in her other. There is no history of Heart attack.   ROS:  Please see the history of present illness.     All other systems reviewed and negative.   PHYSICAL EXAM: VS:  BP 139/60  Pulse 72  Ht 5' (1.524 m)  Wt 123 lb (55.792 kg)  BMI 24.02 kg/m2 Well nourished, well developed, in no acute distress HEENT: normal Neck: no JVD Cardiac:  normal S1, S2; RRR; no murmur Lungs:  clear to auscultation bilaterally, no wheezing, rhonchi or rales Abd: soft, nontender, no hepatomegaly Ext: trace bilateral ankle edema Skin: warm and dry Neuro:  CNs 2-12 intact, no focal abnormalities noted   EKG:   NSR, HR 72, rightward axis, NSSTTW changes     ASSESSMENT AND PLAN:  Atherosclerosis of native coronary artery of native heart without angina pectoris:  She continues to do well after recent CABG. Continue aspirin, beta blocker, statin. I have encouraged her to go to cardiac rehabilitation.  Mitral regurgitation s/p MV Repair:  Dr. Dorris FetchHendrickson recommended anticoagulation for total of 8 weeks post op.  Coumadin also continued for AFib.    Paroxysmal atrial fibrillation:  Maintaining NSR. DC Amiodarone. Consider stopping coumadin at FU with Dr. Charlton HawsPeter Nishan.   Ischemic cardiomyopathy:  Continue beta blocker, ACE inhibitor.  Arrange Echo in 01/2014.  EF <35%, consider referral to EP. She may not be a candidate for ICD given advanced age.  Chronic systolic heart failure:  She is NYHA 2.  HLD (hyperlipidemia):  Continue statin.   Essential hypertension:  Controlled.  Disposition:  Follow up with  Dr. Eden EmmsNishan in 01/2014.  Signed, Brynda RimScott Joeph Szatkowski, PA-C, MHS 01/06/2014 10:37 AM    [facility] [address], [place], [place]  [address] Phone: [phone]; Fax: [phone]

## 2014-01-14 ENCOUNTER — Ambulatory Visit (INDEPENDENT_AMBULATORY_CARE_PROVIDER_SITE_OTHER): Payer: Medicare Other | Admitting: Pharmacist

## 2014-01-14 DIAGNOSIS — I059 Rheumatic mitral valve disease, unspecified: Secondary | ICD-10-CM

## 2014-01-14 DIAGNOSIS — I4891 Unspecified atrial fibrillation: Secondary | ICD-10-CM

## 2014-01-14 LAB — POCT INR: INR: 2.4

## 2014-01-27 ENCOUNTER — Ambulatory Visit (INDEPENDENT_AMBULATORY_CARE_PROVIDER_SITE_OTHER): Payer: Medicare Other | Admitting: *Deleted

## 2014-01-27 DIAGNOSIS — I059 Rheumatic mitral valve disease, unspecified: Secondary | ICD-10-CM

## 2014-01-27 DIAGNOSIS — I4891 Unspecified atrial fibrillation: Secondary | ICD-10-CM

## 2014-01-27 LAB — POCT INR: INR: 2.3

## 2014-02-09 ENCOUNTER — Other Ambulatory Visit: Payer: Medicare Other

## 2014-02-10 ENCOUNTER — Telehealth: Payer: Self-pay | Admitting: Family Medicine

## 2014-02-10 NOTE — Telephone Encounter (Signed)
Caller name: Meg Relation to pt: Rn case Production designer, theatre/television/film for [employer] Call back number:[phone]  Ext [phone]   Reason for call:  Rn would like to speak with CMA regarding pt's labs, and diabetes plan.

## 2014-02-10 NOTE — Telephone Encounter (Signed)
Msg left to call the office     KP 

## 2014-02-10 NOTE — Telephone Encounter (Signed)
Msg left for Meg to return my call.      KP

## 2014-02-10 NOTE — Telephone Encounter (Signed)
Meg RN for [facility] [phone] ext [phone]  Meg returned your call

## 2014-02-14 ENCOUNTER — Other Ambulatory Visit: Payer: Self-pay

## 2014-02-14 ENCOUNTER — Ambulatory Visit (HOSPITAL_COMMUNITY): Payer: Medicare Other | Attending: Cardiology | Admitting: Radiology

## 2014-02-14 ENCOUNTER — Ambulatory Visit (INDEPENDENT_AMBULATORY_CARE_PROVIDER_SITE_OTHER): Payer: Medicare Other | Admitting: *Deleted

## 2014-02-14 DIAGNOSIS — I059 Rheumatic mitral valve disease, unspecified: Secondary | ICD-10-CM

## 2014-02-14 DIAGNOSIS — I10 Essential (primary) hypertension: Secondary | ICD-10-CM | POA: Diagnosis not present

## 2014-02-14 DIAGNOSIS — I2510 Atherosclerotic heart disease of native coronary artery without angina pectoris: Secondary | ICD-10-CM | POA: Insufficient documentation

## 2014-02-14 DIAGNOSIS — I2589 Other forms of chronic ischemic heart disease: Secondary | ICD-10-CM

## 2014-02-14 DIAGNOSIS — Z87891 Personal history of nicotine dependence: Secondary | ICD-10-CM | POA: Insufficient documentation

## 2014-02-14 DIAGNOSIS — E785 Hyperlipidemia, unspecified: Secondary | ICD-10-CM | POA: Diagnosis not present

## 2014-02-14 DIAGNOSIS — E119 Type 2 diabetes mellitus without complications: Secondary | ICD-10-CM | POA: Insufficient documentation

## 2014-02-14 LAB — LIPID PANEL
Cholesterol: 121 mg/dL (ref 0–200)
HDL: 47 mg/dL (ref >39.00)
LDL CALC: 55 mg/dL (ref 0–99)
NONHDL: 74
Total CHOL/HDL Ratio: 3
Triglycerides: 95 mg/dL (ref 0.0–149.0)
VLDL: 19 mg/dL (ref 0.0–40.0)

## 2014-02-14 NOTE — Progress Notes (Signed)
Echocardiogram performed.  

## 2014-02-15 ENCOUNTER — Encounter: Payer: Self-pay | Admitting: Physician Assistant

## 2014-02-15 ENCOUNTER — Telehealth: Payer: Self-pay | Admitting: *Deleted

## 2014-02-15 NOTE — Telephone Encounter (Signed)
pt notified about lab results with verbal understanding  

## 2014-02-17 ENCOUNTER — Telehealth: Payer: Self-pay | Admitting: *Deleted

## 2014-02-17 NOTE — Telephone Encounter (Signed)
pt notfied about echo results with verbal understanding, advised to keep her appt 9/25 with Dr. Eden Emms, pt said ok and thank you.

## 2014-02-18 ENCOUNTER — Ambulatory Visit (INDEPENDENT_AMBULATORY_CARE_PROVIDER_SITE_OTHER): Payer: Medicare Other | Admitting: Cardiovascular Disease

## 2014-02-18 ENCOUNTER — Ambulatory Visit (INDEPENDENT_AMBULATORY_CARE_PROVIDER_SITE_OTHER): Payer: Medicare Other | Admitting: Pharmacist

## 2014-02-18 ENCOUNTER — Encounter: Payer: Self-pay | Admitting: Cardiovascular Disease

## 2014-02-18 VITALS — BP 150/62 | HR 70 | Ht 61.5 in | Wt 122.3 lb

## 2014-02-18 DIAGNOSIS — I10 Essential (primary) hypertension: Secondary | ICD-10-CM

## 2014-02-18 DIAGNOSIS — IMO0001 Reserved for inherently not codable concepts without codable children: Secondary | ICD-10-CM

## 2014-02-18 DIAGNOSIS — Z9889 Other specified postprocedural states: Secondary | ICD-10-CM

## 2014-02-18 DIAGNOSIS — I4891 Unspecified atrial fibrillation: Secondary | ICD-10-CM

## 2014-02-18 DIAGNOSIS — I059 Rheumatic mitral valve disease, unspecified: Secondary | ICD-10-CM

## 2014-02-18 DIAGNOSIS — I480 Paroxysmal atrial fibrillation: Secondary | ICD-10-CM

## 2014-02-18 DIAGNOSIS — Z951 Presence of aortocoronary bypass graft: Secondary | ICD-10-CM

## 2014-02-18 DIAGNOSIS — E1165 Type 2 diabetes mellitus with hyperglycemia: Secondary | ICD-10-CM

## 2014-02-18 LAB — POCT INR: INR: 2

## 2014-02-18 NOTE — Assessment & Plan Note (Signed)
Stable with no angina and good activity level.  Continue medical Rx Stern[place] well healed Given age did fill out handicap parking form for her

## 2014-02-18 NOTE — Assessment & Plan Note (Signed)
Post op echo with no residual MR  SBE prophylaxis

## 2014-02-18 NOTE — Patient Instructions (Signed)
Your physician recommends that you schedule a follow-up appointment in:  3 MONTHS WITH  DR NISHAN  Your physician recommends that you continue on your current medications as directed. Please refer to the Current Medication list given to you today.  

## 2014-02-18 NOTE — Assessment & Plan Note (Signed)
Well controlled.  Continue current medications and low sodium Dash type diet.    

## 2014-02-18 NOTE — Assessment & Plan Note (Signed)
Maint NSR  3 months post CABG/MVR  Amiodarone stopped In light of perifpheral emboli continue coumadin for 3 more months

## 2014-02-18 NOTE — Assessment & Plan Note (Signed)
Discussed low carb diet.  Target hemoglobin A1c is 6.5 or less.  Continue current medications.  

## 2014-02-18 NOTE — Progress Notes (Signed)
Patient ID: Emily Daniel, female   DOB: 07-05-1929, 80 y.o.   MRN: [id number] Emily Daniel is a 80 y.o. female with a history of CAD (question of remote MI in the 64s), HTN, HL, diabetes, PAD, AVMs, prior tobacco abuse. She was admitted 10/2013 with a NSTEMI c/b acute systolic CHF. Cardiac catheterization demonstrated severe 3 vessel CAD with left main involvement, severe mitral regurgitation and EF was 30-35% by echocardiogram >>> CABG with mitral valve repair as well as closure of PFO by Dr. Dorris Fetch. Postoperative course was complicated by distal emboli to her toes managed conservatively. She had atrial fibrillation and was placed on amiodarone and Coumadin secondary to intermittent atrial fibrillation but was discharged in NSR.  I saw her 12/15/13. ACE inhibitor was added to her medical regimen. She returns for follow up. She continues to do well.   F/U echo 02/14/14   Reviewed and EF improved to 40-45%  Study Conclusions  - Left ventricle: The cavity size was normal. Wall thickness was increased in a pattern of mild LVH. Systolic function was mildly to moderately reduced. The estimated ejection fraction was in the range of 40% to 45%. There is akinesis of the basalinferior myocardium. Doppler parameters are consistent with abnormal left ventricular relaxation (grade 1 diastolic dysfunction). Doppler parameters are consistent with high ventricular filling pressure. - Aortic valve: Cusp separation was reduced. There was trivial regurgitation. - Mitral valve: Prior mitral repair, annular ring. The findings are consistent with mild stenosis. There was mild regurgitation. Valve area by continuity equation (using LVOT flow): 1.03 cm^2. - Left atrium: The atrium was mildly dilated. - Pulmonary arteries: PA peak pressure: 32 mm Hg (S).      ROS: Denies fever, malais, weight loss, blurry vision, decreased visual acuity, cough, sputum, SOB, hemoptysis, pleuritic pain, palpitaitons,  heartburn, abdominal pain, melena, lower extremity edema, claudication, or rash.  All other systems reviewed and negative  General: Affect appropriate Frail elderly female  HEENT: normal Neck supple with no adenopathy JVP normal no bruits no thyromegaly Lungs clear with no wheezing and good diaphragmatic motion Heart:  S1/S2 SEM  murmur, no rub, gallop or click PMI normal Abdomen: benighn, BS positve, no tenderness, no AAA no bruit.  No HSM or HJR Distal pulses intact with no bruits No edema s/p bilateral vein stripping with blue toes bilaterally Neuro non-focal Skin warm and dry No muscular weakness   Current Outpatient Prescriptions  Medication Sig Dispense Refill  . atorvastatin (LIPITOR) 40 MG tablet Take 1 tablet (40 mg total) by mouth daily.  90 tablet  3  . Calcium Carbonate-Vitamin D (CALCIUM-VITAMIN D) 600-200 MG-UNIT CAPS Take by mouth 2 (two) times daily.        [name] Sodium 30-100 MG CAPS Take 1 capsule by mouth daily as needed. As needed for constipation      . furosemide (LASIX) 20 MG tablet Take 1 tablet (20 mg total) by mouth daily.  90 tablet  3  . glucose blood (ONE TOUCH ULTRA TEST) test strip TEST TWICE DAILY Dx 250.02  100 each  9  . Lancets MISC One touch delica bid  100 each  1  . lisinopril (PRINIVIL,ZESTRIL) 2.5 MG tablet Take 1 tablet (2.5 mg total) by mouth daily.  90 tablet  3  . metFORMIN (GLUCOPHAGE XR) 500 MG 24 hr tablet 1 tablet (500 mg) by mouth every night.  Recheck labs in 3 months (11/2013).  30 tablet  2  . metoprolol tartrate (LOPRESSOR) 25 MG  tablet Take 1 tablet (25 mg total) by mouth 2 (two) times daily.  60 tablet  3  . Multiple Vitamin (MULTIVITAMIN) tablet Take 1 tablet by mouth daily.        . vitamin B-12 (CYANOCOBALAMIN) 1000 MCG tablet Take 1,000 mcg by mouth daily.        [name] warfarin (COUMADIN) 2 MG tablet Take 2 mg by mouth daily at 6 PM. Take 1 tab M & F and take 1/2 tab T-W-T-S-S      . aspirin 81 MG tablet Take 81  mg by mouth daily.         No current facility-administered medications for this visit.    Allergies  Meperidine hcl and Sulfonamide derivatives  Electrocardiogram:  SR rate 72 first degree nonspecific ST/T wave changes   Assessment and Plan

## 2014-03-08 ENCOUNTER — Telehealth: Payer: Self-pay | Admitting: Family Medicine

## 2014-03-08 MED ORDER — METOPROLOL TARTRATE 25 MG PO TABS: 25.0000 mg | ORAL_TABLET | Freq: Two times a day (BID) | ORAL | Status: DC

## 2014-03-08 MED ORDER — ATORVASTATIN CALCIUM 40 MG PO TABS: 40.0000 mg | ORAL_TABLET | Freq: Every day | ORAL | Status: DC

## 2014-03-08 NOTE — Telephone Encounter (Signed)
Caller name: Emily Daniel  Relation to pt: self  Call back number: [phone] [phone]   Reason for call:   pt in need of clarification on what blood pressure medication she should be taken metoprolol tartrate (LOPRESSOR) 25 MG tablet & lisinopril (PRINIVIL,ZESTRIL) 2.5 MG tablet

## 2014-03-08 NOTE — Telephone Encounter (Signed)
SCRIPT  SENT  VIA  EPIC  TO [facility]  ON [address]  PT  ALSO  REQUESTED  REFILL ON ATORVASTATIN  SCRIPT SENT  AS WELL./CY

## 2014-03-08 NOTE — Telephone Encounter (Signed)
yest we discussed this at our visist.  She has CAD and BP was up last office visit Continue lopressor 25 bid

## 2014-03-08 NOTE — Telephone Encounter (Signed)
Spoke with patient and she wanted to know if she should continue taking the Metoprolol. She said the surgeon's PA put her on it back in June and she was told to follow up with Cardiology and is unclear as to whether she should continue taking the Metoprolol along with Lisinopril or not, if so, she will need to have a refill. I made her aware I would forward to Cardiologist and call her back with his response.     KP

## 2014-03-14 ENCOUNTER — Telehealth: Payer: Self-pay | Admitting: Cardiovascular Disease

## 2014-03-14 ENCOUNTER — Telehealth: Payer: Self-pay | Admitting: Family Medicine

## 2014-03-14 NOTE — Telephone Encounter (Signed)
Caller name: Meg RN from [facility]   Call back number: [phone] ext. [phone]   Reason for call:   Would like to know the A1C percentage

## 2014-03-14 NOTE — Telephone Encounter (Signed)
New message   Meg with [employer] called to receive the Pt EF% from recent ECHO

## 2014-03-14 NOTE — Telephone Encounter (Signed)
Detailed message left for Emily Daniel advising of the last a1c level and the date. I made her aware if she needs to call back to give me a call.     KP

## 2014-03-14 NOTE — Telephone Encounter (Signed)
LMTCB ./CY 

## 2014-03-16 NOTE — Telephone Encounter (Signed)
Follow up      Returning Emily Daniel's call.  Please call tomorrow---she has left the office for the day

## 2014-03-17 ENCOUNTER — Other Ambulatory Visit (INDEPENDENT_AMBULATORY_CARE_PROVIDER_SITE_OTHER): Payer: Medicare Other

## 2014-03-17 DIAGNOSIS — E119 Type 2 diabetes mellitus without complications: Secondary | ICD-10-CM

## 2014-03-17 DIAGNOSIS — E785 Hyperlipidemia, unspecified: Secondary | ICD-10-CM

## 2014-03-17 LAB — HEMOGLOBIN A1C: Hgb A1c MFr Bld: 5.8 % (ref 4.6–6.5)

## 2014-03-17 MED ORDER — METFORMIN HCL ER 500 MG PO TB24: ORAL_TABLET | ORAL | Status: DC

## 2014-03-18 ENCOUNTER — Ambulatory Visit (INDEPENDENT_AMBULATORY_CARE_PROVIDER_SITE_OTHER): Payer: Medicare Other | Admitting: Pharmacist

## 2014-03-18 DIAGNOSIS — I4891 Unspecified atrial fibrillation: Secondary | ICD-10-CM

## 2014-03-18 DIAGNOSIS — I059 Rheumatic mitral valve disease, unspecified: Secondary | ICD-10-CM

## 2014-03-18 LAB — BASIC METABOLIC PANEL
BUN: 31 mg/dL — ABNORMAL HIGH (ref 6–23)
CALCIUM: 9.1 mg/dL (ref 8.4–10.5)
CO2: 23 mEq/L (ref 19–32)
CREATININE: 1.4 mg/dL — AB (ref 0.4–1.2)
Chloride: 108 mEq/L (ref 96–112)
GFR: 38.56 mL/min — ABNORMAL LOW (ref >60.00)
Glucose, Bld: 103 mg/dL — ABNORMAL HIGH (ref 70–99)
Potassium: 4.6 mEq/L (ref 3.5–5.1)
SODIUM: 139 meq/L (ref 135–145)

## 2014-03-18 LAB — LIPID PANEL
CHOL/HDL RATIO: 3
Cholesterol: 97 mg/dL (ref 0–200)
HDL: 37.1 mg/dL — ABNORMAL LOW (ref >39.00)
LDL Cholesterol: 44 mg/dL (ref 0–99)
NonHDL: 59.9
TRIGLYCERIDES: 78 mg/dL (ref 0.0–149.0)
VLDL: 15.6 mg/dL (ref 0.0–40.0)

## 2014-03-18 LAB — HEPATIC FUNCTION PANEL
ALK PHOS: 55 U/L (ref 39–117)
ALT: 17 U/L (ref 0–35)
AST: 23 U/L (ref 0–37)
Albumin: 3.3 g/dL — ABNORMAL LOW (ref 3.5–5.2)
Bilirubin, Direct: 0 mg/dL (ref 0.0–0.3)
TOTAL PROTEIN: 7 g/dL (ref 6.0–8.3)
Total Bilirubin: 0.7 mg/dL (ref 0.2–1.2)

## 2014-03-18 LAB — POCT INR: INR: 1.3

## 2014-03-18 NOTE — Telephone Encounter (Signed)
LMTCB ./CY 

## 2014-03-21 ENCOUNTER — Telehealth: Payer: Self-pay | Admitting: Family Medicine

## 2014-03-21 DIAGNOSIS — E119 Type 2 diabetes mellitus without complications: Secondary | ICD-10-CM

## 2014-03-21 NOTE — Telephone Encounter (Signed)
Caller name: Tymesha Relation to pt: Call back number:[phone]  Pharmacy:  Reason for call:  Returning call regarding labs.

## 2014-03-22 NOTE — Telephone Encounter (Signed)
Discussed with patient and she voiced understanding, she has an apt coming up and will repeat her BMP when she comes in for her CPE.       KP

## 2014-03-22 NOTE — Telephone Encounter (Signed)
Advise pt:   Cholesterol and diabetes under excellent control Kidney function decreased, rec to drink plenty of clear fluids, if she is taking any NSAIDs -other than aspirin- needs to stop. Please arrange a BMP in 2 weeks, dx DM

## 2014-03-23 ENCOUNTER — Telehealth: Payer: Self-pay | Admitting: Family Medicine

## 2014-03-23 NOTE — Telephone Encounter (Signed)
Caller name: Katieann  Call back number:[phone] Pharmacy:  Reason for call:  Pt wants to discuss medications and her kidneys.

## 2014-03-23 NOTE — Telephone Encounter (Signed)
Message forwarded to Helaine ChessAshlee Delsin Copen, RN.

## 2014-03-24 NOTE — Telephone Encounter (Signed)
Spoke with patient who had some concerns about her medications and her kidneys.  She wanted to know if any of her medications were causing her to have issues with her kidneys.  We discussed her medications and which medications to stay away from (NSAIDs -other than aspirin).  Pt stated understanding.  Again, she was also encouraged to increase her intake of clear fluids (i.e. Water).  Pt states that she is working on increasing her fluid intake.  Yesterday, she drank (2) 16 oz bottles of water, 8 oz of coffee, and 12 oz of tea.  Pt was encouraged to be cautious of caffeine intake and to focus on increasing her water intake.  Pt stated understanding and stated that she would try.

## 2014-03-25 ENCOUNTER — Ambulatory Visit (INDEPENDENT_AMBULATORY_CARE_PROVIDER_SITE_OTHER): Payer: Medicare Other | Admitting: *Deleted

## 2014-03-25 DIAGNOSIS — I4891 Unspecified atrial fibrillation: Secondary | ICD-10-CM

## 2014-03-25 DIAGNOSIS — I059 Rheumatic mitral valve disease, unspecified: Secondary | ICD-10-CM

## 2014-03-25 LAB — POCT INR: INR: 1.4

## 2014-03-28 NOTE — Telephone Encounter (Signed)
LMTCB ./CY 

## 2014-03-29 NOTE — Telephone Encounter (Signed)
MEG AWARE OF PT'S  EF  VALUE./CY

## 2014-04-04 ENCOUNTER — Ambulatory Visit (INDEPENDENT_AMBULATORY_CARE_PROVIDER_SITE_OTHER): Payer: Medicare Other | Admitting: Pharmacist

## 2014-04-04 DIAGNOSIS — I4891 Unspecified atrial fibrillation: Secondary | ICD-10-CM

## 2014-04-04 DIAGNOSIS — I059 Rheumatic mitral valve disease, unspecified: Secondary | ICD-10-CM

## 2014-04-04 LAB — POCT INR: INR: 1.3

## 2014-04-06 ENCOUNTER — Encounter: Payer: Medicare Other | Admitting: Family Medicine

## 2014-04-15 ENCOUNTER — Ambulatory Visit: Payer: Medicare Other | Admitting: Cardiovascular Disease

## 2014-04-18 ENCOUNTER — Ambulatory Visit (INDEPENDENT_AMBULATORY_CARE_PROVIDER_SITE_OTHER): Payer: Medicare Other | Admitting: Pharmacist

## 2014-04-18 DIAGNOSIS — I4891 Unspecified atrial fibrillation: Secondary | ICD-10-CM

## 2014-04-18 DIAGNOSIS — I059 Rheumatic mitral valve disease, unspecified: Secondary | ICD-10-CM

## 2014-04-18 LAB — POCT INR: INR: 1.9

## 2014-04-19 ENCOUNTER — Encounter: Payer: Self-pay | Admitting: Family Medicine

## 2014-04-19 ENCOUNTER — Ambulatory Visit (INDEPENDENT_AMBULATORY_CARE_PROVIDER_SITE_OTHER): Payer: Medicare Other | Admitting: Family Medicine

## 2014-04-19 VITALS — BP 126/70 | HR 72 | Temp 98.0°F | Ht 62.0 in | Wt 129.8 lb

## 2014-04-19 DIAGNOSIS — E119 Type 2 diabetes mellitus without complications: Secondary | ICD-10-CM

## 2014-04-19 DIAGNOSIS — R7989 Other specified abnormal findings of blood chemistry: Secondary | ICD-10-CM

## 2014-04-19 DIAGNOSIS — Z0000 Encounter for general adult medical examination without abnormal findings: Secondary | ICD-10-CM

## 2014-04-19 DIAGNOSIS — R748 Abnormal levels of other serum enzymes: Secondary | ICD-10-CM

## 2014-04-19 DIAGNOSIS — I10 Essential (primary) hypertension: Secondary | ICD-10-CM

## 2014-04-19 DIAGNOSIS — E785 Hyperlipidemia, unspecified: Secondary | ICD-10-CM

## 2014-04-19 DIAGNOSIS — Z23 Encounter for immunization: Secondary | ICD-10-CM

## 2014-04-19 MED ORDER — SITAGLIPTIN PHOSPHATE 100 MG PO TABS: 100.0000 mg | ORAL_TABLET | Freq: Every day | ORAL | Status: DC

## 2014-04-19 NOTE — Progress Notes (Signed)
Pre visit review using our clinic review tool, if applicable. No additional management support is needed unless otherwise documented below in the visit note. 

## 2014-04-19 NOTE — Progress Notes (Signed)
Subjective:    Emily Daniel is a 80 y.o. female who presents for Medicare Annual/Subsequent preventive examination.  Preventive Screening-Counseling & Management  Tobacco History  Smoking status  . Former Smoker -- 1.00 packs/day for 36 years  . Types: Cigarettes  . Quit date: 02/18/1984  Smokeless tobacco  . Never Used     Problems Prior to Visit 1.   Current Problems (verified) Patient Active Problem List   Diagnosis Date Noted  . Atrial fibrillation 11/22/2013  . Mitral valve disorders 11/22/2013  . S/P mitral valve repair 11/19/2013  . S/P CABG x 3 11/11/2013  . Chest pain 11/08/2013  . NSTEMI (non-ST elevated myocardial infarction) 11/08/2013  . Iron deficiency anemia 11/08/2013  . Acute heart failure 11/08/2013  . Cerumen impaction 09/30/2013  . B12 DEFICIENCY 06/21/2010  . OSTEOPOROSIS 03/16/2010  . BACK PAIN 01/02/2010  . PVD 04/14/2009  . ARTERIOVENOUS MALFORMATION, COLON 04/14/2009  . CHANGE IN BOWELS 04/14/2009  . DIABETES MELLITUS, TYPE II, UNCONTROLLED 03/13/2009  . ANXIETY STATE, UNSPECIFIED 03/13/2009  . DYSURIA 04/01/2008  . HYPERLIPIDEMIA 05/18/2007  . HYPERTENSION 02/02/2007  . ARTIFICIAL MENOPAUSE 02/02/2007    Medications Prior to Visit Current Outpatient Prescriptions on File Prior to Visit  Medication Sig Dispense Refill  . aspirin 81 MG tablet Take 81 mg by mouth daily.      [name] atorvastatin (LIPITOR) 40 MG tablet Take 1 tablet (40 mg total) by mouth daily. 90 tablet 3  . Calcium Carbonate-Vitamin D (CALCIUM-VITAMIN D) 600-200 MG-UNIT CAPS Take by mouth 2 (two) times daily.      [name] Sodium 30-100 MG CAPS Take 1 capsule by mouth daily as needed. As needed for constipation    . furosemide (LASIX) 20 MG tablet Take 1 tablet (20 mg total) by mouth daily. 90 tablet 3  . glucose blood (ONE TOUCH ULTRA TEST) test strip TEST TWICE DAILY Dx 250.02 100 each 9  . Lancets MISC One touch delica bid 100 each 1  . lisinopril  (PRINIVIL,ZESTRIL) 2.5 MG tablet Take 1 tablet (2.5 mg total) by mouth daily. 90 tablet 3  . metoprolol tartrate (LOPRESSOR) 25 MG tablet Take 1 tablet (25 mg total) by mouth 2 (two) times daily. 180 tablet 11  . Multiple Vitamin (MULTIVITAMIN) tablet Take 1 tablet by mouth daily.      . vitamin B-12 (CYANOCOBALAMIN) 1000 MCG tablet Take 1,000 mcg by mouth daily.      [name] warfarin (COUMADIN) 2 MG tablet Take 2 mg by mouth daily at 6 PM. Take 1 tab M & F and take 1/2 tab T-W-T-S-S     No current facility-administered medications on file prior to visit.    Current Medications (verified) Current Outpatient Prescriptions  Medication Sig Dispense Refill  . aspirin 81 MG tablet Take 81 mg by mouth daily.      [name] atorvastatin (LIPITOR) 40 MG tablet Take 1 tablet (40 mg total) by mouth daily. 90 tablet 3  . Calcium Carbonate-Vitamin D (CALCIUM-VITAMIN D) 600-200 MG-UNIT CAPS Take by mouth 2 (two) times daily.      [name] Sodium 30-100 MG CAPS Take 1 capsule by mouth daily as needed. As needed for constipation    . furosemide (LASIX) 20 MG tablet Take 1 tablet (20 mg total) by mouth daily. 90 tablet 3  . glucose blood (ONE TOUCH ULTRA TEST) test strip TEST TWICE DAILY Dx 250.02 100 each 9  . Lancets MISC One touch delica bid 100 each 1  . lisinopril (PRINIVIL,ZESTRIL)  2.5 MG tablet Take 1 tablet (2.5 mg total) by mouth daily. 90 tablet 3  . metoprolol tartrate (LOPRESSOR) 25 MG tablet Take 1 tablet (25 mg total) by mouth 2 (two) times daily. 180 tablet 11  . Multiple Vitamin (MULTIVITAMIN) tablet Take 1 tablet by mouth daily.      . vitamin B-12 (CYANOCOBALAMIN) 1000 MCG tablet Take 1,000 mcg by mouth daily.      [name] warfarin (COUMADIN) 2 MG tablet Take 2 mg by mouth daily at 6 PM. Take 1 tab M & F and take 1/2 tab T-W-T-S-S    . sitaGLIPtin (JANUVIA) 100 MG tablet Take 1 tablet (100 mg total) by mouth daily. 30 tablet 5   No current facility-administered medications for this visit.      Allergies (verified) Meperidine hcl and Sulfonamide derivatives   PAST HISTORY  Family History Family History  Problem Relation Age of Onset  . Cancer Mother     cervical   . Cancer Brother     renal ...x3??  [name] Hypertension Other   . Stroke Other   . Hypertension Sister   . Heart attack Neg Hx     Social History History  Substance Use Topics  . Smoking status: Former Smoker -- 1.00 packs/day for 36 years    Types: Cigarettes    Quit date: 02/18/1984  . Smokeless tobacco: Never Used  . Alcohol Use: No     Are there smokers in your home (other than you)? No  Risk Factors Current exercise habits: trouble walking due to pain from bypass surgery  Dietary issues discussed: n a  Cardiac risk factors: advanced age (older than 70 for men, 8 for women), diabetes mellitus, dyslipidemia, hypertension and sedentary lifestyle.  Depression Screen (Note: if answer to either of the following is "Yes", a more complete depression screening is indicated)   Over the past two weeks, have you felt down, depressed or hopeless? No  Over the past two weeks, have you felt little interest or pleasure in doing things? No  Have you lost interest or pleasure in daily life? No  Do you often feel hopeless? No  Do you cry easily over simple problems? No  Activities of Daily Living In your present state of health, do you have any difficulty performing the following activities?:  Driving? No Managing money?  No Feeding yourself? No Getting from bed to chair? No Climbing a flight of stairs? No Preparing food and eating?: No Bathing or showering? No Getting dressed: No Getting to the toilet? No Using the toilet:No Moving around from place to place: No In the past year have you fallen or had a near fall?:No   Are you sexually active?  No  Do you have more than one partner?  No  Hearing Difficulties: No Do you often ask people to speak up or repeat themselves? No Do you experience  ringing or noises in your ears? No Do you have difficulty understanding soft or whispered voices? No   Do you feel that you have a problem with memory? No  Do you often misplace items? No  Do you feel safe at home?  Yes  Cognitive Testing  Alert? Yes  Normal Appearance?Yes  Oriented to person? Yes  Place? Yes   Time? Yes  Recall of three objects?  Yes  Can perform simple calculations? Yes  Displays appropriate judgment?Yes  Can read the correct time from a watch face?Yes   Advanced Directives have been discussed with the patient? Yes  List the Names of Other Physician/Practitioners you currently use: 1.  cvd--- hendrickson 2. Card-nishan 3. opth-- dolan, beavis  Indicate any recent Medical Services you may have received from other than [facility] providers in the past year (date may be approximate).  Immunization History  Administered Date(s) Administered  . Influenza Split 03/20/2011, 03/31/2012  . Influenza Whole 03/04/2008, 03/28/2009, 03/16/2010  . Influenza, High Dose Seasonal PF 04/19/2014  . Pneumococcal Conjugate-13 12/21/2013  . Pneumococcal Polysaccharide-23 03/10/1997  . Tdap 04/16/2011  . Zoster 05/14/2007    Screening Tests Health Maintenance  Topic Date Due  . OPHTHALMOLOGY EXAM  01/13/2014  . URINE MICROALBUMIN  04/01/2014  . MAMMOGRAM  05/04/2014  . HEMOGLOBIN A1C  09/16/2014  . FOOT EXAM  12/22/2014  . INFLUENZA VACCINE  12/26/2014  . TETANUS/TDAP  04/15/2021  . COLONOSCOPY  07/21/2022  . PNEUMOCOCCAL POLYSACCHARIDE VACCINE AGE 75 AND OVER  Completed  . ZOSTAVAX  Completed    All answers were reviewed with the patient and necessary referrals were made:  Loreen Freud, DO   04/19/2014   History reviewed:  She  has a past medical history of Hyperlipidemia; Hypertension; Hemorrhoids; AVM (arteriovenous malformation); Diabetes mellitus, type 2; Anxiety; PVD (peripheral vascular disease); Osteoporosis; Vitamin B12 deficiency; Cataract; CAD (coronary artery  disease); Mitral regurgitation; Atrial fibrillation; Ischemic cardiomyopathy; Chronic systolic heart failure; and echocardiogram. She  does not have any pertinent problems on file. She  has past surgical history that includes Appendectomy; Abdominal hysterectomy; Hemorrhoid surgery; and Mitral valve replacement (mvr)/coronary artery bypass grafting (cabg) (N/A, 11/11/2013). Her family history includes Cancer in her brother and mother; Hypertension in her other and sister; Stroke in her other. There is no history of Heart attack. She  reports that she quit smoking about 30 years ago. Her smoking use included Cigarettes. She has a 36 pack-year smoking history. She has never used smokeless tobacco. She reports that she does not drink alcohol or use illicit drugs. She has a current medication list which includes the following prescription(s): aspirin, atorvastatin, calcium-vitamin d, casanthranol-docusate sodium, furosemide, glucose blood, lancets, lisinopril, metoprolol tartrate, multivitamin, vitamin b-12, warfarin, and sitagliptin. Current Outpatient Prescriptions on File Prior to Visit  Medication Sig Dispense Refill  . aspirin 81 MG tablet Take 81 mg by mouth daily.      [name] atorvastatin (LIPITOR) 40 MG tablet Take 1 tablet (40 mg total) by mouth daily. 90 tablet 3  . Calcium Carbonate-Vitamin D (CALCIUM-VITAMIN D) 600-200 MG-UNIT CAPS Take by mouth 2 (two) times daily.      [name] Sodium 30-100 MG CAPS Take 1 capsule by mouth daily as needed. As needed for constipation    . furosemide (LASIX) 20 MG tablet Take 1 tablet (20 mg total) by mouth daily. 90 tablet 3  . glucose blood (ONE TOUCH ULTRA TEST) test strip TEST TWICE DAILY Dx 250.02 100 each 9  . Lancets MISC One touch delica bid 100 each 1  . lisinopril (PRINIVIL,ZESTRIL) 2.5 MG tablet Take 1 tablet (2.5 mg total) by mouth daily. 90 tablet 3  . metoprolol tartrate (LOPRESSOR) 25 MG tablet Take 1 tablet (25 mg total) by mouth 2  (two) times daily. 180 tablet 11  . Multiple Vitamin (MULTIVITAMIN) tablet Take 1 tablet by mouth daily.      . vitamin B-12 (CYANOCOBALAMIN) 1000 MCG tablet Take 1,000 mcg by mouth daily.      [name] warfarin (COUMADIN) 2 MG tablet Take 2 mg by mouth daily at 6 PM. Take 1 tab M &  F and take 1/2 tab T-W-T-S-S     No current facility-administered medications on file prior to visit.   She is allergic to meperidine hcl and sulfonamide derivatives.  Review of Systems  Review of Systems  Constitutional: Negative for activity change, appetite change and fatigue.  HENT: Negative for hearing loss, congestion, tinnitus and ear discharge.   Eyes: Negative for visual disturbance (see optho q1y -- vision corrected to 20/20 with glasses).  Respiratory: Negative for cough, chest tightness and shortness of breath.   Cardiovascular: Negative for chest pain, palpitations and leg swelling.  Gastrointestinal: Negative for abdominal pain, diarrhea, constipation and abdominal distention.  Genitourinary: Negative for urgency, frequency, decreased urine volume and difficulty urinating.  Musculoskeletal: Negative for back pain, arthralgias and gait problem.  Skin: Negative for color change, pallor and rash.  Neurological: Negative for dizziness, light-headedness, numbness and headaches.  Hematological: Negative for adenopathy. Does not bruise/bleed easily.  Psychiatric/Behavioral: Negative for suicidal ideas, confusion, sleep disturbance, self-injury, dysphoric mood, decreased concentration and agitation.  Pt is able to read and write and can do all ADLs No risk for falling No abuse/ violence in home     Objective:     Vision by Snellen chart: opth  Body mass index is 23.73 kg/(m^2). BP 126/70 mmHg  Pulse 72  Temp(Src) 98 F (36.7 C) (Oral)  Ht 5\' 2"  (1.575 m)  Wt 129 lb 12.8 oz (58.877 kg)  BMI 23.73 kg/m2  SpO2 98%  BP 126/70 mmHg  Pulse 72  Temp(Src) 98 F (36.7 C) (Oral)  Ht 5\' 2"  (1.575 m)   Wt 129 lb 12.8 oz (58.877 kg)  BMI 23.73 kg/m2  SpO2 98% General appearance: alert, cooperative, appears stated age and no distress Head: Normocephalic, without obvious abnormality, atraumatic Eyes: conjunctivae/corneas clear. PERRL, EOM's intact. Fundi benign. Ears: normal TM's and external ear canals both ears Nose: Nares normal. Septum midline. Mucosa normal. No drainage or sinus tenderness. Throat: lips, mucosa, and tongue normal; teeth and gums normal Neck: no adenopathy, no carotid bruit, no JVD, supple, symmetrical, trachea midline and thyroid not enlarged, symmetric, no tenderness/mass/nodules Back: symmetric, no curvature. ROM normal. No CVA tenderness. Lungs: clear to auscultation bilaterally Breasts: normal appearance, no masses or tenderness Heart: S1, S2 normal Abdomen: soft, non-tender; bowel sounds normal; no masses,  no organomegaly Pelvic: not indicated; post-menopausal, no abnormal Pap smears in past Extremities: extremities normal, atraumatic, no cyanosis or edema Pulses: 2+ and symmetric Skin: Skin color, texture, turgor normal. No rashes or lesions Lymph nodes: Cervical, supraclavicular, and axillary nodes normal. Neurologic: Alert and oriented X 3, normal strength and tone. Normal symmetric reflexes. Normal coordination and gait Psych-- no depression, no anxiety      Assessment:     cpe      Plan:     During the course of the visit the patient was educated and counseled about appropriate screening and preventive services including:    Influenza vaccine  Screening mammography  Bone densitometry screening  Colorectal cancer screening  Diabetes screening  Glaucoma screening  Advanced directives: has an advanced directive - a copy HAS NOT been provided.  Diet review for nutrition referral? Yes ____  Not Indicated ___x_   Patient Instructions (the written plan) was given to the patient.  Medicare Attestation I have personally reviewed: The  patient's medical and social history Their use of alcohol, tobacco or illicit drugs Their current medications and supplements The patient's functional ability including ADLs,fall risks, home safety risks, cognitive, and hearing and  visual impairment Diet and physical activities Evidence for depression or mood disorders  The patient's weight, height, BMI, and visual acuity have been recorded in the chart.  I have made referrals, counseling, and provided education to the patient based on review of the above and I have provided the patient with a written personalized care plan for preventive services.   1. Need for vaccination for H flu type B  - Flu vaccine HIGH DOSE PF (Fluzone Tri High dose)  2. Elevated serum creatinine  - Basic metabolic panel  3. Diabetes type 2, controlled  - sitaGLIPtin (JANUVIA) 100 MG tablet; Take 1 tablet (100 mg total) by mouth daily.  Dispense: 30 tablet; Refill: 5  4. Hyperlipidemia  Labs reviewed  5. Essential hypertension Stable Cont' meds  6. Preventative health care     Loreen FreudYvonne Lowne, DO   04/19/2014

## 2014-04-19 NOTE — Patient Instructions (Signed)
Preventive Care for Adults A healthy lifestyle and preventive care can promote health and wellness. Preventive health guidelines for women include the following key practices.  A routine yearly physical is a good way to check with your health care provider about your health and preventive screening. It is a chance to share any concerns and updates on your health and to receive a thorough exam.  Visit your dentist for a routine exam and preventive care every 6 months. Brush your teeth twice a day and floss once a day. Good oral hygiene prevents tooth decay and gum disease.  The frequency of eye exams is based on your age, health, family medical history, use of contact lenses, and other factors. Follow your health care provider's recommendations for frequency of eye exams.  Eat a healthy diet. Foods like vegetables, fruits, whole grains, low-fat dairy products, and lean protein foods contain the nutrients you need without too many calories. Decrease your intake of foods high in solid fats, added sugars, and salt. Eat the right amount of calories for you.Get information about a proper diet from your health care provider, if necessary.  Regular physical exercise is one of the most important things you can do for your health. Most adults should get at least 150 minutes of moderate-intensity exercise (any activity that increases your heart rate and causes you to sweat) each week. In addition, most adults need muscle-strengthening exercises on 2 or more days a week.  Maintain a healthy weight. The body mass index (BMI) is a screening tool to identify possible weight problems. It provides an estimate of body fat based on height and weight. Your health care provider can find your BMI and can help you achieve or maintain a healthy weight.For adults 20 years and older:  A BMI below 18.5 is considered underweight.  A BMI of 18.5 to 24.9 is normal.  A BMI of 25 to 29.9 is considered overweight.  A BMI of  30 and above is considered obese.  Maintain normal blood lipids and cholesterol levels by exercising and minimizing your intake of saturated fat. Eat a balanced diet with plenty of fruit and vegetables. Blood tests for lipids and cholesterol should begin at age 76 and be repeated every 5 years. If your lipid or cholesterol levels are high, you are over 50, or you are at high risk for heart disease, you may need your cholesterol levels checked more frequently.Ongoing high lipid and cholesterol levels should be treated with medicines if diet and exercise are not working.  If you smoke, find out from your health care provider how to quit. If you do not use tobacco, do not start.  Lung cancer screening is recommended for adults aged 22-80 years who are at high risk for developing lung cancer because of a history of smoking. A yearly low-dose CT scan of the lungs is recommended for people who have at least a 30-pack-year history of smoking and are a current smoker or have quit within the past 15 years. A pack year of smoking is smoking an average of 1 pack of cigarettes a day for 1 year (for example: 1 pack a day for 30 years or 2 packs a day for 15 years). Yearly screening should continue until the smoker has stopped smoking for at least 15 years. Yearly screening should be stopped for people who develop a health problem that would prevent them from having lung cancer treatment.  If you are pregnant, do not drink alcohol. If you are breastfeeding,  be very cautious about drinking alcohol. If you are not pregnant and choose to drink alcohol, do not have more than 1 drink per day. One drink is considered to be 12 ounces (355 mL) of beer, 5 ounces (148 mL) of wine, or 1.5 ounces (44 mL) of liquor.  Avoid use of street drugs. Do not share needles with anyone. Ask for help if you need support or instructions about stopping the use of drugs.  High blood pressure causes heart disease and increases the risk of  stroke. Your blood pressure should be checked at least every 1 to 2 years. Ongoing high blood pressure should be treated with medicines if weight loss and exercise do not work.  If you are 75-52 years old, ask your health care provider if you should take aspirin to prevent strokes.  Diabetes screening involves taking a blood sample to check your fasting blood sugar level. This should be done once every 3 years, after age 15, if you are within normal weight and without risk factors for diabetes. Testing should be considered at a younger age or be carried out more frequently if you are overweight and have at least 1 risk factor for diabetes.  Breast cancer screening is essential preventive care for women. You should practice "breast self-awareness." This means understanding the normal appearance and feel of your breasts and may include breast self-examination. Any changes detected, no matter how small, should be reported to a health care provider. Women in their 58s and 30s should have a clinical breast exam (CBE) by a health care provider as part of a regular health exam every 1 to 3 years. After age 16, women should have a CBE every year. Starting at age 53, women should consider having a mammogram (breast X-ray test) every year. Women who have a family history of breast cancer should talk to their health care provider about genetic screening. Women at a high risk of breast cancer should talk to their health care providers about having an MRI and a mammogram every year.  Breast cancer gene (BRCA)-related cancer risk assessment is recommended for women who have family members with BRCA-related cancers. BRCA-related cancers include breast, ovarian, tubal, and peritoneal cancers. Having family members with these cancers may be associated with an increased risk for harmful changes (mutations) in the breast cancer genes BRCA1 and BRCA2. Results of the assessment will determine the need for genetic counseling and  BRCA1 and BRCA2 testing.  Routine pelvic exams to screen for cancer are no longer recommended for nonpregnant women who are considered low risk for cancer of the pelvic organs (ovaries, uterus, and vagina) and who do not have symptoms. Ask your health care provider if a screening pelvic exam is right for you.  If you have had past treatment for cervical cancer or a condition that could lead to cancer, you need Pap tests and screening for cancer for at least 20 years after your treatment. If Pap tests have been discontinued, your risk factors (such as having a new sexual partner) need to be reassessed to determine if screening should be resumed. Some women have medical problems that increase the chance of getting cervical cancer. In these cases, your health care provider may recommend more frequent screening and Pap tests.  The HPV test is an additional test that may be used for cervical cancer screening. The HPV test looks for the virus that can cause the cell changes on the cervix. The cells collected during the Pap test can be  tested for HPV. The HPV test could be used to screen women aged 30 years and older, and should be used in women of any age who have unclear Pap test results. After the age of 30, women should have HPV testing at the same frequency as a Pap test.  Colorectal cancer can be detected and often prevented. Most routine colorectal cancer screening begins at the age of 50 years and continues through age 75 years. However, your health care provider may recommend screening at an earlier age if you have risk factors for colon cancer. On a yearly basis, your health care provider may provide home test kits to check for hidden blood in the stool. Use of a small camera at the end of a tube, to directly examine the colon (sigmoidoscopy or colonoscopy), can detect the earliest forms of colorectal cancer. Talk to your health care provider about this at age 50, when routine screening begins. Direct  exam of the colon should be repeated every 5-10 years through age 75 years, unless early forms of pre-cancerous polyps or small growths are found.  People who are at an increased risk for hepatitis B should be screened for this virus. You are considered at high risk for hepatitis B if:  You were born in a country where hepatitis B occurs often. Talk with your health care provider about which countries are considered high risk.  Your parents were born in a high-risk country and you have not received a shot to protect against hepatitis B (hepatitis B vaccine).  You have HIV or AIDS.  You use needles to inject street drugs.  You live with, or have sex with, someone who has hepatitis B.  You get hemodialysis treatment.  You take certain medicines for conditions like cancer, organ transplantation, and autoimmune conditions.  Hepatitis C blood testing is recommended for all people born from 1945 through 1965 and any individual with known risks for hepatitis C.  Practice safe sex. Use condoms and avoid high-risk sexual practices to reduce the spread of sexually transmitted infections (STIs). STIs include gonorrhea, chlamydia, syphilis, trichomonas, herpes, HPV, and human immunodeficiency virus (HIV). Herpes, HIV, and HPV are viral illnesses that have no cure. They can result in disability, cancer, and death.  You should be screened for sexually transmitted illnesses (STIs) including gonorrhea and chlamydia if:  You are sexually active and are younger than 24 years.  You are older than 24 years and your health care provider tells you that you are at risk for this type of infection.  Your sexual activity has changed since you were last screened and you are at an increased risk for chlamydia or gonorrhea. Ask your health care provider if you are at risk.  If you are at risk of being infected with HIV, it is recommended that you take a prescription medicine daily to prevent HIV infection. This is  called preexposure prophylaxis (PrEP). You are considered at risk if:  You are a heterosexual woman, are sexually active, and are at increased risk for HIV infection.  You take drugs by injection.  You are sexually active with a partner who has HIV.  Talk with your health care provider about whether you are at high risk of being infected with HIV. If you choose to begin PrEP, you should first be tested for HIV. You should then be tested every 3 months for as long as you are taking PrEP.  Osteoporosis is a disease in which the bones lose minerals and strength   with aging. This can result in serious bone fractures or breaks. The risk of osteoporosis can be identified using a bone density scan. Women ages 65 years and over and women at risk for fractures or osteoporosis should discuss screening with their health care providers. Ask your health care provider whether you should take a calcium supplement or vitamin D to reduce the rate of osteoporosis.  Menopause can be associated with physical symptoms and risks. Hormone replacement therapy is available to decrease symptoms and risks. You should talk to your health care provider about whether hormone replacement therapy is right for you.  Use sunscreen. Apply sunscreen liberally and repeatedly throughout the day. You should seek shade when your shadow is shorter than you. Protect yourself by wearing long sleeves, pants, a wide-brimmed hat, and sunglasses year round, whenever you are outdoors.  Once a month, do a whole body skin exam, using a mirror to look at the skin on your back. Tell your health care provider of new moles, moles that have irregular borders, moles that are larger than a pencil eraser, or moles that have changed in shape or color.  Stay current with required vaccines (immunizations).  Influenza vaccine. All adults should be immunized every year.  Tetanus, diphtheria, and acellular pertussis (Td, Tdap) vaccine. Pregnant women should  receive 1 dose of Tdap vaccine during each pregnancy. The dose should be obtained regardless of the length of time since the last dose. Immunization is preferred during the 27th-36th week of gestation. An adult who has not previously received Tdap or who does not know her vaccine status should receive 1 dose of Tdap. This initial dose should be followed by tetanus and diphtheria toxoids (Td) booster doses every 10 years. Adults with an unknown or incomplete history of completing a 3-dose immunization series with Td-containing vaccines should begin or complete a primary immunization series including a Tdap dose. Adults should receive a Td booster every 10 years.  Varicella vaccine. An adult without evidence of immunity to varicella should receive 2 doses or a second dose if she has previously received 1 dose. Pregnant females who do not have evidence of immunity should receive the first dose after pregnancy. This first dose should be obtained before leaving the health care facility. The second dose should be obtained 4-8 weeks after the first dose.  Human papillomavirus (HPV) vaccine. Females aged 13-26 years who have not received the vaccine previously should obtain the 3-dose series. The vaccine is not recommended for use in pregnant females. However, pregnancy testing is not needed before receiving a dose. If a female is found to be pregnant after receiving a dose, no treatment is needed. In that case, the remaining doses should be delayed until after the pregnancy. Immunization is recommended for any person with an immunocompromised condition through the age of 26 years if she did not get any or all doses earlier. During the 3-dose series, the second dose should be obtained 4-8 weeks after the first dose. The third dose should be obtained 24 weeks after the first dose and 16 weeks after the second dose.  Zoster vaccine. One dose is recommended for adults aged 60 years or older unless certain conditions are  present.  Measles, mumps, and rubella (MMR) vaccine. Adults born before 1957 generally are considered immune to measles and mumps. Adults born in 1957 or later should have 1 or more doses of MMR vaccine unless there is a contraindication to the vaccine or there is laboratory evidence of immunity to   each of the three diseases. A routine second dose of MMR vaccine should be obtained at least 28 days after the first dose for students attending postsecondary schools, health care workers, or international travelers. People who received inactivated measles vaccine or an unknown type of measles vaccine during 1963-1967 should receive 2 doses of MMR vaccine. People who received inactivated mumps vaccine or an unknown type of mumps vaccine before 1979 and are at high risk for mumps infection should consider immunization with 2 doses of MMR vaccine. For females of childbearing age, rubella immunity should be determined. If there is no evidence of immunity, females who are not pregnant should be vaccinated. If there is no evidence of immunity, females who are pregnant should delay immunization until after pregnancy. Unvaccinated health care workers born before 1957 who lack laboratory evidence of measles, mumps, or rubella immunity or laboratory confirmation of disease should consider measles and mumps immunization with 2 doses of MMR vaccine or rubella immunization with 1 dose of MMR vaccine.  Pneumococcal 13-valent conjugate (PCV13) vaccine. When indicated, a person who is uncertain of her immunization history and has no record of immunization should receive the PCV13 vaccine. An adult aged 19 years or older who has certain medical conditions and has not been previously immunized should receive 1 dose of PCV13 vaccine. This PCV13 should be followed with a dose of pneumococcal polysaccharide (PPSV23) vaccine. The PPSV23 vaccine dose should be obtained at least 8 weeks after the dose of PCV13 vaccine. An adult aged 19  years or older who has certain medical conditions and previously received 1 or more doses of PPSV23 vaccine should receive 1 dose of PCV13. The PCV13 vaccine dose should be obtained 1 or more years after the last PPSV23 vaccine dose.  Pneumococcal polysaccharide (PPSV23) vaccine. When PCV13 is also indicated, PCV13 should be obtained first. All adults aged 65 years and older should be immunized. An adult younger than age 65 years who has certain medical conditions should be immunized. Any person who resides in a nursing home or long-term care facility should be immunized. An adult smoker should be immunized. People with an immunocompromised condition and certain other conditions should receive both PCV13 and PPSV23 vaccines. People with human immunodeficiency virus (HIV) infection should be immunized as soon as possible after diagnosis. Immunization during chemotherapy or radiation therapy should be avoided. Routine use of PPSV23 vaccine is not recommended for American Indians, [place] Natives, or people younger than 65 years unless there are medical conditions that require PPSV23 vaccine. When indicated, people who have unknown immunization and have no record of immunization should receive PPSV23 vaccine. One-time revaccination 5 years after the first dose of PPSV23 is recommended for people aged 19-64 years who have chronic kidney failure, nephrotic syndrome, asplenia, or immunocompromised conditions. People who received 1-2 doses of PPSV23 before age 65 years should receive another dose of PPSV23 vaccine at age 65 years or later if at least 5 years have passed since the previous dose. Doses of PPSV23 are not needed for people immunized with PPSV23 at or after age 65 years.  Meningococcal vaccine. Adults with asplenia or persistent complement component deficiencies should receive 2 doses of quadrivalent meningococcal conjugate (MenACWY-D) vaccine. The doses should be obtained at least 2 months apart.  Microbiologists working with certain meningococcal bacteria, military recruits, people at risk during an outbreak, and people who travel to or live in countries with a high rate of meningitis should be immunized. A first-year college student up through age   21 years who is living in a residence hall should receive a dose if she did not receive a dose on or after her 16th birthday. Adults who have certain high-risk conditions should receive one or more doses of vaccine.  Hepatitis A vaccine. Adults who wish to be protected from this disease, have certain high-risk conditions, work with hepatitis A-infected animals, work in hepatitis A research labs, or travel to or work in countries with a high rate of hepatitis A should be immunized. Adults who were previously unvaccinated and who anticipate close contact with an international adoptee during the first 60 days after arrival in the [place] from a country with a high rate of hepatitis A should be immunized.  Hepatitis B vaccine. Adults who wish to be protected from this disease, have certain high-risk conditions, may be exposed to blood or other infectious body fluids, are household contacts or sex partners of hepatitis B positive people, are clients or workers in certain care facilities, or travel to or work in countries with a high rate of hepatitis B should be immunized.  Haemophilus influenzae type b (Hib) vaccine. A previously unvaccinated person with asplenia or sickle cell disease or having a scheduled splenectomy should receive 1 dose of Hib vaccine. Regardless of previous immunization, a recipient of a hematopoietic stem cell transplant should receive a 3-dose series 6-12 months after her successful transplant. Hib vaccine is not recommended for adults with HIV infection. Preventive Services / Frequency Ages 64 to 68 years  Blood pressure check.** / Every 1 to 2 years.  Lipid and cholesterol check.** / Every 5 years beginning at age  22.  Clinical breast exam.** / Every 3 years for women in their 88s and 53s.  BRCA-related cancer risk assessment.** / For women who have family members with a BRCA-related cancer (breast, ovarian, tubal, or peritoneal cancers).  Pap test.** / Every 2 years from ages 90 through 51. Every 3 years starting at age 21 through age 56 or 3 with a history of 3 consecutive normal Pap tests.  HPV screening.** / Every 3 years from ages 24 through ages 1 to 46 with a history of 3 consecutive normal Pap tests.  Hepatitis C blood test.** / For any individual with known risks for hepatitis C.  Skin self-exam. / Monthly.  Influenza vaccine. / Every year.  Tetanus, diphtheria, and acellular pertussis (Tdap, Td) vaccine.** / Consult your health care provider. Pregnant women should receive 1 dose of Tdap vaccine during each pregnancy. 1 dose of Td every 10 years.  Varicella vaccine.** / Consult your health care provider. Pregnant females who do not have evidence of immunity should receive the first dose after pregnancy.  HPV vaccine. / 3 doses over 6 months, if 72 and younger. The vaccine is not recommended for use in pregnant females. However, pregnancy testing is not needed before receiving a dose.  Measles, mumps, rubella (MMR) vaccine.** / You need at least 1 dose of MMR if you were born in 1957 or later. You may also need a 2nd dose. For females of childbearing age, rubella immunity should be determined. If there is no evidence of immunity, females who are not pregnant should be vaccinated. If there is no evidence of immunity, females who are pregnant should delay immunization until after pregnancy.  Pneumococcal 13-valent conjugate (PCV13) vaccine.** / Consult your health care provider.  Pneumococcal polysaccharide (PPSV23) vaccine.** / 1 to 2 doses if you smoke cigarettes or if you have certain conditions.  Meningococcal vaccine.** /  1 dose if you are age 19 to 21 years and a first-year college  student living in a residence hall, or have one of several medical conditions, you need to get vaccinated against meningococcal disease. You may also need additional booster doses.  Hepatitis A vaccine.** / Consult your health care provider.  Hepatitis B vaccine.** / Consult your health care provider.  Haemophilus influenzae type b (Hib) vaccine.** / Consult your health care provider. Ages 40 to 64 years  Blood pressure check.** / Every 1 to 2 years.  Lipid and cholesterol check.** / Every 5 years beginning at age 20 years.  Lung cancer screening. / Every year if you are aged 55-80 years and have a 30-pack-year history of smoking and currently smoke or have quit within the past 15 years. Yearly screening is stopped once you have quit smoking for at least 15 years or develop a health problem that would prevent you from having lung cancer treatment.  Clinical breast exam.** / Every year after age 40 years.  BRCA-related cancer risk assessment.** / For women who have family members with a BRCA-related cancer (breast, ovarian, tubal, or peritoneal cancers).  Mammogram.** / Every year beginning at age 40 years and continuing for as long as you are in good health. Consult with your health care provider.  Pap test.** / Every 3 years starting at age 30 years through age 65 or 70 years with a history of 3 consecutive normal Pap tests.  HPV screening.** / Every 3 years from ages 30 years through ages 65 to 70 years with a history of 3 consecutive normal Pap tests.  Fecal occult blood test (FOBT) of stool. / Every year beginning at age 50 years and continuing until age 75 years. You may not need to do this test if you get a colonoscopy every 10 years.  Flexible sigmoidoscopy or colonoscopy.** / Every 5 years for a flexible sigmoidoscopy or every 10 years for a colonoscopy beginning at age 50 years and continuing until age 75 years.  Hepatitis C blood test.** / For all people born from 1945 through  1965 and any individual with known risks for hepatitis C.  Skin self-exam. / Monthly.  Influenza vaccine. / Every year.  Tetanus, diphtheria, and acellular pertussis (Tdap/Td) vaccine.** / Consult your health care provider. Pregnant women should receive 1 dose of Tdap vaccine during each pregnancy. 1 dose of Td every 10 years.  Varicella vaccine.** / Consult your health care provider. Pregnant females who do not have evidence of immunity should receive the first dose after pregnancy.  Zoster vaccine.** / 1 dose for adults aged 60 years or older.  Measles, mumps, rubella (MMR) vaccine.** / You need at least 1 dose of MMR if you were born in 1957 or later. You may also need a 2nd dose. For females of childbearing age, rubella immunity should be determined. If there is no evidence of immunity, females who are not pregnant should be vaccinated. If there is no evidence of immunity, females who are pregnant should delay immunization until after pregnancy.  Pneumococcal 13-valent conjugate (PCV13) vaccine.** / Consult your health care provider.  Pneumococcal polysaccharide (PPSV23) vaccine.** / 1 to 2 doses if you smoke cigarettes or if you have certain conditions.  Meningococcal vaccine.** / Consult your health care provider.  Hepatitis A vaccine.** / Consult your health care provider.  Hepatitis B vaccine.** / Consult your health care provider.  Haemophilus influenzae type b (Hib) vaccine.** / Consult your health care provider. Ages 65   years and over  Blood pressure check.** / Every 1 to 2 years.  Lipid and cholesterol check.** / Every 5 years beginning at age 22 years.  Lung cancer screening. / Every year if you are aged 73-80 years and have a 30-pack-year history of smoking and currently smoke or have quit within the past 15 years. Yearly screening is stopped once you have quit smoking for at least 15 years or develop a health problem that would prevent you from having lung cancer  treatment.  Clinical breast exam.** / Every year after age 4 years.  BRCA-related cancer risk assessment.** / For women who have family members with a BRCA-related cancer (breast, ovarian, tubal, or peritoneal cancers).  Mammogram.** / Every year beginning at age 40 years and continuing for as long as you are in good health. Consult with your health care provider.  Pap test.** / Every 3 years starting at age 9 years through age 34 or 91 years with 3 consecutive normal Pap tests. Testing can be stopped between 65 and 70 years with 3 consecutive normal Pap tests and no abnormal Pap or HPV tests in the past 10 years.  HPV screening.** / Every 3 years from ages 57 years through ages 64 or 45 years with a history of 3 consecutive normal Pap tests. Testing can be stopped between 65 and 70 years with 3 consecutive normal Pap tests and no abnormal Pap or HPV tests in the past 10 years.  Fecal occult blood test (FOBT) of stool. / Every year beginning at age 15 years and continuing until age 17 years. You may not need to do this test if you get a colonoscopy every 10 years.  Flexible sigmoidoscopy or colonoscopy.** / Every 5 years for a flexible sigmoidoscopy or every 10 years for a colonoscopy beginning at age 86 years and continuing until age 71 years.  Hepatitis C blood test.** / For all people born from [birth year] through 1963 and any individual with known risks for hepatitis C.  Osteoporosis screening.** / A one-time screening for women ages 83 years and over and women at risk for fractures or osteoporosis.  Skin self-exam. / Monthly.  Influenza vaccine. / Every year.  Tetanus, diphtheria, and acellular pertussis (Tdap/Td) vaccine.** / 1 dose of Td every 10 years.  Varicella vaccine.** / Consult your health care provider.  Zoster vaccine.** / 1 dose for adults aged 61 years or older.  Pneumococcal 13-valent conjugate (PCV13) vaccine.** / Consult your health care provider.  Pneumococcal  polysaccharide (PPSV23) vaccine.** / 1 dose for all adults aged 28 years and older.  Meningococcal vaccine.** / Consult your health care provider.  Hepatitis A vaccine.** / Consult your health care provider.  Hepatitis B vaccine.** / Consult your health care provider.  Haemophilus influenzae type b (Hib) vaccine.** / Consult your health care provider. ** Family history and personal history of risk and conditions may change your health care provider's recommendations. Document Released: 07/09/2001 Document Revised: 09/27/2013 Document Reviewed: 10/08/2010 [facility] Patient Information 2015 [place], [place]. This information is not intended to replace advice given to you by your health care provider. Make sure you discuss any questions you have with your health care provider.

## 2014-04-20 ENCOUNTER — Telehealth: Payer: Self-pay | Admitting: Cardiovascular Disease

## 2014-04-20 LAB — BASIC METABOLIC PANEL
BUN: 26 mg/dL — AB (ref 6–23)
CHLORIDE: 106 meq/L (ref 96–112)
CO2: 23 mEq/L (ref 19–32)
Calcium: 9.2 mg/dL (ref 8.4–10.5)
Creatinine, Ser: 1.2 mg/dL (ref 0.4–1.2)
GFR: 45.68 mL/min — ABNORMAL LOW (ref >60.00)
GLUCOSE: 66 mg/dL — AB (ref 70–99)
Potassium: 4.2 mEq/L (ref 3.5–5.1)
Sodium: 140 mEq/L (ref 135–145)

## 2014-04-20 NOTE — Telephone Encounter (Signed)
New message      Pt is on coumadin.  Question about eating greens

## 2014-04-20 NOTE — Telephone Encounter (Signed)
Spoke with pt.  She was asking if she needed to eat her greens on certain days of the week.  Told pt that we are not as concerned about which days she eats them as long as she is consistent with the number of servings.  She is currently eating 2 servings per week.  Explained to keep this consistent and we will adjust Coumadin dose as needed.

## 2014-04-26 ENCOUNTER — Telehealth: Payer: Self-pay | Admitting: Family Medicine

## 2014-04-26 NOTE — Telephone Encounter (Signed)
Caller name: Natale MilchSpillers, Michole R Relation to pt: self  Call back number: [phone]   Reason for call:  Pt inquirng about test results

## 2014-04-26 NOTE — Telephone Encounter (Signed)
Notes Recorded by Dorette GrateKaylyn C Faulkner, CMA on 04/25/2014 at 1:16 PM Letter printed and mailed to Pt. Notes Recorded by Lelon PerlaYvonne R Lowne, DO on 04/24/2014 at 6:24 PM Overall good--- improved   Patient has been made aware and she voiced understanding.   6 mo follow up has been scheduled.       KP

## 2014-05-02 ENCOUNTER — Ambulatory Visit (INDEPENDENT_AMBULATORY_CARE_PROVIDER_SITE_OTHER): Payer: Medicare Other

## 2014-05-02 DIAGNOSIS — I059 Rheumatic mitral valve disease, unspecified: Secondary | ICD-10-CM

## 2014-05-02 DIAGNOSIS — I4891 Unspecified atrial fibrillation: Secondary | ICD-10-CM

## 2014-05-02 LAB — POCT INR: INR: 2.5

## 2014-05-05 ENCOUNTER — Encounter (HOSPITAL_COMMUNITY): Payer: Self-pay | Admitting: Cardiovascular Disease

## 2014-05-19 NOTE — Progress Notes (Signed)
Patient ID: Emily MilchMary R Daniel, female   DOB: 1929-10-02, 80 y.o.   MRN: [id number] Emily Daniel is a 80 y.o. female with a history of CAD (question of remote MI in the 781980s), HTN, HL, diabetes, PAD, AVMs, prior tobacco abuse. She was admitted 10/2013 with a NSTEMI c/b acute systolic CHF. Cardiac catheterization demonstrated severe 3 vessel CAD with left main involvement, severe mitral regurgitation and EF was 30-35% by echocardiogram >>> CABG with mitral valve repair as well as closure of PFO by Dr. Dorris FetchHendrickson. Postoperative course was complicated by distal emboli to her toes managed conservatively. She had atrial fibrillation and was placed on amiodarone and Coumadin secondary to intermittent atrial fibrillation but was discharged in NSR.  I saw her 12/15/13. ACE inhibitor was added to her medical regimen. She returns for follow up. She continues to do well.   F/U echo 02/14/14 Reviewed and EF improved to 40-45%  Study Conclusions  - Left ventricle: The cavity size was normal. Wall thickness was increased in a pattern of mild LVH. Systolic function was mildly to moderately reduced. The estimated ejection fraction was in the range of 40% to 45%. There is akinesis of the basalinferior myocardium. Doppler parameters are consistent with abnormal left ventricular relaxation (grade 1 diastolic dysfunction). Doppler parameters are consistent with high ventricular filling pressure. - Aortic valve: Cusp separation was reduced. There was trivial regurgitation. - Mitral valve: Prior mitral repair, annular ring. The findings are consistent with mild stenosis. There was mild regurgitation. Valve area by continuity equation (using LVOT flow): 1.03 cm^2. - Left atrium: The atrium was mildly dilated. - Pulmonary arteries: PA peak pressure: 32 mm Hg (S).  ? Stop coumadin at this visit     ROS: Denies fever, malais, weight loss, blurry vision, decreased visual acuity, cough, sputum, SOB, hemoptysis,  pleuritic pain, palpitaitons, heartburn, abdominal pain, melena, lower extremity edema, claudication, or rash.  All other systems reviewed and negative  General: Affect appropriate Healthy:  appears stated age HEENT: normal Neck supple with no adenopathy JVP normal no bruits no thyromegaly Lungs clear with no wheezing and good diaphragmatic motion Heart:  S1/S2 no murmur, no rub, gallop or click PMI normal Abdomen: benighn, BS positve, no tenderness, no AAA no bruit.  No HSM or HJR Distal pulses intact with no bruits No edema Neuro non-focal Skin warm and dry No muscular weakness   Current Outpatient Prescriptions  Medication Sig Dispense Refill  . aspirin 81 MG tablet Take 81 mg by mouth daily.      [name]. atorvastatin (LIPITOR) 40 MG tablet Take 1 tablet (40 mg total) by mouth daily. 90 tablet 3  . Calcium Carbonate-Vitamin D (CALCIUM-VITAMIN D) 600-200 MG-UNIT CAPS Take by mouth 2 (two) times daily.      [name]. Casanthranol-Docusate Sodium 30-100 MG CAPS Take 1 capsule by mouth daily as needed. As needed for constipation    . furosemide (LASIX) 20 MG tablet Take 1 tablet (20 mg total) by mouth daily. 90 tablet 3  . glucose blood (ONE TOUCH ULTRA TEST) test strip TEST TWICE DAILY Dx 250.02 100 each 9  . Lancets MISC One touch delica bid 100 each 1  . lisinopril (PRINIVIL,ZESTRIL) 2.5 MG tablet Take 1 tablet (2.5 mg total) by mouth daily. 90 tablet 3  . metoprolol tartrate (LOPRESSOR) 25 MG tablet Take 1 tablet (25 mg total) by mouth 2 (two) times daily. 180 tablet 11  . Multiple Vitamin (MULTIVITAMIN) tablet Take 1 tablet by mouth daily.      .[name]  sitaGLIPtin (JANUVIA) 100 MG tablet Take 1 tablet (100 mg total) by mouth daily. 30 tablet 5  . vitamin B-12 (CYANOCOBALAMIN) 1000 MCG tablet Take 1,000 mcg by mouth daily.      [name]. warfarin (COUMADIN) 2 MG tablet Take 2 mg by mouth daily at 6 PM. Take 1 tab M & F and take 1/2 tab T-[id number]-T-[id number]     No current facility-administered medications for this  visit.    Allergies  Meperidine hcl and Sulfonamide derivatives  Electrocardiogram:  8/15  SR rate 72  Nonspecific ST changes RAD PR 226   Assessment and Plan

## 2014-05-23 ENCOUNTER — Ambulatory Visit (INDEPENDENT_AMBULATORY_CARE_PROVIDER_SITE_OTHER): Payer: Medicare Other | Admitting: Cardiovascular Disease

## 2014-05-23 ENCOUNTER — Encounter: Payer: Self-pay | Admitting: Cardiovascular Disease

## 2014-05-23 ENCOUNTER — Other Ambulatory Visit (HOSPITAL_COMMUNITY): Payer: Self-pay | Admitting: *Deleted

## 2014-05-23 ENCOUNTER — Ambulatory Visit: Payer: Medicare Other | Admitting: Pharmacist

## 2014-05-23 VITALS — BP 126/58 | HR 66 | Ht 62.0 in | Wt 126.0 lb

## 2014-05-23 DIAGNOSIS — E1159 Type 2 diabetes mellitus with other circulatory complications: Secondary | ICD-10-CM

## 2014-05-23 DIAGNOSIS — I059 Rheumatic mitral valve disease, unspecified: Secondary | ICD-10-CM

## 2014-05-23 DIAGNOSIS — M79606 Pain in leg, unspecified: Secondary | ICD-10-CM

## 2014-05-23 DIAGNOSIS — I480 Paroxysmal atrial fibrillation: Secondary | ICD-10-CM

## 2014-05-23 DIAGNOSIS — Z9889 Other specified postprocedural states: Secondary | ICD-10-CM

## 2014-05-23 DIAGNOSIS — I739 Peripheral vascular disease, unspecified: Secondary | ICD-10-CM

## 2014-05-23 DIAGNOSIS — I70219 Atherosclerosis of native arteries of extremities with intermittent claudication, unspecified extremity: Secondary | ICD-10-CM

## 2014-05-23 DIAGNOSIS — I10 Essential (primary) hypertension: Secondary | ICD-10-CM

## 2014-05-23 DIAGNOSIS — Z951 Presence of aortocoronary bypass graft: Secondary | ICD-10-CM

## 2014-05-23 DIAGNOSIS — I4891 Unspecified atrial fibrillation: Secondary | ICD-10-CM

## 2014-05-23 NOTE — Patient Instructions (Signed)
Your physician has recommended you make the following change in your medication:  1) STOP coumadin 2) HOLD lipitor (atorvastatin) for 4 weeks to see if leg pain resolves  Your physician has requested that you have an ankle brachial index (ABI). During this test an ultrasound and blood pressure cuff are used to evaluate the arteries that supply the arms and legs with blood. Allow thirty minutes for this exam. There are no restrictions or special instructions.  Your physician wants you to follow-up in: 6 months with Dr. Eden EmmsNishan. You will receive a reminder letter in the mail two months in advance. If you don't receive a letter, please call our office to schedule the follow-up appointment.

## 2014-05-23 NOTE — Assessment & Plan Note (Signed)
Discussed low carb diet.  Target hemoglobin A1c is 6.5 or less.  Continue current medications. She wants to be back on januvia and off glucophage  F/u primary

## 2014-05-23 NOTE — Assessment & Plan Note (Signed)
Stable with no angina and good activity level.  Continue medical Rx  

## 2014-05-23 NOTE — Assessment & Plan Note (Signed)
Good repair with no residual murmur SBE prophylaxis  Stable

## 2014-05-23 NOTE — Assessment & Plan Note (Signed)
Resolved post op NSR given age will stop coumadin and see how she does

## 2014-05-23 NOTE — Assessment & Plan Note (Signed)
Well controlled.  Continue current medications and low sodium Dash type diet.    

## 2014-05-23 NOTE — Assessment & Plan Note (Signed)
Etiology not clear pulses palpable  Hold lipitor for 4 weeks see if it helps.  Check ABI's  Continue diuretic for edema

## 2014-05-25 ENCOUNTER — Other Ambulatory Visit (HOSPITAL_COMMUNITY): Payer: Self-pay | Admitting: Cardiology

## 2014-05-25 ENCOUNTER — Ambulatory Visit (HOSPITAL_COMMUNITY): Payer: Medicare Other | Attending: Cardiology | Admitting: Cardiology

## 2014-05-25 DIAGNOSIS — Z87891 Personal history of nicotine dependence: Secondary | ICD-10-CM | POA: Insufficient documentation

## 2014-05-25 DIAGNOSIS — I10 Essential (primary) hypertension: Secondary | ICD-10-CM | POA: Diagnosis not present

## 2014-05-25 DIAGNOSIS — Z951 Presence of aortocoronary bypass graft: Secondary | ICD-10-CM | POA: Insufficient documentation

## 2014-05-25 DIAGNOSIS — E785 Hyperlipidemia, unspecified: Secondary | ICD-10-CM | POA: Diagnosis not present

## 2014-05-25 DIAGNOSIS — E119 Type 2 diabetes mellitus without complications: Secondary | ICD-10-CM | POA: Diagnosis not present

## 2014-05-25 DIAGNOSIS — I739 Peripheral vascular disease, unspecified: Secondary | ICD-10-CM | POA: Insufficient documentation

## 2014-05-25 DIAGNOSIS — I70219 Atherosclerosis of native arteries of extremities with intermittent claudication, unspecified extremity: Secondary | ICD-10-CM

## 2014-05-25 NOTE — Progress Notes (Signed)
Lower arterial doppler and bilateral duplex performed  

## 2014-05-30 ENCOUNTER — Telehealth: Payer: Self-pay | Admitting: Cardiovascular Disease

## 2014-05-30 NOTE — Telephone Encounter (Signed)
Patient informed. Will discuss with her family and call us back.

## 2014-05-30 NOTE — Telephone Encounter (Signed)
New message      Want doppler results

## 2014-06-02 ENCOUNTER — Ambulatory Visit (INDEPENDENT_AMBULATORY_CARE_PROVIDER_SITE_OTHER): Payer: Medicare Other | Admitting: Family Medicine

## 2014-06-02 ENCOUNTER — Encounter: Payer: Self-pay | Admitting: Family Medicine

## 2014-06-02 ENCOUNTER — Encounter: Payer: Medicare Other | Admitting: Family Medicine

## 2014-06-02 VITALS — BP 136/53 | HR 65 | Temp 98.3°F | Resp 16 | Wt 125.0 lb

## 2014-06-02 DIAGNOSIS — R82998 Other abnormal findings in urine: Secondary | ICD-10-CM

## 2014-06-02 DIAGNOSIS — N390 Urinary tract infection, site not specified: Secondary | ICD-10-CM

## 2014-06-02 LAB — POCT URINALYSIS DIPSTICK
Bilirubin, UA: NEGATIVE
Glucose, UA: NEGATIVE
KETONES UA: NEGATIVE
NITRITE UA: POSITIVE
PH UA: 6
Spec Grav, UA: >=1.03
UROBILINOGEN UA: 4

## 2014-06-02 MED ORDER — NITROFURANTOIN MONOHYD MACRO 100 MG PO CAPS: 100.0000 mg | ORAL_CAPSULE | Freq: Two times a day (BID) | ORAL | Status: DC

## 2014-06-02 NOTE — Progress Notes (Signed)
Pre visit review using our clinic review tool, if applicable. No additional management support is needed unless otherwise documented below in the visit note. 

## 2014-06-02 NOTE — Patient Instructions (Signed)

## 2014-06-02 NOTE — Progress Notes (Signed)
  ZOX:[id number]:Emily Lowne, DO Chief Complaint  Patient presents with  . Urinary Tract Infection    burning with urination, frequency, sediment in urine x 1 week    Current Issues:  Presents with several days of dysuria, urinary urgency and urinary frequency Associated symptoms include:  dysuria, lower abdominal pain, urinary frequency and urinary urgency  There is a previous history of of similar symptoms. Sexually active:  No   No concern for STI.  Prior to Admission medications   Medication Sig Start Date End Date Taking? Authorizing Provider  aspirin 81 MG tablet Take 81 mg by mouth daily.     Yes Historical Provider, MD  Calcium Carbonate-Vitamin D (CALCIUM-VITAMIN D) 600-200 MG-UNIT CAPS Take by mouth 2 (two) times daily.     Yes Historical Provider, MD  Casanthranol-Docusate Sodium 30-100 MG CAPS Take 1 capsule by mouth daily as needed. As needed for constipation   Yes Historical Provider, MD  furosemide (LASIX) 20 MG tablet Take 1 tablet (20 mg total) by mouth daily. 12/15/13  Yes Beatrice LecherScott T Weaver, PA-C  glucose blood (ONE TOUCH ULTRA TEST) test strip TEST TWICE DAILY Dx 250.02 12/21/13  Yes Lelon PerlaYvonne R Lowne, DO  Lancets MISC One touch delica bid 12/21/13  Yes Emily R Lowne, DO  lisinopril (PRINIVIL,ZESTRIL) 2.5 MG tablet Take 1 tablet (2.5 mg total) by mouth daily. 12/15/13  Yes Beatrice LecherScott T Weaver, PA-C  metoprolol tartrate (LOPRESSOR) 25 MG tablet Take 1 tablet (25 mg total) by mouth 2 (two) times daily. 03/08/14  Yes Wendall StadePeter C Nishan, MD  Multiple Vitamin (MULTIVITAMIN) tablet Take 1 tablet by mouth daily.     Yes Historical Provider, MD  sitaGLIPtin (JANUVIA) 100 MG tablet Take 1 tablet (100 mg total) by mouth daily. 04/19/14  Yes Lelon PerlaYvonne R Lowne, DO  vitamin B-12 (CYANOCOBALAMIN) 1000 MCG tablet Take 1,000 mcg by mouth daily.     Yes Historical Provider, MD  atorvastatin (LIPITOR) 40 MG tablet Take 1 tablet (40 mg total) by mouth daily. Patient not taking: Reported on 06/02/2014 03/08/14   Wendall StadePeter C  Nishan, MD  nitrofurantoin, macrocrystal-monohydrate, (MACROBID) 100 MG capsule Take 1 capsule (100 mg total) by mouth 2 (two) times daily. 06/02/14   Lelon PerlaYvonne R Lowne, DO    Review of Systems:.  PE:  BP 136/53 mmHg  Pulse 65  Temp(Src) 98.3 F (36.8 C) (Oral)  Resp 16  Wt 125 lb (56.7 kg)  SpO2 100% Constitutional-- no fever, chills Heart-no cp, sob Lungs- no sob Back-no pain Abdomen-- soft , nt Pelvic--no pelvic pain or d/c  Results for orders placed or performed in visit on 06/02/14  POCT Urinalysis Dipstick  Result Value Ref Range   Color, UA Yellow    Clarity, UA Cloudy    Glucose, UA Neg    Bilirubin, UA Neg    Ketones, UA Neg    Spec Grav, UA >=1.030    Blood, UA Small    pH, UA 6.0    Protein, UA Small    Urobilinogen, UA 4.0    Nitrite, UA Positive    Leukocytes, UA large (3+)     Assessment and Plan:  1. Urinary tract infection without hematuria, site unspecified Drink more fluids - nitrofurantoin, macrocrystal-monohydrate, (MACROBID) 100 MG capsule; Take 1 capsule (100 mg total) by mouth 2 (two) times daily.  Dispense: 14 capsule; Refill: 0 - POCT Urinalysis Dipstick - Urine Culture  2. Leukocytes in urine  - Urine Culture

## 2014-06-03 ENCOUNTER — Telehealth: Payer: Self-pay | Admitting: *Deleted

## 2014-06-05 LAB — URINE CULTURE: Colony Count: >=100000

## 2014-06-06 NOTE — Telephone Encounter (Signed)
Patient dropped off [employer] Patient Assistance Program enrollment form for [employer]. Form completed and mialed to [employer] at [employer] [address] [place] [phone]. Copy sent for scanning. JG//CMA

## 2014-06-07 ENCOUNTER — Telehealth: Payer: Self-pay | Admitting: *Deleted

## 2014-06-07 DIAGNOSIS — N390 Urinary tract infection, site not specified: Secondary | ICD-10-CM

## 2014-06-07 MED ORDER — CIPROFLOXACIN HCL 250 MG PO TABS: 250.0000 mg | ORAL_TABLET | Freq: Two times a day (BID) | ORAL | Status: AC

## 2014-06-07 NOTE — Telephone Encounter (Signed)
-----   Message from Lelon PerlaYvonne R Lowne, DO sent at 06/06/2014  9:45 AM EST ----- The sensitivity of the macrobid is only intermediate---  Should change to cipro 250 mg 1 po bid for 3 days  Recheck urine in 2 weeks

## 2014-06-07 NOTE — Telephone Encounter (Signed)
Notified patient of results.  Cipro Rx sent to [facility] pharmacy per patient request.  Follow-up labs previously ordered and patient aware.    EAL

## 2014-06-07 NOTE — Telephone Encounter (Signed)
Urine culture labs ordered.    EAL

## 2014-06-14 ENCOUNTER — Encounter: Payer: Self-pay | Admitting: Family Medicine

## 2014-06-16 ENCOUNTER — Other Ambulatory Visit: Payer: Medicare Other

## 2014-06-16 DIAGNOSIS — N390 Urinary tract infection, site not specified: Secondary | ICD-10-CM | POA: Diagnosis not present

## 2014-06-17 LAB — URINE CULTURE
COLONY COUNT: NO GROWTH
Organism ID, Bacteria: NO GROWTH

## 2014-07-05 ENCOUNTER — Telehealth: Payer: Self-pay | Admitting: Cardiovascular Disease

## 2014-07-05 NOTE — Telephone Encounter (Signed)
Stay off statin for now given age

## 2014-07-05 NOTE — Telephone Encounter (Signed)
New message     Pt c/o medication issue:  1. Name of Medication: lipitor 2. How are you currently taking this medication (dosage and times per day)? 40mg  daily 3. Are you having a reaction (difficulty breathing--STAT)? no  4. What is your medication issue? Pt stopped lipitor for 4wks , leg pain stopped.  Should she be on another cholesterol medication?

## 2014-07-05 NOTE — Telephone Encounter (Signed)
PT  DISCONTINUED  ATORVASTATIN  40 MG   4 WEEKS  AGO  AND LEG  PAIN  IS GONE  WILL FORWARD TO  DR Eden EmmsNISHAN  FOR  RECOMMENDATIONS IF  PT  NEEDS TO TRY  ANOTHER  STATIN .Zack Seal/CY

## 2014-07-06 NOTE — Telephone Encounter (Signed)
PT  NOTIFIED ./CY 

## 2014-07-19 ENCOUNTER — Telehealth: Payer: Self-pay | Admitting: Cardiovascular Disease

## 2014-07-19 NOTE — Telephone Encounter (Signed)
PT  AWARE  SCHEDULERS TO  CALL  AND   MAKE  AN  APPT  WITH  DR  ARIDA  OR  DR  BERRY  PER  DR  Eden EmmsNISHAN FOR  RIGHT  SIDED   PVD .Zack Seal/CY

## 2014-07-19 NOTE — Telephone Encounter (Signed)
New message     Pt want us to make an appt for her to see a vascular doctor.  She said Dr Eden EmmsNishan want her to see a vascular doctor.  Please call

## 2014-07-19 NOTE — Telephone Encounter (Signed)
LM TO CALL BACK   PER  LOWER  ART  RESULTS   PT  NEEDS APPT  WITH   DR  ARIDA  OR  DR  BERRY  FOR  RIGHT  SIDED  PVD .Zack Seal/CY

## 2014-07-22 ENCOUNTER — Telehealth: Payer: Self-pay | Admitting: Cardiovascular Disease

## 2014-07-22 NOTE — Telephone Encounter (Signed)
Staff [facility] the patient to schedule PV consult with Dr. Kirke CorinArida/ Dr. Allyson SabalBerry.

## 2014-07-22 NOTE — Telephone Encounter (Signed)
New message      Pt said someone is supposed to be making her an appt with a vascular doctor.  No one has called.  Did Dr Eden EmmsNishan still want her to see a vascular doctor?

## 2014-07-26 NOTE — Telephone Encounter (Signed)
Appt scheduled for 08/09/14

## 2014-08-02 ENCOUNTER — Ambulatory Visit: Payer: Medicare Other | Admitting: Cardiovascular Disease

## 2014-08-09 ENCOUNTER — Ambulatory Visit (INDEPENDENT_AMBULATORY_CARE_PROVIDER_SITE_OTHER): Payer: Medicare Other | Admitting: Cardiovascular Disease

## 2014-08-09 ENCOUNTER — Encounter: Payer: Self-pay | Admitting: Cardiovascular Disease

## 2014-08-09 VITALS — BP 168/76 | HR 76 | Ht 62.0 in | Wt 127.8 lb

## 2014-08-09 DIAGNOSIS — Z951 Presence of aortocoronary bypass graft: Secondary | ICD-10-CM | POA: Diagnosis not present

## 2014-08-09 DIAGNOSIS — I739 Peripheral vascular disease, unspecified: Secondary | ICD-10-CM

## 2014-08-09 NOTE — Assessment & Plan Note (Signed)
She has overall atypical symptoms with bilateral calf pain which does not seem to be claudication. 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 She has no palpable femoral pulse on the right side. I requested an aortoiliac duplex. I would have her follow-up with me after that to discuss revascularization options.

## 2014-08-09 NOTE — Patient Instructions (Signed)
Your physician has recommended an aorto-iliac duplex.  Do not eat after midnight the day before and avoid carbonated beverages.  Your physician recommends that you schedule a follow-up appointment in: 1 MONTH with Dr Kirke CorinArida  Your physician recommends that you continue on your current medications as directed. Please refer to the Current Medication list given to you today.

## 2014-08-09 NOTE — Progress Notes (Signed)
Primary cardiologist: Dr. Eden Emms  HPI  Emily Daniel is a 81 y.o. female who was referred for evaluation and management of peripheral arterial disease. She has known history of CAD (question of remote MI in the 50s), HTN, HL, diabetes, PAD, AVMs, prior tobacco abuse. She was admitted 10/2013 with a NSTEMI c/b acute systolic CHF. Cardiac catheterization demonstrated severe 3 vessel CAD with left main involvement, severe mitral regurgitation and EF was 30-35% by echocardiogram >>> CABG with mitral valve repair as well as closure of PFO by Dr. Dorris Fetch. Postoperative course was complicated by distal emboli to her toes managed conservatively. She had atrial fibrillation and was placed on amiodarone and Coumadin secondary to intermittent atrial fibrillation but was discharged in NSR.  She complained of bilateral leg pain recently mostly at the vein harvest site in both legs. She underwent noninvasive evaluation which showed normal ABI on the left side and severely reduced on the right and 0.4 with evidence of severe inflow disease. Upon questioning, the patient does complain of right hip pain after walking about 100 feet. This forces her to stop and rest for few minutes before she can resume.   Allergies  Allergen Reactions  . Meperidine Hcl Nausea And Vomiting  . Sulfonamide Derivatives Nausea And Vomiting     Current Outpatient Prescriptions on File Prior to Visit  Medication Sig Dispense Refill  . Calcium Carbonate-Vitamin D (CALCIUM-VITAMIN D) 600-200 MG-UNIT CAPS Take by mouth 2 (two) times daily.      [name] Sodium 30-100 MG CAPS Take 1 capsule by mouth daily as needed. As needed for constipation    . furosemide (LASIX) 20 MG tablet Take 1 tablet (20 mg total) by mouth daily. 90 tablet 3  . glucose blood (ONE TOUCH ULTRA TEST) test strip TEST TWICE DAILY Dx 250.02 100 each 9  . Lancets MISC One touch delica bid 100 each 1  . lisinopril (PRINIVIL,ZESTRIL) 2.5 MG tablet  Take 1 tablet (2.5 mg total) by mouth daily. 90 tablet 3  . metoprolol tartrate (LOPRESSOR) 25 MG tablet Take 1 tablet (25 mg total) by mouth 2 (two) times daily. 180 tablet 11  . Multiple Vitamin (MULTIVITAMIN) tablet Take 1 tablet by mouth daily.      . sitaGLIPtin (JANUVIA) 100 MG tablet Take 1 tablet (100 mg total) by mouth daily. 30 tablet 5  . vitamin B-12 (CYANOCOBALAMIN) 1000 MCG tablet Take 1,000 mcg by mouth daily.       No current facility-administered medications on file prior to visit.     Past Medical History  Diagnosis Date  . Hyperlipidemia   . Hypertension   . Hemorrhoids   . AVM (arteriovenous malformation)   . Diabetes mellitus, type 2   . Anxiety   . PVD (peripheral vascular disease)   . Osteoporosis   . Vitamin B12 deficiency   . Cataract   . CAD (coronary artery disease)     a. NSTEMI 10/2013 >>> LHC (10/2013):  Distal left main 90%, proximal LAD 30%, mid LAD 20%, ostial circumflex 99%, then 80%, mid RCA 99%, EF 30-35%, 3-4+ MR; proximal right iliac occluded, right renal 50%, ostial left iliac 70% >>> CABG/MV repair  . Mitral regurgitation     a. s/p MV repair at time of CABG  . Atrial fibrillation     post op >>> req'd Amio and Coumadin  . Ischemic cardiomyopathy     a. Echo (11/08/13):  EF 30-35%, diffuse hypokinesis, severe MR, mild LAE, PASP 48 mm Hg  .  Chronic systolic heart failure   . Hx of echocardiogram     Echo (9/15):  Mild LVH, EF 40-45%, inf AK, Gr 1 DD, trivial AI, MV repair ok (mild MS, mild MR), mild LAE, PASP 32 mmHg.  . CHF (congestive heart failure)     Dr. Dorris FetchHendrickson following     Past Surgical History  Procedure Laterality Date  . Appendectomy      1945  . Abdominal hysterectomy      1969  . Hemorrhoid surgery      1959  . Mitral valve replacement (mvr)/coronary artery bypass grafting (cabg) N/A 11/11/2013    Procedure: MITRAL VALVE Ring repair (MVR)/CORONARY ARTERY BYPASS GRAFTING (CABG) times three using left internal mammary  and left sphenous vein.;  Surgeon: Loreli SlotSteven C Hendrickson, MD;  Location: [facility] OR;  Service: Open Heart Surgery;  Laterality: N/A;  Coronary artery bypass graft times three using left internal mammary artery and left saphenous leg vein using endoscope.  Exploration of right leg.   . Left and right heart catheterization with coronary angiogram N/A 11/10/2013    Procedure: LEFT AND RIGHT HEART CATHETERIZATION WITH CORONARY ANGIOGRAM;  Surgeon: Lennette Biharihomas A Kelly, MD;  [facility];  Service: Cardiovascular;  Laterality: N/A;  . Coronary artery bypass graft  1990, 1995, 1998     Family History  Problem Relation Age of Onset  . Cancer Mother     cervical   . Cancer Brother     renal ...x3??  [name]. Hypertension Other   . Stroke Other   . Hypertension Sister   . Heart attack Neg Hx      History   Social History  . Marital Status: Widowed    Spouse Name: N/A  . Number of Children: N/A  . Years of Education: N/A   Occupational History  . retired    Social History Main Topics  . Smoking status: Former Smoker -- 1.00 packs/day for 36 years    Types: Cigarettes    Quit date: 02/18/1984  . Smokeless tobacco: Never Used  . Alcohol Use: No  . Drug Use: No  . Sexual Activity: Not Currently   Other Topics Concern  . Not on file   Social History Narrative   Exercise-- yard, house work     ROS A 10 point review of system was performed. It is negative other than that mentioned in the history of present illness.   PHYSICAL EXAM   BP 168/76 mmHg  Pulse 76  Ht 5\' 2"  (1.575 m)  Wt 127 lb 12.8 oz (57.97 kg)  BMI 23.37 kg/m2 Constitutional: She is oriented to person, place, and time. She appears well-developed and well-nourished. No distress.  HENT: No nasal discharge.  Head: Normocephalic and atraumatic.  Eyes: Pupils are equal and round. No discharge.  Neck: Normal range of motion. Neck supple. No JVD present. No thyromegaly present.  Cardiovascular: Normal rate, regular rhythm,  normal heart sounds. Exam reveals no gallop and no friction rub. No murmur heard.  Pulmonary/Chest: Effort normal and breath sounds normal. No stridor. No respiratory distress. She has no wheezes. She has no rales. She exhibits no tenderness.  Abdominal: Soft. Bowel sounds are normal. She exhibits no distension. There is no tenderness. There is no rebound and no guarding.  Musculoskeletal: Normal range of motion. She exhibits no edema and no tenderness.  Neurological: She is alert and oriented to person, place, and time. Coordination normal.  Skin: Skin is warm and dry. No rash noted. She  is not diaphoretic. No erythema. No pallor.  Psychiatric: She has a normal mood and affect. Her behavior is normal. Judgment and thought content normal.  Vascular: Femoral pulse is not palpable on the right side and normal on the left side. Distal pulses are only palpable on the left side.     ASSESSMENT AND PLAN

## 2014-08-09 NOTE — Assessment & Plan Note (Signed)
She reports no symptoms suggestive of angina. Continue medical therapy. 

## 2014-08-18 ENCOUNTER — Ambulatory Visit (HOSPITAL_COMMUNITY): Payer: Medicare Other | Attending: Cardiovascular Disease | Admitting: Cardiology

## 2014-08-18 DIAGNOSIS — I739 Peripheral vascular disease, unspecified: Secondary | ICD-10-CM | POA: Diagnosis not present

## 2014-08-18 DIAGNOSIS — I700 Atherosclerosis of aorta: Secondary | ICD-10-CM | POA: Insufficient documentation

## 2014-08-18 NOTE — Progress Notes (Signed)
Abdominal Aorta Duplex performed  

## 2014-09-20 ENCOUNTER — Encounter: Payer: Self-pay | Admitting: Cardiovascular Disease

## 2014-09-20 ENCOUNTER — Ambulatory Visit (INDEPENDENT_AMBULATORY_CARE_PROVIDER_SITE_OTHER): Payer: Medicare Other | Admitting: Cardiovascular Disease

## 2014-09-20 VITALS — BP 168/66 | HR 70 | Ht 61.0 in | Wt 126.4 lb

## 2014-09-20 DIAGNOSIS — E785 Hyperlipidemia, unspecified: Secondary | ICD-10-CM | POA: Diagnosis not present

## 2014-09-20 DIAGNOSIS — I739 Peripheral vascular disease, unspecified: Secondary | ICD-10-CM

## 2014-09-20 NOTE — Patient Instructions (Signed)
Medication Instructions:  Your physician recommends that you continue on your current medications as directed. Please refer to the Current Medication list given to you today.  Labwork: No new orders.   Testing/Procedures: No new orders.   Follow-Up: Your physician wants you to follow-up in: 6 MONTHS with Dr Arida.  You will receive a reminder letter in the mail two months in advance. If you don't receive a letter, please call our office to schedule the follow-up appointment.   Any Other Special Instructions Will Be Listed Below (If Applicable).   

## 2014-09-20 NOTE — Progress Notes (Signed)
Primary cardiologist: Dr. Eden Emms  HPI  Emily Daniel is a 81 y.o. female who was is here today for a follow-up visit regarding  peripheral arterial disease. She has known history of CAD (question of remote MI in the 55s), HTN, HL, diabetes, PAD, AVMs, prior tobacco abuse. She was admitted 10/2013 with a NSTEMI c/b acute systolic CHF. Cardiac catheterization demonstrated severe 3 vessel CAD with left main involvement, severe mitral regurgitation and EF was 30-35% by echocardiogram >>> CABG with mitral valve repair as well as closure of PFO by Dr. Dorris Fetch. Postoperative course was complicated by distal emboli to her toes managed conservatively. She had atrial fibrillation and was placed on amiodarone and Coumadin secondary to intermittent atrial fibrillation but was discharged in NSR.  She was seen recently for bilateral leg pain mostly at the vein harvest site in both legs. She underwent noninvasive evaluation which showed normal ABI on the left side and severely reduced on the right and 0.4 with evidence of severe inflow disease. The patient does complain of right hip pain after walking about 100 feet. This forces her to stop and rest for few minutes before she can resume.  I proceeded with an aortoiliac duplex which showed occlusion of the right common iliac artery with significant stenosis affecting the left common iliac artery. The patient used to follow-up with Dr. Darrick Penna for many years for iliac disease that was monitored according to the patient and she brought a previous diagram showing this. The old records are not available. She continues to be very active and overall able to perform activities of daily living including yard work. She does get right hip pain with activities but she does not feel very limited by this.  Allergies  Allergen Reactions  . Meperidine Hcl Nausea And Vomiting  . Sulfonamide Derivatives Nausea And Vomiting     Current Outpatient Prescriptions on File Prior to  Visit  Medication Sig Dispense Refill  . Calcium Carbonate-Vitamin D (CALCIUM-VITAMIN D) 600-200 MG-UNIT CAPS Take 1 capsule by mouth 2 (two) times daily.     [name] Sodium 30-100 MG CAPS Take 1 capsule by mouth daily as needed. As needed for constipation    . furosemide (LASIX) 20 MG tablet Take 1 tablet (20 mg total) by mouth daily. 90 tablet 3  . glucose blood (ONE TOUCH ULTRA TEST) test strip TEST TWICE DAILY Dx 250.02 100 each 9  . Lancets MISC One touch delica bid 100 each 1  . lisinopril (PRINIVIL,ZESTRIL) 2.5 MG tablet Take 1 tablet (2.5 mg total) by mouth daily. 90 tablet 3  . metoprolol tartrate (LOPRESSOR) 25 MG tablet Take 1 tablet (25 mg total) by mouth 2 (two) times daily. 180 tablet 11  . Multiple Vitamin (MULTIVITAMIN) tablet Take 1 tablet by mouth daily.      . sitaGLIPtin (JANUVIA) 100 MG tablet Take 1 tablet (100 mg total) by mouth daily. 30 tablet 5  . vitamin B-12 (CYANOCOBALAMIN) 1000 MCG tablet Take 1,000 mcg by mouth daily.       No current facility-administered medications on file prior to visit.     Past Medical History  Diagnosis Date  . Hyperlipidemia   . Hypertension   . Hemorrhoids   . AVM (arteriovenous malformation)   . Diabetes mellitus, type 2   . Anxiety   . PVD (peripheral vascular disease)   . Osteoporosis   . Vitamin B12 deficiency   . Cataract   . CAD (coronary artery disease)     a.  NSTEMI 10/2013 >>> LHC (10/2013):  Distal left main 90%, proximal LAD 30%, mid LAD 20%, ostial circumflex 99%, then 80%, mid RCA 99%, EF 30-35%, 3-4+ MR; proximal right iliac occluded, right renal 50%, ostial left iliac 70% >>> CABG/MV repair  . Mitral regurgitation     a. s/p MV repair at time of CABG  . Atrial fibrillation     post op >>> req'd Amio and Coumadin  . Ischemic cardiomyopathy     a. Echo (11/08/13):  EF 30-35%, diffuse hypokinesis, severe MR, mild LAE, PASP 48 mm Hg  . Chronic systolic heart failure   . Hx of echocardiogram      Echo (9/15):  Mild LVH, EF 40-45%, inf AK, Gr 1 DD, trivial AI, MV repair ok (mild MS, mild MR), mild LAE, PASP 32 mmHg.  . CHF (congestive heart failure)     Dr. Dorris FetchHendrickson following     Past Surgical History  Procedure Laterality Date  . Appendectomy      1945  . Abdominal hysterectomy      1969  . Hemorrhoid surgery      1959  . Mitral valve replacement (mvr)/coronary artery bypass grafting (cabg) N/A 11/11/2013    Procedure: MITRAL VALVE Ring repair (MVR)/CORONARY ARTERY BYPASS GRAFTING (CABG) times three using left internal mammary and left sphenous vein.;  Surgeon: Loreli SlotSteven C Hendrickson, MD;  Location: [facility] OR;  Service: Open Heart Surgery;  Laterality: N/A;  Coronary artery bypass graft times three using left internal mammary artery and left saphenous leg vein using endoscope.  Exploration of right leg.   . Left and right heart catheterization with coronary angiogram N/A 11/10/2013    Procedure: LEFT AND RIGHT HEART CATHETERIZATION WITH CORONARY ANGIOGRAM;  Surgeon: Lennette Biharihomas A Kelly, MD;  [name];  Service: Cardiovascular;  Laterality: N/A;  . Coronary artery bypass graft  1990, 1995, 1998     Family History  Problem Relation Age of Onset  . Cancer Mother     cervical   . Cancer Brother     renal ...x3??  [name]. Hypertension Other   . Stroke Other   . Hypertension Sister   . Heart attack Neg Hx      History   Social History  . Marital Status: Widowed    Spouse Name: N/A  . Number of Children: N/A  . Years of Education: N/A   Occupational History  . retired    Social History Main Topics  . Smoking status: Former Smoker -- 1.00 packs/day for 36 years    Types: Cigarettes    Quit date: 02/18/1984  . Smokeless tobacco: Never Used  . Alcohol Use: No  . Drug Use: No  . Sexual Activity: Not Currently   Other Topics Concern  . Not on file   Social History Narrative   Exercise-- yard, house work     ROS A 10 point review of system was performed. It is  negative other than that mentioned in the history of present illness.   PHYSICAL EXAM   BP 168/66 mmHg  Pulse 70  Ht 5\' 1"  (1.549 m)  Wt 126 lb 6.4 oz (57.335 kg)  BMI 23.90 kg/m2 Constitutional: She is oriented to person, place, and time. She appears well-developed and well-nourished. No distress.  HENT: No nasal discharge.  Head: Normocephalic and atraumatic.  Eyes: Pupils are equal and round. No discharge.  Neck: Normal range of motion. Neck supple. No JVD present. No thyromegaly present.  Cardiovascular: Normal rate, regular rhythm,  normal heart sounds. Exam reveals no gallop and no friction rub. No murmur heard.  Pulmonary/Chest: Effort normal and breath sounds normal. No stridor. No respiratory distress. She has no wheezes. She has no rales. She exhibits no tenderness.  Abdominal: Soft. Bowel sounds are normal. She exhibits no distension. There is no tenderness. There is no rebound and no guarding.  Musculoskeletal: Normal range of motion. She exhibits no edema and no tenderness.  Neurological: She is alert and oriented to person, place, and time. Coordination normal.  Skin: Skin is warm and dry. No rash noted. She is not diaphoretic. No erythema. No pallor.  Psychiatric: She has a normal mood and affect. Her behavior is normal. Judgment and thought content normal.  Vascular: Femoral pulse is not palpable on the right side and normal on the left side. Distal pulses are only palpable on the left side.     ASSESSMENT AND PLAN

## 2014-09-21 NOTE — Assessment & Plan Note (Signed)
There is reported history of intolerance to statins due to myalgia. However, symptoms might be related to claudication more than side effects to statins. Given her known history of coronary artery disease and peripheral arterial disease, there is a strong indication for treatment with a statin.

## 2014-09-21 NOTE — Assessment & Plan Note (Signed)
The patient has moderate right leg claudication due to an occluded right common iliac artery. I suspect that this is the culprit for her right lower back and hip exertional pain. I discussed with her different management options including continued medical therapy versus attempted endovascular intervention. He had recurrent symptoms do not seem to be lifestyle limiting at the present time and she continues to be functional. Thus, it's probably reasonable to continue with medical therapy and observation for now.

## 2014-10-21 ENCOUNTER — Ambulatory Visit (INDEPENDENT_AMBULATORY_CARE_PROVIDER_SITE_OTHER): Payer: Medicare Other | Admitting: Family Medicine

## 2014-10-21 ENCOUNTER — Encounter: Payer: Self-pay | Admitting: Family Medicine

## 2014-10-21 VITALS — BP 148/60 | HR 67 | Temp 99.1°F | Wt 127.0 lb

## 2014-10-21 DIAGNOSIS — D508 Other iron deficiency anemias: Secondary | ICD-10-CM | POA: Diagnosis not present

## 2014-10-21 DIAGNOSIS — Z954 Presence of other heart-valve replacement: Secondary | ICD-10-CM

## 2014-10-21 DIAGNOSIS — E1159 Type 2 diabetes mellitus with other circulatory complications: Secondary | ICD-10-CM

## 2014-10-21 DIAGNOSIS — E785 Hyperlipidemia, unspecified: Secondary | ICD-10-CM | POA: Diagnosis not present

## 2014-10-21 DIAGNOSIS — Z0100 Encounter for examination of eyes and vision without abnormal findings: Secondary | ICD-10-CM

## 2014-10-21 DIAGNOSIS — E119 Type 2 diabetes mellitus without complications: Secondary | ICD-10-CM | POA: Diagnosis not present

## 2014-10-21 DIAGNOSIS — I10 Essential (primary) hypertension: Secondary | ICD-10-CM | POA: Diagnosis not present

## 2014-10-21 DIAGNOSIS — Z952 Presence of prosthetic heart valve: Secondary | ICD-10-CM

## 2014-10-21 DIAGNOSIS — I214 Non-ST elevation (NSTEMI) myocardial infarction: Secondary | ICD-10-CM

## 2014-10-21 MED ORDER — SITAGLIPTIN PHOSPHATE 100 MG PO TABS: 100.0000 mg | ORAL_TABLET | Freq: Every day | ORAL | Status: DC

## 2014-10-21 NOTE — Assessment & Plan Note (Signed)
Check labs Cardiology stopped statin due to muscle aches---they have improved some

## 2014-10-21 NOTE — Assessment & Plan Note (Signed)
Per cardiology 

## 2014-10-21 NOTE — Assessment & Plan Note (Signed)
con't janumet con't glucose check qd Check labs

## 2014-10-21 NOTE — Progress Notes (Signed)
Patient ID: Natale Milch, female    DOB: 1929/06/03  Age: 81 y.o. MRN: [id number]    Subjective:  Subjective HPI MICHAELENE DUTAN presents for f/u dm, cholesterol and bp.    HYPERTENSION  Blood pressure range-not checking  Chest pain- no      Dyspnea- no Lightheadedness- no   Edema- no Other side effects - no   Medication compliance: good Low salt diet- yes  DIABETES  Blood Sugar ranges-good per pt  Polyuria- no New Visual problems- no Hypoglycemic symptoms- no Other side effects-no Medication compliance - good Last eye exam- due Foot exam- today  HYPERLIPIDEMIA  Medication compliance- good RUQ pain- no  Muscle aches- no Other side effects-no    Review of Systems  Constitutional: Negative for diaphoresis, appetite change, fatigue and unexpected weight change.  Eyes: Negative for pain, redness and visual disturbance.  Respiratory: Negative for cough, chest tightness, shortness of breath and wheezing.   Cardiovascular: Negative for chest pain, palpitations and leg swelling.  Endocrine: Negative for cold intolerance, heat intolerance, polydipsia, polyphagia and polyuria.  Genitourinary: Negative for dysuria, frequency and difficulty urinating.  Neurological: Negative for dizziness, light-headedness, numbness and headaches.   . History Past Medical History  Diagnosis Date  . Hyperlipidemia   . Hypertension   . Hemorrhoids   . AVM (arteriovenous malformation)   . Diabetes mellitus, type 2   . Anxiety   . PVD (peripheral vascular disease)   . Osteoporosis   . Vitamin B12 deficiency   . Cataract   . CAD (coronary artery disease)     a. NSTEMI 10/2013 >>> LHC (10/2013):  Distal left main 90%, proximal LAD 30%, mid LAD 20%, ostial circumflex 99%, then 80%, mid RCA 99%, EF 30-35%, 3-4+ MR; proximal right iliac occluded, right renal 50%, ostial left iliac 70% >>> CABG/MV repair  . Mitral regurgitation     a. s/p MV repair at time of CABG  . Atrial fibrillation    post op >>> req'd Amio and Coumadin  . Ischemic cardiomyopathy     a. Echo (11/08/13):  EF 30-35%, diffuse hypokinesis, severe MR, mild LAE, PASP 48 mm Hg  . Chronic systolic heart failure   . Hx of echocardiogram     Echo (9/15):  Mild LVH, EF 40-45%, inf AK, Gr 1 DD, trivial AI, MV repair ok (mild MS, mild MR), mild LAE, PASP 32 mmHg.  . CHF (congestive heart failure)     Dr. Dorris Fetch following    She has past surgical history that includes Appendectomy; Abdominal hysterectomy; Hemorrhoid surgery; Mitral valve replacement (mvr)/coronary artery bypass grafting (cabg) (N/A, 11/11/2013); left and right heart catheterization with coronary angiogram (N/A, 11/10/2013); and Coronary artery bypass graft (1990, 1995, 1998).   Her family history includes Cancer in her brother and mother; Hypertension in her other and sister; Stroke in her other. There is no history of Heart attack.She reports that she quit smoking about 30 years ago. Her smoking use included Cigarettes. She has a 36 pack-year smoking history. She has never used smokeless tobacco. She reports that she does not drink alcohol or use illicit drugs.  Current Outpatient Prescriptions on File Prior to Visit  Medication Sig Dispense Refill  . aspirin 325 MG tablet Take 325 mg by mouth daily.    . Calcium Carbonate-Vitamin D (CALCIUM-VITAMIN D) 600-200 MG-UNIT CAPS Take 1 capsule by mouth 2 (two) times daily.     [name] Sodium 30-100 MG CAPS Take 1 capsule by mouth daily as needed.  As needed for constipation    . furosemide (LASIX) 20 MG tablet Take 1 tablet (20 mg total) by mouth daily. 90 tablet 3  . glucose blood (ONE TOUCH ULTRA TEST) test strip TEST TWICE DAILY Dx 250.02 100 each 9  . Lancets MISC One touch delica bid 100 each 1  . lisinopril (PRINIVIL,ZESTRIL) 2.5 MG tablet Take 1 tablet (2.5 mg total) by mouth daily. 90 tablet 3  . metoprolol tartrate (LOPRESSOR) 25 MG tablet Take 1 tablet (25 mg total) by mouth 2 (two)  times daily. 180 tablet 11  . Multiple Vitamin (MULTIVITAMIN) tablet Take 1 tablet by mouth daily.      . vitamin B-12 (CYANOCOBALAMIN) 1000 MCG tablet Take 1,000 mcg by mouth daily.       No current facility-administered medications on file prior to visit.     Objective:  Objective Physical Exam  Constitutional: She is oriented to person, place, and time. She appears well-developed and well-nourished.  HENT:  Head: Normocephalic and atraumatic.  Eyes: Conjunctivae and EOM are normal.  Neck: Normal range of motion. Neck supple. No JVD present. Carotid bruit is not present. No thyromegaly present.  Cardiovascular: Normal rate, regular rhythm and normal heart sounds.   No murmur heard. Pulmonary/Chest: Effort normal and breath sounds normal. No respiratory distress. She has no wheezes. She has no rales. She exhibits no tenderness.  Musculoskeletal: She exhibits no edema.  Neurological: She is alert and oriented to person, place, and time.  Psychiatric: She has a normal mood and affect.  Sensory exam of the foot is normal, tested with the monofilament. Good pulses, no lesions or ulcers, good peripheral pulses.   BP 148/60 mmHg  Pulse 67  Temp(Src) 99.1 F (37.3 C) (Oral)  Wt 127 lb (57.607 kg)  SpO2 97% Wt Readings from Last 3 Encounters:  10/21/14 127 lb (57.607 kg)  09/20/14 126 lb 6.4 oz (57.335 kg)  08/09/14 127 lb 12.8 oz (57.97 kg)     Lab Results  Component Value Date   WBC 9.2 11/19/2013   HGB 9.7* 11/19/2013   HCT 30.2* 11/19/2013   PLT 207 11/19/2013   GLUCOSE 66* 04/19/2014   CHOL 97 03/17/2014   TRIG 78.0 03/17/2014   HDL 37.10* 03/17/2014   LDLDIRECT 156.0 03/13/2009   LDLCALC 44 03/17/2014   ALT 17 03/17/2014   AST 23 03/17/2014   NA 140 04/19/2014   K 4.2 04/19/2014   CL 106 04/19/2014   CREATININE 1.2 04/19/2014   BUN 26* 04/19/2014   CO2 23 04/19/2014   TSH 0.942 11/17/2013   INR 2.5 05/02/2014   HGBA1C 5.8 03/17/2014   MICROALBUR 1.5  04/01/2013    Dg Chest 2 View  12/14/2013   CLINICAL DATA:  History of mitral valve replacement, CABG 10/2013  EXAM: CHEST  2 VIEW  COMPARISON:  11/19/2013  FINDINGS: The lungs are hyperinflated likely secondary to COPD. There are trace bilateral pleural effusions. There is no focal consolidation or pneumothorax. There is stable cardiomegaly. There is evidence of prior CABG mitral valve replacement.  The osseous structures are unremarkable.  IMPRESSION: Trace bilateral pleural effusions, otherwise no active cardiopulmonary disease.   Electronically Signed   By: Elige Ko   On: 12/14/2013 10:39     Assessment & Plan:  Plan I am having Ms. Deriso maintain her Calcium-Vitamin D, multivitamin, vitamin B-12, Casanthranol-Docusate Sodium, lisinopril, furosemide, Lancets, glucose blood, metoprolol tartrate, aspirin, and sitaGLIPtin.  Meds ordered this encounter  Medications  . sitaGLIPtin (JANUVIA)  100 MG tablet    Sig: Take 1 tablet (100 mg total) by mouth daily.    Dispense:  90 tablet    Refill:  3    Problem List Items Addressed This Visit    Type 2 diabetes mellitus with circulatory disorder    con't janumet con't glucose check qd Check labs      Relevant Medications   sitaGLIPtin (JANUVIA) 100 MG tablet   NSTEMI (non-ST elevated myocardial infarction)    Per cardiology       Hyperlipidemia    Check labs Cardiology stopped statin due to muscle aches---they have improved some      Essential hypertension    Stable con't meds      Relevant Orders   Basic metabolic panel   CBC with Differential/Platelet    Other Visit Diagnoses    Diabetes mellitus type II, controlled    -  Primary    Relevant Medications    sitaGLIPtin (JANUVIA) 100 MG tablet    Other Relevant Orders    Hemoglobin A1c    Microalbumin / creatinine urine ratio    POCT urinalysis dipstick    Diabetic eye exam        Relevant Medications    sitaGLIPtin (JANUVIA) 100 MG tablet    Other Relevant  Orders    Ambulatory referral to Ophthalmology    Hyperlipidemia LDL goal <70        Relevant Orders    Hepatic function panel    Lipid panel    H/O mitral valve replacement        Diabetes type 2, controlled        Relevant Medications    sitaGLIPtin (JANUVIA) 100 MG tablet       Follow-up: Return in about 6 months (around 04/23/2015), or if symptoms worsen or fail to improve, for hypertension, hyperlipidemia, diabetes II, lipid, hep bmp hgba1c, ua, microalbumin, fasting.  Loreen FreudYvonne Lowne, DO

## 2014-10-21 NOTE — Assessment & Plan Note (Signed)
Stable con't meds 

## 2014-10-21 NOTE — Progress Notes (Signed)
Pre visit review using our clinic review tool, if applicable. No additional management support is needed unless otherwise documented below in the visit note. 

## 2014-10-21 NOTE — Patient Instructions (Signed)

## 2014-10-25 ENCOUNTER — Other Ambulatory Visit (INDEPENDENT_AMBULATORY_CARE_PROVIDER_SITE_OTHER): Payer: Medicare Other

## 2014-10-25 DIAGNOSIS — E785 Hyperlipidemia, unspecified: Secondary | ICD-10-CM

## 2014-10-25 DIAGNOSIS — E119 Type 2 diabetes mellitus without complications: Secondary | ICD-10-CM | POA: Diagnosis not present

## 2014-10-25 DIAGNOSIS — I10 Essential (primary) hypertension: Secondary | ICD-10-CM | POA: Diagnosis not present

## 2014-10-25 LAB — BASIC METABOLIC PANEL
BUN: 26 mg/dL — ABNORMAL HIGH (ref 6–23)
CALCIUM: 8.9 mg/dL (ref 8.4–10.5)
CO2: 25 mEq/L (ref 19–32)
Chloride: 109 mEq/L (ref 96–112)
Creatinine, Ser: 1.26 mg/dL — ABNORMAL HIGH (ref 0.40–1.20)
GFR: 43.13 mL/min — AB (ref >60.00)
Glucose, Bld: 98 mg/dL (ref 70–99)
Potassium: 3.9 mEq/L (ref 3.5–5.1)
SODIUM: 140 meq/L (ref 135–145)

## 2014-10-25 LAB — HEMOGLOBIN A1C: HEMOGLOBIN A1C: 5 % (ref 4.6–6.5)

## 2014-10-25 LAB — CBC WITH DIFFERENTIAL/PLATELET
Basophils Absolute: 0 10*3/uL (ref 0.0–0.1)
Basophils Relative: 0.6 % (ref 0.0–3.0)
EOS ABS: 0.4 10*3/uL (ref 0.0–0.7)
Eosinophils Relative: 5.9 % — ABNORMAL HIGH (ref 0.0–5.0)
HCT: 25.1 % — ABNORMAL LOW (ref 36.0–46.0)
Hemoglobin: 8.2 g/dL — ABNORMAL LOW (ref 12.0–15.0)
LYMPHS PCT: 32.1 % (ref 12.0–46.0)
Lymphs Abs: 2.1 10*3/uL (ref 0.7–4.0)
MCHC: 32.5 g/dL (ref 30.0–36.0)
MCV: 101 fl — ABNORMAL HIGH (ref 78.0–100.0)
Monocytes Absolute: 0.9 10*3/uL (ref 0.1–1.0)
Monocytes Relative: 13.3 % — ABNORMAL HIGH (ref 3.0–12.0)
NEUTROS PCT: 48.1 % (ref 43.0–77.0)
Neutro Abs: 3.1 10*3/uL (ref 1.4–7.7)
Platelets: 201 10*3/uL (ref 150.0–400.0)
RBC: 2.49 Mil/uL — ABNORMAL LOW (ref 3.87–5.11)
RDW: 13.2 % (ref 11.5–15.5)
WBC: 6.5 10*3/uL (ref 4.0–10.5)

## 2014-10-25 LAB — LIPID PANEL
CHOL/HDL RATIO: 4
Cholesterol: 154 mg/dL (ref 0–200)
HDL: 41.9 mg/dL (ref >39.00)
LDL Cholesterol: 99 mg/dL (ref 0–99)
NONHDL: 112.1
Triglycerides: 64 mg/dL (ref 0.0–149.0)
VLDL: 12.8 mg/dL (ref 0.0–40.0)

## 2014-10-25 LAB — MICROALBUMIN / CREATININE URINE RATIO
Creatinine,U: 76 mg/dL
MICROALB UR: 1.6 mg/dL (ref 0.0–1.9)
Microalb Creat Ratio: 2.1 mg/g (ref 0.0–30.0)

## 2014-10-25 LAB — HEPATIC FUNCTION PANEL
ALT: 14 U/L (ref 0–35)
AST: 16 U/L (ref 0–37)
Albumin: 3.8 g/dL (ref 3.5–5.2)
Alkaline Phosphatase: 43 U/L (ref 39–117)
BILIRUBIN DIRECT: 0 mg/dL (ref 0.0–0.3)
BILIRUBIN TOTAL: 0.4 mg/dL (ref 0.2–1.2)
Total Protein: 6.4 g/dL (ref 6.0–8.3)

## 2014-10-26 ENCOUNTER — Other Ambulatory Visit: Payer: Self-pay

## 2014-10-26 DIAGNOSIS — D649 Anemia, unspecified: Secondary | ICD-10-CM

## 2014-10-27 ENCOUNTER — Other Ambulatory Visit: Payer: Self-pay

## 2014-10-27 MED ORDER — RANITIDINE HCL 150 MG PO TABS: 150.0000 mg | ORAL_TABLET | Freq: Two times a day (BID) | ORAL | Status: DC

## 2014-11-02 LAB — POC HEMOCCULT BLD/STL (HOME/3-CARD/SCREEN)
Card #2 Fecal Occult Blod, POC: NEGATIVE
FECAL OCCULT BLD: NEGATIVE
Fecal Occult Blood, POC: NEGATIVE

## 2014-11-02 NOTE — Addendum Note (Signed)
Addended by: Eustace QuailEABOLD, Hasina Kreager J on: 11/02/2014 03:44 PM   Modules accepted: Orders

## 2014-11-02 NOTE — Addendum Note (Signed)
Addended by: Eustace QuailEABOLD, Esraa Seres J on: 11/02/2014 03:43 PM   Modules accepted: Orders

## 2014-11-04 LAB — POCT URINALYSIS DIPSTICK
BILIRUBIN UA: NEGATIVE
GLUCOSE UA: NEGATIVE
Ketones, UA: NEGATIVE
LEUKOCYTES UA: NEGATIVE
Nitrite, UA: NEGATIVE
Protein, UA: NEGATIVE
RBC UA: NEGATIVE
SPEC GRAV UA: 1.025
Urobilinogen, UA: 4
pH, UA: 6

## 2014-11-29 DIAGNOSIS — H3531 Nonexudative age-related macular degeneration: Secondary | ICD-10-CM | POA: Diagnosis not present

## 2014-11-29 DIAGNOSIS — Z961 Presence of intraocular lens: Secondary | ICD-10-CM | POA: Diagnosis not present

## 2014-11-29 DIAGNOSIS — E119 Type 2 diabetes mellitus without complications: Secondary | ICD-10-CM | POA: Diagnosis not present

## 2014-11-29 DIAGNOSIS — H35373 Puckering of macula, bilateral: Secondary | ICD-10-CM | POA: Diagnosis not present

## 2014-11-29 LAB — HM DIABETES EYE EXAM

## 2014-12-06 NOTE — Progress Notes (Signed)
Patient ID: Emily Daniel, female   DOB: 1929/06/12, 81 y.o.   MRN: [id number]  Primary cardiologist: Dr. Eden EmmsNishan  HPI  Emily MilchMary R Daniel is a 81 y.o.  female who was is here today for a follow-up visit regarding  CAD and PVD  She has known history of CAD (question of remote MI in the 581980s), HTN, HL, diabetes, PAD, AVMs, prior tobacco abuse. She was admitted 10/2013 with a NSTEMI c/b acute systolic CHF. Cardiac catheterization demonstrated severe 3 vessel CAD with left main involvement, severe mitral regurgitation and EF was 30-35% by echocardiogram >>>  CABG with mitral valve repair as well as closure of PFO by Dr. Dorris FetchHendrickson. 11/13/13  PROCEDURE: Median sternotomy, extracorporeal circulation, coronary artery bypass grafting x3 (left internal mammary artery to left anterior descending, saphenous vein graft to obtuse marginal 1, saphenous vein graft to distal right coronary), mitral valve repair with Sorin memo 3D 30 mm annuloplasty ring, serial # [id number], closure of patent foramen ovale.   Postoperative course was complicated by distal emboli to her toes managed conservatively. She had atrial fibrillation and was placed on amiodarone and Coumadin secondary to intermittent atrial fibrillation but was discharged in NSR.   She was seen recently for bilateral leg pain mostly at the vein harvest site in both legs. She underwent noninvasive evaluation which showed normal ABI on the left side and severely reduced on the right and 0.4 with evidence of severe inflow disease. The patient does complain of right hip pain after walking about 100 feet. This forces her to stop and rest for few minutes before she can resume.   Dr Kirke CorinArida ordered an  aortoiliac duplex which showed occlusion of the right common iliac artery with significant stenosis affecting the left common iliac artery. The patient used to follow-up with Dr. Darrick Pennafields for many years for iliac disease that was monitored according to the patient  She  continues to be very active and overall able to perform activities of daily living including yard work. She does get right hip pain with activities but she does not feel very limited by this.  Allergies  Allergen Reactions  . Meperidine Hcl Nausea And Vomiting  . Sulfonamide Derivatives Nausea And Vomiting     Current Outpatient Prescriptions on File Prior to Visit  Medication Sig Dispense Refill  . aspirin 325 MG tablet Take 325 mg by mouth daily.    . Calcium Carbonate-Vitamin D (CALCIUM-VITAMIN D) 600-200 MG-UNIT CAPS Take 1 capsule by mouth 2 (two) times daily.     [name]. Casanthranol-Docusate Sodium 30-100 MG CAPS Take 1 capsule by mouth daily as needed. As needed for constipation    . furosemide (LASIX) 20 MG tablet Take 1 tablet (20 mg total) by mouth daily. 90 tablet 3  . glucose blood (ONE TOUCH ULTRA TEST) test strip TEST TWICE DAILY Dx 250.02 100 each 9  . Lancets MISC One touch delica bid 100 each 1  . lisinopril (PRINIVIL,ZESTRIL) 2.5 MG tablet Take 1 tablet (2.5 mg total) by mouth daily. 90 tablet 3  . metoprolol tartrate (LOPRESSOR) 25 MG tablet Take 1 tablet (25 mg total) by mouth 2 (two) times daily. 180 tablet 11  . Multiple Vitamin (MULTIVITAMIN) tablet Take 1 tablet by mouth daily.      . sitaGLIPtin (JANUVIA) 100 MG tablet Take 1 tablet (100 mg total) by mouth daily. 90 tablet 3  . vitamin B-12 (CYANOCOBALAMIN) 1000 MCG tablet Take 1,000 mcg by mouth daily.      . ranitidine (  ZANTAC) 150 MG tablet Take 1 tablet (150 mg total) by mouth 2 (two) times daily. (Patient not taking: Reported on 12/07/2014) 60 tablet 0   No current facility-administered medications on file prior to visit.     Past Medical History  Diagnosis Date  . Hyperlipidemia   . Hypertension   . Hemorrhoids   . AVM (arteriovenous malformation)   . Diabetes mellitus, type 2   . Anxiety   . PVD (peripheral vascular disease)   . Osteoporosis   . Vitamin B12 deficiency   . Cataract   . CAD (coronary  artery disease)     a. NSTEMI 10/2013 >>> LHC (10/2013):  Distal left main 90%, proximal LAD 30%, mid LAD 20%, ostial circumflex 99%, then 80%, mid RCA 99%, EF 30-35%, 3-4+ MR; proximal right iliac occluded, right renal 50%, ostial left iliac 70% >>> CABG/MV repair  . Mitral regurgitation     a. s/p MV repair at time of CABG  . Atrial fibrillation     post op >>> req'd Amio and Coumadin  . Ischemic cardiomyopathy     a. Echo (11/08/13):  EF 30-35%, diffuse hypokinesis, severe MR, mild LAE, PASP 48 mm Hg  . Chronic systolic heart failure   . Hx of echocardiogram     Echo (9/15):  Mild LVH, EF 40-45%, inf AK, Gr 1 DD, trivial AI, MV repair ok (mild MS, mild MR), mild LAE, PASP 32 mmHg.  . CHF (congestive heart failure)     Dr. Dorris Fetch following     Past Surgical History  Procedure Laterality Date  . Appendectomy      1945  . Abdominal hysterectomy      1969  . Hemorrhoid surgery      1959  . Mitral valve replacement (mvr)/coronary artery bypass grafting (cabg) N/A 11/11/2013    Procedure: MITRAL VALVE Ring repair (MVR)/CORONARY ARTERY BYPASS GRAFTING (CABG) times three using left internal mammary and left sphenous vein.;  Surgeon: Loreli Slot, MD;  Location: [facility] OR;  Service: Open Heart Surgery;  Laterality: N/A;  Coronary artery bypass graft times three using left internal mammary artery and left saphenous leg vein using endoscope.  Exploration of right leg.   . Left and right heart catheterization with coronary angiogram N/A 11/10/2013    Procedure: LEFT AND RIGHT HEART CATHETERIZATION WITH CORONARY ANGIOGRAM;  Surgeon: Lennette Bihari, MD;  Location: [facility] CATH LAB;  Service: Cardiovascular;  Laterality: N/A;  . Coronary artery bypass graft  1990, 1995, 1998     Family History  Problem Relation Age of Onset  . Cancer Mother     cervical   . Cancer Brother     renal ...x3??  [name] Hypertension Other   . Stroke Other   . Hypertension Sister   . Heart attack Neg Hx       History   Social History  . Marital Status: Widowed    Spouse Name: N/A  . Number of Children: N/A  . Years of Education: N/A   Occupational History  . retired    Social History Main Topics  . Smoking status: Former Smoker -- 1.00 packs/day for 36 years    Types: Cigarettes    Quit date: 02/18/1984  . Smokeless tobacco: Never Used  . Alcohol Use: No  . Drug Use: No  . Sexual Activity: Not Currently   Other Topics Concern  . Not on file   Social History Narrative   Exercise-- yard, house work    ECG:  SR rate 67  PR 236    ROS A 10 point review of system was performed. It is negative other than that mentioned in the history of present illness.   PHYSICAL EXAM   BP 140/58 mmHg  Pulse 67  Ht  (1.549 m)  Wt 56.155 kg (123 lb 12.8 oz)  BMI 23.40 kg/m2 Constitutional: She is oriented to person, place, and time. She appears well-developed and well-nourished. No distress.  HENT: No nasal discharge.  Head: Normocephalic and atraumatic.  Eyes: Pupils are equal and round. No discharge.  Neck: Normal range of motion. Neck supple. No JVD present. No thyromegaly present.  Cardiovascular: Normal rate, regular rhythm, normal heart sounds. Exam reveals no gallop and no friction rub. No murmur heard.  Pulmonary/Chest: Effort normal and breath sounds normal. No stridor. No respiratory distress. She has no wheezes. She has no rales. She exhibits no tenderness.  Abdominal: Soft. Bowel sounds are normal. She exhibits no distension. There is no tenderness. There is no rebound and no guarding.  Musculoskeletal: Normal range of motion. She exhibits no edema and no tenderness.  Neurological: She is alert and oriented to person, place, and time. Coordination normal.  Skin: Skin is warm and dry. No rash noted. She is not diaphoretic. No erythema. No pallor.  Psychiatric: She has a normal mood and affect. Her behavior is normal. Judgment and thought content normal.  Vascular:  Femoral pulse is not palpable on the right side and normal on the left side. Distal pulses are only palpable on the left side.     ASSESSMENT AND PLAN CAD Stable with no angina and good activity level.  Continue medical Rx CABG 2015 MV Repair no residual murmur excellent result  SBE PVD stable RLE f/u Arida  No indication for angio at this time Chol  At goal  Lab Results  Component Value Date   LDLCALC 99 10/25/2014    HTN Well controlled.  Continue current medications and low sodium Dash type diet.    F/U with me in 6 months and Dr Kirke Corin for PVD

## 2014-12-07 ENCOUNTER — Ambulatory Visit (INDEPENDENT_AMBULATORY_CARE_PROVIDER_SITE_OTHER): Payer: Medicare Other | Admitting: Cardiovascular Disease

## 2014-12-07 ENCOUNTER — Encounter: Payer: Self-pay | Admitting: Cardiovascular Disease

## 2014-12-07 ENCOUNTER — Other Ambulatory Visit: Payer: Self-pay | Admitting: Physician Assistant

## 2014-12-07 VITALS — BP 140/58 | HR 67 | Ht 61.0 in | Wt 123.8 lb

## 2014-12-07 DIAGNOSIS — I480 Paroxysmal atrial fibrillation: Secondary | ICD-10-CM | POA: Diagnosis not present

## 2014-12-07 NOTE — Patient Instructions (Addendum)
Medication Instructions:  NO CHANGES  Labwork: NONE  Testing/Procedures: NONE  Follow-Up: Your physician wants you to follow-up in: 6 MONTHS WITH DR NISHAN You will receive a reminder letter in the mail two months in advance. If you don't receive a letter, please call our office to schedule the follow-up appointment.  Any Other Special Instructions Will Be Listed Below (If Applicable).   

## 2014-12-09 ENCOUNTER — Telehealth: Payer: Self-pay | Admitting: Family Medicine

## 2014-12-09 DIAGNOSIS — E119 Type 2 diabetes mellitus without complications: Secondary | ICD-10-CM

## 2014-12-09 MED ORDER — LANCETS MISC: Status: DC

## 2014-12-09 NOTE — Telephone Encounter (Signed)
Relation to pt: self  Call back number: [phone] [facility]  [facility] [place] [address] [address] [phone])         Reason for call:  Patients requesting a refill Lancets MISC for her one touch ultra

## 2014-12-15 ENCOUNTER — Encounter: Payer: Self-pay | Admitting: Gastroenterology

## 2014-12-15 ENCOUNTER — Other Ambulatory Visit (INDEPENDENT_AMBULATORY_CARE_PROVIDER_SITE_OTHER): Payer: Medicare Other

## 2014-12-15 ENCOUNTER — Ambulatory Visit (INDEPENDENT_AMBULATORY_CARE_PROVIDER_SITE_OTHER): Payer: Medicare Other | Admitting: Gastroenterology

## 2014-12-15 VITALS — BP 180/68 | HR 72 | Ht 60.0 in

## 2014-12-15 DIAGNOSIS — K5520 Angiodysplasia of colon without hemorrhage: Secondary | ICD-10-CM

## 2014-12-15 DIAGNOSIS — E538 Deficiency of other specified B group vitamins: Secondary | ICD-10-CM | POA: Diagnosis not present

## 2014-12-15 DIAGNOSIS — Q2733 Arteriovenous malformation of digestive system vessel: Secondary | ICD-10-CM | POA: Diagnosis not present

## 2014-12-15 DIAGNOSIS — D649 Anemia, unspecified: Secondary | ICD-10-CM

## 2014-12-15 LAB — VITAMIN B12: VITAMIN B 12: 930 pg/mL — AB (ref 211–911)

## 2014-12-15 LAB — FERRITIN: Ferritin: 18.9 ng/mL (ref 10.0–291.0)

## 2014-12-15 LAB — IBC PANEL
IRON: 42 ug/dL (ref 42–145)
SATURATION RATIOS: 10 % — AB (ref 20.0–50.0)
Transferrin: 301 mg/dL (ref 212.0–360.0)

## 2014-12-15 LAB — FOLATE: Folate: >24.8 ng/mL (ref >5.9)

## 2014-12-15 NOTE — Progress Notes (Signed)
    History of Present Illness: This is an 81 year old female with multiple comorbidities referred by Emily Perla, DO for the evaluation of anemia and heme negative stool. Recent CBC with Hb=8.2, MCV=101. She has had Fe deficiency noted in the past. Currently takes oral B12 and has taken Fe in the past. She underwent colonoscopy in 06/2012 with a single AVMs noted in the ascending colon and internal hemorrhoids. Denies weight loss, abdominal pain, constipation, diarrhea, change in stool caliber, melena, hematochezia, nausea, vomiting, dysphagia, reflux symptoms, chest pain.  Review of Systems: Pertinent positive and negative review of systems were noted in the above HPI section. All other review of systems were otherwise negative.  Current Medications, Allergies, Past Medical History, Past Surgical History, Family History and Social History were reviewed in [employer] record.  Physical Exam: General: Well developed, well nourished, no acute distress Head: Normocephalic and atraumatic Eyes:  sclerae anicteric, EOMI Ears: Normal auditory acuity Mouth: No deformity or lesions Neck: Supple, no masses or thyromegaly Lungs: Clear throughout to auscultation Heart: Regular rate and rhythm; no murmurs, rubs or bruits Abdomen: Soft, non tender and non distended. No masses, hepatosplenomegaly or hernias noted. Normal Bowel sounds Rectal: deferred Musculoskeletal: Symmetrical with no gross deformities  Skin: No lesions on visible extremities Pulses:  Normal pulses noted Extremities: No clubbing, cyanosis, edema or deformities noted Neurological: Alert oriented x 4, grossly nonfocal Cervical Nodes:  No significant cervical adenopathy Inguinal Nodes: No significant inguinal adenopathy Psychological:  Alert and cooperative. Normal mood and affect  Assessment and Recommendations:  1. Macrocytic anemia. Heme negative stool. Likely has intermittent occult GI blood loss from  known colonic AVM leading to Fe deficiency. Send standard anemia studies. I would anticipate recurrent Fe deficiency given AVM. No plans to repeat colonoscopy or proceed with EGD at this time. Follow up with Dr. Laury Axon.   cc: Emily Perla, DO [address] [place], [place] [address]

## 2014-12-15 NOTE — Patient Instructions (Addendum)
Your physician has requested that you go to the basement for lab work before leaving today.  Thank you for choosing me and [facility].  Venita Lick. Pleas Koch., MD., Clementeen Graham  cc: Loreen Freud, DO

## 2015-03-03 ENCOUNTER — Other Ambulatory Visit: Payer: Self-pay | Admitting: Family Medicine

## 2015-03-03 ENCOUNTER — Telehealth: Payer: Self-pay | Admitting: Family Medicine

## 2015-03-03 MED ORDER — GLUCOSE BLOOD VI STRP: ORAL_STRIP | Status: DC

## 2015-03-03 NOTE — Telephone Encounter (Signed)
Relation to ZO:XWRU Call back number:[phone] Pharmacy: [facility] - [place], [place] - [address] [phone] ([phone] (Fax)         Reason for call:  Patient requesting a refill one touch ultra blue test strips

## 2015-03-03 NOTE — Telephone Encounter (Signed)
Sent to pharmacy.  LM for patient

## 2015-04-27 ENCOUNTER — Telehealth: Payer: Self-pay | Admitting: Family Medicine

## 2015-04-27 DIAGNOSIS — M810 Age-related osteoporosis without current pathological fracture: Secondary | ICD-10-CM

## 2015-04-27 DIAGNOSIS — Z780 Asymptomatic menopausal state: Secondary | ICD-10-CM

## 2015-04-27 NOTE — Telephone Encounter (Signed)
Relation to pt: self Call back number: [phone]    Reason for call:  Patient requesting orders for bone density at [facility] scheduled appointment for 05/30/15 at 2:15pm

## 2015-05-01 NOTE — Telephone Encounter (Signed)
Orders placed.

## 2015-05-08 ENCOUNTER — Ambulatory Visit: Payer: Medicare Other | Admitting: Family Medicine

## 2015-05-16 ENCOUNTER — Encounter: Payer: Self-pay | Admitting: Family Medicine

## 2015-05-16 ENCOUNTER — Ambulatory Visit (INDEPENDENT_AMBULATORY_CARE_PROVIDER_SITE_OTHER): Payer: Medicare Other | Admitting: Family Medicine

## 2015-05-16 VITALS — BP 130/78 | HR 64 | Temp 98.4°F | Ht 60.0 in | Wt 129.4 lb

## 2015-05-16 DIAGNOSIS — I10 Essential (primary) hypertension: Secondary | ICD-10-CM

## 2015-05-16 DIAGNOSIS — E1151 Type 2 diabetes mellitus with diabetic peripheral angiopathy without gangrene: Secondary | ICD-10-CM

## 2015-05-16 DIAGNOSIS — Z23 Encounter for immunization: Secondary | ICD-10-CM | POA: Diagnosis not present

## 2015-05-16 DIAGNOSIS — D509 Iron deficiency anemia, unspecified: Secondary | ICD-10-CM | POA: Diagnosis not present

## 2015-05-16 DIAGNOSIS — E785 Hyperlipidemia, unspecified: Secondary | ICD-10-CM | POA: Diagnosis not present

## 2015-05-16 LAB — COMPREHENSIVE METABOLIC PANEL
ALK PHOS: 41 U/L (ref 39–117)
ALT: 10 U/L (ref 0–35)
AST: 13 U/L (ref 0–37)
Albumin: 4 g/dL (ref 3.5–5.2)
BILIRUBIN TOTAL: 0.5 mg/dL (ref 0.2–1.2)
BUN: 23 mg/dL (ref 6–23)
CO2: 26 mEq/L (ref 19–32)
Calcium: 9.7 mg/dL (ref 8.4–10.5)
Chloride: 108 mEq/L (ref 96–112)
Creatinine, Ser: 1.18 mg/dL (ref 0.40–1.20)
GFR: 46.45 mL/min — ABNORMAL LOW (ref >60.00)
GLUCOSE: 118 mg/dL — AB (ref 70–99)
POTASSIUM: 5.1 meq/L (ref 3.5–5.1)
SODIUM: 142 meq/L (ref 135–145)
TOTAL PROTEIN: 6.8 g/dL (ref 6.0–8.3)

## 2015-05-16 LAB — LIPID PANEL
CHOLESTEROL: 194 mg/dL (ref 0–200)
HDL: 43.9 mg/dL (ref >39.00)
LDL Cholesterol: 131 mg/dL — ABNORMAL HIGH (ref 0–99)
NonHDL: 149.95
Total CHOL/HDL Ratio: 4
Triglycerides: 96 mg/dL (ref 0.0–149.0)
VLDL: 19.2 mg/dL (ref 0.0–40.0)

## 2015-05-16 LAB — CBC WITH DIFFERENTIAL/PLATELET
Basophils Absolute: 0 10*3/uL (ref 0.0–0.1)
Basophils Relative: 0.5 % (ref 0.0–3.0)
EOS PCT: 4.6 % (ref 0.0–5.0)
Eosinophils Absolute: 0.3 10*3/uL (ref 0.0–0.7)
HCT: 32.4 % — ABNORMAL LOW (ref 36.0–46.0)
Hemoglobin: 10.8 g/dL — ABNORMAL LOW (ref 12.0–15.0)
LYMPHS ABS: 1.7 10*3/uL (ref 0.7–4.0)
Lymphocytes Relative: 27.7 % (ref 12.0–46.0)
MCHC: 33.4 g/dL (ref 30.0–36.0)
MCV: 103 fl — ABNORMAL HIGH (ref 78.0–100.0)
MONO ABS: 0.8 10*3/uL (ref 0.1–1.0)
Monocytes Relative: 13.5 % — ABNORMAL HIGH (ref 3.0–12.0)
NEUTROS PCT: 53.7 % (ref 43.0–77.0)
Neutro Abs: 3.3 10*3/uL (ref 1.4–7.7)
PLATELETS: 194 10*3/uL (ref 150.0–400.0)
RBC: 3.14 Mil/uL — ABNORMAL LOW (ref 3.87–5.11)
RDW: 13.1 % (ref 11.5–15.5)
WBC: 6.2 10*3/uL (ref 4.0–10.5)

## 2015-05-16 LAB — FERRITIN: FERRITIN: 22 ng/mL (ref 10.0–291.0)

## 2015-05-16 LAB — IBC PANEL
IRON: 76 ug/dL (ref 42–145)
SATURATION RATIOS: 19.3 % — AB (ref 20.0–50.0)
TRANSFERRIN: 282 mg/dL (ref 212.0–360.0)

## 2015-05-16 LAB — HEMOGLOBIN A1C: HEMOGLOBIN A1C: 5.1 % (ref 4.6–6.5)

## 2015-05-16 NOTE — Patient Instructions (Signed)

## 2015-05-16 NOTE — Progress Notes (Signed)
Patient ID: Emily Daniel, female    DOB: 08/21/1929  Age: 81 y.o. MRN: [id number]    Subjective:  Subjective HPI SHAKIRRA BUEHLER presents for dm, htn and cholesterol  HPI HYPERTENSION  Blood pressure range-not checking   Chest pain- no      Dyspnea- no Lightheadedness- no   Edema- no Other side effects - no   Medication compliance: good Low salt diet- yes  DIABETES  Blood Sugar ranges-90-110  Polyuria- no New Visual problems- no Hypoglycemic symptoms- no Other side effects-no Medication compliance - good Last eye exam- 10/2014 Foot exam- today  HYPERLIPIDEMIA  Medication compliance- no RUQ pain- no  Muscle aches- no Other side effects-no   Review of Systems  Constitutional: Negative for diaphoresis, appetite change, fatigue and unexpected weight change.  Eyes: Negative for pain, redness and visual disturbance.  Respiratory: Negative for cough, chest tightness, shortness of breath and wheezing.   Cardiovascular: Negative for chest pain, palpitations and leg swelling.  Endocrine: Negative for cold intolerance, heat intolerance, polydipsia, polyphagia and polyuria.  Genitourinary: Negative for dysuria, frequency and difficulty urinating.  Neurological: Negative for dizziness, light-headedness, numbness and headaches.   . History Past Medical History  Diagnosis Date  . Hyperlipidemia   . Hypertension   . Hemorrhoids   . AVM (arteriovenous malformation)   . Diabetes mellitus, type 2 ([place])   . Anxiety   . PVD (peripheral vascular disease) ([place])   . Osteoporosis   . Vitamin B12 deficiency   . Cataract   . CAD (coronary artery disease)     a. NSTEMI 10/2013 >>> LHC (10/2013):  Distal left main 90%, proximal LAD 30%, mid LAD 20%, ostial circumflex 99%, then 80%, mid RCA 99%, EF 30-35%, 3-4+ MR; proximal right iliac occluded, right renal 50%, ostial left iliac 70% >>> CABG/MV repair  . Mitral regurgitation     a. s/p MV repair at time of CABG  . Atrial fibrillation  ([place])     post op >>> req'd Amio and Coumadin  . Ischemic cardiomyopathy     a. Echo (11/08/13):  EF 30-35%, diffuse hypokinesis, severe MR, mild LAE, PASP 48 mm Hg  . Chronic systolic heart failure ([place])   . Hx of echocardiogram     Echo (9/15):  Mild LVH, EF 40-45%, inf AK, Gr 1 DD, trivial AI, MV repair ok (mild MS, mild MR), mild LAE, PASP 32 mmHg.  . CHF (congestive heart failure) ([place])     Dr. Roxan Hockey following    She has past surgical history that includes Appendectomy; Abdominal hysterectomy; Hemorrhoid surgery; Mitral valve replacement (mvr)/coronary artery bypass grafting (cabg) (N/A, 11/11/2013); left and right heart catheterization with coronary angiogram (N/A, 11/10/2013); and Coronary artery bypass graft (1990, 1995, 1998).   Her family history includes Cancer in her brother; Cervical cancer in her mother; Hypertension in her other and sister; Stroke in her other. There is no history of Heart attack.She reports that she quit smoking about 31 years ago. Her smoking use included Cigarettes. She has a 36 pack-year smoking history. She has never used smokeless tobacco. She reports that she does not drink alcohol or use illicit drugs.  Current Outpatient Prescriptions on File Prior to Visit  Medication Sig Dispense Refill  . aspirin 325 MG tablet Take 325 mg by mouth daily.    . Calcium Carbonate-Vitamin D (CALCIUM-VITAMIN D) 600-200 MG-UNIT CAPS Take 1 capsule by mouth 2 (two) times daily.     Sarajane Marek Sodium 30-100 MG CAPS Take 1  capsule by mouth daily as needed. As needed for constipation    . furosemide (LASIX) 20 MG tablet Take 1 tablet (20 mg total) by mouth daily. 90 tablet 3  . glucose blood (ONE TOUCH ULTRA TEST) test strip USE ONE STRIP TO CHECK GLUCOSE TWICE DAILY 100 each 2  . Lancets MISC One touch delica to check blood sugar twice a day. Dx: E11.9 100 each 1  . lisinopril (PRINIVIL,ZESTRIL) 2.5 MG tablet TAKE ONE TABLET BY MOUTH ONCE DAILY 90 tablet 1  .  metoprolol tartrate (LOPRESSOR) 25 MG tablet Take 1 tablet (25 mg total) by mouth 2 (two) times daily. 180 tablet 11  . Multiple Vitamin (MULTIVITAMIN) tablet Take 1 tablet by mouth daily.      . sitaGLIPtin (JANUVIA) 100 MG tablet Take 1 tablet (100 mg total) by mouth daily. 90 tablet 3  . vitamin B-12 (CYANOCOBALAMIN) 1000 MCG tablet Take 1,000 mcg by mouth daily.       No current facility-administered medications on file prior to visit.     Objective:  Objective Physical Exam  Constitutional: She is oriented to person, place, and time. She appears well-developed and well-nourished.  HENT:  Head: Normocephalic and atraumatic.  Eyes: Conjunctivae and EOM are normal.  Neck: Normal range of motion. Neck supple. No JVD present. Carotid bruit is not present. No thyromegaly present.  Cardiovascular: Normal rate, regular rhythm and normal heart sounds.   No murmur heard. Pulmonary/Chest: Effort normal and breath sounds normal. No respiratory distress. She has no wheezes. She has no rales. She exhibits no tenderness.  Musculoskeletal: She exhibits no edema.  Neurological: She is alert and oriented to person, place, and time.  Psychiatric: She has a normal mood and affect.  Nursing note and vitals reviewed. Sensory exam of the foot is normal, tested with the monofilament. Good pulses, no lesions or ulcers, good peripheral pulses.   BP 130/78 mmHg  Pulse 64  Temp(Src) 98.4 F (36.9 C) (Oral)  Ht 5' (1.524 m)  Wt 129 lb 6.4 oz (58.695 kg)  BMI 25.27 kg/m2  SpO2 98% Wt Readings from Last 3 Encounters:  05/16/15 129 lb 6.4 oz (58.695 kg)  12/07/14 123 lb 12.8 oz (56.155 kg)  10/21/14 127 lb (57.607 kg)     Lab Results  Component Value Date   WBC 6.5 10/25/2014   HGB 8.2 Repeated and verified X2.* 10/25/2014   HCT 25.1 Repeated and verified X2.* 10/25/2014   PLT 201.0 10/25/2014   GLUCOSE 98 10/25/2014   CHOL 154 10/25/2014   TRIG 64.0 10/25/2014   HDL 41.90 10/25/2014    LDLDIRECT 156.0 03/13/2009   LDLCALC 99 10/25/2014   ALT 14 10/25/2014   AST 16 10/25/2014   NA 140 10/25/2014   K 3.9 10/25/2014   CL 109 10/25/2014   CREATININE 1.26* 10/25/2014   BUN 26* 10/25/2014   CO2 25 10/25/2014   TSH 0.942 11/17/2013   INR 2.5 05/02/2014   HGBA1C 5.0 10/25/2014   MICROALBUR 1.6 10/25/2014    Dg Chest 2 View  12/14/2013  CLINICAL DATA:  History of mitral valve replacement, CABG 10/2013 EXAM: CHEST  2 VIEW COMPARISON:  11/19/2013 FINDINGS: The lungs are hyperinflated likely secondary to COPD. There are trace bilateral pleural effusions. There is no focal consolidation or pneumothorax. There is stable cardiomegaly. There is evidence of prior CABG mitral valve replacement. The osseous structures are unremarkable. IMPRESSION: Trace bilateral pleural effusions, otherwise no active cardiopulmonary disease. Electronically Signed   By: Elbert Ewings  Patel   On: 12/14/2013 10:39     Assessment & Plan:  Plan I am having Ms. Masser maintain her Calcium-Vitamin D, multivitamin, vitamin B-12, Casanthranol-Docusate Sodium, furosemide, metoprolol tartrate, aspirin, sitaGLIPtin, lisinopril, Lancets, and glucose blood.  No orders of the defined types were placed in this encounter.    Problem List Items Addressed This Visit    Essential hypertension    Other Visit Diagnoses    DM (diabetes mellitus) type II controlled peripheral vascular disorder ([place])    -  Primary    Relevant Orders    Comp Met (CMET)    Hemoglobin A1c    Need for immunization against influenza        Relevant Orders    Flu vaccine HIGH DOSE PF (Fluzone High dose) (Completed)    Comp Met (CMET)    Hemoglobin A1c    Hyperlipidemia LDL goal <70        Relevant Orders    Comp Met (CMET)    Lipid panel    Anemia, iron deficiency        Relevant Orders    CBC with Differential/Platelet    IBC panel    Ferritin       Follow-up: Return in about 6 months (around 11/14/2015), or if symptoms worsen or  fail to improve, for hypertension, hyperlipidemia, diabetes II.  Garnet Koyanagi, DO

## 2015-05-16 NOTE — Progress Notes (Signed)
Pre visit review using our clinic review tool, if applicable. No additional management support is needed unless otherwise documented below in the visit note. 

## 2015-05-16 NOTE — Assessment & Plan Note (Signed)
con't daily iron Pt  Dec ti 1 tab on her own

## 2015-05-28 ENCOUNTER — Other Ambulatory Visit: Payer: Self-pay | Admitting: Cardiovascular Disease

## 2015-05-30 ENCOUNTER — Other Ambulatory Visit: Payer: Self-pay

## 2015-05-30 ENCOUNTER — Telehealth: Payer: Self-pay | Admitting: *Deleted

## 2015-05-30 DIAGNOSIS — Z1231 Encounter for screening mammogram for malignant neoplasm of breast: Secondary | ICD-10-CM | POA: Diagnosis not present

## 2015-05-30 DIAGNOSIS — M810 Age-related osteoporosis without current pathological fracture: Secondary | ICD-10-CM | POA: Diagnosis not present

## 2015-05-30 LAB — HM MAMMOGRAPHY: HM Mammogram: NEGATIVE

## 2015-05-30 MED ORDER — METOPROLOL TARTRATE 25 MG PO TABS: 25.0000 mg | ORAL_TABLET | Freq: Two times a day (BID) | ORAL | Status: DC

## 2015-05-30 MED ORDER — LISINOPRIL 2.5 MG PO TABS: 2.5000 mg | ORAL_TABLET | Freq: Every day | ORAL | Status: DC

## 2015-05-30 NOTE — Telephone Encounter (Signed)
said [facility] didnt recieve lisinopril will call and verify.  Spoke with May, verified they received it and put it on hold, ask them to call pt and let her know, they are in agreement with plan.

## 2015-05-30 NOTE — Telephone Encounter (Signed)
Emily StadePeter C Nishan, MD at 12/06/2014 9:05 AM  lisinopril (PRINIVIL,ZESTRIL) 2.5 MG tabletTake 1 tablet (2.5 mg total) by mouth daily metoprolol tartrate (LOPRESSOR) 25 MG tabletTake 1 tablet (25 mg total) by mouth 2 (two) times daily Patient Instructions     Medication Instructions:  NO CHANGES

## 2015-06-07 ENCOUNTER — Encounter: Payer: Self-pay | Admitting: Family Medicine

## 2015-06-14 ENCOUNTER — Encounter: Payer: Self-pay | Admitting: *Deleted

## 2015-06-15 NOTE — Progress Notes (Signed)
Patient ID: Emily Daniel, female   DOB: May 10, 1930, 82 y.o.   MRN: [id number]  Primary cardiologist: Dr. Eden Emms  HPI  Emily Daniel is a 82 y.o.  female who was is here today for a follow-up visit regarding  CAD and PVD  She has known history of CAD (question of remote MI in the 70s), HTN, HL, diabetes, PAD, AVMs, prior tobacco abuse. She was admitted 10/2013 with a NSTEMI c/b acute systolic CHF. Cardiac catheterization demonstrated severe 3 vessel CAD with left main involvement, severe mitral regurgitation and EF was 30-35% by echocardiogram >>>  CABG with mitral valve repair as well as closure of PFO by Dr. Dorris Fetch. 11/13/13  PROCEDURE: Median sternotomy, extracorporeal circulation, coronary artery bypass grafting x3 (left internal mammary artery to left anterior descending, saphenous vein graft to obtuse marginal 1, saphenous vein graft to distal right coronary), mitral valve repair with Sorin memo 3D 30 mm annuloplasty ring, serial # [id number], closure of patent foramen ovale.   Postoperative course was complicated by distal emboli to her toes managed conservatively. She had atrial fibrillation and was placed on amiodarone and Coumadin secondary to intermittent atrial fibrillation but was discharged in NSR.   She was seen recently for bilateral leg pain mostly at the vein harvest site in both legs. She underwent noninvasive evaluation which showed normal ABI on the left side and severely reduced on the right and 0.4 with evidence of severe inflow disease. The patient does complain of right hip pain after walking about 100 feet. This forces her to stop and rest for few minutes before she can resume.   Dr Kirke Corin ordered an  aortoiliac duplex which showed occlusion of the right common iliac artery with significant stenosis affecting the left common iliac artery. The patient used to follow-up with Dr. Darrick Penna for many years for iliac disease that was monitored according to the patient  She  continues to be very active and overall able to perform activities of daily living including yard work. She does get right hip pain with activities but she does not feel very limited by this.  Allergies  Allergen Reactions  . Meperidine Hcl Nausea And Vomiting  . Sulfonamide Derivatives Nausea And Vomiting  . Demerol [Meperidine]     Unknown per pt      Current Outpatient Prescriptions on File Prior to Visit  Medication Sig Dispense Refill  . aspirin 325 MG tablet Take 325 mg by mouth daily.    . Calcium Carbonate-Vitamin D (CALCIUM-VITAMIN D) 600-200 MG-UNIT CAPS Take 1 capsule by mouth 2 (two) times daily.     [name] Sodium 30-100 MG CAPS Take 1 capsule by mouth daily as needed (for constipation).     . furosemide (LASIX) 20 MG tablet Take 1 tablet (20 mg total) by mouth daily. 90 tablet 3  . lisinopril (PRINIVIL,ZESTRIL) 2.5 MG tablet Take 1 tablet (2.5 mg total) by mouth daily. 90 tablet 1  . metoprolol tartrate (LOPRESSOR) 25 MG tablet Take 1 tablet (25 mg total) by mouth 2 (two) times daily. 180 tablet 1  . Multiple Vitamin (MULTIVITAMIN) tablet Take 1 tablet by mouth daily.      . sitaGLIPtin (JANUVIA) 100 MG tablet Take 1 tablet (100 mg total) by mouth daily. 90 tablet 3  . vitamin B-12 (CYANOCOBALAMIN) 1000 MCG tablet Take 1,000 mcg by mouth daily.       No current facility-administered medications on file prior to visit.     Past Medical History  Diagnosis Date  .  Hyperlipidemia   . Hypertension   . Hemorrhoids   . AVM (arteriovenous malformation)   . Diabetes mellitus, type 2 ([facility])   . Anxiety   . PVD (peripheral vascular disease) ([facility])   . Osteoporosis   . Vitamin B12 deficiency   . Cataract   . CAD (coronary artery disease)     a. NSTEMI 10/2013 >>> LHC (10/2013):  Distal left main 90%, proximal LAD 30%, mid LAD 20%, ostial circumflex 99%, then 80%, mid RCA 99%, EF 30-35%, 3-4+ MR; proximal right iliac occluded, right renal 50%, ostial left iliac  70% >>> CABG/MV repair  . Mitral regurgitation     a. s/p MV repair at time of CABG  . Atrial fibrillation ([facility])     post op >>> req'd Amio and Coumadin  . Ischemic cardiomyopathy     a. Echo (11/08/13):  EF 30-35%, diffuse hypokinesis, severe MR, mild LAE, PASP 48 mm Hg  . Chronic systolic heart failure ([facility])   . Hx of echocardiogram     Echo (9/15):  Mild LVH, EF 40-45%, inf AK, Gr 1 DD, trivial AI, MV repair ok (mild MS, mild MR), mild LAE, PASP 32 mmHg.  . CHF (congestive heart failure) ([facility])     Dr. Dorris Fetch following     Past Surgical History  Procedure Laterality Date  . Appendectomy      1945  . Abdominal hysterectomy      1969  . Hemorrhoid surgery      1959  . Mitral valve replacement (mvr)/coronary artery bypass grafting (cabg) N/A 11/11/2013    Procedure: MITRAL VALVE Ring repair (MVR)/CORONARY ARTERY BYPASS GRAFTING (CABG) times three using left internal mammary and left sphenous vein.;  Surgeon: Loreli Slot, MD;  Location: [facility] OR;  Service: Open Heart Surgery;  Laterality: N/A;  Coronary artery bypass graft times three using left internal mammary artery and left saphenous leg vein using endoscope.  Exploration of right leg.   . Left and right heart catheterization with coronary angiogram N/A 11/10/2013    Procedure: LEFT AND RIGHT HEART CATHETERIZATION WITH CORONARY ANGIOGRAM;  Surgeon: Lennette Bihari, MD;  Location[facility] C[facility] LAB;  Service: Cardiovascular;  Laterality: N/A;  . Coronary artery bypass graft  1990, 1995, 1998     Family History  Problem Relation Age of Onset  . Cervical cancer Mother   . Cancer Brother     renal ...x3??  [name] Hypertension Other   . Stroke Other   . Hypertension Sister   . Heart attack Neg Hx      Social History   Social History  . Marital Status: Widowed    Spouse Name: N/A  . Number of Children: N/A  . Years of Education: N/A   Occupational History  . retired    Social History Main Topics  . Smoking status:  Former Smoker -- 1.00 packs/day for 36 years    Types: Cigarettes    Quit date: 02/18/1984  . Smokeless tobacco: Never Used  . Alcohol Use: No  . Drug Use: No  . Sexual Activity: Not Currently   Other Topics Concern  . Not on file   Social History Narrative   Exercise-- yard, house work    ECG:  SR rate 67  PR 236    ROS A 10 point review of system was performed. It is negative other than that mentioned in the history of present illness.   PHYSICAL EXAM   BP 168/62 mmHg  Pulse 100  Ht   (1.549 m)  Wt 58.514 kg (129 lb)  BMI 24.39 kg/m2  SpO2 93% Constitutional: She is oriented to person, place, and time. She appears well-developed and well-nourished. No distress.  HENT: No nasal discharge.  Head: Normocephalic and atraumatic.  Eyes: Pupils are equal and round. No discharge.  Neck: Normal range of motion. Neck supple. No JVD present. No thyromegaly present.  Cardiovascular: Normal rate, regular rhythm, normal heart sounds. Exam reveals no gallop and no friction rub. No murmur heard.  Pulmonary/Chest: Effort normal and breath sounds normal. No stridor. No respiratory distress. She has no wheezes. She has no rales. She exhibits no tenderness.  Abdominal: Soft. Bowel sounds are normal. She exhibits no distension. There is no tenderness. There is no rebound and no guarding.  Musculoskeletal: Normal range of motion. She exhibits no edema and no tenderness.  Neurological: She is alert and oriented to person, place, and time. Coordination normal.  Skin: Skin is warm and dry. No rash noted. She is not diaphoretic. No erythema. No pallor.  Psychiatric: She has a normal mood and affect. Her behavior is normal. Judgment and thought content normal.  Vascular: Femoral pulse is not palpable on the right side and normal on the left side. Distal pulses are only palpable on the left side.     ASSESSMENT AND PLAN CAD Stable with no angina and good activity level.  Continue medical Rx  CABG 2015 MV Repair no residual murmur excellent result  SBE PVD stable RLE f/u Arida  No indication for angio at this time f/u with him April with ABI's Chol  At goal  Lab Results  Component Value Date   LDLCALC 131* 05/16/2015    HTN elevated increase Zestril 10 mg and increase lopressor 50 bid if claudication worse can consider sympathomimetic drug  F/U with me in 6 months and Dr Kirke Corin for PVD Last seen by him 09/20/14

## 2015-06-16 ENCOUNTER — Ambulatory Visit (INDEPENDENT_AMBULATORY_CARE_PROVIDER_SITE_OTHER): Payer: Medicare Other | Admitting: Cardiovascular Disease

## 2015-06-16 ENCOUNTER — Encounter: Payer: Self-pay | Admitting: Cardiovascular Disease

## 2015-06-16 VITALS — BP 168/62 | HR 100 | Ht 61.0 in | Wt 129.0 lb

## 2015-06-16 DIAGNOSIS — I4891 Unspecified atrial fibrillation: Secondary | ICD-10-CM | POA: Diagnosis not present

## 2015-06-16 DIAGNOSIS — I10 Essential (primary) hypertension: Secondary | ICD-10-CM

## 2015-06-16 MED ORDER — METOPROLOL TARTRATE 50 MG PO TABS
50.0000 mg | ORAL_TABLET | Freq: Two times a day (BID) | ORAL | Status: DC
Start: 2015-06-16 — End: 2016-01-12

## 2015-06-16 MED ORDER — LISINOPRIL 10 MG PO TABS: 10.0000 mg | ORAL_TABLET | Freq: Every day | ORAL | Status: DC

## 2015-06-16 NOTE — Patient Instructions (Addendum)
Medication Instructions:  Your physician has recommended you make the following change in your medication:  1-increase Zestril 10 mg by mouth daily 2-increase Lopressor 50 mg by mouth twice daily.  Labwork: NONE  Testing/Procedures: NONE  Follow-Up: Your physician recommends that you schedule a follow-up appointment in: April with Dr. Kirke Corin.  Your physician wants you to follow-up in: 6 months with Dr. Eden Emms. You will receive a reminder letter in the mail two months in advance. If you don't receive a letter, please call our office to schedule the follow-up appointment.   If you need a refill on your cardiac medications before your next appointment, please call your pharmacy.

## 2015-06-19 ENCOUNTER — Telehealth: Payer: Self-pay | Admitting: Family Medicine

## 2015-06-19 NOTE — Telephone Encounter (Signed)
Pt has questions about Januvia. She isn't sure she is going to keep taking it. Had problem with [employer] application to provide. She sent in in Dec and they told her they haven't received it. She would like to discuss med use.

## 2015-06-20 NOTE — Telephone Encounter (Signed)
Patient said she would actually pay for it on her own and she will call when she is ready for a refill.    KP

## 2015-06-20 NOTE — Telephone Encounter (Signed)
Spoke with patient and she stated she filed out the paper and mailed it to [employer] and was told that they have not received the form in the mail. She said she doesn't not want to continue with the patient assistance program and she wanted to just stop the Januvia. Please advise    KP

## 2015-07-11 ENCOUNTER — Other Ambulatory Visit: Payer: Self-pay | Admitting: Family Medicine

## 2015-11-29 ENCOUNTER — Encounter: Payer: Self-pay | Admitting: Cardiovascular Disease

## 2015-11-30 ENCOUNTER — Encounter: Payer: Self-pay | Admitting: Family Medicine

## 2015-12-05 DIAGNOSIS — H40053 Ocular hypertension, bilateral: Secondary | ICD-10-CM | POA: Diagnosis not present

## 2015-12-05 DIAGNOSIS — H353131 Nonexudative age-related macular degeneration, bilateral, early dry stage: Secondary | ICD-10-CM | POA: Diagnosis not present

## 2015-12-05 DIAGNOSIS — E119 Type 2 diabetes mellitus without complications: Secondary | ICD-10-CM | POA: Diagnosis not present

## 2015-12-05 DIAGNOSIS — Z961 Presence of intraocular lens: Secondary | ICD-10-CM | POA: Diagnosis not present

## 2015-12-25 ENCOUNTER — Ambulatory Visit: Payer: Self-pay | Admitting: Cardiovascular Disease

## 2016-01-03 ENCOUNTER — Ambulatory Visit (INDEPENDENT_AMBULATORY_CARE_PROVIDER_SITE_OTHER): Payer: Medicare Other | Admitting: Cardiovascular Disease

## 2016-01-03 ENCOUNTER — Encounter: Payer: Self-pay | Admitting: Cardiovascular Disease

## 2016-01-03 VITALS — BP 160/62 | HR 72 | Ht 60.0 in | Wt 126.0 lb

## 2016-01-03 DIAGNOSIS — I059 Rheumatic mitral valve disease, unspecified: Secondary | ICD-10-CM

## 2016-01-03 DIAGNOSIS — I4891 Unspecified atrial fibrillation: Secondary | ICD-10-CM

## 2016-01-03 NOTE — Patient Instructions (Addendum)
Medication Instructions:  Your physician recommends that you continue on your current medications as directed. Please refer to the Current Medication list given to you today.  Labwork: NONE  Testing/Procedures: NONE  Follow-Up: Your physician wants you to follow-up in: next available with Dr. Nishan.    If you need a refill on your cardiac medications before your next appointment, please call your pharmacy.    

## 2016-01-03 NOTE — Progress Notes (Signed)
Patient ID: Emily Daniel, female   DOB: 02/27/1930, 82 y.o.   MRN: [id number]  Primary cardiologist: Dr. Eden Emms  HPI  Emily Daniel is a 82 y.o.  female who was is here today for a follow-up visit regarding  CAD and PVD  She has known history of CAD (question of remote MI in the 85s), HTN, HL, diabetes, PAD, AVMs, prior tobacco abuse. She was admitted 10/2013 with a NSTEMI c/b acute systolic CHF. Cardiac catheterization demonstrated severe 3 vessel CAD with left main involvement, severe mitral regurgitation and EF was 30-35% by echocardiogram >>>  CABG with mitral valve repair as well as closure of PFO by Dr. Dorris Fetch. 11/13/13  PROCEDURE: Median sternotomy, extracorporeal circulation, coronary artery bypass grafting x3 (left internal mammary artery to left anterior descending, saphenous vein graft to obtuse marginal 1, saphenous vein graft to distal right coronary), mitral valve repair with Sorin memo 3D 30 mm annuloplasty ring, serial # [id number], closure of patent foramen ovale.   Postoperative course was complicated by distal emboli to her toes managed conservatively. She had atrial fibrillation and was placed on amiodarone and Coumadin secondary to intermittent atrial fibrillation but was discharged in NSR.   She was seen for bilateral leg pain mostly at the vein harvest site in both legs. She underwent noninvasive evaluation which showed normal ABI on the left side and severely reduced on the right and 0.4 with evidence of severe inflow disease. The patient does complain of right hip pain after walking about 100 feet. This forces her to stop and rest for few minutes before she can resume.   Dr Kirke Corin ordered an  aortoiliac duplex which showed occlusion of the right common iliac artery with significant stenosis affecting the left common iliac artery. The patient used to follow-up with Dr. Darrick Penna for many years for iliac disease that was monitored according to the patient  She continues  to be very active and overall able to perform activities of daily living including yard work. She does get right hip pain with activities but she does not feel very limited by this.  She takes BP at home in am and they are fine High in office today  Compliant with meds   Allergies  Allergen Reactions  . Meperidine Hcl Nausea And Vomiting  . Sulfonamide Derivatives Nausea And Vomiting  . Demerol [Meperidine]     Unknown per pt      Current Outpatient Prescriptions on File Prior to Visit  Medication Sig Dispense Refill  . aspirin 325 MG tablet Take 325 mg by mouth daily.    . Calcium Carbonate-Vitamin D (CALCIUM-VITAMIN D) 600-200 MG-UNIT CAPS Take 1 capsule by mouth 2 (two) times daily.     Emily Daniel Sodium 30-100 MG CAPS Take 1 capsule by mouth daily as needed (for constipation).     . furosemide (LASIX) 20 MG tablet Take 1 tablet (20 mg total) by mouth daily. 90 tablet 3  . JANUVIA 100 MG tablet TAKE ONE TABLET BY MOUTH ONCE DAILY 30 tablet 5  . lisinopril (PRINIVIL,ZESTRIL) 10 MG tablet Take 1 tablet (10 mg total) by mouth daily. 90 tablet 3  . metoprolol tartrate (LOPRESSOR) 50 MG tablet Take 1 tablet (50 mg total) by mouth 2 (two) times daily. 180 tablet 3  . Multiple Vitamin (MULTIVITAMIN) tablet Take 1 tablet by mouth daily.      . vitamin B-12 (CYANOCOBALAMIN) 1000 MCG tablet Take 1,000 mcg by mouth daily.       No current  facility-administered medications on file prior to visit.      Past Medical History:  Diagnosis Date  . Anxiety   . Atrial fibrillation (HCC)    post op >>> req'd Amio and Coumadin  . AVM (arteriovenous malformation)   . CAD (coronary artery disease)    a. NSTEMI 10/2013 >>> LHC (10/2013):  Distal left main 90%, proximal LAD 30%, mid LAD 20%, ostial circumflex 99%, then 80%, mid RCA 99%, EF 30-35%, 3-4+ MR; proximal right iliac occluded, right renal 50%, ostial left iliac 70% >>> CABG/MV repair  . Cataract   . CHF (congestive heart  failure) (HCC)    Dr. Dorris FetchHendrickson following  . Chronic systolic heart failure (HCC)   . Diabetes mellitus, type 2 (HCC)   . Hemorrhoids   . Hx of echocardiogram    Echo (9/15):  Mild LVH, EF 40-45%, inf AK, Gr 1 DD, trivial AI, MV repair ok (mild MS, mild MR), mild LAE, PASP 32 mmHg.  [name]. Hyperlipidemia   . Hypertension   . Ischemic cardiomyopathy    a. Echo (11/08/13):  EF 30-35%, diffuse hypokinesis, severe MR, mild LAE, PASP 48 mm Hg  . Mitral regurgitation    a. s/p MV repair at time of CABG  . Osteoporosis   . PVD (peripheral vascular disease) (HCC)   . Vitamin B12 deficiency      Past Surgical History:  Procedure Laterality Date  . ABDOMINAL HYSTERECTOMY     1969  . APPENDECTOMY     1945  . CORONARY ARTERY BYPASS GRAFT  1990, 1995, 1998  . HEMORRHOID SURGERY     1959  . LEFT AND RIGHT HEART CATHETERIZATION WITH CORONARY ANGIOGRAM N/A 11/10/2013   Procedure: LEFT AND RIGHT HEART CATHETERIZATION WITH CORONARY ANGIOGRAM;  Surgeon: Lennette Biharihomas A Kelly, MD;  [facility];  Service: Cardiovascular;  Laterality: N/A;  . MITRAL VALVE REPLACEMENT (MVR)/CORONARY ARTERY BYPASS GRAFTING (CABG) N/A 11/11/2013   Procedure: MITRAL VALVE Ring repair (MVR)/CORONARY ARTERY BYPASS GRAFTING (CABG) times three using left internal mammary and left sphenous vein.;  Surgeon: Loreli SlotSteven C Hendrickson, MD;  Location: [facility] OR;  Service: Open Heart Surgery;  Laterality: N/A;  Coronary artery bypass graft times three using left internal mammary artery and left saphenous leg vein using endoscope.  Exploration of right leg.      Family History  Problem Relation Age of Onset  . Cervical cancer Mother   . Cancer Brother     renal ...x3??  [name]. Hypertension Other   . Stroke Other   . Hypertension Sister   . Heart attack Neg Hx      Social History   Social History  . Marital status: Widowed    Spouse name: N/A  . Number of children: N/A  . Years of education: N/A   Occupational History  . retired  Retired   Social History Main Topics  . Smoking status: Former Smoker    Packs/day: 1.00    Years: 36.00    Types: Cigarettes    Quit date: 02/18/1984  . Smokeless tobacco: Never Used  . Alcohol use No  . Drug use: No  . Sexual activity: Not Currently   Other Topics Concern  . Not on file   Social History Narrative   Exercise-- yard, house work    ECG:  SR rate 67  PR 236  2016  01/02/16  SR rate 69 PR 268    ROS A 10 point review of system was performed. It is negative other  than that mentioned in the history of present illness.   PHYSICAL EXAM   BP (!) 160/62 (BP Location: Right Arm, Patient Position: Sitting, Cuff Size: Normal)   Pulse 72   Ht 5' (1.524 m)   Wt 126 lb (57.2 kg)   SpO2 99%   BMI 24.61 kg/m  Constitutional: She is oriented to person, place, and time. She appears well-developed and well-nourished. No distress.  HENT: No nasal discharge.  Head: Normocephalic and atraumatic.  Eyes: Pupils are equal and round. No discharge.  Neck: Normal range of motion. Neck supple. No JVD present. No thyromegaly present.  Cardiovascular: Normal rate, regular rhythm, normal heart sounds. Exam reveals no gallop and no friction rub. No murmur heard.  Pulmonary/Chest: Effort normal and breath sounds normal. No stridor. No respiratory distress. She has no wheezes. She has no rales. She exhibits no tenderness.  Abdominal: Soft. Bowel sounds are normal. She exhibits no distension. There is no tenderness. There is no rebound and no guarding.  Musculoskeletal: Normal range of motion. She exhibits no edema and no tenderness.  Neurological: She is alert and oriented to person, place, and time. Coordination normal.  Skin: Skin is warm and dry. No rash noted. She is not diaphoretic. No erythema. No pallor.  Psychiatric: She has a normal mood and affect. Her behavior is normal. Judgment and thought content normal.  Vascular: Femoral pulse is not palpable on the right side and normal on  the left side. Distal pulses are only palpable on the left side.     ASSESSMENT AND PLAN CAD Stable with no angina and good activity level.  Continue medical Rx CABG 2015 MV Repair no residual murmur excellent result  SBE PVD stable RLE f/u Arida  No indication for angio at this time  ABI/duplex ordered   Chol  At goal  Lab Results  Component Value Date   LDLCALC 131 (H) 05/16/2015    HTN elevated compliant f/u next available if home readings high increase zestril to 40 mg    Dr Kirke Corin for PVD now  Last seen by him 09/20/14

## 2016-01-11 ENCOUNTER — Ambulatory Visit (INDEPENDENT_AMBULATORY_CARE_PROVIDER_SITE_OTHER): Payer: Medicare Other | Admitting: Family Medicine

## 2016-01-11 VITALS — BP 142/72 | HR 63 | Temp 98.2°F | Ht 60.25 in | Wt 122.6 lb

## 2016-01-11 DIAGNOSIS — R82998 Other abnormal findings in urine: Secondary | ICD-10-CM

## 2016-01-11 DIAGNOSIS — N390 Urinary tract infection, site not specified: Secondary | ICD-10-CM | POA: Diagnosis not present

## 2016-01-11 DIAGNOSIS — Z23 Encounter for immunization: Secondary | ICD-10-CM

## 2016-01-11 DIAGNOSIS — I10 Essential (primary) hypertension: Secondary | ICD-10-CM | POA: Diagnosis not present

## 2016-01-11 DIAGNOSIS — IMO0002 Reserved for concepts with insufficient information to code with codable children: Secondary | ICD-10-CM

## 2016-01-11 DIAGNOSIS — E1151 Type 2 diabetes mellitus with diabetic peripheral angiopathy without gangrene: Secondary | ICD-10-CM

## 2016-01-11 DIAGNOSIS — E1165 Type 2 diabetes mellitus with hyperglycemia: Secondary | ICD-10-CM | POA: Diagnosis not present

## 2016-01-11 DIAGNOSIS — Z0000 Encounter for general adult medical examination without abnormal findings: Secondary | ICD-10-CM | POA: Diagnosis not present

## 2016-01-11 DIAGNOSIS — E785 Hyperlipidemia, unspecified: Secondary | ICD-10-CM

## 2016-01-11 LAB — POCT URINALYSIS DIPSTICK
BILIRUBIN UA: NEGATIVE
Glucose, UA: NEGATIVE
Ketones, UA: NEGATIVE
NITRITE UA: NEGATIVE
PH UA: 6
Protein, UA: NEGATIVE
RBC UA: NEGATIVE
SPEC GRAV UA: 1.025
Urobilinogen, UA: 0.2

## 2016-01-11 LAB — COMPREHENSIVE METABOLIC PANEL
ALT: 10 U/L (ref 0–35)
AST: 12 U/L (ref 0–37)
Albumin: 4.3 g/dL (ref 3.5–5.2)
Alkaline Phosphatase: 37 U/L — ABNORMAL LOW (ref 39–117)
BUN: 25 mg/dL — ABNORMAL HIGH (ref 6–23)
CALCIUM: 9.9 mg/dL (ref 8.4–10.5)
CHLORIDE: 105 meq/L (ref 96–112)
CO2: 30 meq/L (ref 19–32)
CREATININE: 1.37 mg/dL — AB (ref 0.40–1.20)
GFR: 39.04 mL/min — AB (ref >60.00)
Glucose, Bld: 109 mg/dL — ABNORMAL HIGH (ref 70–99)
Potassium: 4.2 mEq/L (ref 3.5–5.1)
SODIUM: 142 meq/L (ref 135–145)
Total Bilirubin: 0.6 mg/dL (ref 0.2–1.2)
Total Protein: 7 g/dL (ref 6.0–8.3)

## 2016-01-11 LAB — CBC WITH DIFFERENTIAL/PLATELET
BASOS PCT: 0.8 % (ref 0.0–3.0)
Basophils Absolute: 0 10*3/uL (ref 0.0–0.1)
EOS PCT: 5.4 % — AB (ref 0.0–5.0)
Eosinophils Absolute: 0.3 10*3/uL (ref 0.0–0.7)
HEMATOCRIT: 31.3 % — AB (ref 36.0–46.0)
HEMOGLOBIN: 10.5 g/dL — AB (ref 12.0–15.0)
LYMPHS PCT: 30.8 % (ref 12.0–46.0)
Lymphs Abs: 1.8 10*3/uL (ref 0.7–4.0)
MCHC: 33.6 g/dL (ref 30.0–36.0)
MCV: 101.1 fl — ABNORMAL HIGH (ref 78.0–100.0)
Monocytes Absolute: 0.8 10*3/uL (ref 0.1–1.0)
Monocytes Relative: 13.9 % — ABNORMAL HIGH (ref 3.0–12.0)
NEUTROS ABS: 2.9 10*3/uL (ref 1.4–7.7)
Neutrophils Relative %: 49.1 % (ref 43.0–77.0)
PLATELETS: 208 10*3/uL (ref 150.0–400.0)
RBC: 3.1 Mil/uL — ABNORMAL LOW (ref 3.87–5.11)
RDW: 14.6 % (ref 11.5–15.5)
WBC: 5.9 10*3/uL (ref 4.0–10.5)

## 2016-01-11 LAB — HEMOGLOBIN A1C: Hgb A1c MFr Bld: 5.1 % (ref 4.6–6.5)

## 2016-01-11 LAB — LIPID PANEL
CHOL/HDL RATIO: 5
CHOLESTEROL: 214 mg/dL — AB (ref 0–200)
HDL: 44.4 mg/dL (ref >39.00)
LDL CALC: 137 mg/dL — AB (ref 0–99)
NonHDL: 169.59
TRIGLYCERIDES: 165 mg/dL — AB (ref 0.0–149.0)
VLDL: 33 mg/dL (ref 0.0–40.0)

## 2016-01-11 MED ORDER — SITAGLIPTIN PHOSPHATE 100 MG PO TABS
100.0000 mg | ORAL_TABLET | Freq: Every day | ORAL | 5 refills | Status: DC
Start: 2016-01-11 — End: 2016-07-14

## 2016-01-11 NOTE — Progress Notes (Signed)
Subjective:   Emily Daniel is a 82 y.o. female who presents for Medicare Annual (Subsequent) preventive examination.  Review of Systems:   Review of Systems  Constitutional: Negative for activity change, appetite change and fatigue.  HENT: Negative for hearing loss, congestion, tinnitus and ear discharge.   Eyes: Negative for visual disturbance (see optho q1y -- vision corrected to 20/20 with glasses).  Respiratory: Negative for cough, chest tightness and shortness of breath.   Cardiovascular: Negative for chest pain, palpitations and leg swelling.  Gastrointestinal: Negative for abdominal pain, diarrhea, constipation and abdominal distention.  Genitourinary: Negative for urgency, frequency, decreased urine volume and difficulty urinating.  Musculoskeletal: Negative for back pain, arthralgias and gait problem.  Skin: Negative for color change, pallor and rash.  Neurological: Negative for dizziness, light-headedness, numbness and headaches.  Hematological: Negative for adenopathy. Does not bruise/bleed easily.  Psychiatric/Behavioral: Negative for suicidal ideas, confusion, sleep disturbance, self-injury, dysphoric mood, decreased concentration and agitation.  Pt is able to read and write and can do all ADLs No risk for falling No abuse/ violence in home          Objective:     Vitals: BP (!) 142/72   Pulse 63   Temp 98.2 F (36.8 C) (Oral)   Ht 5' 0.25" (1.53 m)   Wt 122 lb 9.6 oz (55.6 kg)   SpO2 100%   BMI 23.75 kg/m   Body mass index is 23.75 kg/m.  BP (!) 142/72   Pulse 63   Temp 98.2 F (36.8 C) (Oral)   Ht 5' 0.25" (1.53 m)   Wt 122 lb 9.6 oz (55.6 kg)   SpO2 100%   BMI 23.75 kg/m  General appearance: alert, cooperative, appears stated age and no distress Head: Normocephalic, without obvious abnormality, atraumatic Eyes: conjunctivae/corneas clear. PERRL, EOM's intact. Fundi benign. Ears: normal TM's and external ear canals both ears Nose: Nares  normal. Septum midline. Mucosa normal. No drainage or sinus tenderness. Throat: lips, mucosa, and tongue normal; teeth and gums normal Neck: no adenopathy, no carotid bruit, no JVD, supple, symmetrical, trachea midline and thyroid not enlarged, symmetric, no tenderness/mass/nodules Back: symmetric, no curvature. ROM normal. No CVA tenderness. Lungs: clear to auscultation bilaterally Heart: regular rate and rhythm, S1, S2 normal, no murmur, click, rub or gallop Abdomen: soft, non-tender; bowel sounds normal; no masses,  no organomegaly Pelvic: not indicated; status post hysterectomy, negative ROS Extremities: extremities normal, atraumatic, no cyanosis or edema Pulses: 2+ and symmetric Skin: Skin color, texture, turgor normal. No rashes or lesions Lymph nodes: Cervical, supraclavicular, and axillary nodes normal. Neurologic: Alert and oriented X 3, normal strength and tone. Normal symmetric reflexes. Normal coordination and gait  .Tobacco History  Smoking Status  . Former Smoker  . Packs/day: 1.00  . Years: 36.00  . Types: Cigarettes  . Quit date: 02/18/1984  Smokeless Tobacco  . Never Used     Counseling given: Not Answered   Past Medical History:  Diagnosis Date  . Anxiety   . Atrial fibrillation (HCC)    post op >>> req'd Amio and Coumadin  . AVM (arteriovenous malformation)   . CAD (coronary artery disease)    a. NSTEMI 10/2013 >>> LHC (10/2013):  Distal left main 90%, proximal LAD 30%, mid LAD 20%, ostial circumflex 99%, then 80%, mid RCA 99%, EF 30-35%, 3-4+ MR; proximal right iliac occluded, right renal 50%, ostial left iliac 70% >>> CABG/MV repair  . Cataract   . CHF (congestive heart failure) (HCC)  Dr. Dorris FetchHendrickson following  . Chronic systolic heart failure (HCC)   . Diabetes mellitus, type 2 (HCC)   . Hemorrhoids   . Hx of echocardiogram    Echo (9/15):  Mild LVH, EF 40-45%, inf AK, Gr 1 DD, trivial AI, MV repair ok (mild MS, mild MR), mild LAE, PASP 32 mmHg.  [name].  Hyperlipidemia   . Hypertension   . Ischemic cardiomyopathy    a. Echo (11/08/13):  EF 30-35%, diffuse hypokinesis, severe MR, mild LAE, PASP 48 mm Hg  . Mitral regurgitation    a. s/p MV repair at time of CABG  . Osteoporosis   . PVD (peripheral vascular disease) (HCC)   . Vitamin B12 deficiency    Past Surgical History:  Procedure Laterality Date  . ABDOMINAL HYSTERECTOMY     1969  . APPENDECTOMY     1945  . CORONARY ARTERY BYPASS GRAFT  1990, 1995, 1998  . HEMORRHOID SURGERY     1959  . LEFT AND RIGHT HEART CATHETERIZATION WITH CORONARY ANGIOGRAM N/A 11/10/2013   Procedure: LEFT AND RIGHT HEART CATHETERIZATION WITH CORONARY ANGIOGRAM;  Surgeon: Lennette Biharihomas A Kelly, MD;  [facility];  Service: Cardiovascular;  Laterality: N/A;  . MITRAL VALVE REPLACEMENT (MVR)/CORONARY ARTERY BYPASS GRAFTING (CABG) N/A 11/11/2013   Procedure: MITRAL VALVE Ring repair (MVR)/CORONARY ARTERY BYPASS GRAFTING (CABG) times three using left internal mammary and left sphenous vein.;  Surgeon: Loreli SlotSteven C Hendrickson, MD;  Location: [facility] OR;  Service: Open Heart Surgery;  Laterality: N/A;  Coronary artery bypass graft times three using left internal mammary artery and left saphenous leg vein using endoscope.  Exploration of right leg.    Family History  Problem Relation Age of Onset  . Cervical cancer Mother   . Cancer Brother     renal ...x3??  [name]. Hypertension Other   . Stroke Other   . Hypertension Sister   . Heart attack Neg Hx    History  Sexual Activity  . Sexual activity: Not Currently    Outpatient Encounter Prescriptions as of 01/11/2016  Medication Sig  . aspirin 325 MG tablet Take 325 mg by mouth daily.  . Calcium Carbonate-Vitamin D (CALCIUM-VITAMIN D) 600-200 MG-UNIT CAPS Take 1 capsule by mouth 2 (two) times daily.   [name]. Casanthranol-Docusate Sodium 30-100 MG CAPS Take 1 capsule by mouth daily as needed (for constipation).   . ferrous sulfate (IRON SUPPLEMENT) 325 (65 FE) MG tablet Take 325  mg by mouth daily with breakfast.  . furosemide (LASIX) 20 MG tablet Take 1 tablet (20 mg total) by mouth daily.  [name]. lisinopril (PRINIVIL,ZESTRIL) 10 MG tablet Take 1 tablet (10 mg total) by mouth daily.  . metoprolol (LOPRESSOR) 50 MG tablet Take 1 tablet (50 mg total) by mouth 2 (two) times daily.  . Multiple Vitamin (MULTIVITAMIN) tablet Take 1 tablet by mouth daily.    . Multiple Vitamins-Minerals (PRESERVISION AREDS) TABS Take 2 tablets by mouth daily.  . sitaGLIPtin (JANUVIA) 100 MG tablet Take 1 tablet (100 mg total) by mouth daily.  . vitamin B-12 (CYANOCOBALAMIN) 1000 MCG tablet Take 1,000 mcg by mouth daily.    . [DISCONTINUED] furosemide (LASIX) 20 MG tablet Take 1 tablet (20 mg total) by mouth daily.  . [DISCONTINUED] JANUVIA 100 MG tablet TAKE ONE TABLET BY MOUTH ONCE DAILY  . [DISCONTINUED] lisinopril (PRINIVIL,ZESTRIL) 10 MG tablet Take 1 tablet (10 mg total) by mouth daily.  . [DISCONTINUED] metoprolol tartrate (LOPRESSOR) 50 MG tablet Take 1 tablet (50 mg total) by  mouth 2 (two) times daily.   No facility-administered encounter medications on file as of 01/11/2016.     Activities of Daily Living In your present state of health, do you have any difficulty performing the following activities: 01/11/2016 05/16/2015  Hearing? N Y  Vision? N N  Difficulty concentrating or making decisions? N N  Walking or climbing stairs? N Y  Dressing or bathing? N N  Doing errands, shopping? N N  Some recent data might be hidden    Patient Care Team: Donato Schultz, DO as PCP - General    Assessment:    CPE Exercise Activities and Dietary recommendations Current Exercise Habits: The patient does not participate in regular exercise at present, Exercise limited by: cardiac condition(s)  Goals    None     Fall Risk Fall Risk  01/11/2016 05/16/2015 04/19/2014 04/01/2013  Falls in the past year? No No No No   Depression Screen PHQ 2/9 Scores 01/11/2016 05/16/2015 04/19/2014  04/01/2013  PHQ - 2 Score 0 0 0 0     Cognitive Testing MMSE - Mini Mental State Exam 01/11/2016  Orientation to time 5  Orientation to Place 5  Registration 3  Attention/ Calculation 5  Recall 3  Language- name 2 objects 2  Language- repeat 1  Language- follow 3 step command 3  Language- read & follow direction 1  Write a sentence 1  Copy design 1  Total score 30    Immunization History  Administered Date(s) Administered  . Influenza Split 03/20/2011, 03/31/2012  . Influenza Whole 03/04/2008, 03/28/2009, 03/16/2010  . Influenza, High Dose Seasonal PF 04/19/2014, 05/16/2015  . Pneumococcal Conjugate-13 12/21/2013  . Pneumococcal Polysaccharide-23 03/10/1997, 01/11/2016  . Tdap 04/16/2011  . Zoster 05/14/2007   Screening Tests Health Maintenance  Topic Date Due  . INFLUENZA VACCINE  12/26/2015  . FOOT EXAM  05/15/2016  . MAMMOGRAM  05/29/2016  . HEMOGLOBIN A1C  07/13/2016  . OPHTHALMOLOGY EXAM  12/06/2016  . TETANUS/TDAP  04/15/2021  . DEXA SCAN  Completed  . ZOSTAVAX  Completed  . PNA vac Low Risk Adult  Completed      Plan:    see AVS During the course of the visit the patient was educated and counseled about the following appropriate screening and preventive services:   Vaccines to include Pneumoccal, Influenza, Hepatitis B, Td, Zostavax, HCV  Electrocardiogram  Cardiovascular Disease  Colorectal cancer screening  Bone density screening  Diabetes screening  Glaucoma screening  Mammography/PAP  Nutrition counseling   Patient Instructions (the written plan) was given to the patient.  1. DM (diabetes mellitus) type II uncontrolled, periph vascular disorder (HCC) Check labs - Comprehensive metabolic panel - Hemoglobin A1c - CBC with Differential/Platelet - POCT urinalysis dipstick - sitaGLIPtin (JANUVIA) 100 MG tablet; Take 1 tablet (100 mg total) by mouth daily.  Dispense: 30 tablet; Refill: 5  2. Hyperlipidemia LDL goal <70 Check labs On no  meds - Comprehensive metabolic panel - Lipid panel - CBC with Differential/Platelet - POCT urinalysis dipstick  3. Essential hypertension Stable con't meds - Comprehensive metabolic panel - CBC with Differential/Platelet - POCT urinalysis dipstick - metoprolol (LOPRESSOR) 50 MG tablet; Take 1 tablet (50 mg total) by mouth 2 (two) times daily.  Dispense: 180 tablet; Refill: 3 - lisinopril (PRINIVIL,ZESTRIL) 10 MG tablet; Take 1 tablet (10 mg total) by mouth daily.  Dispense: 90 tablet; Refill: 3 - furosemide (LASIX) 20 MG tablet; Take 1 tablet (20 mg total) by mouth daily.  Dispense:  90 tablet; Refill: 3  4. Preventative health care   5. Routine history and physical examination of adult   6. Leukocytes in urine  - Urine culture  Donato SchultzYvonne R Lowne Chase, DO  01/12/2016

## 2016-01-11 NOTE — Patient Instructions (Signed)
Preventive Care for Adults, Female A healthy lifestyle and preventive care can promote health and wellness. Preventive health guidelines for women include the following key practices.  A routine yearly physical is a good way to check with your health care provider about your health and preventive screening. It is a chance to share any concerns and updates on your health and to receive a thorough exam.  Visit your dentist for a routine exam and preventive care every 6 months. Brush your teeth twice a day and floss once a day. Good oral hygiene prevents tooth decay and gum disease.  The frequency of eye exams is based on your age, health, family medical history, use of contact lenses, and other factors. Follow your health care provider's recommendations for frequency of eye exams.  Eat a healthy diet. Foods like vegetables, fruits, whole grains, low-fat dairy products, and lean protein foods contain the nutrients you need without too many calories. Decrease your intake of foods high in solid fats, added sugars, and salt. Eat the right amount of calories for you.Get information about a proper diet from your health care provider, if necessary.  Regular physical exercise is one of the most important things you can do for your health. Most adults should get at least 150 minutes of moderate-intensity exercise (any activity that increases your heart rate and causes you to sweat) each week. In addition, most adults need muscle-strengthening exercises on 2 or more days a week.  Maintain a healthy weight. The body mass index (BMI) is a screening tool to identify possible weight problems. It provides an estimate of body fat based on height and weight. Your health care provider can find your BMI and can help you achieve or maintain a healthy weight.For adults 20 years and older:  A BMI below 18.5 is considered underweight.  A BMI of 18.5 to 24.9 is normal.  A BMI of 25 to 29.9 is considered overweight.  A  BMI of 30 and above is considered obese.  Maintain normal blood lipids and cholesterol levels by exercising and minimizing your intake of saturated fat. Eat a balanced diet with plenty of fruit and vegetables. Blood tests for lipids and cholesterol should begin at age 45 and be repeated every 5 years. If your lipid or cholesterol levels are high, you are over 50, or you are at high risk for heart disease, you may need your cholesterol levels checked more frequently.Ongoing high lipid and cholesterol levels should be treated with medicines if diet and exercise are not working.  If you smoke, find out from your health care provider how to quit. If you do not use tobacco, do not start.  Lung cancer screening is recommended for adults aged 45-80 years who are at high risk for developing lung cancer because of a history of smoking. A yearly low-dose CT scan of the lungs is recommended for people who have at least a 30-pack-year history of smoking and are a current smoker or have quit within the past 15 years. A pack year of smoking is smoking an average of 1 pack of cigarettes a day for 1 year (for example: 1 pack a day for 30 years or 2 packs a day for 15 years). Yearly screening should continue until the smoker has stopped smoking for at least 15 years. Yearly screening should be stopped for people who develop a health problem that would prevent them from having lung cancer treatment.  If you are pregnant, do not drink alcohol. If you are  breastfeeding, be very cautious about drinking alcohol. If you are not pregnant and choose to drink alcohol, do not have more than 1 drink per day. One drink is considered to be 12 ounces (355 mL) of beer, 5 ounces (148 mL) of wine, or 1.5 ounces (44 mL) of liquor.  Avoid use of street drugs. Do not share needles with anyone. Ask for help if you need support or instructions about stopping the use of drugs.  High blood pressure causes heart disease and increases the risk  of stroke. Your blood pressure should be checked at least every 1 to 2 years. Ongoing high blood pressure should be treated with medicines if weight loss and exercise do not work.  If you are 55-79 years old, ask your health care provider if you should take aspirin to prevent strokes.  Diabetes screening is done by taking a blood sample to check your blood glucose level after you have not eaten for a certain period of time (fasting). If you are not overweight and you do not have risk factors for diabetes, you should be screened once every 3 years starting at age 45. If you are overweight or obese and you are 40-70 years of age, you should be screened for diabetes every year as part of your cardiovascular risk assessment.  Breast cancer screening is essential preventive care for women. You should practice "breast self-awareness." This means understanding the normal appearance and feel of your breasts and may include breast self-examination. Any changes detected, no matter how small, should be reported to a health care provider. Women in their 20s and 30s should have a clinical breast exam (CBE) by a health care provider as part of a regular health exam every 1 to 3 years. After age 40, women should have a CBE every year. Starting at age 40, women should consider having a mammogram (breast X-ray test) every year. Women who have a family history of breast cancer should talk to their health care provider about genetic screening. Women at a high risk of breast cancer should talk to their health care providers about having an MRI and a mammogram every year.  Breast cancer gene (BRCA)-related cancer risk assessment is recommended for women who have family members with BRCA-related cancers. BRCA-related cancers include breast, ovarian, tubal, and peritoneal cancers. Having family members with these cancers may be associated with an increased risk for harmful changes (mutations) in the breast cancer genes BRCA1 and  BRCA2. Results of the assessment will determine the need for genetic counseling and BRCA1 and BRCA2 testing.  Your health care provider may recommend that you be screened regularly for cancer of the pelvic organs (ovaries, uterus, and vagina). This screening involves a pelvic examination, including checking for microscopic changes to the surface of your cervix (Pap test). You may be encouraged to have this screening done every 3 years, beginning at age 21.  For women ages 30-65, health care providers may recommend pelvic exams and Pap testing every 3 years, or they may recommend the Pap and pelvic exam, combined with testing for human papilloma virus (HPV), every 5 years. Some types of HPV increase your risk of cervical cancer. Testing for HPV may also be done on women of any age with unclear Pap test results.  Other health care providers may not recommend any screening for nonpregnant women who are considered low risk for pelvic cancer and who do not have symptoms. Ask your health care provider if a screening pelvic exam is right for   you.  If you have had past treatment for cervical cancer or a condition that could lead to cancer, you need Pap tests and screening for cancer for at least 20 years after your treatment. If Pap tests have been discontinued, your risk factors (such as having a new sexual partner) need to be reassessed to determine if screening should resume. Some women have medical problems that increase the chance of getting cervical cancer. In these cases, your health care provider may recommend more frequent screening and Pap tests.  Colorectal cancer can be detected and often prevented. Most routine colorectal cancer screening begins at the age of 50 years and continues through age 75 years. However, your health care provider may recommend screening at an earlier age if you have risk factors for colon cancer. On a yearly basis, your health care provider may provide home test kits to check  for hidden blood in the stool. Use of a small camera at the end of a tube, to directly examine the colon (sigmoidoscopy or colonoscopy), can detect the earliest forms of colorectal cancer. Talk to your health care provider about this at age 50, when routine screening begins. Direct exam of the colon should be repeated every 5-10 years through age 75 years, unless early forms of precancerous polyps or small growths are found.  People who are at an increased risk for hepatitis B should be screened for this virus. You are considered at high risk for hepatitis B if:  You were born in a country where hepatitis B occurs often. Talk with your health care provider about which countries are considered high risk.  Your parents were born in a high-risk country and you have not received a shot to protect against hepatitis B (hepatitis B vaccine).  You have HIV or AIDS.  You use needles to inject street drugs.  You live with, or have sex with, someone who has hepatitis B.  You get hemodialysis treatment.  You take certain medicines for conditions like cancer, organ transplantation, and autoimmune conditions.  Hepatitis C blood testing is recommended for all people born from 1945 through 1965 and any individual with known risks for hepatitis C.  Practice safe sex. Use condoms and avoid high-risk sexual practices to reduce the spread of sexually transmitted infections (STIs). STIs include gonorrhea, chlamydia, syphilis, trichomonas, herpes, HPV, and human immunodeficiency virus (HIV). Herpes, HIV, and HPV are viral illnesses that have no cure. They can result in disability, cancer, and death.  You should be screened for sexually transmitted illnesses (STIs) including gonorrhea and chlamydia if:  You are sexually active and are younger than 24 years.  You are older than 24 years and your health care provider tells you that you are at risk for this type of infection.  Your sexual activity has changed  since you were last screened and you are at an increased risk for chlamydia or gonorrhea. Ask your health care provider if you are at risk.  If you are at risk of being infected with HIV, it is recommended that you take a prescription medicine daily to prevent HIV infection. This is called preexposure prophylaxis (PrEP). You are considered at risk if:  You are sexually active and do not regularly use condoms or know the HIV status of your partner(s).  You take drugs by injection.  You are sexually active with a partner who has HIV.  Talk with your health care provider about whether you are at high risk of being infected with HIV. If   you choose to begin PrEP, you should first be tested for HIV. You should then be tested every 3 months for as long as you are taking PrEP.  Osteoporosis is a disease in which the bones lose minerals and strength with aging. This can result in serious bone fractures or breaks. The risk of osteoporosis can be identified using a bone density scan. Women ages 67 years and over and women at risk for fractures or osteoporosis should discuss screening with their health care providers. Ask your health care provider whether you should take a calcium supplement or vitamin D to reduce the rate of osteoporosis.  Menopause can be associated with physical symptoms and risks. Hormone replacement therapy is available to decrease symptoms and risks. You should talk to your health care provider about whether hormone replacement therapy is right for you.  Use sunscreen. Apply sunscreen liberally and repeatedly throughout the day. You should seek shade when your shadow is shorter than you. Protect yourself by wearing long sleeves, pants, a wide-brimmed hat, and sunglasses year round, whenever you are outdoors.  Once a month, do a whole body skin exam, using a mirror to look at the skin on your back. Tell your health care provider of new moles, moles that have irregular borders, moles that  are larger than a pencil eraser, or moles that have changed in shape or color.  Stay current with required vaccines (immunizations).  Influenza vaccine. All adults should be immunized every year.  Tetanus, diphtheria, and acellular pertussis (Td, Tdap) vaccine. Pregnant women should receive 1 dose of Tdap vaccine during each pregnancy. The dose should be obtained regardless of the length of time since the last dose. Immunization is preferred during the 27th-36th week of gestation. An adult who has not previously received Tdap or who does not know her vaccine status should receive 1 dose of Tdap. This initial dose should be followed by tetanus and diphtheria toxoids (Td) booster doses every 10 years. Adults with an unknown or incomplete history of completing a 3-dose immunization series with Td-containing vaccines should begin or complete a primary immunization series including a Tdap dose. Adults should receive a Td booster every 10 years.  Varicella vaccine. An adult without evidence of immunity to varicella should receive 2 doses or a second dose if she has previously received 1 dose. Pregnant females who do not have evidence of immunity should receive the first dose after pregnancy. This first dose should be obtained before leaving the health care facility. The second dose should be obtained 4-8 weeks after the first dose.  Human papillomavirus (HPV) vaccine. Females aged 13-26 years who have not received the vaccine previously should obtain the 3-dose series. The vaccine is not recommended for use in pregnant females. However, pregnancy testing is not needed before receiving a dose. If a female is found to be pregnant after receiving a dose, no treatment is needed. In that case, the remaining doses should be delayed until after the pregnancy. Immunization is recommended for any person with an immunocompromised condition through the age of 61 years if she did not get any or all doses earlier. During the  3-dose series, the second dose should be obtained 4-8 weeks after the first dose. The third dose should be obtained 24 weeks after the first dose and 16 weeks after the second dose.  Zoster vaccine. One dose is recommended for adults aged 30 years or older unless certain conditions are present.  Measles, mumps, and rubella (MMR) vaccine. Adults born  before 1957 generally are considered immune to measles and mumps. Adults born in 1957 or later should have 1 or more doses of MMR vaccine unless there is a contraindication to the vaccine or there is laboratory evidence of immunity to each of the three diseases. A routine second dose of MMR vaccine should be obtained at least 28 days after the first dose for students attending postsecondary schools, health care workers, or international travelers. People who received inactivated measles vaccine or an unknown type of measles vaccine during 1963-1967 should receive 2 doses of MMR vaccine. People who received inactivated mumps vaccine or an unknown type of mumps vaccine before 1979 and are at high risk for mumps infection should consider immunization with 2 doses of MMR vaccine. For females of childbearing age, rubella immunity should be determined. If there is no evidence of immunity, females who are not pregnant should be vaccinated. If there is no evidence of immunity, females who are pregnant should delay immunization until after pregnancy. Unvaccinated health care workers born before 1957 who lack laboratory evidence of measles, mumps, or rubella immunity or laboratory confirmation of disease should consider measles and mumps immunization with 2 doses of MMR vaccine or rubella immunization with 1 dose of MMR vaccine.  Pneumococcal 13-valent conjugate (PCV13) vaccine. When indicated, a person who is uncertain of his immunization history and has no record of immunization should receive the PCV13 vaccine. All adults 65 years of age and older should receive this  vaccine. An adult aged 19 years or older who has certain medical conditions and has not been previously immunized should receive 1 dose of PCV13 vaccine. This PCV13 should be followed with a dose of pneumococcal polysaccharide (PPSV23) vaccine. Adults who are at high risk for pneumococcal disease should obtain the PPSV23 vaccine at least 8 weeks after the dose of PCV13 vaccine. Adults older than 80 years of age who have normal immune system function should obtain the PPSV23 vaccine dose at least 1 year after the dose of PCV13 vaccine.  Pneumococcal polysaccharide (PPSV23) vaccine. When PCV13 is also indicated, PCV13 should be obtained first. All adults aged 65 years and older should be immunized. An adult younger than age 65 years who has certain medical conditions should be immunized. Any person who resides in a nursing home or long-term care facility should be immunized. An adult smoker should be immunized. People with an immunocompromised condition and certain other conditions should receive both PCV13 and PPSV23 vaccines. People with human immunodeficiency virus (HIV) infection should be immunized as soon as possible after diagnosis. Immunization during chemotherapy or radiation therapy should be avoided. Routine use of PPSV23 vaccine is not recommended for American Indians, [place] Natives, or people younger than 65 years unless there are medical conditions that require PPSV23 vaccine. When indicated, people who have unknown immunization and have no record of immunization should receive PPSV23 vaccine. One-time revaccination 5 years after the first dose of PPSV23 is recommended for people aged 19-64 years who have chronic kidney failure, nephrotic syndrome, asplenia, or immunocompromised conditions. People who received 1-2 doses of PPSV23 before age 65 years should receive another dose of PPSV23 vaccine at age 65 years or later if at least 5 years have passed since the previous dose. Doses of PPSV23 are not  needed for people immunized with PPSV23 at or after age 65 years.  Meningococcal vaccine. Adults with asplenia or persistent complement component deficiencies should receive 2 doses of quadrivalent meningococcal conjugate (MenACWY-D) vaccine. The doses should be obtained   at least 2 months apart. Microbiologists working with certain meningococcal bacteria, [place] recruits, people at risk during an outbreak, and people who travel to or live in countries with a high rate of meningitis should be immunized. A first-year college student up through age 34 years who is living in a residence hall should receive a dose if she did not receive a dose on or after her 16th birthday. Adults who have certain high-risk conditions should receive one or more doses of vaccine.  Hepatitis A vaccine. Adults who wish to be protected from this disease, have certain high-risk conditions, work with hepatitis A-infected animals, work in hepatitis A research labs, or travel to or work in countries with a high rate of hepatitis A should be immunized. Adults who were previously unvaccinated and who anticipate close contact with an international adoptee during the first 60 days after arrival in the [place] from a country with a high rate of hepatitis A should be immunized.  Hepatitis B vaccine. Adults who wish to be protected from this disease, have certain high-risk conditions, may be exposed to blood or other infectious body fluids, are household contacts or sex partners of hepatitis B positive people, are clients or workers in certain care facilities, or travel to or work in countries with a high rate of hepatitis B should be immunized.  Haemophilus influenzae type b (Hib) vaccine. A previously unvaccinated person with asplenia or sickle cell disease or having a scheduled splenectomy should receive 1 dose of Hib vaccine. Regardless of previous immunization, a recipient of a hematopoietic stem cell transplant should receive a  3-dose series 6-12 months after her successful transplant. Hib vaccine is not recommended for adults with HIV infection. Preventive Services / Frequency Ages 35 to 4 years  Blood pressure check.** / Every 3-5 years.  Lipid and cholesterol check.** / Every 5 years beginning at age 60.  Clinical breast exam.** / Every 3 years for women in their 71s and 10s.  BRCA-related cancer risk assessment.** / For women who have family members with a BRCA-related cancer (breast, ovarian, tubal, or peritoneal cancers).  Pap test.** / Every 2 years from ages 76 through 26. Every 3 years starting at age 61 through age 76 or 93 with a history of 3 consecutive normal Pap tests.  HPV screening.** / Every 3 years from ages 37 through ages 60 to 51 with a history of 3 consecutive normal Pap tests.  Hepatitis C blood test.** / For any individual with known risks for hepatitis C.  Skin self-exam. / Monthly.  Influenza vaccine. / Every year.  Tetanus, diphtheria, and acellular pertussis (Tdap, Td) vaccine.** / Consult your health care provider. Pregnant women should receive 1 dose of Tdap vaccine during each pregnancy. 1 dose of Td every 10 years.  Varicella vaccine.** / Consult your health care provider. Pregnant females who do not have evidence of immunity should receive the first dose after pregnancy.  HPV vaccine. / 3 doses over 6 months, if 93 and younger. The vaccine is not recommended for use in pregnant females. However, pregnancy testing is not needed before receiving a dose.  Measles, mumps, rubella (MMR) vaccine.** / You need at least 1 dose of MMR if you were born in 1957 or later. You may also need a 2nd dose. For females of childbearing age, rubella immunity should be determined. If there is no evidence of immunity, females who are not pregnant should be vaccinated. If there is no evidence of immunity, females who are  pregnant should delay immunization until after pregnancy.  Pneumococcal  13-valent conjugate (PCV13) vaccine.** / Consult your health care provider.  Pneumococcal polysaccharide (PPSV23) vaccine.** / 1 to 2 doses if you smoke cigarettes or if you have certain conditions.  Meningococcal vaccine.** / 1 dose if you are age 68 to 8 years and a Market researcher living in a residence hall, or have one of several medical conditions, you need to get vaccinated against meningococcal disease. You may also need additional booster doses.  Hepatitis A vaccine.** / Consult your health care provider.  Hepatitis B vaccine.** / Consult your health care provider.  Haemophilus influenzae type b (Hib) vaccine.** / Consult your health care provider. Ages 7 to 53 years  Blood pressure check.** / Every year.  Lipid and cholesterol check.** / Every 5 years beginning at age 25 years.  Lung cancer screening. / Every year if you are aged 11-80 years and have a 30-pack-year history of smoking and currently smoke or have quit within the past 15 years. Yearly screening is stopped once you have quit smoking for at least 15 years or develop a health problem that would prevent you from having lung cancer treatment.  Clinical breast exam.** / Every year after age 48 years.  BRCA-related cancer risk assessment.** / For women who have family members with a BRCA-related cancer (breast, ovarian, tubal, or peritoneal cancers).  Mammogram.** / Every year beginning at age 41 years and continuing for as long as you are in good health. Consult with your health care provider.  Pap test.** / Every 3 years starting at age 65 years through age 37 or 70 years with a history of 3 consecutive normal Pap tests.  HPV screening.** / Every 3 years from ages 72 years through ages 60 to 40 years with a history of 3 consecutive normal Pap tests.  Fecal occult blood test (FOBT) of stool. / Every year beginning at age 21 years and continuing until age 5 years. You may not need to do this test if you get  a colonoscopy every 10 years.  Flexible sigmoidoscopy or colonoscopy.** / Every 5 years for a flexible sigmoidoscopy or every 10 years for a colonoscopy beginning at age 35 years and continuing until age 48 years.  Hepatitis C blood test.** / For all people born from [birth year] through 1965 and any individual with known risks for hepatitis C.  Skin self-exam. / Monthly.  Influenza vaccine. / Every year.  Tetanus, diphtheria, and acellular pertussis (Tdap/Td) vaccine.** / Consult your health care provider. Pregnant women should receive 1 dose of Tdap vaccine during each pregnancy. 1 dose of Td every 10 years.  Varicella vaccine.** / Consult your health care provider. Pregnant females who do not have evidence of immunity should receive the first dose after pregnancy.  Zoster vaccine.** / 1 dose for adults aged 30 years or older.  Measles, mumps, rubella (MMR) vaccine.** / You need at least 1 dose of MMR if you were born in 1957 or later. You may also need a second dose. For females of childbearing age, rubella immunity should be determined. If there is no evidence of immunity, females who are not pregnant should be vaccinated. If there is no evidence of immunity, females who are pregnant should delay immunization until after pregnancy.  Pneumococcal 13-valent conjugate (PCV13) vaccine.** / Consult your health care provider.  Pneumococcal polysaccharide (PPSV23) vaccine.** / 1 to 2 doses if you smoke cigarettes or if you have certain conditions.  Meningococcal vaccine.** /  Consult your health care provider.  Hepatitis A vaccine.** / Consult your health care provider.  Hepatitis B vaccine.** / Consult your health care provider.  Haemophilus influenzae type b (Hib) vaccine.** / Consult your health care provider. Ages 64 years and over  Blood pressure check.** / Every year.  Lipid and cholesterol check.** / Every 5 years beginning at age 23 years.  Lung cancer screening. / Every year if you  are aged 16-80 years and have a 30-pack-year history of smoking and currently smoke or have quit within the past 15 years. Yearly screening is stopped once you have quit smoking for at least 15 years or develop a health problem that would prevent you from having lung cancer treatment.  Clinical breast exam.** / Every year after age 74 years.  BRCA-related cancer risk assessment.** / For women who have family members with a BRCA-related cancer (breast, ovarian, tubal, or peritoneal cancers).  Mammogram.** / Every year beginning at age 44 years and continuing for as long as you are in good health. Consult with your health care provider.  Pap test.** / Every 3 years starting at age 58 years through age 22 or 39 years with 3 consecutive normal Pap tests. Testing can be stopped between 65 and 70 years with 3 consecutive normal Pap tests and no abnormal Pap or HPV tests in the past 10 years.  HPV screening.** / Every 3 years from ages 64 years through ages 70 or 61 years with a history of 3 consecutive normal Pap tests. Testing can be stopped between 65 and 70 years with 3 consecutive normal Pap tests and no abnormal Pap or HPV tests in the past 10 years.  Fecal occult blood test (FOBT) of stool. / Every year beginning at age 40 years and continuing until age 27 years. You may not need to do this test if you get a colonoscopy every 10 years.  Flexible sigmoidoscopy or colonoscopy.** / Every 5 years for a flexible sigmoidoscopy or every 10 years for a colonoscopy beginning at age 7 years and continuing until age 32 years.  Hepatitis C blood test.** / For all people born from [birth year] through 1963 and any individual with known risks for hepatitis C.  Osteoporosis screening.** / A one-time screening for women ages 30 years and over and women at risk for fractures or osteoporosis.  Skin self-exam. / Monthly.  Influenza vaccine. / Every year.  Tetanus, diphtheria, and acellular pertussis (Tdap/Td)  vaccine.** / 1 dose of Td every 10 years.  Varicella vaccine.** / Consult your health care provider.  Zoster vaccine.** / 1 dose for adults aged 35 years or older.  Pneumococcal 13-valent conjugate (PCV13) vaccine.** / Consult your health care provider.  Pneumococcal polysaccharide (PPSV23) vaccine.** / 1 dose for all adults aged 46 years and older.  Meningococcal vaccine.** / Consult your health care provider.  Hepatitis A vaccine.** / Consult your health care provider.  Hepatitis B vaccine.** / Consult your health care provider.  Haemophilus influenzae type b (Hib) vaccine.** / Consult your health care provider. ** Family history and personal history of risk and conditions may change your health care provider's recommendations.   This information is not intended to replace advice given to you by your health care provider. Make sure you discuss any questions you have with your health care provider.   Document Released: 07/09/2001 Document Revised: 06/03/2014 Document Reviewed: 10/08/2010 Elsevier Interactive Patient Education [employer].

## 2016-01-11 NOTE — Progress Notes (Signed)
Pre visit review using our clinic review tool, if applicable. No additional management support is needed unless otherwise documented below in the visit note. 

## 2016-01-12 ENCOUNTER — Encounter: Payer: Self-pay | Admitting: Family Medicine

## 2016-01-12 LAB — URINE CULTURE

## 2016-01-12 MED ORDER — METOPROLOL TARTRATE 50 MG PO TABS: 50.0000 mg | ORAL_TABLET | Freq: Two times a day (BID) | ORAL | 3 refills | Status: DC

## 2016-01-12 MED ORDER — LISINOPRIL 10 MG PO TABS: 10.0000 mg | ORAL_TABLET | Freq: Every day | ORAL | 3 refills | Status: DC

## 2016-01-12 MED ORDER — FUROSEMIDE 20 MG PO TABS: 20.0000 mg | ORAL_TABLET | Freq: Every day | ORAL | 3 refills | Status: DC

## 2016-02-05 ENCOUNTER — Other Ambulatory Visit: Payer: Self-pay | Admitting: Pharmacist

## 2016-02-05 ENCOUNTER — Telehealth: Payer: Self-pay

## 2016-02-05 DIAGNOSIS — N390 Urinary tract infection, site not specified: Secondary | ICD-10-CM

## 2016-02-05 DIAGNOSIS — D649 Anemia, unspecified: Secondary | ICD-10-CM

## 2016-02-05 DIAGNOSIS — R7989 Other specified abnormal findings of blood chemistry: Secondary | ICD-10-CM

## 2016-02-05 NOTE — Telephone Encounter (Signed)
Patient is requesting  Call back to discuss results of her labs... [phone]

## 2016-02-05 NOTE — Telephone Encounter (Signed)
Anemia-- Increase iron to 2x a day Bun/ cr elevated--- Increase fluid and recheck labs 1 month-----cbcd, ibc, ferritin, cmp   Discussed with patient and she was concerned about her UA and I reviewed it with her, she felt more comfortable rechecking the urine. She is scheduled for 02/13/16 for a lab only appointment.     KP

## 2016-02-05 NOTE — Patient Outreach (Signed)
Outreach call to Emily MilchMary R Sayer regarding her request for [facility]. HIPAA identifiers verified and verbal consent received.   Patient reports that she has been taking her medications, including Januvia, as directed. Denies any missed doses or any barriers to taking her medications such as cost or side effects.   Patient reports that she has no medication questions or concerns at this time. Patient declines to take my phone number as she reports that she would prefer to speak with her PCP or pharmacist who fills her medications about any questions that she has.  Duanne MoronElisabeth Sorcha Rotunno, PharmD Clinical Pharmacist [facility] [phone]

## 2016-02-13 ENCOUNTER — Other Ambulatory Visit (INDEPENDENT_AMBULATORY_CARE_PROVIDER_SITE_OTHER): Payer: Medicare Other

## 2016-02-13 ENCOUNTER — Ambulatory Visit (INDEPENDENT_AMBULATORY_CARE_PROVIDER_SITE_OTHER): Payer: Medicare Other | Admitting: Behavioral Health

## 2016-02-13 ENCOUNTER — Ambulatory Visit: Payer: Self-pay

## 2016-02-13 DIAGNOSIS — R748 Abnormal levels of other serum enzymes: Secondary | ICD-10-CM

## 2016-02-13 DIAGNOSIS — D649 Anemia, unspecified: Secondary | ICD-10-CM | POA: Diagnosis not present

## 2016-02-13 DIAGNOSIS — N390 Urinary tract infection, site not specified: Secondary | ICD-10-CM

## 2016-02-13 DIAGNOSIS — R7989 Other specified abnormal findings of blood chemistry: Secondary | ICD-10-CM

## 2016-02-13 DIAGNOSIS — Z23 Encounter for immunization: Secondary | ICD-10-CM | POA: Diagnosis not present

## 2016-02-13 LAB — CBC WITH DIFFERENTIAL/PLATELET
BASOS ABS: 0 10*3/uL (ref 0.0–0.1)
Basophils Relative: 0.6 % (ref 0.0–3.0)
EOS ABS: 0.4 10*3/uL (ref 0.0–0.7)
Eosinophils Relative: 5.5 % — ABNORMAL HIGH (ref 0.0–5.0)
HEMATOCRIT: 31.3 % — AB (ref 36.0–46.0)
HEMOGLOBIN: 10.7 g/dL — AB (ref 12.0–15.0)
LYMPHS PCT: 27 % (ref 12.0–46.0)
Lymphs Abs: 1.9 10*3/uL (ref 0.7–4.0)
MCHC: 34.1 g/dL (ref 30.0–36.0)
MCV: 101 fl — ABNORMAL HIGH (ref 78.0–100.0)
Monocytes Absolute: 0.9 10*3/uL (ref 0.1–1.0)
Monocytes Relative: 12.7 % — ABNORMAL HIGH (ref 3.0–12.0)
Neutro Abs: 3.8 10*3/uL (ref 1.4–7.7)
Neutrophils Relative %: 54.2 % (ref 43.0–77.0)
Platelets: 221 10*3/uL (ref 150.0–400.0)
RBC: 3.1 Mil/uL — AB (ref 3.87–5.11)
RDW: 14 % (ref 11.5–15.5)
WBC: 7.1 10*3/uL (ref 4.0–10.5)

## 2016-02-13 LAB — IBC PANEL
IRON: 60 ug/dL (ref 42–145)
Saturation Ratios: 16.4 % — ABNORMAL LOW (ref 20.0–50.0)
TRANSFERRIN: 262 mg/dL (ref 212.0–360.0)

## 2016-02-13 LAB — POC URINALSYSI DIPSTICK (AUTOMATED)
BILIRUBIN UA: NEGATIVE
Glucose, UA: NEGATIVE
Ketones, UA: NEGATIVE
NITRITE UA: NEGATIVE
PH UA: 6
PROTEIN UA: NEGATIVE
RBC UA: NEGATIVE
Spec Grav, UA: 1.025
Urobilinogen, UA: 0.2

## 2016-02-13 LAB — COMPREHENSIVE METABOLIC PANEL
ALT: 10 U/L (ref 0–35)
AST: 10 U/L (ref 0–37)
Albumin: 3.9 g/dL (ref 3.5–5.2)
Alkaline Phosphatase: 39 U/L (ref 39–117)
BUN: 34 mg/dL — ABNORMAL HIGH (ref 6–23)
CALCIUM: 9.6 mg/dL (ref 8.4–10.5)
CHLORIDE: 108 meq/L (ref 96–112)
CO2: 29 meq/L (ref 19–32)
Creatinine, Ser: 1.37 mg/dL — ABNORMAL HIGH (ref 0.40–1.20)
GFR: 39.03 mL/min — AB (ref >60.00)
Glucose, Bld: 120 mg/dL — ABNORMAL HIGH (ref 70–99)
Potassium: 5.2 mEq/L — ABNORMAL HIGH (ref 3.5–5.1)
Sodium: 142 mEq/L (ref 135–145)
Total Bilirubin: 0.5 mg/dL (ref 0.2–1.2)
Total Protein: 6.8 g/dL (ref 6.0–8.3)

## 2016-02-13 LAB — FERRITIN: Ferritin: 23.1 ng/mL (ref 10.0–291.0)

## 2016-02-13 NOTE — Progress Notes (Addendum)
Pre visit review using our clinic review tool, if applicable. No additional management support is needed unless otherwise documented below in the visit note.  Patient in clinic today for Influenza vaccination. IM given in Left Deltoid. Patient tolerated injection well. 

## 2016-02-15 LAB — URINE CULTURE

## 2016-02-19 ENCOUNTER — Other Ambulatory Visit: Payer: Self-pay

## 2016-02-19 MED ORDER — NITROFURANTOIN MONOHYD MACRO 100 MG PO CAPS: 100.0000 mg | ORAL_CAPSULE | Freq: Two times a day (BID) | ORAL | 0 refills | Status: DC

## 2016-02-20 ENCOUNTER — Other Ambulatory Visit: Payer: Self-pay

## 2016-02-20 DIAGNOSIS — N390 Urinary tract infection, site not specified: Secondary | ICD-10-CM

## 2016-02-21 ENCOUNTER — Other Ambulatory Visit: Payer: Self-pay

## 2016-02-21 DIAGNOSIS — E875 Hyperkalemia: Secondary | ICD-10-CM

## 2016-02-26 ENCOUNTER — Telehealth: Payer: Self-pay | Admitting: Family Medicine

## 2016-02-26 NOTE — Telephone Encounter (Signed)
Patient aware.

## 2016-02-26 NOTE — Telephone Encounter (Signed)
Patient has labs scheduled for 10/10 and she finishes her medication that we are checking with her labs today. She would like to know if she should come in sooner for labs. Please advise.  Patient Relation: self Patient phone [phone]

## 2016-02-26 NOTE — Telephone Encounter (Signed)
No keep her apt on 10/10/    KP

## 2016-03-05 ENCOUNTER — Other Ambulatory Visit (INDEPENDENT_AMBULATORY_CARE_PROVIDER_SITE_OTHER): Payer: Medicare Other

## 2016-03-05 DIAGNOSIS — E875 Hyperkalemia: Secondary | ICD-10-CM

## 2016-03-05 DIAGNOSIS — N390 Urinary tract infection, site not specified: Secondary | ICD-10-CM | POA: Diagnosis not present

## 2016-03-05 LAB — BASIC METABOLIC PANEL
BUN: 22 mg/dL (ref 6–23)
CHLORIDE: 108 meq/L (ref 96–112)
CO2: 28 mEq/L (ref 19–32)
CREATININE: 1.29 mg/dL — AB (ref 0.40–1.20)
Calcium: 9.3 mg/dL (ref 8.4–10.5)
GFR: 41.83 mL/min — ABNORMAL LOW (ref >60.00)
GLUCOSE: 194 mg/dL — AB (ref 70–99)
POTASSIUM: 4 meq/L (ref 3.5–5.1)
Sodium: 143 mEq/L (ref 135–145)

## 2016-03-05 LAB — POC URINALSYSI DIPSTICK (AUTOMATED)
BILIRUBIN UA: NEGATIVE
Glucose, UA: NEGATIVE
KETONES UA: NEGATIVE
LEUKOCYTES UA: NEGATIVE
Nitrite, UA: NEGATIVE
Protein, UA: NEGATIVE
Urobilinogen, UA: 0.2
pH, UA: 6

## 2016-03-06 DIAGNOSIS — H40053 Ocular hypertension, bilateral: Secondary | ICD-10-CM | POA: Diagnosis not present

## 2016-03-06 DIAGNOSIS — Z961 Presence of intraocular lens: Secondary | ICD-10-CM | POA: Diagnosis not present

## 2016-03-06 LAB — URINE CULTURE

## 2016-03-12 ENCOUNTER — Ambulatory Visit: Payer: Self-pay | Admitting: Family Medicine

## 2016-03-21 ENCOUNTER — Encounter: Payer: Self-pay | Admitting: Family Medicine

## 2016-03-21 ENCOUNTER — Ambulatory Visit (INDEPENDENT_AMBULATORY_CARE_PROVIDER_SITE_OTHER): Payer: Medicare Other | Admitting: Family Medicine

## 2016-03-21 ENCOUNTER — Ambulatory Visit (HOSPITAL_BASED_OUTPATIENT_CLINIC_OR_DEPARTMENT_OTHER)
Admission: RE | Admit: 2016-03-21 | Discharge: 2016-03-21 | Disposition: A | Discharge status: Home / Self Care | Payer: Medicare Other | Source: Ambulatory Visit | Attending: Family Medicine | Admitting: Family Medicine

## 2016-03-21 VITALS — BP 170/70 | HR 58 | Temp 98.8°F | Resp 16 | Ht 60.0 in | Wt 126.6 lb

## 2016-03-21 DIAGNOSIS — M545 Low back pain, unspecified: Secondary | ICD-10-CM

## 2016-03-21 DIAGNOSIS — G8929 Other chronic pain: Secondary | ICD-10-CM | POA: Insufficient documentation

## 2016-03-21 DIAGNOSIS — I10 Essential (primary) hypertension: Secondary | ICD-10-CM | POA: Diagnosis not present

## 2016-03-21 DIAGNOSIS — M25551 Pain in right hip: Secondary | ICD-10-CM | POA: Insufficient documentation

## 2016-03-21 DIAGNOSIS — M1611 Unilateral primary osteoarthritis, right hip: Secondary | ICD-10-CM | POA: Diagnosis not present

## 2016-03-21 DIAGNOSIS — E1159 Type 2 diabetes mellitus with other circulatory complications: Secondary | ICD-10-CM | POA: Diagnosis not present

## 2016-03-21 DIAGNOSIS — I214 Non-ST elevation (NSTEMI) myocardial infarction: Secondary | ICD-10-CM | POA: Diagnosis not present

## 2016-03-21 MED ORDER — LISINOPRIL 20 MG PO TABS: 20.0000 mg | ORAL_TABLET | Freq: Every day | ORAL | 1 refills | Status: DC

## 2016-03-21 NOTE — Assessment & Plan Note (Signed)
Per cardiology 

## 2016-03-21 NOTE — Progress Notes (Signed)
Pre visit review using our clinic review tool, if applicable. No additional management support is needed unless otherwise documented below in the visit note. 

## 2016-03-21 NOTE — Patient Instructions (Signed)

## 2016-03-21 NOTE — Assessment & Plan Note (Signed)
See labs con't meds 

## 2016-03-21 NOTE — Progress Notes (Signed)
Patient ID: Emily MilchMary R Daniel, female    DOB: 1929-11-16  Age: 82 y.o. MRN: [id number]    Subjective:  Subjective  HPI Emily Daniel presents for c/o swelling in L leg after standing a long period of time.  It is better but she is still c/o of some swelling. She also c/o R hip pain x 2 years.  No known injury.  She has never been seen for this.  Pain does not radiate.    Review of Systems  Constitutional: Negative for activity change, appetite change, chills, diaphoresis, fatigue, fever and unexpected weight change.  Eyes: Negative for pain, redness and visual disturbance.  Respiratory: Negative for cough, chest tightness, shortness of breath and wheezing.   Cardiovascular: Positive for leg swelling. Negative for chest pain and palpitations.  Gastrointestinal: Negative for abdominal distention and abdominal pain.  Endocrine: Negative for cold intolerance, heat intolerance, polydipsia, polyphagia and polyuria.  Genitourinary: Negative for difficulty urinating, dyspareunia, dysuria, flank pain, frequency, genital sores, hematuria, menstrual problem, pelvic pain, urgency, vaginal discharge and vaginal pain.  Musculoskeletal: Positive for arthralgias and gait problem. Negative for back pain.  Neurological: Negative for dizziness, light-headedness, numbness and headaches.    History Past Medical History:  Diagnosis Date  . Anxiety   . Atrial fibrillation (HCC)    post op >>> req'd Amio and Coumadin  . AVM (arteriovenous malformation)   . CAD (coronary artery disease)    a. NSTEMI 10/2013 >>> LHC (10/2013):  Distal left main 90%, proximal LAD 30%, mid LAD 20%, ostial circumflex 99%, then 80%, mid RCA 99%, EF 30-35%, 3-4+ MR; proximal right iliac occluded, right renal 50%, ostial left iliac 70% >>> CABG/MV repair  . Cataract   . CHF (congestive heart failure) (HCC)    Dr. Dorris FetchHendrickson following  . Chronic systolic heart failure (HCC)   . Diabetes mellitus, type 2 (HCC)   . Hemorrhoids   . Hx  of echocardiogram    Echo (9/15):  Mild LVH, EF 40-45%, inf AK, Gr 1 DD, trivial AI, MV repair ok (mild MS, mild MR), mild LAE, PASP 32 mmHg.  [name]. Hyperlipidemia   . Hypertension   . Ischemic cardiomyopathy    a. Echo (11/08/13):  EF 30-35%, diffuse hypokinesis, severe MR, mild LAE, PASP 48 mm Hg  . Mitral regurgitation    a. s/p MV repair at time of CABG  . Osteoporosis   . PVD (peripheral vascular disease) (HCC)   . Vitamin B12 deficiency     She has a past surgical history that includes Appendectomy; Abdominal hysterectomy; Hemorrhoid surgery; Mitral valve replacement (mvr)/coronary artery bypass grafting (cabg) (N/A, 11/11/2013); left and right heart catheterization with coronary angiogram (N/A, 11/10/2013); and Coronary artery bypass graft (1990, 1995, 1998).   Her family history includes Cancer in her brother; Cervical cancer in her mother; Hypertension in her other and sister; Stroke in her other.She reports that she quit smoking about 32 years ago. Her smoking use included Cigarettes. She has a 36.00 pack-year smoking history. She has never used smokeless tobacco. She reports that she does not drink alcohol or use drugs.  Current Outpatient Prescriptions on File Prior to Visit  Medication Sig Dispense Refill  . aspirin 325 MG tablet Take 325 mg by mouth daily.    . Calcium Carbonate-Vitamin D (CALCIUM-VITAMIN D) 600-200 MG-UNIT CAPS Take 1 capsule by mouth 2 (two) times daily.     [name]. Casanthranol-Docusate Sodium 30-100 MG CAPS Take 1 capsule by mouth daily as needed (for constipation).     .[name]  ferrous sulfate (IRON SUPPLEMENT) 325 (65 FE) MG tablet Take 325 mg by mouth daily with breakfast.    . furosemide (LASIX) 20 MG tablet Take 1 tablet (20 mg total) by mouth daily. 90 tablet 3  . metoprolol (LOPRESSOR) 50 MG tablet Take 1 tablet (50 mg total) by mouth 2 (two) times daily. 180 tablet 3  . Multiple Vitamin (MULTIVITAMIN) tablet Take 1 tablet by mouth daily.      . sitaGLIPtin (JANUVIA)  100 MG tablet Take 1 tablet (100 mg total) by mouth daily. 30 tablet 5  . vitamin B-12 (CYANOCOBALAMIN) 1000 MCG tablet Take 1,000 mcg by mouth daily.      . Multiple Vitamins-Minerals (PRESERVISION AREDS) TABS Take 2 tablets by mouth daily.    . nitrofurantoin, macrocrystal-monohydrate, (MACROBID) 100 MG capsule Take 1 capsule (100 mg total) by mouth 2 (two) times daily. (Patient not taking: Reported on 03/21/2016) 14 capsule 0   No current facility-administered medications on file prior to visit.      Objective:  Objective  Physical Exam  Constitutional: She is oriented to person, place, and time. She appears well-developed and well-nourished.  HENT:  Head: Normocephalic and atraumatic.  Eyes: Conjunctivae and EOM are normal.  Neck: Normal range of motion. Neck supple. No JVD present. Carotid bruit is not present. No thyromegaly present.  Cardiovascular: Normal rate, regular rhythm and normal heart sounds.   No murmur heard. Pulmonary/Chest: Effort normal and breath sounds normal. No respiratory distress. She has no wheezes. She has no rales. She exhibits no tenderness.  Musculoskeletal: She exhibits edema and tenderness.       Legs: Neurological: She is alert and oriented to person, place, and time.  Psychiatric: She has a normal mood and affect.  Nursing note and vitals reviewed.  BP (!) 170/70 (BP Location: Left Arm, Patient Position: Sitting, Cuff Size: Normal)   Pulse (!) 58   Temp 98.8 F (37.1 C) (Oral)   Resp 16   Ht 5' (1.524 m)   Wt 126 lb 9.6 oz (57.4 kg)   SpO2 98%   BMI 24.72 kg/m  Wt Readings from Last 3 Encounters:  03/21/16 126 lb 9.6 oz (57.4 kg)  01/11/16 122 lb 9.6 oz (55.6 kg)  01/03/16 126 lb (57.2 kg)     Lab Results  Component Value Date   WBC 7.1 02/13/2016   HGB 10.7 (L) 02/13/2016   HCT 31.3 (L) 02/13/2016   PLT 221.0 02/13/2016   GLUCOSE 194 (H) 03/05/2016   CHOL 214 (H) 01/11/2016   TRIG 165.0 (H) 01/11/2016   HDL 44.40 01/11/2016    LDLDIRECT 156.0 03/13/2009   LDLCALC 137 (H) 01/11/2016   ALT 10 02/13/2016   AST 10 02/13/2016   NA 143 03/05/2016   K 4.0 03/05/2016   CL 108 03/05/2016   CREATININE 1.29 (H) 03/05/2016   BUN 22 03/05/2016   CO2 28 03/05/2016   TSH 0.942 11/17/2013   INR 2.5 05/02/2014   HGBA1C 5.1 01/11/2016   MICROALBUR 1.6 10/25/2014    Dg Chest 2 View  Result Date: 12/14/2013 CLINICAL DATA:  History of mitral valve replacement, CABG 10/2013 EXAM: CHEST  2 VIEW COMPARISON:  11/19/2013 FINDINGS: The lungs are hyperinflated likely secondary to COPD. There are trace bilateral pleural effusions. There is no focal consolidation or pneumothorax. There is stable cardiomegaly. There is evidence of prior CABG mitral valve replacement. The osseous structures are unremarkable. IMPRESSION: Trace bilateral pleural effusions, otherwise no active cardiopulmonary disease. Electronically Signed  By: Elige Ko   On: 12/14/2013 10:39     Assessment & Plan:  Plan  I have discontinued Ms. Raftery's lisinopril. I am also having her start on lisinopril. Additionally, I am having her maintain her Calcium-Vitamin D, multivitamin, vitamin B-12, Casanthranol-Docusate Sodium, aspirin, ferrous sulfate, PRESERVISION AREDS, sitaGLIPtin, metoprolol, furosemide, and nitrofurantoin (macrocrystal-monohydrate).  Meds ordered this encounter  Medications  . lisinopril (PRINIVIL,ZESTRIL) 20 MG tablet    Sig: Take 1 tablet (20 mg total) by mouth daily.    Dispense:  90 tablet    Refill:  1    Problem List Items Addressed This Visit      Unprioritized   Essential hypertension - Primary   Relevant Medications   lisinopril (PRINIVIL,ZESTRIL) 20 MG tablet   NSTEMI (non-ST elevated myocardial infarction) [facility])    Per cardiology      Relevant Medications   lisinopril (PRINIVIL,ZESTRIL) 20 MG tablet   Type 2 diabetes mellitus with circulatory disorder (HCC)    See labs  con't meds      Relevant Medications   lisinopril  (PRINIVIL,ZESTRIL) 20 MG tablet    Other Visit Diagnoses    Hip pain, chronic, right       Relevant Orders   DG HIP UNILAT WITH PELVIS 1V RIGHT (Completed)   Acute bilateral low back pain without sciatica       Relevant Orders   DG Lumbar Spine 2-3 Views (Completed)      Follow-up: Return if symptoms worsen or fail to improve.  Donato Schultz, DO

## 2016-04-17 ENCOUNTER — Encounter (INDEPENDENT_AMBULATORY_CARE_PROVIDER_SITE_OTHER): Payer: Self-pay

## 2016-04-17 ENCOUNTER — Ambulatory Visit (INDEPENDENT_AMBULATORY_CARE_PROVIDER_SITE_OTHER): Payer: Medicare Other | Admitting: Cardiovascular Disease

## 2016-04-17 VITALS — BP 150/80 | HR 80 | Ht 60.0 in | Wt 129.4 lb

## 2016-04-17 DIAGNOSIS — R0989 Other specified symptoms and signs involving the circulatory and respiratory systems: Secondary | ICD-10-CM | POA: Diagnosis not present

## 2016-04-17 NOTE — Patient Instructions (Signed)
Medication Instructions:  Your physician recommends that you continue on your current medications as directed. Please refer to the Current Medication list given to you today.   Labwork: None  Testing/Procedures: Your physician has requested that you have a carotid duplex. This test is an ultrasound of the carotid arteries in your neck. It looks at blood flow through these arteries that supply the brain with blood. Allow one hour for this exam. There are no restrictions or special instructions.  Follow-Up: Your physician recommends that you schedule a follow-up appointment in 3 months with Dr. Kirke CorinArida.  Your physician wants you to follow-up in: 6 months with Dr. Eden EmmsNishan. You will receive a reminder letter in the mail two months in advance. If you don't receive a letter, please call our office to schedule the follow-up appointment.   Any Other Special Instructions Will Be Listed Below (If Applicable).     If you need a refill on your cardiac medications before your next appointment, please call your pharmacy.

## 2016-04-17 NOTE — Progress Notes (Signed)
Patient ID: Emily Daniel, female   DOB: March 08, 1930, 82 y.o.   MRN: [id number]  Primary cardiologist: Dr. Eden Emms  HPI  Emily Daniel is a 82 y.o.  female who was is here today for a follow-up visit regarding  CAD and PVD  She has known history of CAD (question of remote MI in the 50s), HTN, HL, diabetes, PAD, AVMs, prior tobacco abuse. She was admitted 10/2013 with a NSTEMI c/b acute systolic CHF. Cardiac catheterization demonstrated severe 3 vessel CAD with left main involvement, severe mitral regurgitation and EF was 30-35% by echocardiogram >>>  CABG with mitral valve repair as well as closure of PFO by Dr. Dorris Fetch. 11/13/13  PROCEDURE: Median sternotomy, extracorporeal circulation, coronary artery bypass grafting x3 (left internal mammary artery to left anterior descending, saphenous vein graft to obtuse marginal 1, saphenous vein graft to distal right coronary), mitral valve repair with Sorin memo 3D 30 mm annuloplasty ring, serial # [id number], closure of patent foramen ovale.   Postoperative course was complicated by distal emboli to her toes managed conservatively. She had atrial fibrillation and was placed on amiodarone and Coumadin secondary to intermittent atrial fibrillation but was discharged in NSR.   She was seen for bilateral leg pain mostly at the vein harvest site in both legs. She underwent noninvasive evaluation which showed normal ABI on the left side and severely reduced on the right and 0.4 with evidence of severe inflow disease. The patient does complain of right hip pain after walking about 100 feet. This forces her to stop and rest for few minutes before she can resume.   Dr Kirke Corin ordered an  aortoiliac duplex which showed occlusion of the right common iliac artery with significant stenosis affecting the left common iliac artery. The patient used to follow-up with Dr. Darrick Penna for many years for iliac disease that was monitored according to the patient  She continues  to be very active and overall able to perform activities of daily living including yard work. She does get right hip pain with activities but she does not feel very limited by this.  She takes BP at home in am and they are fine Compliant with meds   Stepped on hickory nut and left foot sore   Allergies  Allergen Reactions  . Meperidine Hcl Nausea And Vomiting  . Sulfonamide Derivatives Nausea And Vomiting  . Demerol [Meperidine]     Unknown per pt      Current Outpatient Prescriptions on File Prior to Visit  Medication Sig Dispense Refill  . aspirin 325 MG tablet Take 325 mg by mouth daily.    . Calcium Carbonate-Vitamin D (CALCIUM-VITAMIN D) 600-200 MG-UNIT CAPS Take 1 capsule by mouth 2 (two) times daily.     Emily Daniel Sodium 30-100 MG CAPS Take 1 capsule by mouth daily as needed (for constipation).     . furosemide (LASIX) 20 MG tablet Take 1 tablet (20 mg total) by mouth daily. 90 tablet 3  . lisinopril (PRINIVIL,ZESTRIL) 20 MG tablet Take 1 tablet (20 mg total) by mouth daily. 90 tablet 1  . metoprolol (LOPRESSOR) 50 MG tablet Take 1 tablet (50 mg total) by mouth 2 (two) times daily. 180 tablet 3  . Multiple Vitamin (MULTIVITAMIN) tablet Take 1 tablet by mouth daily.      . sitaGLIPtin (JANUVIA) 100 MG tablet Take 1 tablet (100 mg total) by mouth daily. 30 tablet 5  . vitamin B-12 (CYANOCOBALAMIN) 1000 MCG tablet Take 1,000 mcg by mouth daily.  No current facility-administered medications on file prior to visit.      Past Medical History:  Diagnosis Date  . Anxiety   . Atrial fibrillation (HCC)    post op >>> req'd Amio and Coumadin  . AVM (arteriovenous malformation)   . CAD (coronary artery disease)    a. NSTEMI 10/2013 >>> LHC (10/2013):  Distal left main 90%, proximal LAD 30%, mid LAD 20%, ostial circumflex 99%, then 80%, mid RCA 99%, EF 30-35%, 3-4+ MR; proximal right iliac occluded, right renal 50%, ostial left iliac 70% >>> CABG/MV repair  .  Cataract   . CHF (congestive heart failure) (HCC)    Dr. Dorris FetchHendrickson following  . Chronic systolic heart failure (HCC)   . Diabetes mellitus, type 2 (HCC)   . Hemorrhoids   . Hx of echocardiogram    Echo (9/15):  Mild LVH, EF 40-45%, inf AK, Gr 1 DD, trivial AI, MV repair ok (mild MS, mild MR), mild LAE, PASP 32 mmHg.  [name]. Hyperlipidemia   . Hypertension   . Ischemic cardiomyopathy    a. Echo (11/08/13):  EF 30-35%, diffuse hypokinesis, severe MR, mild LAE, PASP 48 mm Hg  . Mitral regurgitation    a. s/p MV repair at time of CABG  . Osteoporosis   . PVD (peripheral vascular disease) (HCC)   . Vitamin B12 deficiency      Past Surgical History:  Procedure Laterality Date  . ABDOMINAL HYSTERECTOMY     1969  . APPENDECTOMY     1945  . CORONARY ARTERY BYPASS GRAFT  1990, 1995, 1998  . HEMORRHOID SURGERY     1959  . LEFT AND RIGHT HEART CATHETERIZATION WITH CORONARY ANGIOGRAM N/A 11/10/2013   Procedure: LEFT AND RIGHT HEART CATHETERIZATION WITH CORONARY ANGIOGRAM;  Surgeon: Lennette Biharihomas A Kelly, MD;  [facility];  Service: Cardiovascular;  Laterality: N/A;  . MITRAL VALVE REPLACEMENT (MVR)/CORONARY ARTERY BYPASS GRAFTING (CABG) N/A 11/11/2013   Procedure: MITRAL VALVE Ring repair (MVR)/CORONARY ARTERY BYPASS GRAFTING (CABG) times three using left internal mammary and left sphenous vein.;  Surgeon: Loreli SlotSteven C Hendrickson, MD;  Location: [facility] OR;  Service: Open Heart Surgery;  Laterality: N/A;  Coronary artery bypass graft times three using left internal mammary artery and left saphenous leg vein using endoscope.  Exploration of right leg.      Family History  Problem Relation Age of Onset  . Cervical cancer Mother   . Cancer Brother     renal ...x3??  [name]. Hypertension Other   . Stroke Other   . Hypertension Sister   . Heart attack Neg Hx      Social History   Social History  . Marital status: Widowed    Spouse name: N/A  . Number of children: N/A  . Years of education: N/A    Occupational History  . retired Retired   Social History Main Topics  . Smoking status: Former Smoker    Packs/day: 1.00    Years: 36.00    Types: Cigarettes    Quit date: 02/18/1984  . Smokeless tobacco: Never Used  . Alcohol use No  . Drug use: No  . Sexual activity: Not Currently   Other Topics Concern  . Not on file   Social History Narrative   Exercise-- yard, house work    ECG:  SR rate 67  PR 236  2016  01/02/16  SR rate 69 PR 268    ROS A 10 point review of system was performed. It is  negative other than that mentioned in the history of present illness.   PHYSICAL EXAM  BP (!) 182/80   Pulse 80   Ht 5' (1.524 m)   Wt 58.7 kg (129 lb 6.4 oz)   BMI 25.27 kg/m   Affect appropriate Elderly white female  HEENT: normal Neck supple with no adenopathy JVP normal left bruits no thyromegaly Lungs clear with no wheezing and good diaphragmatic motion Heart:  S1/S2 no murmur, no rub, gallop or click PMI normal Abdomen: benighn, BS positve, no tenderness, no AAA   No HSM or HJR Distal pulses intact with no bruits No edema Neuro non-focal Skin warm and dry No muscular weakness Pedal pulses hard to palpate on right      ASSESSMENT AND PLAN CAD Stable with no angina and good activity level.  Continue medical Rx CABG 2015 MV Repair no residual murmur excellent result  SBE PVD stable RLE f/u Arida  No indication for angio at this time  Occluded right common iliac artery  Left bruit f/u carotid dupex   Chol  At goal  Lab Results  Component Value Date   LDLCALC 137 (H) 01/11/2016    HTN some white coat component continue current meds   F/U Arida 3 months and me 6  Carotid duplex ordered   Charlton HawsPeter Evann Erazo

## 2016-05-10 ENCOUNTER — Ambulatory Visit (HOSPITAL_COMMUNITY)
Admission: RE | Admit: 2016-05-10 | Discharge: 2016-05-10 | Disposition: A | Discharge status: Home / Self Care | Payer: Medicare Other | Source: Ambulatory Visit | Attending: Cardiology | Admitting: Cardiology

## 2016-05-10 DIAGNOSIS — R0989 Other specified symptoms and signs involving the circulatory and respiratory systems: Secondary | ICD-10-CM | POA: Diagnosis not present

## 2016-05-10 DIAGNOSIS — I6523 Occlusion and stenosis of bilateral carotid arteries: Secondary | ICD-10-CM | POA: Diagnosis not present

## 2016-06-17 ENCOUNTER — Telehealth: Payer: Self-pay | Admitting: Family Medicine

## 2016-06-17 NOTE — Telephone Encounter (Signed)
I need more details --- we did not talk about cortisone injections. ----- I do not do joint injections

## 2016-06-17 NOTE — Telephone Encounter (Signed)
Called the patient back and line busy.

## 2016-06-17 NOTE — Telephone Encounter (Signed)
Caller name: Relationship to patient: Self Can be reached: [phone] Pharmacy:  Reason for call: Patient is requesting to have the cortisone injections. States she has discussed with PCP and was told to call back if she decided to have the injections.

## 2016-06-18 ENCOUNTER — Other Ambulatory Visit: Payer: Self-pay | Admitting: Family Medicine

## 2016-06-18 DIAGNOSIS — M549 Dorsalgia, unspecified: Secondary | ICD-10-CM

## 2016-06-18 DIAGNOSIS — M545 Low back pain: Secondary | ICD-10-CM

## 2016-06-18 NOTE — Telephone Encounter (Signed)
Ortho is only here Mon and Friday mornings--- [place] Dr Roda Shuttersxu is Friday am We can but referral in and see what is available but most likely injections would need to be scheduled after

## 2016-06-18 NOTE — Telephone Encounter (Signed)
Referral done

## 2016-06-18 NOTE — Telephone Encounter (Signed)
The patient is referring to imaging results from 03/22/2016 of her back.  You suggested a referral to ortho/injection candidate possible.  Well now she would like that referral to Dr. Magnus IvanBlackman in our building and IF possible on the same day as her next appt. With PCP on 07/12/2016??

## 2016-07-01 ENCOUNTER — Ambulatory Visit (INDEPENDENT_AMBULATORY_CARE_PROVIDER_SITE_OTHER): Payer: Medicare Other | Admitting: Orthopaedic Surgery

## 2016-07-01 ENCOUNTER — Encounter (INDEPENDENT_AMBULATORY_CARE_PROVIDER_SITE_OTHER): Payer: Self-pay | Admitting: Orthopaedic Surgery

## 2016-07-01 DIAGNOSIS — M25551 Pain in right hip: Secondary | ICD-10-CM

## 2016-07-01 DIAGNOSIS — M7061 Trochanteric bursitis, right hip: Secondary | ICD-10-CM | POA: Diagnosis not present

## 2016-07-01 DIAGNOSIS — M545 Low back pain, unspecified: Secondary | ICD-10-CM | POA: Insufficient documentation

## 2016-07-01 DIAGNOSIS — G8929 Other chronic pain: Secondary | ICD-10-CM | POA: Diagnosis not present

## 2016-07-01 MED ORDER — LIDOCAINE HCL 1 % IJ SOLN
3.0000 mL | INTRAMUSCULAR | Status: AC | PRN
  Administered 2016-07-01: 3 mL

## 2016-07-01 MED ORDER — METHYLPREDNISOLONE ACETATE 40 MG/ML IJ SUSP
40.0000 mg | INTRAMUSCULAR | Status: AC | PRN
  Administered 2016-07-01: 40 mg via INTRA_ARTICULAR

## 2016-07-01 NOTE — Progress Notes (Signed)
Office Visit Note   Patient: Emily Daniel           Date of Birth: 05-Oct-1929           MRN: [id number] Visit Date: 07/01/2016              Requested by: Donato SchultzYvonne R Lowne Chase, DO [address] [place], [place] [id number] PCP: Donato SchultzYvonne R Lowne Chase, DO   Assessment & Plan: Visit Diagnoses:  1. Pain of right hip joint   2. Trochanteric bursitis, right hip     Plan: Tolerated the injection well. I would like to send her to physical therapy for the trochanteric area as well as her low back but she will send her for this and see how the injection does. I told her we could always try this again down the road if it does not get better and I would definitely recommend therapy if it does not get better for her. She understands this. She'll follow-up as needed.  Follow-Up Instructions: Return if symptoms worsen or fail to improve.   Orders:  No orders of the defined types were placed in this encounter.  No orders of the defined types were placed in this encounter.     Procedures: Large Joint Inj Date/Time: 07/01/2016 10:16 AM Performed by: Kathryne HitchBLACKMAN, Makennah Omura Y Authorized by: Kathryne HitchBLACKMAN, Luanna Weesner Y   Indications:  Pain Location:  Hip Site:  R greater trochanter Ultrasound Guidance: No   Fluoroscopic Guidance: No   Arthrogram: No   Medications:  3 mL lidocaine 1 %; 40 mg methylPREDNISolone acetate 40 MG/ML     Clinical Data: No additional findings.   Subjective: Chief Complaint  Patient presents with  . Right Hip - Pain  . Lower Back - Pain    HPI He is a very pleasant 83 year old who ambulates without assistive device. She him since he complaint of right hip and she points to her low back or trochanteric areas a source of her pain. It's been slowly getting worse for a while. She denies any change in bowel or bladder function. She denies any radicular components from her low back and right hip pain. She does have a history of heart surgery and does have  peripheral vascular disease. Review of Systems He currently denies any chest pain, shortness of breath, headache, fever, chills, nausea, vomiting.  Objective: Vital Signs: There were no vitals taken for this visit.  Physical Exam He is alert and oriented 3 and in no acute distress Ortho Exam She is a very thin individual and surprisingly gets up on the exam table easily. Both her right and left hips have fluid range of motion with no pain in the hip itself. She does have pain of the trochanteric area but also in the sciatic region and low back pain. There is not any radicular component of this pain. She's got good strength and normal sensation in her bilateral lower extremities. Specialty Comments:  No specialty comments available.  Imaging: No results found. X-rays on the [place] system or inability reviewed by me. These included lumbar spine films and hip films. Scars are hip goes there is an no significant arthritic changes in her right or left hips. The joint space is well-maintained. There is no significant. Trigger osteophytes. There is no cortical irregularities around the trochanteric area. AP lateral lumbar spine shows some degenerative changes throughout but the vertebral body heights are well maintained. She has some mild scoliosis that she degenerative nature.  There is also facet joint disease and that may be was causing her back pain the most.  PMFS History: Patient Active Problem List   Diagnosis Date Noted  . PAD (peripheral artery disease) (HCC) 08/09/2014  . Leg pain 05/23/2014  . Atrial fibrillation (HCC) 11/22/2013  . Mitral valve disorders(424.0) 11/22/2013  . S/P mitral valve repair 11/19/2013  . S/P CABG x 3 11/11/2013  . Chest pain 11/08/2013  . NSTEMI (non-ST elevated myocardial infarction) (HCC) 11/08/2013  . Iron deficiency anemia 11/08/2013  . Acute heart failure (HCC) 11/08/2013  . Cerumen impaction 09/30/2013  . B12 DEFICIENCY 06/21/2010  .  Osteoporosis 03/16/2010  . BACK PAIN 01/02/2010  . PVD 04/14/2009  . ARTERIOVENOUS MALFORMATION, COLON 04/14/2009  . CHANGE IN BOWELS 04/14/2009  . Type 2 diabetes mellitus with circulatory disorder (HCC) 03/13/2009  . ANXIETY STATE, UNSPECIFIED 03/13/2009  . DYSURIA 04/01/2008  . Hyperlipidemia 05/18/2007  . Essential hypertension 02/02/2007  . ARTIFICIAL MENOPAUSE 02/02/2007   Past Medical History:  Diagnosis Date  . Anxiety   . Atrial fibrillation (HCC)    post op >>> req'd Amio and Coumadin  . AVM (arteriovenous malformation)   . CAD (coronary artery disease)    a. NSTEMI 10/2013 >>> LHC (10/2013):  Distal left main 90%, proximal LAD 30%, mid LAD 20%, ostial circumflex 99%, then 80%, mid RCA 99%, EF 30-35%, 3-4+ MR; proximal right iliac occluded, right renal 50%, ostial left iliac 70% >>> CABG/MV repair  . Cataract   . CHF (congestive heart failure) (HCC)    Dr. Dorris Fetch following  . Chronic systolic heart failure (HCC)   . Diabetes mellitus, type 2 (HCC)   . Hemorrhoids   . Hx of echocardiogram    Echo (9/15):  Mild LVH, EF 40-45%, inf AK, Gr 1 DD, trivial AI, MV repair ok (mild MS, mild MR), mild LAE, PASP 32 mmHg.  [name] Hyperlipidemia   . Hypertension   . Ischemic cardiomyopathy    a. Echo (11/08/13):  EF 30-35%, diffuse hypokinesis, severe MR, mild LAE, PASP 48 mm Hg  . Mitral regurgitation    a. s/p MV repair at time of CABG  . Osteoporosis   . PVD (peripheral vascular disease) (HCC)   . Vitamin B12 deficiency     Family History  Problem Relation Age of Onset  . Cervical cancer Mother   . Cancer Brother     renal ...x3??  [name] Hypertension Other   . Stroke Other   . Hypertension Sister   . Heart attack Neg Hx     Past Surgical History:  Procedure Laterality Date  . ABDOMINAL HYSTERECTOMY     1969  . APPENDECTOMY     1945  . CORONARY ARTERY BYPASS GRAFT  1990, 1995, 1998  . HEMORRHOID SURGERY     1959  . LEFT AND RIGHT HEART CATHETERIZATION WITH CORONARY  ANGIOGRAM N/A 11/10/2013   Procedure: LEFT AND RIGHT HEART CATHETERIZATION WITH CORONARY ANGIOGRAM;  Surgeon: Lennette Bihari, MD;  Location: [facility] CATH LAB;  Service: Cardiovascular;  Laterality: N/A;  . MITRAL VALVE REPLACEMENT (MVR)/CORONARY ARTERY BYPASS GRAFTING (CABG) N/A 11/11/2013   Procedure: MITRAL VALVE Ring repair (MVR)/CORONARY ARTERY BYPASS GRAFTING (CABG) times three using left internal mammary and left sphenous vein.;  Surgeon: Loreli Slot, MD;  Location: [facility] OR;  Service: Open Heart Surgery;  Laterality: N/A;  Coronary artery bypass graft times three using left internal mammary artery and left saphenous leg vein using endoscope.  Exploration of right leg.  Social History   Occupational History  . retired Retired   Social History Main Topics  . Smoking status: Former Smoker    Packs/day: 1.00    Years: 36.00    Types: Cigarettes    Quit date: 02/18/1984  . Smokeless tobacco: Never Used  . Alcohol use No  . Drug use: No  . Sexual activity: Not Currently

## 2016-07-12 ENCOUNTER — Ambulatory Visit (INDEPENDENT_AMBULATORY_CARE_PROVIDER_SITE_OTHER): Payer: Medicare Other | Admitting: Family Medicine

## 2016-07-12 ENCOUNTER — Encounter: Payer: Self-pay | Admitting: Family Medicine

## 2016-07-12 VITALS — BP 180/70 | HR 63 | Temp 98.4°F | Resp 16 | Ht 60.0 in | Wt 126.0 lb

## 2016-07-12 DIAGNOSIS — I10 Essential (primary) hypertension: Secondary | ICD-10-CM | POA: Diagnosis not present

## 2016-07-12 DIAGNOSIS — E1151 Type 2 diabetes mellitus with diabetic peripheral angiopathy without gangrene: Secondary | ICD-10-CM

## 2016-07-12 DIAGNOSIS — E785 Hyperlipidemia, unspecified: Secondary | ICD-10-CM

## 2016-07-12 DIAGNOSIS — I480 Paroxysmal atrial fibrillation: Secondary | ICD-10-CM | POA: Diagnosis not present

## 2016-07-12 DIAGNOSIS — IMO0002 Reserved for concepts with insufficient information to code with codable children: Secondary | ICD-10-CM

## 2016-07-12 DIAGNOSIS — E1159 Type 2 diabetes mellitus with other circulatory complications: Secondary | ICD-10-CM

## 2016-07-12 DIAGNOSIS — Z1231 Encounter for screening mammogram for malignant neoplasm of breast: Secondary | ICD-10-CM | POA: Diagnosis not present

## 2016-07-12 DIAGNOSIS — E1165 Type 2 diabetes mellitus with hyperglycemia: Secondary | ICD-10-CM

## 2016-07-12 LAB — COMPREHENSIVE METABOLIC PANEL
ALT: 11 U/L (ref 0–35)
AST: 11 U/L (ref 0–37)
Albumin: 4.1 g/dL (ref 3.5–5.2)
Alkaline Phosphatase: 38 U/L — ABNORMAL LOW (ref 39–117)
BUN: 22 mg/dL (ref 6–23)
CALCIUM: 9.3 mg/dL (ref 8.4–10.5)
CHLORIDE: 109 meq/L (ref 96–112)
CO2: 26 meq/L (ref 19–32)
CREATININE: 1.28 mg/dL — AB (ref 0.40–1.20)
GFR: 42.17 mL/min — ABNORMAL LOW (ref >60.00)
GLUCOSE: 107 mg/dL — AB (ref 70–99)
Potassium: 3.8 mEq/L (ref 3.5–5.1)
SODIUM: 143 meq/L (ref 135–145)
Total Bilirubin: 0.4 mg/dL (ref 0.2–1.2)
Total Protein: 6.9 g/dL (ref 6.0–8.3)

## 2016-07-12 LAB — CBC
HCT: 30.4 % — ABNORMAL LOW (ref 36.0–46.0)
Hemoglobin: 10.4 g/dL — ABNORMAL LOW (ref 12.0–15.0)
MCHC: 34.1 g/dL (ref 30.0–36.0)
MCV: 99.5 fl (ref 78.0–100.0)
PLATELETS: 221 10*3/uL (ref 150.0–400.0)
RBC: 3.06 Mil/uL — ABNORMAL LOW (ref 3.87–5.11)
RDW: 12 % (ref 11.5–15.5)
WBC: 6.6 10*3/uL (ref 4.0–10.5)

## 2016-07-12 LAB — LIPID PANEL
CHOL/HDL RATIO: 4
Cholesterol: 199 mg/dL (ref 0–200)
HDL: 47.4 mg/dL (ref >39.00)
LDL CALC: 127 mg/dL — AB (ref 0–99)
NonHDL: 151.25
TRIGLYCERIDES: 119 mg/dL (ref 0.0–149.0)
VLDL: 23.8 mg/dL (ref 0.0–40.0)

## 2016-07-12 LAB — HEMOGLOBIN A1C: Hgb A1c MFr Bld: 5.6 % (ref 4.6–6.5)

## 2016-07-12 NOTE — Patient Instructions (Signed)
Carbohydrate Counting for Diabetes Mellitus, Adult Carbohydrate counting is a method for keeping track of how many carbohydrates you eat. Eating carbohydrates naturally increases the amount of sugar (glucose) in the blood. Counting how many carbohydrates you eat helps keep your blood glucose within normal limits, which helps you manage your diabetes (diabetes mellitus). It is important to know how many carbohydrates you can safely have in each meal. This is different for every person. A diet and nutrition specialist (registered dietitian) can help you make a meal plan and calculate how many carbohydrates you should have at each meal and snack. Carbohydrates are found in the following foods:  Grains, such as breads and cereals.  Dried beans and soy products.  Starchy vegetables, such as potatoes, peas, and corn.  Fruit and fruit juices.  Milk and yogurt.  Sweets and snack foods, such as cake, cookies, candy, chips, and soft drinks. How do I count carbohydrates? There are two ways to count carbohydrates in food. You can use either of the methods or a combination of both. Reading "Nutrition Facts" on packaged food  The "Nutrition Facts" list is included on the labels of almost all packaged foods and beverages in the [place] It includes:  The serving size.  Information about nutrients in each serving, including the grams (g) of carbohydrate per serving. To use the "Nutrition Facts":  Decide how many servings you will have.  Multiply the number of servings by the number of carbohydrates per serving.  The resulting number is the total amount of carbohydrates that you will be having. Learning standard serving sizes of other foods  When you eat foods containing carbohydrates that are not packaged or do not include "Nutrition Facts" on the label, you need to measure the servings in order to count the amount of carbohydrates:  Measure the foods that you will eat with a food scale or measuring  cup, if needed.  Decide how many standard-size servings you will eat.  Multiply the number of servings by 15. Most carbohydrate-rich foods have about 15 g of carbohydrates per serving.  For example, if you eat 8 oz (170 g) of strawberries, you will have eaten 2 servings and 30 g of carbohydrates (2 servings x 15 g = 30 g).  For foods that have more than one food mixed, such as soups and casseroles, you must count the carbohydrates in each food that is included. The following list contains standard serving sizes of common carbohydrate-rich foods. Each of these servings has about 15 g of carbohydrates:   hamburger bun or  English muffin.   oz (15 mL) syrup.   oz (14 g) jelly.  1 slice of bread.  1 six-inch tortilla.  3 oz (85 g) cooked rice or pasta.  4 oz (113 g) cooked dried beans.  4 oz (113 g) starchy vegetable, such as peas, corn, or potatoes.  4 oz (113 g) hot cereal.  4 oz (113 g) mashed potatoes or  of a large baked potato.  4 oz (113 g) canned or frozen fruit.  4 oz (120 mL) fruit juice.  4-6 crackers.  6 chicken nuggets.  6 oz (170 g) unsweetened dry cereal.  6 oz (170 g) plain fat-free yogurt or yogurt sweetened with artificial sweeteners.  8 oz (240 mL) milk.  8 oz (170 g) fresh fruit or one small piece of fruit.  24 oz (680 g) popped popcorn. Example of carbohydrate counting Sample meal  3 oz (85 g) chicken breast.  6 oz (  170 g) brown rice.  4 oz (113 g) corn.  8 oz (240 mL) milk.  8 oz (170 g) strawberries with sugar-free whipped topping. Carbohydrate calculation 1. Identify the foods that contain carbohydrates:  Rice.  Corn.  Milk.  Strawberries. 2. Calculate how many servings you have of each food:  2 servings rice.  1 serving corn.  1 serving milk.  1 serving strawberries. 3. Multiply each number of servings by 15 g:  2 servings rice x 15 g = 30 g.  1 serving corn x 15 g = 15 g.  1 serving milk x 15 g = 15  g.  1 serving strawberries x 15 g = 15 g. 4. Add together all of the amounts to find the total grams of carbohydrates eaten:  30 g + 15 g + 15 g + 15 g = 75 g of carbohydrates total. This information is not intended to replace advice given to you by your health care provider. Make sure you discuss any questions you have with your health care provider. Document Released: 05/13/2005 Document Revised: 12/01/2015 Document Reviewed: 10/25/2015 Elsevier Interactive Patient Education  2017 Elsevier Inc.  

## 2016-07-12 NOTE — Progress Notes (Signed)
Subjective:    Patient ID: Emily Daniel, female    DOB: 17-Dec-1929, 83 y.o.   MRN: [id number]   I acted as a Neurosurgeon for Dr. Delman Kitten, LPN   Chief Complaint  Patient presents with  . Hypertension  . Diabetes  . Hyperlipidemia    Hypertension  This is a chronic problem. The current episode started more than 1 year ago. The problem is controlled. Pertinent negatives include no blurred vision, chest pain, headaches or palpitations.  Hyperlipidemia  This is a chronic problem. The current episode started more than 1 year ago. The problem is controlled. Pertinent negatives include no chest pain.  Diabetes  She presents for her follow-up diabetic visit. She has type 2 diabetes mellitus. No MedicAlert identification noted. Her disease course has been stable. Pertinent negatives for hypoglycemia include no headaches. Pertinent negatives for diabetes include no blurred vision and no chest pain.    Patient is in today for f/u cholesterol , bp and dm. HYPERTENSION   Blood pressure range-good per pt  Chest pain- no      Dyspnea- no Lightheadedness- no   Edema- good  Other side effects - no   Medication compliance: good Low salt diet- yes    DIABETES    Blood Sugar ranges-good per pt  Polyuria- no New Visual problems- no  Hypoglycemic symptoms- no  Other side effects-no Medication compliance - good Foot exam- today   HYPERLIPIDEMIA  Medication compliance- good RUQ pain- no  Muscle aches- no Other side effects-no   Past Medical History:  Diagnosis Date  . Anxiety   . Atrial fibrillation (HCC)    post op >>> req'd Amio and Coumadin  . AVM (arteriovenous malformation)   . CAD (coronary artery disease)    a. NSTEMI 10/2013 >>> LHC (10/2013):  Distal left main 90%, proximal LAD 30%, mid LAD 20%, ostial circumflex 99%, then 80%, mid RCA 99%, EF 30-35%, 3-4+ MR; proximal right iliac occluded, right renal 50%, ostial left iliac 70% >>> CABG/MV repair  . Cataract   . CHF  (congestive heart failure) (HCC)    Dr. Dorris Fetch following  . Chronic systolic heart failure (HCC)   . Diabetes mellitus, type 2 (HCC)   . Hemorrhoids   . Hx of echocardiogram    Echo (9/15):  Mild LVH, EF 40-45%, inf AK, Gr 1 DD, trivial AI, MV repair ok (mild MS, mild MR), mild LAE, PASP 32 mmHg.  [name] Hyperlipidemia   . Hypertension   . Ischemic cardiomyopathy    a. Echo (11/08/13):  EF 30-35%, diffuse hypokinesis, severe MR, mild LAE, PASP 48 mm Hg  . Mitral regurgitation    a. s/p MV repair at time of CABG  . Osteoporosis   . PVD (peripheral vascular disease) (HCC)   . Vitamin B12 deficiency     Past Surgical History:  Procedure Laterality Date  . ABDOMINAL HYSTERECTOMY     1969  . APPENDECTOMY     1945  . CORONARY ARTERY BYPASS GRAFT  1990, 1995, 1998  . HEMORRHOID SURGERY     1959  . LEFT AND RIGHT HEART CATHETERIZATION WITH CORONARY ANGIOGRAM N/A 11/10/2013   Procedure: LEFT AND RIGHT HEART CATHETERIZATION WITH CORONARY ANGIOGRAM;  Surgeon: Lennette Bihari, MD;  Location: [facility] CATH LAB;  Service: Cardiovascular;  Laterality: N/A;  . MITRAL VALVE REPLACEMENT (MVR)/CORONARY ARTERY BYPASS GRAFTING (CABG) N/A 11/11/2013   Procedure: MITRAL VALVE Ring repair (MVR)/CORONARY ARTERY BYPASS GRAFTING (CABG) times three using left internal mammary and  left sphenous vein.;  Surgeon: Loreli SlotSteven C Hendrickson, MD;  [facility];  Service: Open Heart Surgery;  Laterality: N/A;  Coronary artery bypass graft times three using left internal mammary artery and left saphenous leg vein using endoscope.  Exploration of right leg.     Family History  Problem Relation Age of Onset  . Cervical cancer Mother   . Cancer Brother     renal ...x3??  [name]. Hypertension Other   . Stroke Other   . Hypertension Sister   . Heart attack Neg Hx     Social History   Social History  . Marital status: Widowed    Spouse name: N/A  . Number of children: N/A  . Years of education: N/A   Occupational History    . retired Retired   Social History Main Topics  . Smoking status: Former Smoker    Packs/day: 1.00    Years: 36.00    Types: Cigarettes    Quit date: 02/18/1984  . Smokeless tobacco: Never Used  . Alcohol use No  . Drug use: No  . Sexual activity: Not Currently   Other Topics Concern  . Not on file   Social History Narrative   Exercise-- yard, house work    Outpatient Medications Prior to Visit  Medication Sig Dispense Refill  . aspirin 325 MG tablet Take 325 mg by mouth daily.    . Calcium Carbonate-Vitamin D (CALCIUM-VITAMIN D) 600-200 MG-UNIT CAPS Take 1 capsule by mouth 2 (two) times daily.     [name]. Casanthranol-Docusate Sodium 30-100 MG CAPS Take 1 capsule by mouth daily as needed (for constipation).     . ferrous sulfate 325 (65 FE) MG tablet Take 325 mg by mouth 2 (two) times daily with a meal.     . furosemide (LASIX) 20 MG tablet Take 1 tablet (20 mg total) by mouth daily. 90 tablet 3  . lisinopril (PRINIVIL,ZESTRIL) 20 MG tablet Take 1 tablet (20 mg total) by mouth daily. 90 tablet 1  . metoprolol (LOPRESSOR) 50 MG tablet Take 1 tablet (50 mg total) by mouth 2 (two) times daily. 180 tablet 3  . Multiple Vitamin (MULTIVITAMIN) tablet Take 1 tablet by mouth daily.      . vitamin B-12 (CYANOCOBALAMIN) 1000 MCG tablet Take 1,000 mcg by mouth daily.      . sitaGLIPtin (JANUVIA) 100 MG tablet Take 1 tablet (100 mg total) by mouth daily. 30 tablet 5   No facility-administered medications prior to visit.     Allergies  Allergen Reactions  . Meperidine Hcl Nausea And Vomiting  . Sulfonamide Derivatives Nausea And Vomiting  . Demerol [Meperidine]     Unknown per pt     Review of Systems  Constitutional: Negative for fever.  HENT: Negative for congestion.   Eyes: Negative for blurred vision.  Respiratory: Negative for cough.   Cardiovascular: Negative for chest pain and palpitations.  Gastrointestinal: Negative for vomiting.  Musculoskeletal: Negative for back pain.   Skin: Negative for rash.  Neurological: Negative for loss of consciousness and headaches.       Objective:    Physical Exam  Constitutional: She is oriented to person, place, and time. She appears well-developed and well-nourished. No distress.  HENT:  Head: Normocephalic and atraumatic.  Eyes: Conjunctivae are normal. Pupils are equal, round, and reactive to light.  Neck: Normal range of motion. No thyromegaly present.  Cardiovascular: Normal rate and regular rhythm.   Murmur heard. Pulmonary/Chest: Effort normal and breath sounds normal.  She has no wheezes.  Abdominal: Soft. Bowel sounds are normal. There is no tenderness.  Musculoskeletal: Normal range of motion. She exhibits no edema or deformity.  Neurological: She is alert and oriented to person, place, and time.  Skin: Skin is warm and dry. She is not diaphoretic.  Psychiatric: She has a normal mood and affect.  Sensory exam of the foot is normal, tested with the monofilament. Good pulses, no lesions or ulcers, good peripheral pulses.  BP (!) 180/70 (BP Location: Right Arm, Patient Position: Sitting, Cuff Size: Normal)   Pulse 63   Temp 98.4 F (36.9 C) (Oral)   Resp 16   Ht 5' (1.524 m)   Wt 126 lb (57.2 kg)   SpO2 96%   BMI 24.61 kg/m  Wt Readings from Last 3 Encounters:  07/12/16 126 lb (57.2 kg)  04/17/16 129 lb 6.4 oz (58.7 kg)  03/21/16 126 lb 9.6 oz (57.4 kg)     Lab Results  Component Value Date   WBC 6.6 07/12/2016   HGB 10.4 (L) 07/12/2016   HCT 30.4 (L) 07/12/2016   PLT 221.0 07/12/2016   GLUCOSE 107 (H) 07/12/2016   CHOL 199 07/12/2016   TRIG 119.0 07/12/2016   HDL 47.40 07/12/2016   LDLDIRECT 156.0 03/13/2009   LDLCALC 127 (H) 07/12/2016   ALT 11 07/12/2016   AST 11 07/12/2016   NA 143 07/12/2016   K 3.8 07/12/2016   CL 109 07/12/2016   CREATININE 1.28 (H) 07/12/2016   BUN 22 07/12/2016   CO2 26 07/12/2016   TSH 0.942 11/17/2013   INR 2.5 05/02/2014   HGBA1C 5.6 07/12/2016    MICROALBUR 1.6 10/25/2014    Lab Results  Component Value Date   TSH 0.942 11/17/2013   Lab Results  Component Value Date   WBC 6.6 07/12/2016   HGB 10.4 (L) 07/12/2016   HCT 30.4 (L) 07/12/2016   MCV 99.5 07/12/2016   PLT 221.0 07/12/2016   Lab Results  Component Value Date   NA 143 07/12/2016   K 3.8 07/12/2016   CO2 26 07/12/2016   GLUCOSE 107 (H) 07/12/2016   BUN 22 07/12/2016   CREATININE 1.28 (H) 07/12/2016   BILITOT 0.4 07/12/2016   ALKPHOS 38 (L) 07/12/2016   AST 11 07/12/2016   ALT 11 07/12/2016   PROT 6.9 07/12/2016   ALBUMIN 4.1 07/12/2016   CALCIUM 9.3 07/12/2016   GFR 42.17 (L) 07/12/2016   Lab Results  Component Value Date   CHOL 199 07/12/2016   Lab Results  Component Value Date   HDL 47.40 07/12/2016   Lab Results  Component Value Date   LDLCALC 127 (H) 07/12/2016   Lab Results  Component Value Date   TRIG 119.0 07/12/2016   Lab Results  Component Value Date   CHOLHDL 4 07/12/2016   Lab Results  Component Value Date   HGBA1C 5.6 07/12/2016       Assessment & Plan:   Problem List Items Addressed This Visit      Unprioritized   Type 2 diabetes mellitus with circulatory disorder (HCC) - Primary   Relevant Medications   sitaGLIPtin (JANUVIA) 100 MG tablet   Other Relevant Orders   Hemoglobin A1c (Completed)   Atrial fibrillation [facility])    Per cardiology      Essential hypertension    Elevated today Pt states runs better at home--  Will need to see home readings Watch salt in diet con't meds If home readings the same ---  Inc lisinopril 40 mg daily      Relevant Orders   CBC (Completed)   Comprehensive metabolic panel (Completed)   Hyperlipidemia    Check labs con't meds      Relevant Orders   Lipid panel (Completed)    Other Visit Diagnoses    DM (diabetes mellitus) type II uncontrolled, periph vascular disorder (HCC)       Relevant Medications   sitaGLIPtin (JANUVIA) 100 MG tablet      I am having Ms.  Rohrer maintain her Calcium-Vitamin D, multivitamin, vitamin B-12, Casanthranol-Docusate Sodium, aspirin, metoprolol, furosemide, lisinopril, ferrous sulfate, and sitaGLIPtin.  Meds ordered this encounter  Medications  . sitaGLIPtin (JANUVIA) 100 MG tablet    Sig: Take 1 tablet (100 mg total) by mouth daily.    Dispense:  30 tablet    Refill:  5    CMA served as scribe during this visit. History, Physical and Plan performed by medical provider. Documentation and orders reviewed and attested to.  Donato Schultz, DO  Patient ID: Emily Daniel, female   DOB: 27-Feb-1930, 83 y.o.   MRN: [id number]

## 2016-07-12 NOTE — Progress Notes (Signed)
Pre visit review using our clinic review tool, if applicable. No additional management support is needed unless otherwise documented below in the visit note. 

## 2016-07-14 MED ORDER — SITAGLIPTIN PHOSPHATE 100 MG PO TABS: 100.0000 mg | ORAL_TABLET | Freq: Every day | ORAL | 5 refills | Status: DC

## 2016-07-14 NOTE — Assessment & Plan Note (Signed)
Check labs con't meds 

## 2016-07-14 NOTE — Assessment & Plan Note (Signed)
Per cardiology 

## 2016-07-14 NOTE — Assessment & Plan Note (Signed)
Elevated today Pt states runs better at home--  Will need to see home readings Watch salt in diet con't meds If home readings the same ---  Inc lisinopril 40 mg daily

## 2016-07-16 ENCOUNTER — Ambulatory Visit (INDEPENDENT_AMBULATORY_CARE_PROVIDER_SITE_OTHER): Payer: Medicare Other | Admitting: Cardiovascular Disease

## 2016-07-16 VITALS — BP 172/76 | HR 76 | Ht 60.0 in | Wt 127.0 lb

## 2016-07-16 DIAGNOSIS — I739 Peripheral vascular disease, unspecified: Secondary | ICD-10-CM

## 2016-07-16 DIAGNOSIS — I10 Essential (primary) hypertension: Secondary | ICD-10-CM | POA: Diagnosis not present

## 2016-07-16 MED ORDER — LISINOPRIL 40 MG PO TABS: 40.0000 mg | ORAL_TABLET | Freq: Every day | ORAL | 3 refills | Status: DC

## 2016-07-16 NOTE — Progress Notes (Signed)
Cardiology Office Note   Date:  07/16/2016   ID:  Emily Daniel, DOB 1929-09-16, MRN [id number]  PCP:  Donato Schultz, DO  Cardiologist:  Dr. Eden Emms  No chief complaint on file.     History of Present Illness: Emily Daniel is a 83 y.o. female who presents for a follow-up visit regarding peripheral arterial disease. She has known history of CAD status post CABG and mitral valve repair in 2015, HTN, HL, diabetes, PAD, AVMs, prior tobacco abuse.  She is known to have peripheral arterial disease with ABI of 0.4 on the right and normal on the left. This is due to inflow disease with occluded right common iliac artery and significant left common iliac artery stenosis.  She was treated medically given that her claudication was not lifestyle limiting. She reports that her symptoms are stable. She develops bilateral leg pain which is equal on both sides after walking about 50-100 feet. This has not changed over the last 2 years.  Past Medical History:  Diagnosis Date  . Anxiety   . Atrial fibrillation (HCC)    post op >>> req'd Amio and Coumadin  . AVM (arteriovenous malformation)   . CAD (coronary artery disease)    a. NSTEMI 10/2013 >>> LHC (10/2013):  Distal left main 90%, proximal LAD 30%, mid LAD 20%, ostial circumflex 99%, then 80%, mid RCA 99%, EF 30-35%, 3-4+ MR; proximal right iliac occluded, right renal 50%, ostial left iliac 70% >>> CABG/MV repair  . Cataract   . CHF (congestive heart failure) (HCC)    Dr. Dorris Fetch following  . Chronic systolic heart failure (HCC)   . Diabetes mellitus, type 2 (HCC)   . Hemorrhoids   . Hx of echocardiogram    Echo (9/15):  Mild LVH, EF 40-45%, inf AK, Gr 1 DD, trivial AI, MV repair ok (mild MS, mild MR), mild LAE, PASP 32 mmHg.  [name] Hyperlipidemia   . Hypertension   . Ischemic cardiomyopathy    a. Echo (11/08/13):  EF 30-35%, diffuse hypokinesis, severe MR, mild LAE, PASP 48 mm Hg  . Mitral regurgitation    a. s/p MV repair at  time of CABG  . Osteoporosis   . PVD (peripheral vascular disease) (HCC)   . Vitamin B12 deficiency     Past Surgical History:  Procedure Laterality Date  . ABDOMINAL HYSTERECTOMY     1969  . APPENDECTOMY     1945  . CORONARY ARTERY BYPASS GRAFT  1990, 1995, 1998  . HEMORRHOID SURGERY     1959  . LEFT AND RIGHT HEART CATHETERIZATION WITH CORONARY ANGIOGRAM N/A 11/10/2013   Procedure: LEFT AND RIGHT HEART CATHETERIZATION WITH CORONARY ANGIOGRAM;  Surgeon: Lennette Bihari, MD;  Location: [facility] CATH LAB;  Service: Cardiovascular;  Laterality: N/A;  . MITRAL VALVE REPLACEMENT (MVR)/CORONARY ARTERY BYPASS GRAFTING (CABG) N/A 11/11/2013   Procedure: MITRAL VALVE Ring repair (MVR)/CORONARY ARTERY BYPASS GRAFTING (CABG) times three using left internal mammary and left sphenous vein.;  Surgeon: Loreli Slot, MD;  Location: [facility] OR;  Service: Open Heart Surgery;  Laterality: N/A;  Coronary artery bypass graft times three using left internal mammary artery and left saphenous leg vein using endoscope.  Exploration of right leg.      Current Outpatient Prescriptions  Medication Sig Dispense Refill  . aspirin 325 MG tablet Take 325 mg by mouth daily.    . Calcium Carbonate-Vitamin D (CALCIUM-VITAMIN D) 600-200 MG-UNIT CAPS Take 1 capsule by mouth 2 (two)  times daily.     [name]. Casanthranol-Docusate Sodium 30-100 MG CAPS Take 1 capsule by mouth daily as needed (for constipation).     . ferrous sulfate 325 (65 FE) MG tablet Take 325 mg by mouth 2 (two) times daily with a meal.     . furosemide (LASIX) 20 MG tablet Take 1 tablet (20 mg total) by mouth daily. 90 tablet 3  . lisinopril (PRINIVIL,ZESTRIL) 40 MG tablet Take 1 tablet (40 mg total) by mouth daily. 90 tablet 3  . metoprolol (LOPRESSOR) 50 MG tablet Take 1 tablet (50 mg total) by mouth 2 (two) times daily. 180 tablet 3  . Multiple Vitamin (MULTIVITAMIN) tablet Take 1 tablet by mouth daily.      . sitaGLIPtin (JANUVIA) 100 MG tablet Take 1 tablet  (100 mg total) by mouth daily. 30 tablet 5  . vitamin B-12 (CYANOCOBALAMIN) 1000 MCG tablet Take 1,000 mcg by mouth daily.       No current facility-administered medications for this visit.     Allergies:   Meperidine hcl; Sulfonamide derivatives; and Demerol [meperidine]    Social History:  The patient  reports that she quit smoking about 32 years ago. Her smoking use included Cigarettes. She has a 36.00 pack-year smoking history. She has never used smokeless tobacco. She reports that she does not drink alcohol or use drugs.   Family History:  The patient's family history includes Cancer in her brother; Cervical cancer in her mother; Hypertension in her other and sister; Stroke in her other.    ROS:  Please see the history of present illness.   Otherwise, review of systems are positive for none.   All other systems are reviewed and negative.    PHYSICAL EXAM: VS:  BP (!) 172/76   Pulse 76   Ht 5' (1.524 m)   Wt 127 lb (57.6 kg)   SpO2 97%   BMI 24.80 kg/m  , BMI Body mass index is 24.8 kg/m. GEN: Well nourished, well developed, in no acute distress  HEENT: normal  Neck: no JVD, carotid bruits, or masses Cardiac: RRR; no rubs, or gallops,no edema . 2/6 systolic ejection murmur in the aortic area Respiratory:  clear to auscultation bilaterally, normal work of breathing GI: soft, nontender, nondistended, + BS MS: no deformity or atrophy  Skin: warm and dry, no rash Neuro:  Strength and sensation are intact Psych: euthymic mood, full affect Vascular: Femoral pulses +1 on the right and +2 on the left. Distal pulses are not palpable   EKG:  EKG is not ordered today.    Recent Labs: 07/12/2016: ALT 11; BUN 22; Creatinine, Ser 1.28; Hemoglobin 10.4; Platelets 221.0; Potassium 3.8; Sodium 143    Lipid Panel    Component Value Date/Time   CHOL 199 07/12/2016 0958   TRIG 119.0 07/12/2016 0958   HDL 47.40 07/12/2016 0958   CHOLHDL 4 07/12/2016 0958   VLDL 23.8 07/12/2016  0958   LDLCALC 127 (H) 07/12/2016 0958   LDLDIRECT 156.0 03/13/2009 0000      Wt Readings from Last 3 Encounters:  07/16/16 127 lb (57.6 kg)  07/12/16 126 lb (57.2 kg)  04/17/16 129 lb 6.4 oz (58.7 kg)      No flowsheet data found.    ASSESSMENT AND PLAN:  1.  Peripheral arterial disease: Known inflow disease worse on the right side. The patient has moderate to severe claudication but she reports that her symptoms have been stable over the last 2 years. She is limited  by other comorbidities. Considering her age, I recommend continuing medical therapy and reserving revascularization for worsening symptoms.  2. Essential hypertension: Blood pressure has been elevated over the last year. I increased lisinopril to 40 mg once daily.    Disposition:   FU with me in 1 year  Signed,  Lorine Bears, MD  07/16/2016 12:35 PM    [facility]

## 2016-07-16 NOTE — Patient Instructions (Signed)
Medication Instructions:  Your physician has recommended you make the following change in your medication:  1. INCREASE Lisinopril to 40mg  take one tablet by mouth daily  Labwork: No new orders.   Testing/Procedures: No new orders.   Follow-Up: Your physician wants you to follow-up in: 1 YEAR with Dr Kirke CorinArida. You will receive a reminder letter in the mail two months in advance. If you don't receive a letter, please call our office to schedule the follow-up appointment.   Any Other Special Instructions Will Be Listed Below (If Applicable).     If you need a refill on your cardiac medications before your next appointment, please call your pharmacy.

## 2016-07-21 ENCOUNTER — Other Ambulatory Visit: Payer: Self-pay | Admitting: Cardiovascular Disease

## 2016-07-22 ENCOUNTER — Other Ambulatory Visit: Payer: Self-pay | Admitting: Cardiovascular Disease

## 2016-07-22 DIAGNOSIS — I10 Essential (primary) hypertension: Secondary | ICD-10-CM

## 2016-07-22 MED ORDER — METOPROLOL TARTRATE 50 MG PO TABS: 50.0000 mg | ORAL_TABLET | Freq: Two times a day (BID) | ORAL | 3 refills | Status: DC

## 2016-08-15 ENCOUNTER — Other Ambulatory Visit: Payer: Self-pay | Admitting: Family Medicine

## 2016-08-15 DIAGNOSIS — IMO0002 Reserved for concepts with insufficient information to code with codable children: Secondary | ICD-10-CM

## 2016-08-15 DIAGNOSIS — E1165 Type 2 diabetes mellitus with hyperglycemia: Principal | ICD-10-CM

## 2016-08-15 DIAGNOSIS — E1151 Type 2 diabetes mellitus with diabetic peripheral angiopathy without gangrene: Secondary | ICD-10-CM

## 2016-09-26 ENCOUNTER — Ambulatory Visit: Payer: Self-pay | Admitting: Family Medicine

## 2016-09-26 ENCOUNTER — Ambulatory Visit (INDEPENDENT_AMBULATORY_CARE_PROVIDER_SITE_OTHER): Payer: Medicare Other | Admitting: Family Medicine

## 2016-09-26 ENCOUNTER — Encounter: Payer: Self-pay | Admitting: Family Medicine

## 2016-09-26 VITALS — BP 174/59 | HR 56 | Temp 98.4°F | Resp 16 | Ht 60.0 in | Wt 124.2 lb

## 2016-09-26 DIAGNOSIS — E785 Hyperlipidemia, unspecified: Secondary | ICD-10-CM | POA: Diagnosis not present

## 2016-09-26 DIAGNOSIS — E1165 Type 2 diabetes mellitus with hyperglycemia: Secondary | ICD-10-CM

## 2016-09-26 DIAGNOSIS — E1151 Type 2 diabetes mellitus with diabetic peripheral angiopathy without gangrene: Secondary | ICD-10-CM | POA: Diagnosis not present

## 2016-09-26 DIAGNOSIS — D509 Iron deficiency anemia, unspecified: Secondary | ICD-10-CM

## 2016-09-26 DIAGNOSIS — I10 Essential (primary) hypertension: Secondary | ICD-10-CM

## 2016-09-26 DIAGNOSIS — IMO0002 Reserved for concepts with insufficient information to code with codable children: Secondary | ICD-10-CM

## 2016-09-26 DIAGNOSIS — E1159 Type 2 diabetes mellitus with other circulatory complications: Secondary | ICD-10-CM | POA: Diagnosis not present

## 2016-09-26 LAB — COMPREHENSIVE METABOLIC PANEL
ALBUMIN: 4.1 g/dL (ref 3.5–5.2)
ALT: 11 U/L (ref 0–35)
AST: 11 U/L (ref 0–37)
Alkaline Phosphatase: 36 U/L — ABNORMAL LOW (ref 39–117)
BUN: 21 mg/dL (ref 6–23)
CALCIUM: 9.5 mg/dL (ref 8.4–10.5)
CHLORIDE: 109 meq/L (ref 96–112)
CO2: 27 mEq/L (ref 19–32)
CREATININE: 1.21 mg/dL — AB (ref 0.40–1.20)
GFR: 44.98 mL/min — AB (ref >60.00)
Glucose, Bld: 113 mg/dL — ABNORMAL HIGH (ref 70–99)
POTASSIUM: 4.3 meq/L (ref 3.5–5.1)
Sodium: 142 mEq/L (ref 135–145)
Total Bilirubin: 0.4 mg/dL (ref 0.2–1.2)
Total Protein: 6.7 g/dL (ref 6.0–8.3)

## 2016-09-26 LAB — CBC WITH DIFFERENTIAL/PLATELET
BASOS ABS: 0.1 10*3/uL (ref 0.0–0.1)
Basophils Relative: 1.2 % (ref 0.0–3.0)
Eosinophils Absolute: 0.3 10*3/uL (ref 0.0–0.7)
Eosinophils Relative: 5.6 % — ABNORMAL HIGH (ref 0.0–5.0)
HCT: 30.8 % — ABNORMAL LOW (ref 36.0–46.0)
Hemoglobin: 10.2 g/dL — ABNORMAL LOW (ref 12.0–15.0)
LYMPHS ABS: 1.8 10*3/uL (ref 0.7–4.0)
Lymphocytes Relative: 30.4 % (ref 12.0–46.0)
MCHC: 33.2 g/dL (ref 30.0–36.0)
MCV: 103.3 fl — AB (ref 78.0–100.0)
MONO ABS: 1 10*3/uL (ref 0.1–1.0)
MONOS PCT: 16.9 % — AB (ref 3.0–12.0)
NEUTROS ABS: 2.8 10*3/uL (ref 1.4–7.7)
NEUTROS PCT: 45.9 % (ref 43.0–77.0)
PLATELETS: 213 10*3/uL (ref 150.0–400.0)
RBC: 2.98 Mil/uL — ABNORMAL LOW (ref 3.87–5.11)
RDW: 14.6 % (ref 11.5–15.5)
WBC: 6 10*3/uL (ref 4.0–10.5)

## 2016-09-26 LAB — LIPID PANEL
CHOLESTEROL: 191 mg/dL (ref 0–200)
HDL: 47.4 mg/dL (ref >39.00)
LDL CALC: 121 mg/dL — AB (ref 0–99)
NonHDL: 143.49
Total CHOL/HDL Ratio: 4
Triglycerides: 111 mg/dL (ref 0.0–149.0)
VLDL: 22.2 mg/dL (ref 0.0–40.0)

## 2016-09-26 LAB — HEMOGLOBIN A1C: Hgb A1c MFr Bld: 5.3 % (ref 4.6–6.5)

## 2016-09-26 MED ORDER — FUROSEMIDE 20 MG PO TABS: 20.0000 mg | ORAL_TABLET | Freq: Every day | ORAL | 3 refills | Status: DC

## 2016-09-26 MED ORDER — METOPROLOL TARTRATE 50 MG PO TABS: 50.0000 mg | ORAL_TABLET | Freq: Two times a day (BID) | ORAL | 3 refills | Status: DC

## 2016-09-26 MED ORDER — LISINOPRIL 40 MG PO TABS: 40.0000 mg | ORAL_TABLET | Freq: Every day | ORAL | 3 refills | Status: DC

## 2016-09-26 MED ORDER — SITAGLIPTIN PHOSPHATE 100 MG PO TABS: 100.0000 mg | ORAL_TABLET | Freq: Every day | ORAL | 5 refills | Status: DC

## 2016-09-26 NOTE — Progress Notes (Signed)
Pre visit review using our clinic review tool, if applicable. No additional management support is needed unless otherwise documented below in the visit note. 

## 2016-09-26 NOTE — Assessment & Plan Note (Signed)
Encouraged heart healthy diet, increase exercise, avoid trans fats, consider a krill oil cap daily 

## 2016-09-26 NOTE — Assessment & Plan Note (Signed)
hgba1c acceptable, minimize simple carbs. Increase exercise as tolerated. Continue current meds 

## 2016-09-26 NOTE — Assessment & Plan Note (Signed)
bp running slight

## 2016-09-26 NOTE — Patient Instructions (Signed)
Carbohydrate Counting for Diabetes Mellitus, Adult Carbohydrate counting is a method for keeping track of how many carbohydrates you eat. Eating carbohydrates naturally increases the amount of sugar (glucose) in the blood. Counting how many carbohydrates you eat helps keep your blood glucose within normal limits, which helps you manage your diabetes (diabetes mellitus). It is important to know how many carbohydrates you can safely have in each meal. This is different for every person. A diet and nutrition specialist (registered dietitian) can help you make a meal plan and calculate how many carbohydrates you should have at each meal and snack. Carbohydrates are found in the following foods:  Grains, such as breads and cereals.  Dried beans and soy products.  Starchy vegetables, such as potatoes, peas, and corn.  Fruit and fruit juices.  Milk and yogurt.  Sweets and snack foods, such as cake, cookies, candy, chips, and soft drinks. How do I count carbohydrates? There are two ways to count carbohydrates in food. You can use either of the methods or a combination of both. Reading "Nutrition Facts" on packaged food  The "Nutrition Facts" list is included on the labels of almost all packaged foods and beverages in the [place] It includes:  The serving size.  Information about nutrients in each serving, including the grams (g) of carbohydrate per serving. To use the "Nutrition Facts":  Decide how many servings you will have.  Multiply the number of servings by the number of carbohydrates per serving.  The resulting number is the total amount of carbohydrates that you will be having. Learning standard serving sizes of other foods  When you eat foods containing carbohydrates that are not packaged or do not include "Nutrition Facts" on the label, you need to measure the servings in order to count the amount of carbohydrates:  Measure the foods that you will eat with a food scale or measuring  cup, if needed.  Decide how many standard-size servings you will eat.  Multiply the number of servings by 15. Most carbohydrate-rich foods have about 15 g of carbohydrates per serving.  For example, if you eat 8 oz (170 g) of strawberries, you will have eaten 2 servings and 30 g of carbohydrates (2 servings x 15 g = 30 g).  For foods that have more than one food mixed, such as soups and casseroles, you must count the carbohydrates in each food that is included. The following list contains standard serving sizes of common carbohydrate-rich foods. Each of these servings has about 15 g of carbohydrates:   hamburger bun or  English muffin.   oz (15 mL) syrup.   oz (14 g) jelly.  1 slice of bread.  1 six-inch tortilla.  3 oz (85 g) cooked rice or pasta.  4 oz (113 g) cooked dried beans.  4 oz (113 g) starchy vegetable, such as peas, corn, or potatoes.  4 oz (113 g) hot cereal.  4 oz (113 g) mashed potatoes or  of a large baked potato.  4 oz (113 g) canned or frozen fruit.  4 oz (120 mL) fruit juice.  4-6 crackers.  6 chicken nuggets.  6 oz (170 g) unsweetened dry cereal.  6 oz (170 g) plain fat-free yogurt or yogurt sweetened with artificial sweeteners.  8 oz (240 mL) milk.  8 oz (170 g) fresh fruit or one small piece of fruit.  24 oz (680 g) popped popcorn. Example of carbohydrate counting Sample meal  3 oz (85 g) chicken breast.  6 oz (  170 g) brown rice.  4 oz (113 g) corn.  8 oz (240 mL) milk.  8 oz (170 g) strawberries with sugar-free whipped topping. Carbohydrate calculation 1. Identify the foods that contain carbohydrates:  Rice.  Corn.  Milk.  Strawberries. 2. Calculate how many servings you have of each food:  2 servings rice.  1 serving corn.  1 serving milk.  1 serving strawberries. 3. Multiply each number of servings by 15 g:  2 servings rice x 15 g = 30 g.  1 serving corn x 15 g = 15 g.  1 serving milk x 15 g = 15  g.  1 serving strawberries x 15 g = 15 g. 4. Add together all of the amounts to find the total grams of carbohydrates eaten:  30 g + 15 g + 15 g + 15 g = 75 g of carbohydrates total. This information is not intended to replace advice given to you by your health care provider. Make sure you discuss any questions you have with your health care provider. Document Released: 05/13/2005 Document Revised: 12/01/2015 Document Reviewed: 10/25/2015 Elsevier Interactive Patient Education  2017 Elsevier Inc.  

## 2016-09-26 NOTE — Progress Notes (Signed)
Patient ID: Emily Daniel, female   DOB: [birth year], 83 y.o.   MRN: [id number]     Subjective:    Patient ID: Emily MilchMary R Daniel, female    DOB: [birth year], 83 y.o.   MRN: [id number]  Chief Complaint  Patient presents with  . Hypertension  . Hyperlipidemia  . Diabetes    HPI Patient is in today for follow up on blood pressure, cholesterol, and diabetes.  She did not want to start pravachol until she talks with provider.  She has been getting high readings for blood pressure at home.  Sugars have been running 90s-110.  HPI HYPERTENSION   Blood pressure range-not checking   Chest pain- no      Dyspnea- no Lightheadedness- no   Edema- no  Other side effects - no   Medication compliance: good Low salt diet- yes    DIABETES    Blood Sugar ranges-90-110  Polyuria- no New Visual problems- no  Hypoglycemic symptoms- no  Other side effects-no Medication compliance - good Last eye exam- due Foot exam- today   HYPERLIPIDEMIA  Medication compliance- unable to tol statin-- she will discuss with cardiology RUQ pain-no  Muscle aches- no Other side effects-no        Past Medical History:  Diagnosis Date  . Anxiety   . Atrial fibrillation (HCC)    post op >>> req'd Amio and Coumadin  . AVM (arteriovenous malformation)   . CAD (coronary artery disease)    a. NSTEMI 10/2013 >>> LHC (10/2013):  Distal left main 90%, proximal LAD 30%, mid LAD 20%, ostial circumflex 99%, then 80%, mid RCA 99%, EF 30-35%, 3-4+ MR; proximal right iliac occluded, right renal 50%, ostial left iliac 70% >>> CABG/MV repair  . Cataract   . CHF (congestive heart failure) (HCC)    Dr. Dorris FetchHendrickson following  . Chronic systolic heart failure (HCC)   . Diabetes mellitus, type 2 (HCC)   . Hemorrhoids   . Hx of echocardiogram    Echo (9/15):  Mild LVH, EF 40-45%, inf AK, Gr 1 DD, trivial AI, MV repair ok (mild MS, mild MR), mild LAE, PASP 32 mmHg.  [name]. Hyperlipidemia   . Hypertension   . Ischemic cardiomyopathy     a. Echo (11/08/13):  EF 30-35%, diffuse hypokinesis, severe MR, mild LAE, PASP 48 mm Hg  . Mitral regurgitation    a. s/p MV repair at time of CABG  . Osteoporosis   . PVD (peripheral vascular disease) (HCC)   . Vitamin B12 deficiency     Past Surgical History:  Procedure Laterality Date  . ABDOMINAL HYSTERECTOMY     1969  . APPENDECTOMY     1945  . CORONARY ARTERY BYPASS GRAFT  1990, 1995, 1998  . HEMORRHOID SURGERY     1959  . LEFT AND RIGHT HEART CATHETERIZATION WITH CORONARY ANGIOGRAM N/A 11/10/2013   Procedure: LEFT AND RIGHT HEART CATHETERIZATION WITH CORONARY ANGIOGRAM;  Surgeon: Lennette Biharihomas A Kelly, MD;  [facility];  Service: Cardiovascular;  Laterality: N/A;  . MITRAL VALVE REPLACEMENT (MVR)/CORONARY ARTERY BYPASS GRAFTING (CABG) N/A 11/11/2013   Procedure: MITRAL VALVE Ring repair (MVR)/CORONARY ARTERY BYPASS GRAFTING (CABG) times three using left internal mammary and left sphenous vein.;  Surgeon: Loreli SlotSteven C Hendrickson, MD;  Location: [facility] OR;  Service: Open Heart Surgery;  Laterality: N/A;  Coronary artery bypass graft times three using left internal mammary artery and left saphenous leg vein using endoscope.  Exploration of right leg.     Family  History  Problem Relation Age of Onset  . Cervical cancer Mother   . Cancer Brother     renal ...x3??  [name] Hypertension Other   . Stroke Other   . Hypertension Sister   . Heart attack Neg Hx     Social History   Social History  . Marital status: Widowed    Spouse name: N/A  . Number of children: N/A  . Years of education: N/A   Occupational History  . retired Retired   Social History Main Topics  . Smoking status: Former Smoker    Packs/day: 1.00    Years: 36.00    Types: Cigarettes    Quit date: 02/18/1984  . Smokeless tobacco: Never Used  . Alcohol use No  . Drug use: No  . Sexual activity: Not Currently   Other Topics Concern  . Not on file   Social History Narrative   Exercise-- yard, house work     Outpatient Medications Prior to Visit  Medication Sig Dispense Refill  . aspirin 325 MG tablet Take 325 mg by mouth daily.    . Calcium Carbonate-Vitamin D (CALCIUM-VITAMIN D) 600-200 MG-UNIT CAPS Take 1 capsule by mouth 2 (two) times daily.     [name] Sodium 30-100 MG CAPS Take 1 capsule by mouth daily as needed (for constipation).     . ferrous sulfate 325 (65 FE) MG tablet Take 325 mg by mouth 2 (two) times daily with a meal.     . Multiple Vitamin (MULTIVITAMIN) tablet Take 1 tablet by mouth daily.      . sitaGLIPtin (JANUVIA) 100 MG tablet Take 1 tablet (100 mg total) by mouth daily. 30 tablet 5  . vitamin B-12 (CYANOCOBALAMIN) 1000 MCG tablet Take 1,000 mcg by mouth daily.      . furosemide (LASIX) 20 MG tablet Take 1 tablet (20 mg total) by mouth daily. 90 tablet 3  . JANUVIA 100 MG tablet TAKE ONE TABLET BY MOUTH ONCE DAILY 30 tablet 5  . lisinopril (PRINIVIL,ZESTRIL) 40 MG tablet Take 1 tablet (40 mg total) by mouth daily. 90 tablet 3  . metoprolol (LOPRESSOR) 50 MG tablet Take 1 tablet (50 mg total) by mouth 2 (two) times daily. 180 tablet 3   No facility-administered medications prior to visit.     Allergies  Allergen Reactions  . Meperidine Hcl Nausea And Vomiting  . Sulfonamide Derivatives Nausea And Vomiting  . Demerol [Meperidine]     Unknown per pt     Review of Systems  Constitutional: Negative for fever and malaise/fatigue.  HENT: Negative for congestion.   Eyes: Negative for blurred vision.  Respiratory: Negative for cough and shortness of breath.   Cardiovascular: Negative for chest pain, palpitations and leg swelling.  Gastrointestinal: Negative for vomiting.  Musculoskeletal: Negative for back pain.  Skin: Negative for rash.  Neurological: Negative for loss of consciousness and headaches.       Objective:    Physical Exam  Constitutional: She is oriented to person, place, and time. She appears well-developed and well-nourished. No  distress.  HENT:  Head: Normocephalic and atraumatic.  Eyes: Conjunctivae are normal.  Neck: Normal range of motion. No thyromegaly present.  Cardiovascular: Normal rate and regular rhythm.   Pulmonary/Chest: Effort normal and breath sounds normal. She has no wheezes.  Abdominal: Soft. Bowel sounds are normal. There is no tenderness.  Musculoskeletal: Normal range of motion. She exhibits no edema or deformity.  Neurological: She is alert and oriented to person, place,  and time.  Skin: Skin is warm and dry. She is not diaphoretic.  Psychiatric: She has a normal mood and affect. Her behavior is normal. Judgment and thought content normal.  Sensory exam of the foot is normal, tested with the monofilament. Good pulses, no lesions or ulcers, good peripheral pulses.  BP (!) 154/69 (BP Location: Left Arm, Cuff Size: Normal)   Pulse 77   Temp 98.4 F (36.9 C) (Oral)   Resp 16   Ht 5' (1.524 m)   Wt 124 lb 3.2 oz (56.3 kg)   SpO2 100%   BMI 24.26 kg/m  Wt Readings from Last 3 Encounters:  09/26/16 124 lb 3.2 oz (56.3 kg)  07/16/16 127 lb (57.6 kg)  07/12/16 126 lb (57.2 kg)     Lab Results  Component Value Date   WBC 6.6 07/12/2016   HGB 10.4 (L) 07/12/2016   HCT 30.4 (L) 07/12/2016   PLT 221.0 07/12/2016   GLUCOSE 107 (H) 07/12/2016   CHOL 199 07/12/2016   TRIG 119.0 07/12/2016   HDL 47.40 07/12/2016   LDLDIRECT 156.0 03/13/2009   LDLCALC 127 (H) 07/12/2016   ALT 11 07/12/2016   AST 11 07/12/2016   NA 143 07/12/2016   K 3.8 07/12/2016   CL 109 07/12/2016   CREATININE 1.28 (H) 07/12/2016   BUN 22 07/12/2016   CO2 26 07/12/2016   TSH 0.942 11/17/2013   INR 2.5 05/02/2014   HGBA1C 5.6 07/12/2016   MICROALBUR 1.6 10/25/2014    Lab Results  Component Value Date   TSH 0.942 11/17/2013   Lab Results  Component Value Date   WBC 6.6 07/12/2016   HGB 10.4 (L) 07/12/2016   HCT 30.4 (L) 07/12/2016   MCV 99.5 07/12/2016   PLT 221.0 07/12/2016   Lab Results  Component  Value Date   NA 143 07/12/2016   K 3.8 07/12/2016   CO2 26 07/12/2016   GLUCOSE 107 (H) 07/12/2016   BUN 22 07/12/2016   CREATININE 1.28 (H) 07/12/2016   BILITOT 0.4 07/12/2016   ALKPHOS 38 (L) 07/12/2016   AST 11 07/12/2016   ALT 11 07/12/2016   PROT 6.9 07/12/2016   ALBUMIN 4.1 07/12/2016   CALCIUM 9.3 07/12/2016   GFR 42.17 (L) 07/12/2016   Lab Results  Component Value Date   CHOL 199 07/12/2016   Lab Results  Component Value Date   HDL 47.40 07/12/2016   Lab Results  Component Value Date   LDLCALC 127 (H) 07/12/2016   Lab Results  Component Value Date   TRIG 119.0 07/12/2016   Lab Results  Component Value Date   CHOLHDL 4 07/12/2016   Lab Results  Component Value Date   HGBA1C 5.6 07/12/2016       Assessment & Plan:   Problem List Items Addressed This Visit      Unprioritized   Iron deficiency anemia   Relevant Orders   CBC with Differential/Platelet   Type 2 diabetes mellitus with circulatory disorder (HCC)   Relevant Medications   sitaGLIPtin (JANUVIA) 100 MG tablet   furosemide (LASIX) 20 MG tablet   lisinopril (PRINIVIL,ZESTRIL) 40 MG tablet   metoprolol (LOPRESSOR) 50 MG tablet   Other Relevant Orders   Hemoglobin A1c   Essential hypertension   Relevant Medications   furosemide (LASIX) 20 MG tablet   lisinopril (PRINIVIL,ZESTRIL) 40 MG tablet   metoprolol (LOPRESSOR) 50 MG tablet   Other Relevant Orders   CBC with Differential/Platelet   Hyperlipidemia - Primary  Relevant Medications   furosemide (LASIX) 20 MG tablet   lisinopril (PRINIVIL,ZESTRIL) 40 MG tablet   metoprolol (LOPRESSOR) 50 MG tablet   Other Relevant Orders   Comprehensive metabolic panel   Lipid panel    Other Visit Diagnoses    DM (diabetes mellitus) type II uncontrolled, periph vascular disorder (HCC)       Relevant Medications   sitaGLIPtin (JANUVIA) 100 MG tablet   furosemide (LASIX) 20 MG tablet   lisinopril (PRINIVIL,ZESTRIL) 40 MG tablet   metoprolol  (LOPRESSOR) 50 MG tablet      I have changed Ms. Quimby's JANUVIA to sitaGLIPtin. I am also having her maintain her Calcium-Vitamin D, multivitamin, vitamin B-12, Casanthranol-Docusate Sodium, aspirin, ferrous sulfate, sitaGLIPtin, furosemide, lisinopril, and metoprolol.  Meds ordered this encounter  Medications  . sitaGLIPtin (JANUVIA) 100 MG tablet    Sig: Take 1 tablet (100 mg total) by mouth daily.    Dispense:  30 tablet    Refill:  5    Please consider 90 day supplies to promote better adherence  . furosemide (LASIX) 20 MG tablet    Sig: Take 1 tablet (20 mg total) by mouth daily.    Dispense:  90 tablet    Refill:  3  . lisinopril (PRINIVIL,ZESTRIL) 40 MG tablet    Sig: Take 1 tablet (40 mg total) by mouth daily.    Dispense:  90 tablet    Refill:  3  . metoprolol (LOPRESSOR) 50 MG tablet    Sig: Take 1 tablet (50 mg total) by mouth 2 (two) times daily.    Dispense:  180 tablet    Refill:  3     Donato Schultz, DO

## 2016-09-30 ENCOUNTER — Other Ambulatory Visit: Payer: Self-pay | Admitting: Family Medicine

## 2016-09-30 DIAGNOSIS — D649 Anemia, unspecified: Secondary | ICD-10-CM

## 2016-09-30 DIAGNOSIS — K625 Hemorrhage of anus and rectum: Secondary | ICD-10-CM

## 2016-09-30 MED ORDER — PRAVASTATIN SODIUM 20 MG PO TABS: 20.0000 mg | ORAL_TABLET | Freq: Every day | ORAL | 3 refills | Status: DC

## 2016-10-03 NOTE — Addendum Note (Signed)
Addended by: Harley AltoPRICE, Loren Vicens M on: 10/03/2016 08:02 AM   Modules accepted: Orders

## 2016-10-09 ENCOUNTER — Other Ambulatory Visit (INDEPENDENT_AMBULATORY_CARE_PROVIDER_SITE_OTHER): Payer: Medicare Other

## 2016-10-09 DIAGNOSIS — K625 Hemorrhage of anus and rectum: Secondary | ICD-10-CM

## 2016-10-09 LAB — FECAL OCCULT BLOOD, IMMUNOCHEMICAL: FECAL OCCULT BLD: NEGATIVE

## 2016-10-16 ENCOUNTER — Telehealth: Payer: Self-pay

## 2016-10-16 DIAGNOSIS — I739 Peripheral vascular disease, unspecified: Secondary | ICD-10-CM

## 2016-10-16 NOTE — Telephone Encounter (Signed)
Patient will have repeat carotid on 11/04/16 the day before her office visit with Dr. Eden EmmsNishan.

## 2016-10-16 NOTE — Telephone Encounter (Signed)
-----   Message from Emily Chickla Pate Ingalls, RN sent at 05/14/2016 11:31 AM EST ----- Patient needs repeat carotid US in May/June

## 2016-10-17 ENCOUNTER — Encounter: Payer: Self-pay | Admitting: Cardiovascular Disease

## 2016-10-31 ENCOUNTER — Other Ambulatory Visit: Payer: Self-pay

## 2016-10-31 ENCOUNTER — Other Ambulatory Visit (INDEPENDENT_AMBULATORY_CARE_PROVIDER_SITE_OTHER): Payer: Medicare Other

## 2016-10-31 DIAGNOSIS — D649 Anemia, unspecified: Secondary | ICD-10-CM | POA: Diagnosis not present

## 2016-10-31 LAB — CBC WITH DIFFERENTIAL/PLATELET
BASOS ABS: 0.1 10*3/uL (ref 0.0–0.1)
Basophils Relative: 1.1 % (ref 0.0–3.0)
EOS ABS: 0.4 10*3/uL (ref 0.0–0.7)
Eosinophils Relative: 6.5 % — ABNORMAL HIGH (ref 0.0–5.0)
HCT: 29 % — ABNORMAL LOW (ref 36.0–46.0)
Hemoglobin: 9.9 g/dL — ABNORMAL LOW (ref 12.0–15.0)
LYMPHS ABS: 2.2 10*3/uL (ref 0.7–4.0)
Lymphocytes Relative: 33.5 % (ref 12.0–46.0)
MCHC: 34.2 g/dL (ref 30.0–36.0)
MCV: 103.1 fl — ABNORMAL HIGH (ref 78.0–100.0)
MONO ABS: 1 10*3/uL (ref 0.1–1.0)
Monocytes Relative: 14.8 % — ABNORMAL HIGH (ref 3.0–12.0)
NEUTROS ABS: 2.9 10*3/uL (ref 1.4–7.7)
NEUTROS PCT: 44.1 % (ref 43.0–77.0)
PLATELETS: 191 10*3/uL (ref 150.0–400.0)
RBC: 2.81 Mil/uL — AB (ref 3.87–5.11)
RDW: 12.9 % (ref 11.5–15.5)
WBC: 6.6 10*3/uL (ref 4.0–10.5)

## 2016-10-31 LAB — FERRITIN: FERRITIN: 19.1 ng/mL (ref 10.0–291.0)

## 2016-10-31 LAB — IBC PANEL
Iron: 67 ug/dL (ref 42–145)
SATURATION RATIOS: 18.3 % — AB (ref 20.0–50.0)
TRANSFERRIN: 261 mg/dL (ref 212.0–360.0)

## 2016-11-01 NOTE — Progress Notes (Signed)
Patient ID: Emily Daniel, female   DOB: 02-05-1930, 83 y.o.   MRN: [id number]  Primary cardiologist: Dr. Eden Emms  HPI  Emily Daniel is a 83 y.o.  female who was is here today for a follow-up visit regarding  CAD and PVD  She has known history of CAD (question of remote MI in the 66s), HTN, HL, diabetes, PAD, AVMs, prior tobacco abuse. She was admitted 10/2013 with a NSTEMI c/b acute systolic CHF. Cardiac catheterization demonstrated severe 3 vessel CAD with left main involvement, severe mitral regurgitation and EF was 30-35% by echocardiogram >>>  CABG with mitral valve repair as well as closure of PFO by Dr. Dorris Fetch. 11/13/13  PROCEDURE: Median sternotomy, extracorporeal circulation, coronary artery bypass grafting x3 (left internal mammary artery to left anterior descending, saphenous vein graft to obtuse marginal 1, saphenous vein graft to distal right coronary), mitral valve repair with Sorin memo 3D 30 mm annuloplasty ring, serial # [id number], closure of patent foramen ovale.   Postoperative course was complicated by distal emboli to her toes managed conservatively. She had atrial fibrillation and was placed on amiodarone and Coumadin secondary to intermittent atrial fibrillation but was discharged in NSR.   She was seen for bilateral leg pain mostly at the vein harvest site in both legs. She underwent noninvasive evaluation which showed normal ABI on the left side and severely reduced on the right and 0.4 with evidence of severe inflow disease. The patient does complain of right hip pain after walking about 100 feet. This forces her to stop and rest for few minutes before she can resume.   Dr Kirke Corin ordered an  aortoiliac duplex which showed occlusion of the right common iliac artery with significant stenosis affecting the left common iliac artery. Seen by him 07/14/16 and given age , co morbidities and moderate symptoms chose To observe with no intervention for now  She takes BP at  home in am and they are fine Compliant with meds   Carotid Duplex 2/59/56 40-59% LICA    Reviewed   Allergies  Allergen Reactions  . Meperidine Hcl Nausea And Vomiting  . Sulfonamide Derivatives Nausea And Vomiting  . Demerol [Meperidine]     Unknown per pt      Current Outpatient Prescriptions on File Prior to Visit  Medication Sig Dispense Refill  . aspirin 325 MG tablet Take 325 mg by mouth daily.    . Calcium Carbonate-Vitamin D (CALCIUM-VITAMIN D) 600-200 MG-UNIT CAPS Take 1 capsule by mouth 2 (two) times daily.     [name] Sodium 30-100 MG CAPS Take 1 capsule by mouth daily as needed (for constipation).     . ferrous sulfate 325 (65 FE) MG tablet Take 325 mg by mouth 2 (two) times daily with a meal.     . lisinopril (PRINIVIL,ZESTRIL) 40 MG tablet Take 1 tablet (40 mg total) by mouth daily. 90 tablet 3  . metoprolol (LOPRESSOR) 50 MG tablet Take 1 tablet (50 mg total) by mouth 2 (two) times daily. 180 tablet 3  . Multiple Vitamin (MULTIVITAMIN) tablet Take 1 tablet by mouth daily.      . pravastatin (PRAVACHOL) 20 MG tablet Take 1 tablet (20 mg total) by mouth daily. 30 tablet 3  . sitaGLIPtin (JANUVIA) 100 MG tablet Take 1 tablet (100 mg total) by mouth daily. 30 tablet 5  . vitamin B-12 (CYANOCOBALAMIN) 1000 MCG tablet Take 1,000 mcg by mouth daily.       No current facility-administered medications on file  prior to visit.      Past Medical History:  Diagnosis Date  . Anxiety   . Atrial fibrillation (HCC)    post op >>> req'd Amio and Coumadin  . AVM (arteriovenous malformation)   . CAD (coronary artery disease)    a. NSTEMI 10/2013 >>> LHC (10/2013):  Distal left main 90%, proximal LAD 30%, mid LAD 20%, ostial circumflex 99%, then 80%, mid RCA 99%, EF 30-35%, 3-4+ MR; proximal right iliac occluded, right renal 50%, ostial left iliac 70% >>> CABG/MV repair  . Cataract   . CHF (congestive heart failure) (HCC)    Dr. Dorris FetchHendrickson following  . Chronic  systolic heart failure (HCC)   . Diabetes mellitus, type 2 (HCC)   . Hemorrhoids   . Hx of echocardiogram    Echo (9/15):  Mild LVH, EF 40-45%, inf AK, Gr 1 DD, trivial AI, MV repair ok (mild MS, mild MR), mild LAE, PASP 32 mmHg.  [name]. Hyperlipidemia   . Hypertension   . Ischemic cardiomyopathy    a. Echo (11/08/13):  EF 30-35%, diffuse hypokinesis, severe MR, mild LAE, PASP 48 mm Hg  . Mitral regurgitation    a. s/p MV repair at time of CABG  . Osteoporosis   . PVD (peripheral vascular disease) (HCC)   . Vitamin B12 deficiency      Past Surgical History:  Procedure Laterality Date  . ABDOMINAL HYSTERECTOMY     1969  . APPENDECTOMY     1945  . CORONARY ARTERY BYPASS GRAFT  1990, 1995, 1998  . HEMORRHOID SURGERY     1959  . LEFT AND RIGHT HEART CATHETERIZATION WITH CORONARY ANGIOGRAM N/A 11/10/2013   Procedure: LEFT AND RIGHT HEART CATHETERIZATION WITH CORONARY ANGIOGRAM;  Surgeon: Lennette Biharihomas A Kelly, MD;  Location[facility] C[facility] LAB;  Service: Cardiovascular;  Laterality: N/A;  . MITRAL VALVE REPLACEMENT (MVR)/CORONARY ARTERY BYPASS GRAFTING (CABG) N/A 11/11/2013   Procedure: MITRAL VALVE Ring repair (MVR)/CORONARY ARTERY BYPASS GRAFTING (CABG) times three using left internal mammary and left sphenous vein.;  Surgeon: Loreli SlotSteven C Hendrickson, MD;  Location: [facility] OR;  Service: Open Heart Surgery;  Laterality: N/A;  Coronary artery bypass graft times three using left internal mammary artery and left saphenous leg vein using endoscope.  Exploration of right leg.      Family History  Problem Relation Age of Onset  . Cervical cancer Mother   . Cancer Brother        renal ...x3??  [name]. Hypertension Other   . Stroke Other   . Hypertension Sister   . Heart attack Neg Hx      Social History   Social History  . Marital status: Widowed    Spouse name: N/A  . Number of children: N/A  . Years of education: N/A   Occupational History  . retired Retired   Social History Main Topics  . Smoking  status: Former Smoker    Packs/day: 1.00    Years: 36.00    Types: Cigarettes    Quit date: 02/18/1984  . Smokeless tobacco: Never Used  . Alcohol use No  . Drug use: No  . Sexual activity: Not Currently   Other Topics Concern  . Not on file   Social History Narrative   Exercise-- yard, house work    ECG:  SR rate 67  PR 236  2016  01/02/16  SR rate 69 PR 268    ROS A 10 point review of system was performed. It is negative other than  that mentioned in the history of present illness.   PHYSICAL EXAM  BP (!) 160/72 (BP Location: Right Arm)   Pulse 65   Ht 5' (1.524 m)   Wt 56.4 kg (124 lb 6.4 oz)   BMI 24.30 kg/m   Affect appropriate Healthy:  appears stated age HEENT: normal Neck supple with no adenopathy JVP normal bilateral  bruits no thyromegaly Lungs clear with no wheezing and good diaphragmatic motion Heart:  S1/S2 no murmur, no rub, gallop or click PMI normal Abdomen: benighn, BS positve, no tenderness, no AAA no bruit.  No HSM or HJR Poor distal pulses on right  No edema Neuro non-focal Skin warm and dry No muscular weakness      ASSESSMENT AND PLAN CAD Stable with no angina and good activity level.  Continue medical Rx CABG 2015 MV Repair no residual murmur excellent result  SBE PVD stable RLE f/u Arida  No indication for angio at this time  Occluded right common iliac artery  Left bruit f/u carotid dupex  Done this week reviewed and stable 40-59%% LICA f/u in a year   Chol  At goal  Lab Results  Component Value Date   LDLCALC 121 (H) 09/26/2016    HTN some white coat component continue current meds   F/U with Arida 6 months me in a year    [employer]

## 2016-11-04 ENCOUNTER — Ambulatory Visit (HOSPITAL_COMMUNITY)
Admission: RE | Admit: 2016-11-04 | Discharge: 2016-11-04 | Disposition: A | Discharge status: Home / Self Care | Payer: Medicare Other | Source: Ambulatory Visit | Attending: Cardiology | Admitting: Cardiology

## 2016-11-04 ENCOUNTER — Other Ambulatory Visit: Payer: Self-pay | Admitting: Cardiovascular Disease

## 2016-11-04 ENCOUNTER — Telehealth: Payer: Self-pay | Admitting: Family Medicine

## 2016-11-04 ENCOUNTER — Other Ambulatory Visit: Payer: Self-pay | Admitting: Family Medicine

## 2016-11-04 DIAGNOSIS — I739 Peripheral vascular disease, unspecified: Secondary | ICD-10-CM | POA: Insufficient documentation

## 2016-11-04 DIAGNOSIS — I6523 Occlusion and stenosis of bilateral carotid arteries: Secondary | ICD-10-CM

## 2016-11-04 DIAGNOSIS — I10 Essential (primary) hypertension: Secondary | ICD-10-CM | POA: Insufficient documentation

## 2016-11-04 DIAGNOSIS — I2510 Atherosclerotic heart disease of native coronary artery without angina pectoris: Secondary | ICD-10-CM | POA: Diagnosis not present

## 2016-11-04 DIAGNOSIS — Z951 Presence of aortocoronary bypass graft: Secondary | ICD-10-CM | POA: Diagnosis not present

## 2016-11-04 DIAGNOSIS — E785 Hyperlipidemia, unspecified: Secondary | ICD-10-CM

## 2016-11-04 DIAGNOSIS — Z87891 Personal history of nicotine dependence: Secondary | ICD-10-CM | POA: Diagnosis not present

## 2016-11-04 NOTE — Telephone Encounter (Signed)
She just did an IFOB on 10/09/16

## 2016-11-04 NOTE — Telephone Encounter (Signed)
She wants results from 6/7

## 2016-11-04 NOTE — Telephone Encounter (Signed)
Called left message to call back 

## 2016-11-04 NOTE — Telephone Encounter (Signed)
See result notes. 

## 2016-11-04 NOTE — Telephone Encounter (Signed)
Refer to hematology for anemia

## 2016-11-04 NOTE — Telephone Encounter (Signed)
Noted pt refusal

## 2016-11-04 NOTE — Telephone Encounter (Signed)
Patient did an IFOB on 10/09/16

## 2016-11-04 NOTE — Telephone Encounter (Signed)
hgb worsening Check ifob-- we can leave up front or we can send it-- if neg -- refer to hematology

## 2016-11-04 NOTE — Telephone Encounter (Signed)
The patient informed she does not want a referral at this time. She stated she is going to eat liver/and onions once a week.

## 2016-11-04 NOTE — Telephone Encounter (Signed)
Patient returning call regarding message below.

## 2016-11-04 NOTE — Telephone Encounter (Signed)
Relation to GN:FAOZpt:self Call back number:[phone]   Reason for call:  Patient inquiring about lab results best #  [phone]

## 2016-11-05 ENCOUNTER — Ambulatory Visit (INDEPENDENT_AMBULATORY_CARE_PROVIDER_SITE_OTHER): Payer: Medicare Other | Admitting: Cardiovascular Disease

## 2016-11-05 ENCOUNTER — Encounter: Payer: Self-pay | Admitting: Cardiovascular Disease

## 2016-11-05 ENCOUNTER — Telehealth: Payer: Self-pay

## 2016-11-05 VITALS — BP 160/72 | HR 65 | Ht 60.0 in | Wt 124.4 lb

## 2016-11-05 DIAGNOSIS — I10 Essential (primary) hypertension: Secondary | ICD-10-CM | POA: Diagnosis not present

## 2016-11-05 NOTE — Telephone Encounter (Signed)
-----   Message from Peter C Nishan, MD sent at 11/05/2016  1:09 PM EDT ----- 40-59% LICA stenosis.  F/U duplex in one year  

## 2016-11-05 NOTE — Telephone Encounter (Signed)
Left message for patient to call back to endorse carotid ultrasounds results and recomendations per Dr. Eden EmmsNishan.

## 2016-11-05 NOTE — Patient Instructions (Addendum)

## 2016-12-10 ENCOUNTER — Other Ambulatory Visit: Payer: Self-pay | Admitting: Family Medicine

## 2016-12-10 MED ORDER — PRAVASTATIN SODIUM 20 MG PO TABS: 20.0000 mg | ORAL_TABLET | Freq: Every day | ORAL | 0 refills | Status: DC

## 2017-01-08 ENCOUNTER — Other Ambulatory Visit (INDEPENDENT_AMBULATORY_CARE_PROVIDER_SITE_OTHER): Payer: Medicare Other

## 2017-01-08 DIAGNOSIS — E785 Hyperlipidemia, unspecified: Secondary | ICD-10-CM | POA: Diagnosis not present

## 2017-01-08 LAB — COMPREHENSIVE METABOLIC PANEL
ALBUMIN: 4 g/dL (ref 3.5–5.2)
ALT: 9 U/L (ref 0–35)
AST: 11 U/L (ref 0–37)
Alkaline Phosphatase: 35 U/L — ABNORMAL LOW (ref 39–117)
BUN: 25 mg/dL — ABNORMAL HIGH (ref 6–23)
CALCIUM: 9 mg/dL (ref 8.4–10.5)
CO2: 27 mEq/L (ref 19–32)
Chloride: 108 mEq/L (ref 96–112)
Creatinine, Ser: 1.23 mg/dL — ABNORMAL HIGH (ref 0.40–1.20)
GFR: 44.11 mL/min — AB (ref >60.00)
Glucose, Bld: 110 mg/dL — ABNORMAL HIGH (ref 70–99)
Potassium: 3.9 mEq/L (ref 3.5–5.1)
Sodium: 143 mEq/L (ref 135–145)
TOTAL PROTEIN: 6.1 g/dL (ref 6.0–8.3)
Total Bilirubin: 0.4 mg/dL (ref 0.2–1.2)

## 2017-01-08 LAB — LIPID PANEL
CHOLESTEROL: 142 mg/dL (ref 0–200)
HDL: 42.2 mg/dL (ref >39.00)
LDL Cholesterol: 78 mg/dL (ref 0–99)
NONHDL: 99.75
TRIGLYCERIDES: 109 mg/dL (ref 0.0–149.0)
Total CHOL/HDL Ratio: 3
VLDL: 21.8 mg/dL (ref 0.0–40.0)

## 2017-01-14 ENCOUNTER — Telehealth: Payer: Self-pay

## 2017-01-14 DIAGNOSIS — R799 Abnormal finding of blood chemistry, unspecified: Secondary | ICD-10-CM

## 2017-01-14 DIAGNOSIS — R7989 Other specified abnormal findings of blood chemistry: Secondary | ICD-10-CM

## 2017-01-14 NOTE — Telephone Encounter (Signed)
Spoke with pt, pt states she didn't understand how we know her kidney function were elevated if she didn't give urine. Explained to patient kidney functions were checked via blood draw. Patient states she understand results, instruction to increase fluid intake, and follow up appointment to recheck lab at [facility]. Lab orders are entered. Patient had no further questions or concerns at this time. LB

## 2017-03-10 ENCOUNTER — Other Ambulatory Visit: Payer: Self-pay | Admitting: Family Medicine

## 2017-03-10 DIAGNOSIS — IMO0002 Reserved for concepts with insufficient information to code with codable children: Secondary | ICD-10-CM

## 2017-03-10 DIAGNOSIS — E1151 Type 2 diabetes mellitus with diabetic peripheral angiopathy without gangrene: Secondary | ICD-10-CM

## 2017-03-10 DIAGNOSIS — E1165 Type 2 diabetes mellitus with hyperglycemia: Principal | ICD-10-CM

## 2017-03-11 DIAGNOSIS — H353131 Nonexudative age-related macular degeneration, bilateral, early dry stage: Secondary | ICD-10-CM | POA: Diagnosis not present

## 2017-03-11 DIAGNOSIS — Z961 Presence of intraocular lens: Secondary | ICD-10-CM | POA: Diagnosis not present

## 2017-03-11 DIAGNOSIS — H40053 Ocular hypertension, bilateral: Secondary | ICD-10-CM | POA: Diagnosis not present

## 2017-03-11 DIAGNOSIS — E119 Type 2 diabetes mellitus without complications: Secondary | ICD-10-CM | POA: Diagnosis not present

## 2017-03-24 ENCOUNTER — Other Ambulatory Visit: Payer: Self-pay | Admitting: Family Medicine

## 2017-04-01 ENCOUNTER — Telehealth: Payer: Self-pay | Admitting: *Deleted

## 2017-04-01 NOTE — Telephone Encounter (Signed)
LM for patient to return call to schedule AWV.   

## 2017-04-03 ENCOUNTER — Encounter: Payer: Self-pay | Admitting: Family Medicine

## 2017-04-03 ENCOUNTER — Encounter: Payer: Self-pay | Admitting: *Deleted

## 2017-04-03 ENCOUNTER — Ambulatory Visit (INDEPENDENT_AMBULATORY_CARE_PROVIDER_SITE_OTHER): Payer: Medicare Other | Admitting: *Deleted

## 2017-04-03 ENCOUNTER — Ambulatory Visit: Payer: Self-pay | Admitting: *Deleted

## 2017-04-03 ENCOUNTER — Ambulatory Visit (INDEPENDENT_AMBULATORY_CARE_PROVIDER_SITE_OTHER): Payer: Medicare Other | Admitting: Family Medicine

## 2017-04-03 VITALS — BP 160/64 | HR 65 | Wt 127.0 lb

## 2017-04-03 VITALS — BP 182/90 | Temp 98.2°F | Ht 60.0 in | Wt 127.0 lb

## 2017-04-03 DIAGNOSIS — Z23 Encounter for immunization: Secondary | ICD-10-CM

## 2017-04-03 DIAGNOSIS — E1165 Type 2 diabetes mellitus with hyperglycemia: Secondary | ICD-10-CM

## 2017-04-03 DIAGNOSIS — I10 Essential (primary) hypertension: Secondary | ICD-10-CM

## 2017-04-03 DIAGNOSIS — H6122 Impacted cerumen, left ear: Secondary | ICD-10-CM

## 2017-04-03 DIAGNOSIS — IMO0002 Reserved for concepts with insufficient information to code with codable children: Secondary | ICD-10-CM

## 2017-04-03 DIAGNOSIS — E785 Hyperlipidemia, unspecified: Secondary | ICD-10-CM | POA: Diagnosis not present

## 2017-04-03 DIAGNOSIS — Z0000 Encounter for general adult medical examination without abnormal findings: Secondary | ICD-10-CM

## 2017-04-03 DIAGNOSIS — E1151 Type 2 diabetes mellitus with diabetic peripheral angiopathy without gangrene: Secondary | ICD-10-CM | POA: Diagnosis not present

## 2017-04-03 DIAGNOSIS — D509 Iron deficiency anemia, unspecified: Secondary | ICD-10-CM | POA: Diagnosis not present

## 2017-04-03 LAB — MICROALBUMIN / CREATININE URINE RATIO
CREATININE, U: 79.3 mg/dL
MICROALB/CREAT RATIO: 4.3 mg/g (ref 0.0–30.0)
Microalb, Ur: 3.4 mg/dL — ABNORMAL HIGH (ref 0.0–1.9)

## 2017-04-03 LAB — LIPID PANEL
Cholesterol: 164 mg/dL (ref 0–200)
HDL: 46.8 mg/dL (ref >39.00)
LDL CALC: 97 mg/dL (ref 0–99)
NONHDL: 117.29
Total CHOL/HDL Ratio: 4
Triglycerides: 102 mg/dL (ref 0.0–149.0)
VLDL: 20.4 mg/dL (ref 0.0–40.0)

## 2017-04-03 LAB — COMPREHENSIVE METABOLIC PANEL
ALK PHOS: 34 U/L — AB (ref 39–117)
ALT: 11 U/L (ref 0–35)
AST: 13 U/L (ref 0–37)
Albumin: 4.1 g/dL (ref 3.5–5.2)
BUN: 20 mg/dL (ref 6–23)
CALCIUM: 9.9 mg/dL (ref 8.4–10.5)
CO2: 27 meq/L (ref 19–32)
Chloride: 109 mEq/L (ref 96–112)
Creatinine, Ser: 1.19 mg/dL (ref 0.40–1.20)
GFR: 45.8 mL/min — AB (ref >60.00)
GLUCOSE: 124 mg/dL — AB (ref 70–99)
POTASSIUM: 4.2 meq/L (ref 3.5–5.1)
Sodium: 143 mEq/L (ref 135–145)
Total Bilirubin: 0.6 mg/dL (ref 0.2–1.2)
Total Protein: 6.9 g/dL (ref 6.0–8.3)

## 2017-04-03 LAB — HEMOGLOBIN A1C: HEMOGLOBIN A1C: 5 % (ref 4.6–6.5)

## 2017-04-03 LAB — POC URINALSYSI DIPSTICK (AUTOMATED)
BILIRUBIN UA: NEGATIVE
Glucose, UA: NEGATIVE
KETONES UA: NEGATIVE
LEUKOCYTES UA: NEGATIVE
NITRITE UA: NEGATIVE
PH UA: 6 (ref 5.0–8.0)
PROTEIN UA: NEGATIVE
RBC UA: NEGATIVE
Spec Grav, UA: >=1.03 — AB (ref 1.010–1.025)
Urobilinogen, UA: 0.2 E.U./dL

## 2017-04-03 NOTE — Assessment & Plan Note (Signed)
Use debrox and rto prn  

## 2017-04-03 NOTE — Assessment & Plan Note (Signed)
Tolerating statin, encouraged heart healthy diet, avoid trans fats, minimize simple carbs and saturated fats. Increase exercise as tolerated 

## 2017-04-03 NOTE — Assessment & Plan Note (Signed)
Check labs 

## 2017-04-03 NOTE — Progress Notes (Signed)
Subjective:     Emily Daniel is a 83 y.o. female and is here for a comprehensive physical exam. The patient reports no problems.  HPI HYPERTENSION   Blood pressure range-not checking   Chest pain- no      Dyspnea- no Lightheadedness- no   Edema- no  Other side effects - no   compliance: god Low salt diet- yes    DIABETES    Blood Sugar ranges-90s   Polyuria- no New Visual problems- no  Hypoglycemic symptoms- no  Other side effects-no Medication compliance - good Last eye exam- 02/2017 Foot exam- today   HYPERLIPIDEMIA  Medication compliance- good RUQ pain- no  Muscle aches- no Other side effects-no  Social History   Socioeconomic History  . Marital status: Widowed    Spouse name: Not on file  . Number of children: Not on file  . Years of education: Not on file  . Highest education level: Not on file  Social Needs  . Financial resource strain: Not on file  . Food insecurity - worry: Not on file  . Food insecurity - inability: Not on file  . Transportation needs - medical: Not on file  . Transportation needs - non-medical: Not on file  Occupational History  . Occupation: retired    Associate Professormployer: RETIRED  Tobacco Use  . Smoking status: Former Smoker    Packs/day: 1.00    Years: 36.00    Pack years: 36.00    Types: Cigarettes    Last attempt to quit: 02/18/1984    Years since quitting: 33.1  . Smokeless tobacco: Never Used  Substance and Sexual Activity  . Alcohol use: No  . Drug use: No  . Sexual activity: Not Currently  Other Topics Concern  . Not on file  Social History Narrative   Exercise-- yard, house work   Health Maintenance  Topic Date Due  . HEMOGLOBIN A1C  03/29/2017  . MAMMOGRAM  07/12/2017  . OPHTHALMOLOGY EXAM  03/11/2018  . FOOT EXAM  04/03/2018  . TETANUS/TDAP  04/15/2021  . INFLUENZA VACCINE  Completed  . DEXA SCAN  Completed  . PNA vac Low Risk Adult  Completed    The following portions of the patient's history were reviewed and  updated as appropriate:  She  has a past medical history of Anxiety, Atrial fibrillation (HCC), AVM (arteriovenous malformation), CAD (coronary artery disease), Cataract, CHF (congestive heart failure) (HCC), Chronic systolic heart failure (HCC), Diabetes mellitus, type 2 (HCC), Hemorrhoids, echocardiogram, Hyperlipidemia, Hypertension, Ischemic cardiomyopathy, Mitral regurgitation, Osteoporosis, PVD (peripheral vascular disease) (HCC), and Vitamin B12 deficiency. She does not have any pertinent problems on file. She  has a past surgical history that includes Appendectomy; Abdominal hysterectomy; Hemorrhoid surgery; Coronary artery bypass graft (1990, 1995, 1998); MITRAL VALVE Ring repair (MVR)/CORONARY ARTERY BYPASS GRAFTING (CABG) times three using left internal mammary and left sphenous vein. (N/A, 11/11/2013); and LEFT AND RIGHT HEART CATHETERIZATION WITH CORONARY ANGIOGRAM (N/A, 11/10/2013). Her family history includes Cancer in her brother; Cervical cancer in her mother; Hypertension in her other and sister; Stroke in her other. She  reports that she quit smoking about 33 years ago. Her smoking use included cigarettes. She has a 36.00 pack-year smoking history. she has never used smokeless tobacco. She reports that she does not drink alcohol or use drugs. She has a current medication list which includes the following prescription(s): aspirin, calcium-vitamin d, casanthranol-docusate sodium, ferrous sulfate, januvia, lisinopril, metoprolol tartrate, multivitamin, pravastatin, sitagliptin, and vitamin b-12. Current Outpatient Medications on  File Prior to Visit  Medication Sig Dispense Refill  . Aspirin (ECOTRIN LOW STRENGTH PO) Take by mouth.    . Calcium Carbonate-Vitamin D (CALCIUM-VITAMIN D) 600-200 MG-UNIT CAPS Take 1 capsule by mouth 2 (two) times daily.     [name] Sodium 30-100 MG CAPS Take 1 capsule by mouth daily as needed (for constipation).     . ferrous sulfate 325 (65 FE)  MG tablet Take 325 mg by mouth 2 (two) times daily with a meal.     . JANUVIA 100 MG tablet TAKE 1 TABLET BY MOUTH ONCE DAILY 30 tablet 5  . lisinopril (PRINIVIL,ZESTRIL) 40 MG tablet Take 1 tablet (40 mg total) by mouth daily. 90 tablet 3  . metoprolol (LOPRESSOR) 50 MG tablet Take 1 tablet (50 mg total) by mouth 2 (two) times daily. 180 tablet 3  . Multiple Vitamin (MULTIVITAMIN) tablet Take 1 tablet by mouth daily.      . pravastatin (PRAVACHOL) 20 MG tablet TAKE 1 TABLET BY MOUTH ONCE DAILY 30 tablet 3  . sitaGLIPtin (JANUVIA) 100 MG tablet Take 1 tablet (100 mg total) by mouth daily. 30 tablet 5  . vitamin B-12 (CYANOCOBALAMIN) 1000 MCG tablet Take 1,000 mcg by mouth daily.       No current facility-administered medications on file prior to visit.    She is allergic to meperidine hcl; sulfonamide derivatives; and demerol [meperidine]..  Review of Systems Review of Systems  Constitutional: Negative for activity change, appetite change and fatigue.  HENT: Negative for hearing loss, congestion, tinnitus and ear discharge.  dentist q17m Eyes: Negative for visual disturbance (see optho q1y -- vision corrected to 20/20 with glasses).  Respiratory: Negative for cough, chest tightness and shortness of breath.   Cardiovascular: Negative for chest pain, palpitations and leg swelling.  Gastrointestinal: Negative for abdominal pain, diarrhea, constipation and abdominal distention.  Genitourinary: Negative for urgency, frequency, decreased urine volume and difficulty urinating.  Musculoskeletal: Negative for back pain, arthralgias and gait problem.  Skin: Negative for color change, pallor and rash.  Neurological: Negative for dizziness, light-headedness, numbness and headaches.  Hematological: Negative for adenopathy. Does not bruise/bleed easily.  Psychiatric/Behavioral: Negative for suicidal ideas, confusion, sleep disturbance, self-injury, dysphoric mood, decreased concentration and agitation.        Objective:    BP (!) 182/90   Temp 98.2 F (36.8 C)   Ht 5' (1.524 m)   Wt 127 lb (57.6 kg)   BMI 24.80 kg/m  General appearance: alert, cooperative, appears stated age and no distress Head: Normocephalic, without obvious abnormality, atraumatic Eyes: negative findings: lids and lashes normal and pupils equal, round, reactive to light and accomodation Ears: normal TM's and external ear canals both ears Nose: Nares normal. Septum midline. Mucosa normal. No drainage or sinus tenderness. Throat: lips, mucosa, and tongue normal; teeth and gums normal Neck: no adenopathy, no carotid bruit, no JVD, supple, symmetrical, trachea midline and thyroid not enlarged, symmetric, no tenderness/mass/nodules Back: symmetric, no curvature. ROM normal. No CVA tenderness. Lungs: clear to auscultation bilaterally Breasts: pt refused Heart: regular rate and rhythm, S1, S2 normal, no murmur, click, rub or gallop Abdomen: soft, non-tender; bowel sounds normal; no masses,  no organomegaly Pelvic: not indicated; post-menopausal, no abnormal Pap smears in past Extremities: extremities normal, atraumatic, no cyanosis or edema Pulses: 2+ and symmetric Skin: Skin color, texture, turgor normal. No rashes or lesions Lymph nodes: Cervical, supraclavicular, and axillary nodes normal. Neurologic: Alert and oriented X 3, normal strength and tone. Normal symmetric  reflexes. Normal coordination and gait   Sensory exam of the foot is normal, tested with the monofilament. Good pulses, no lesions or ulcers, good peripheral pulses.  Assessment:    Healthy female exam.      Plan:    ghm utd Check labs  See After Visit Summary for Counseling Recommendations    1. Need for immunization against influenza  - Flu vaccine HIGH DOSE PF (Fluzone High dose)  2. Preventative health care See above  3. DM (diabetes mellitus) type II uncontrolled, periph vascular disorder (HCC) Well controlled, no changes to  meds. Encouraged heart healthy diet such as the DASH diet and exercise as tolerated.   - Hemoglobin A1c - Comprehensive metabolic panel - Microalbumin / creatinine urine ratio - POCT Urinalysis Dipstick (Automated)  4. Hyperlipidemia LDL goal <70 Encouraged heart healthy diet, increase exercise, avoid trans fats, consider a krill oil cap daily - Lipid panel - Comprehensive metabolic panel - Microalbumin / creatinine urine ratio - POCT Urinalysis Dipstick (Automated)  5. Essential hypertension   - Comprehensive metabolic panel - Microalbumin / creatinine urine ratio - POCT Urinalysis Dipstick (Automated)  6. Hyperlipidemia, unspecified hyperlipidemia type    7. Iron deficiency anemia, unspecified iron deficiency anemia type con't iron  8. Impacted cerumen of left ear Use debrox for several lays Then she can come in and we can irrigation

## 2017-04-03 NOTE — Assessment & Plan Note (Signed)
Running high today Pt has not taken meds today yet

## 2017-04-03 NOTE — Patient Instructions (Addendum)
Use debrox or colace in ears daily for ear wax and return to the office for irrigation if needed    Preventive Care 81 Years and Older, Female Preventive care refers to lifestyle choices and visits with your health care provider that can promote health and wellness. What does preventive care include?  A yearly physical exam. This is also called an annual well check.  Dental exams once or twice a year.  Routine eye exams. Ask your health care provider how often you should have your eyes checked.  Personal lifestyle choices, including: ? Daily care of your teeth and gums. ? Regular physical activity. ? Eating a healthy diet. ? Avoiding tobacco and drug use. ? Limiting alcohol use. ? Practicing safe sex. ? Taking low-dose aspirin every day. ? Taking vitamin and mineral supplements as recommended by your health care provider. What happens during an annual well check? The services and screenings done by your health care provider during your annual well check will depend on your age, overall health, lifestyle risk factors, and family history of disease. Counseling Your health care provider may ask you questions about your:  Alcohol use.  Tobacco use.  Drug use.  Emotional well-being.  Home and relationship well-being.  Sexual activity.  Eating habits.  History of falls.  Memory and ability to understand (cognition).  Work and work Statistician.  Reproductive health.  Screening You may have the following tests or measurements:  Height, weight, and BMI.  Blood pressure.  Lipid and cholesterol levels. These may be checked every 5 years, or more frequently if you are over 81 years old.  Skin check.  Lung cancer screening. You may have this screening every year starting at age 81 if you have a 30-pack-year history of smoking and currently smoke or have quit within the past 15 years.  Fecal occult blood test (FOBT) of the stool. You may have this test every year  starting at age 81.  Flexible sigmoidoscopy or colonoscopy. You may have a sigmoidoscopy every 5 years or a colonoscopy every 10 years starting at age 81.  Hepatitis C blood test.  Hepatitis B blood test.  Sexually transmitted disease (STD) testing.  Diabetes screening. This is done by checking your blood sugar (glucose) after you have not eaten for a while (fasting). You may have this done every 1-3 years.  Bone density scan. This is done to screen for osteoporosis. You may have this done starting at age 81.  Mammogram. This may be done every 1-2 years. Talk to your health care provider about how often you should have regular mammograms.  Talk with your health care provider about your test results, treatment options, and if necessary, the need for more tests. Vaccines Your health care provider may recommend certain vaccines, such as:  Influenza vaccine. This is recommended every year.  Tetanus, diphtheria, and acellular pertussis (Tdap, Td) vaccine. You may need a Td booster every 10 years.  Varicella vaccine. You may need this if you have not been vaccinated.  Zoster vaccine. You may need this after age 81.  Measles, mumps, and rubella (MMR) vaccine. You may need at least one dose of MMR if you were born in 1957 or later. You may also need a second dose.  Pneumococcal 13-valent conjugate (PCV13) vaccine. One dose is recommended after age 81.  Pneumococcal polysaccharide (PPSV23) vaccine. One dose is recommended after age 81.  Meningococcal vaccine. You may need this if you have certain conditions.  Hepatitis A vaccine. You may  need this if you have certain conditions or if you travel or work in places where you may be exposed to hepatitis A.  Hepatitis B vaccine. You may need this if you have certain conditions or if you travel or work in places where you may be exposed to hepatitis B.  Haemophilus influenzae type b (Hib) vaccine. You may need this if you have certain  conditions.  Talk to your health care provider about which screenings and vaccines you need and how often you need them. This information is not intended to replace advice given to you by your health care provider. Make sure you discuss any questions you have with your health care provider. Document Released: 06/09/2015 Document Revised: 01/31/2016 Document Reviewed: 03/14/2015 Elsevier Interactive Patient Education  2017 [employer].

## 2017-04-03 NOTE — Patient Instructions (Addendum)
Emily Daniel , Thank you for taking time to come for your Medicare Wellness Visit. I appreciate your ongoing commitment to your health goals. Please review the following plan we discussed and let me know if I can assist you in the future.   These are the goals we discussed: Maintain current health   This is a list of the screening recommended for you and due dates:  Health Maintenance  Topic Date Due  . Hemoglobin A1C  03/29/2017  . Mammogram  07/12/2017  . Eye exam for diabetics  03/11/2018  . Complete foot exam   04/03/2018  . Tetanus Vaccine  04/15/2021  . Flu Shot  Completed  . DEXA scan (bone density measurement)  Completed  . Pneumonia vaccines  Completed   Continue to eat heart healthy diet (full of fruits, vegetables, whole grains, lean protein, water--limit salt, fat, and sugar intake) and increase physical activity as tolerated. Continue doing brain stimulating activities (puzzles, reading, adult coloring books, staying active) to keep memory sharp.   Bring a copy of your living will and/or healthcare power of attorney to your next office visit.   Health Maintenance for Postmenopausal Women Menopause is a normal process in which your reproductive ability comes to an end. This process happens gradually over a span of months to years, usually between the ages of 46 and 19. Menopause is complete when you have missed 12 consecutive menstrual periods. It is important to talk with your health care provider about some of the most common conditions that affect postmenopausal women, such as heart disease, cancer, and bone loss (osteoporosis). Adopting a healthy lifestyle and getting preventive care can help to promote your health and wellness. Those actions can also lower your chances of developing some of these common conditions. What should I know about menopause? During menopause, you may experience a number of symptoms, such as:  Moderate-to-severe hot flashes.  Night  sweats.  Decrease in sex drive.  Mood swings.  Headaches.  Tiredness.  Irritability.  Memory problems.  Insomnia.  Choosing to treat or not to treat menopausal changes is an individual decision that you make with your health care provider. What should I know about hormone replacement therapy and supplements? Hormone therapy products are effective for treating symptoms that are associated with menopause, such as hot flashes and night sweats. Hormone replacement carries certain risks, especially as you become older. If you are thinking about using estrogen or estrogen with progestin treatments, discuss the benefits and risks with your health care provider. What should I know about heart disease and stroke? Heart disease, heart attack, and stroke become more likely as you age. This may be due, in part, to the hormonal changes that your body experiences during menopause. These can affect how your body processes dietary fats, triglycerides, and cholesterol. Heart attack and stroke are both medical emergencies. There are many things that you can do to help prevent heart disease and stroke:  Have your blood pressure checked at least every 1-2 years. High blood pressure causes heart disease and increases the risk of stroke.  If you are 64-83 years old, ask your health care provider if you should take aspirin to prevent a heart attack or a stroke.  Do not use any tobacco products, including cigarettes, chewing tobacco, or electronic cigarettes. If you need help quitting, ask your health care provider.  It is important to eat a healthy diet and maintain a healthy weight. ? Be sure to include plenty of vegetables, fruits, low-fat  dairy products, and lean protein. ? Avoid eating foods that are high in solid fats, added sugars, or salt (sodium).  Get regular exercise. This is one of the most important things that you can do for your health. ? Try to exercise for at least 150 minutes each week.  The type of exercise that you do should increase your heart rate and make you sweat. This is known as moderate-intensity exercise. ? Try to do strengthening exercises at least twice each week. Do these in addition to the moderate-intensity exercise.  Know your numbers.Ask your health care provider to check your cholesterol and your blood glucose. Continue to have your blood tested as directed by your health care provider.  What should I know about cancer screening? There are several types of cancer. Take the following steps to reduce your risk and to catch any cancer development as early as possible. Breast Cancer  Practice breast self-awareness. ? This means understanding how your breasts normally appear and feel. ? It also means doing regular breast self-exams. Let your health care provider know about any changes, no matter how small.  If you are 30 or older, have a clinician do a breast exam (clinical breast exam or CBE) every year. Depending on your age, family history, and medical history, it may be recommended that you also have a yearly breast X-ray (mammogram).  If you have a family history of breast cancer, talk with your health care provider about genetic screening.  If you are at high risk for breast cancer, talk with your health care provider about having an MRI and a mammogram every year.  Breast cancer (BRCA) gene test is recommended for women who have family members with BRCA-related cancers. Results of the assessment will determine the need for genetic counseling and BRCA1 and for BRCA2 testing. BRCA-related cancers include these types: ? Breast. This occurs in males or females. ? Ovarian. ? Tubal. This may also be called fallopian tube cancer. ? Cancer of the abdominal or pelvic lining (peritoneal cancer). ? Prostate. ? Pancreatic.  Cervical, Uterine, and Ovarian Cancer Your health care provider may recommend that you be screened regularly for cancer of the pelvic  organs. These include your ovaries, uterus, and vagina. This screening involves a pelvic exam, which includes checking for microscopic changes to the surface of your cervix (Pap test).  For women ages 21-65, health care providers may recommend a pelvic exam and a Pap test every three years. For women ages 58-65, they may recommend the Pap test and pelvic exam, combined with testing for human papilloma virus (HPV), every five years. Some types of HPV increase your risk of cervical cancer. Testing for HPV may also be done on women of any age who have unclear Pap test results.  Other health care providers may not recommend any screening for nonpregnant women who are considered low risk for pelvic cancer and have no symptoms. Ask your health care provider if a screening pelvic exam is right for you.  If you have had past treatment for cervical cancer or a condition that could lead to cancer, you need Pap tests and screening for cancer for at least 20 years after your treatment. If Pap tests have been discontinued for you, your risk factors (such as having a new sexual partner) need to be reassessed to determine if you should start having screenings again. Some women have medical problems that increase the chance of getting cervical cancer. In these cases, your health care provider may recommend  that you have screening and Pap tests more often.  If you have a family history of uterine cancer or ovarian cancer, talk with your health care provider about genetic screening.  If you have vaginal bleeding after reaching menopause, tell your health care provider.  There are currently no reliable tests available to screen for ovarian cancer.  Lung Cancer Lung cancer screening is recommended for adults 63-26 years old who are at high risk for lung cancer because of a history of smoking. A yearly low-dose CT scan of the lungs is recommended if you:  Currently smoke.  Have a history of at least 30 pack-years of  smoking and you currently smoke or have quit within the past 15 years. A pack-year is smoking an average of one pack of cigarettes per day for one year.  Yearly screening should:  Continue until it has been 15 years since you quit.  Stop if you develop a health problem that would prevent you from having lung cancer treatment.  Colorectal Cancer  This type of cancer can be detected and can often be prevented.  Routine colorectal cancer screening usually begins at age 29 and continues through age 44.  If you have risk factors for colon cancer, your health care provider may recommend that you be screened at an earlier age.  If you have a family history of colorectal cancer, talk with your health care provider about genetic screening.  Your health care provider may also recommend using home test kits to check for hidden blood in your stool.  A small camera at the end of a tube can be used to examine your colon directly (sigmoidoscopy or colonoscopy). This is done to check for the earliest forms of colorectal cancer.  Direct examination of the colon should be repeated every 5-10 years until age 42. However, if early forms of precancerous polyps or small growths are found or if you have a family history or genetic risk for colorectal cancer, you may need to be screened more often.  Skin Cancer  Check your skin from head to toe regularly.  Monitor any moles. Be sure to tell your health care provider: ? About any new moles or changes in moles, especially if there is a change in a mole's shape or color. ? If you have a mole that is larger than the size of a pencil eraser.  If any of your family members has a history of skin cancer, especially at a young age, talk with your health care provider about genetic screening.  Always use sunscreen. Apply sunscreen liberally and repeatedly throughout the day.  Whenever you are outside, protect yourself by wearing long sleeves, pants, a wide-brimmed  hat, and sunglasses.  What should I know about osteoporosis? Osteoporosis is a condition in which bone destruction happens more quickly than new bone creation. After menopause, you may be at an increased risk for osteoporosis. To help prevent osteoporosis or the bone fractures that can happen because of osteoporosis, the following is recommended:  If you are 25-6 years old, get at least 1,000 mg of calcium and at least 600 mg of vitamin D per day.  If you are older than age 98 but younger than age 38, get at least 1,200 mg of calcium and at least 600 mg of vitamin D per day.  If you are older than age 86, get at least 1,200 mg of calcium and at least 800 mg of vitamin D per day.  Smoking and excessive alcohol intake  increase the risk of osteoporosis. Eat foods that are rich in calcium and vitamin D, and do weight-bearing exercises several times each week as directed by your health care provider. What should I know about how menopause affects my mental health? Depression may occur at any age, but it is more common as you become older. Common symptoms of depression include:  Low or sad mood.  Changes in sleep patterns.  Changes in appetite or eating patterns.  Feeling an overall lack of motivation or enjoyment of activities that you previously enjoyed.  Frequent crying spells.  Talk with your health care provider if you think that you are experiencing depression. What should I know about immunizations? It is important that you get and maintain your immunizations. These include:  Tetanus, diphtheria, and pertussis (Tdap) booster vaccine.  Influenza every year before the flu season begins.  Pneumonia vaccine.  Shingles vaccine.  Your health care provider may also recommend other immunizations. This information is not intended to replace advice given to you by your health care provider. Make sure you discuss any questions you have with your health care provider. Document Released:  07/05/2005 Document Revised: 12/01/2015 Document Reviewed: 02/14/2015 Elsevier Interactive Patient Education  2018 [employer].

## 2017-04-03 NOTE — Assessment & Plan Note (Signed)
hgba1c to be checked, minimize simple carbs. Increase exercise as tolerated. Continue current meds  

## 2017-04-03 NOTE — Progress Notes (Deleted)
Subjective:   Emily Daniel is a 83 y.o. female who presents for Medicare Annual (Subsequent) preventive examination.  Review of Systems:  ***       Objective:     Vitals: BP (!) 182/90   Temp 98.2 F (36.8 C)   Ht 5' (1.524 m)   Wt 127 lb (57.6 kg)   BMI 24.80 kg/m   Body mass index is 24.8 kg/m.   Tobacco Social History   Tobacco Use  Smoking Status Former Smoker  . Packs/day: 1.00  . Years: 36.00  . Pack years: 36.00  . Types: Cigarettes  . Last attempt to quit: 02/18/1984  . Years since quitting: 33.1  Smokeless Tobacco Never Used     Counseling given: Not Answered   Past Medical History:  Diagnosis Date  . Anxiety   . Atrial fibrillation (HCC)    post op >>> req'd Amio and Coumadin  . AVM (arteriovenous malformation)   . CAD (coronary artery disease)    a. NSTEMI 10/2013 >>> LHC (10/2013):  Distal left main 90%, proximal LAD 30%, mid LAD 20%, ostial circumflex 99%, then 80%, mid RCA 99%, EF 30-35%, 3-4+ MR; proximal right iliac occluded, right renal 50%, ostial left iliac 70% >>> CABG/MV repair  . Cataract   . CHF (congestive heart failure) (HCC)    Dr. Dorris FetchHendrickson following  . Chronic systolic heart failure (HCC)   . Diabetes mellitus, type 2 (HCC)   . Hemorrhoids   . Hx of echocardiogram    Echo (9/15):  Mild LVH, EF 40-45%, inf AK, Gr 1 DD, trivial AI, MV repair ok (mild MS, mild MR), mild LAE, PASP 32 mmHg.  Emily Daniel. Hyperlipidemia   . Hypertension   . Ischemic cardiomyopathy    a. Echo (11/08/13):  EF 30-35%, diffuse hypokinesis, severe MR, mild LAE, PASP 48 mm Hg  . Mitral regurgitation    a. s/p MV repair at time of CABG  . Osteoporosis   . PVD (peripheral vascular disease) (HCC)   . Vitamin B12 deficiency    Past Surgical History:  Procedure Laterality Date  . ABDOMINAL HYSTERECTOMY     1969  . APPENDECTOMY     1945  . CORONARY ARTERY BYPASS GRAFT  1990, 1995, 1998  . HEMORRHOID SURGERY     1959   Family History  Problem Relation Age  of Onset  . Cervical cancer Mother   . Cancer Brother        renal ...x3??  Emily Daniel. Hypertension Other   . Stroke Other   . Hypertension Sister   . Heart attack Neg Hx    Social History   Substance and Sexual Activity  Sexual Activity Not Currently    Outpatient Encounter Medications as of 04/03/2017  Medication Sig  . Aspirin (ECOTRIN LOW STRENGTH PO) Take by mouth.  . Calcium Carbonate-Vitamin D (CALCIUM-VITAMIN D) 600-200 MG-UNIT CAPS Take 1 capsule by mouth 2 (two) times daily.   Emily Daniel. Casanthranol-Docusate Sodium 30-100 MG CAPS Take 1 capsule by mouth daily as needed (for constipation).   . ferrous sulfate 325 (65 FE) MG tablet Take 325 mg by mouth 2 (two) times daily with a meal.   . JANUVIA 100 MG tablet TAKE 1 TABLET BY MOUTH ONCE DAILY  . lisinopril (PRINIVIL,ZESTRIL) 40 MG tablet Take 1 tablet (40 mg total) by mouth daily.  . metoprolol (LOPRESSOR) 50 MG tablet Take 1 tablet (50 mg total) by mouth 2 (two) times daily.  . Multiple Vitamin (MULTIVITAMIN) tablet Take  1 tablet by mouth daily.    . pravastatin (PRAVACHOL) 20 MG tablet TAKE 1 TABLET BY MOUTH ONCE DAILY  . sitaGLIPtin (JANUVIA) 100 MG tablet Take 1 tablet (100 mg total) by mouth daily.  . vitamin B-12 (CYANOCOBALAMIN) 1000 MCG tablet Take 1,000 mcg by mouth daily.    . [DISCONTINUED] furosemide (LASIX) 20 MG tablet Take 20 mg by mouth daily as needed (swelling).  . [DISCONTINUED] aspirin 325 MG tablet Take 325 mg by mouth daily.   No facility-administered encounter medications on file as of 04/03/2017.     Activities of Daily Living In your present state of health, do you have any difficulty performing the following activities: 04/03/2017  Hearing? Y  Vision? N  Difficulty concentrating or making decisions? N  Walking or climbing stairs? N  Dressing or bathing? N  Doing errands, shopping? N  Some recent data might be hidden    Patient Care Team: Zola ButtonLowne Chase, Grayling CongressYvonne R, DO as PCP - General Iran OuchArida, Muhammad A, MD as  Consulting Physician (Cardiology) Wendall StadeNishan, Peter C, MD as Consulting Physician (Cardiology) Ernesto RutherfordGroat, Robert, MD as Consulting Physician (Ophthalmology)    Assessment:    *** Exercise Activities and Dietary recommendations    Goals    None     Fall Risk Fall Risk  04/03/2017 01/11/2016 05/16/2015 04/19/2014 04/01/2013  Falls in the past year? No No No No No   Depression Screen PHQ 2/9 Scores 04/03/2017 01/11/2016 05/16/2015 04/19/2014  PHQ - 2 Score 0 0 0 0     Cognitive Function MMSE - Mini Mental State Exam 01/11/2016  Orientation to time 5  Orientation to Place 5  Registration 3  Attention/ Calculation 5  Recall 3  Language- name 2 objects 2  Language- repeat 1  Language- follow 3 step command 3  Language- read & follow direction 1  Write a sentence 1  Copy design 1  Total score 30        Immunization History  Administered Date(s) Administered  . Influenza Split 03/20/2011, 03/31/2012  . Influenza Whole 03/04/2008, 03/28/2009, 03/16/2010  . Influenza, High Dose Seasonal PF 04/19/2014, 05/16/2015, 02/13/2016, 04/03/2017  . Pneumococcal Conjugate-13 12/21/2013  . Pneumococcal Polysaccharide-23 03/10/1997, 01/11/2016  . Tdap 04/16/2011  . Zoster 05/14/2007   Screening Tests Health Maintenance  Topic Date Due  . HEMOGLOBIN A1C  03/29/2017  . MAMMOGRAM  07/12/2017  . OPHTHALMOLOGY EXAM  03/11/2018  . FOOT EXAM  04/03/2018  . TETANUS/TDAP  04/15/2021  . INFLUENZA VACCINE  Completed  . DEXA SCAN  Completed  . PNA vac Low Risk Adult  Completed      Plan:   ***   I have personally reviewed and noted the following in the patient's chart:   . Medical and social history . Use of alcohol, tobacco or illicit drugs  . Current medications and supplements . Functional ability and status . Nutritional status . Physical activity . Advanced directives . List of other physicians . Hospitalizations, surgeries, and ER visits in previous 12 months . Vitals . Screenings  to include cognitive, depression, and falls . Referrals and appointments  In addition, I have reviewed and discussed with patient certain preventive protocols, quality metrics, and best practice recommendations. A written personalized care plan for preventive services as well as general preventive health recommendations were provided to patient.     Avon GullyBritt, Ryun Velez Angel, [place]RN  04/03/2017

## 2017-04-03 NOTE — Progress Notes (Signed)
Subjective:   Emily Daniel is a 83 y.o. female who presents for Medicare Annual (Subsequent) preventive examination.  Review of Systems:  No ROS.  Medicare Wellness Visit. Additional risk factors are reflected in the social history.  Cardiac Risk Factors include: advanced age (>4755men, 5>65 women);diabetes mellitus;dyslipidemia;hypertension;sedentary lifestyle Sleep patterns: Sleeps well per pt for about 9 hrs per night.     Female:   Mammo- last 07/12/16-normal       Dexa scan- last 05/30/15: osteoporosis          Objective:     Vitals: BP (!) 160/64 (BP Location: Left Arm, Patient Position: Sitting, Cuff Size: Normal)   Pulse 65   Wt 127 lb (57.6 kg)   SpO2 99%   BMI 24.80 kg/m   Body mass index is 24.8 kg/m.   Tobacco Social History   Tobacco Use  Smoking Status Former Smoker  . Packs/day: 1.00  . Years: 36.00  . Pack years: 36.00  . Types: Cigarettes  . Last attempt to quit: 02/18/1984  . Years since quitting: 33.1  Smokeless Tobacco Never Used     Counseling given: Not Answered   Past Medical History:  Diagnosis Date  . Anxiety   . Atrial fibrillation (HCC)    post op >>> req'd Amio and Coumadin  . AVM (arteriovenous malformation)   . CAD (coronary artery disease)    a. NSTEMI 10/2013 >>> LHC (10/2013):  Distal left main 90%, proximal LAD 30%, mid LAD 20%, ostial circumflex 99%, then 80%, mid RCA 99%, EF 30-35%, 3-4+ MR; proximal right iliac occluded, right renal 50%, ostial left iliac 70% >>> CABG/MV repair  . Cataract   . CHF (congestive heart failure) (HCC)    Dr. Dorris FetchHendrickson following  . Chronic systolic heart failure (HCC)   . Diabetes mellitus, type 2 (HCC)   . Hemorrhoids   . Hx of echocardiogram    Echo (9/15):  Mild LVH, EF 40-45%, inf AK, Gr 1 DD, trivial AI, MV repair ok (mild MS, mild MR), mild LAE, PASP 32 mmHg.  [name]. Hyperlipidemia   . Hypertension   . Ischemic cardiomyopathy    a. Echo (11/08/13):  EF 30-35%, diffuse hypokinesis, severe  MR, mild LAE, PASP 48 mm Hg  . Mitral regurgitation    a. s/p MV repair at time of CABG  . Osteoporosis   . PVD (peripheral vascular disease) (HCC)   . Vitamin B12 deficiency    Past Surgical History:  Procedure Laterality Date  . ABDOMINAL HYSTERECTOMY     1969  . APPENDECTOMY     1945  . CORONARY ARTERY BYPASS GRAFT  1990, 1995, 1998  . HEMORRHOID SURGERY     1959   Family History  Problem Relation Age of Onset  . Cervical cancer Mother   . Cancer Brother        renal ...x3??  [name]. Hypertension Other   . Stroke Other   . Hypertension Sister   . Heart attack Neg Hx    Social History   Substance and Sexual Activity  Sexual Activity Not Currently    Outpatient Encounter Medications as of 04/03/2017  Medication Sig  . Aspirin (ECOTRIN LOW STRENGTH PO) Take by mouth.  . Calcium Carbonate-Vitamin D (CALCIUM-VITAMIN D) 600-200 MG-UNIT CAPS Take 1 capsule by mouth 2 (two) times daily.   [name]. Casanthranol-Docusate Sodium 30-100 MG CAPS Take 1 capsule by mouth daily as needed (for constipation).   . ferrous sulfate 325 (65 FE) MG tablet Take  325 mg by mouth 2 (two) times daily with a meal.   . JANUVIA 100 MG tablet TAKE 1 TABLET BY MOUTH ONCE DAILY  . lisinopril (PRINIVIL,ZESTRIL) 40 MG tablet Take 1 tablet (40 mg total) by mouth daily.  . metoprolol (LOPRESSOR) 50 MG tablet Take 1 tablet (50 mg total) by mouth 2 (two) times daily.  . Multiple Vitamin (MULTIVITAMIN) tablet Take 1 tablet by mouth daily.    . pravastatin (PRAVACHOL) 20 MG tablet TAKE 1 TABLET BY MOUTH ONCE DAILY  . sitaGLIPtin (JANUVIA) 100 MG tablet Take 1 tablet (100 mg total) by mouth daily.  . vitamin B-12 (CYANOCOBALAMIN) 1000 MCG tablet Take 1,000 mcg by mouth daily.     No facility-administered encounter medications on file as of 04/03/2017.     Activities of Daily Living In your present state of health, do you have any difficulty performing the following activities: 04/03/2017 04/03/2017  Hearing? Y Y    Comment Has hearing aids but doesn't wear them -  Vision? N N  Comment wearing glasses. Dr.Groat yearly -  Difficulty concentrating or making decisions? N N  Walking or climbing stairs? N N  Dressing or bathing? N N  Doing errands, shopping? N N  Preparing Food and eating ? N -  Using the Toilet? N -  In the past six months, have you accidently leaked urine? N -  Do you have problems with loss of bowel control? N -  Managing your Medications? N -  Managing your Finances? N -  Housekeeping or managing your Housekeeping? N -  Some recent data might be hidden    Patient Care Team: Zola ButtonLowne Chase, Grayling CongressYvonne R, DO as PCP - General Iran OuchArida, Muhammad A, MD as Consulting Physician (Cardiology) Wendall StadeNishan, Peter C, MD as Consulting Physician (Cardiology) Ernesto RutherfordGroat, Robert, MD as Consulting Physician (Ophthalmology)    Assessment:    Physical assessment deferred to PCP.  Exercise Activities and Dietary recommendations Current Exercise Habits: The patient does not participate in regular exercise at present Diet (meal preparation, eat out, water intake, caffeinated beverages, dairy products, fruits and vegetables): well balanced :       Goal : Maintain current health  Fall Risk Fall Risk  04/03/2017 04/03/2017 01/11/2016 05/16/2015 04/19/2014  Falls in the past year? No No No No No   Depression Screen PHQ 2/9 Scores 04/03/2017 04/03/2017 01/11/2016 05/16/2015  PHQ - 2 Score 0 0 0 0     Cognitive Function MMSE - Mini Mental State Exam 04/03/2017 01/11/2016  Orientation to time 5 5  Orientation to Place 5 5  Registration 3 3  Attention/ Calculation 5 5  Recall 2 3  Language- name 2 objects 2 2  Language- repeat 1 1  Language- follow 3 step command 3 3  Language- read & follow direction 1 1  Write a sentence 1 1  Copy design 1 1  Total score 29 30        Immunization History  Administered Date(s) Administered  . Influenza Split 03/20/2011, 03/31/2012  . Influenza Whole 03/04/2008,  03/28/2009, 03/16/2010  . Influenza, High Dose Seasonal PF 04/19/2014, 05/16/2015, 02/13/2016, 04/03/2017  . Pneumococcal Conjugate-13 12/21/2013  . Pneumococcal Polysaccharide-23 03/10/1997, 01/11/2016  . Tdap 04/16/2011  . Zoster 05/14/2007   Screening Tests Health Maintenance  Topic Date Due  . HEMOGLOBIN A1C  03/29/2017  . MAMMOGRAM  07/12/2017  . OPHTHALMOLOGY EXAM  03/11/2018  . FOOT EXAM  04/03/2018  . TETANUS/TDAP  04/15/2021  . INFLUENZA VACCINE  Completed  .  DEXA SCAN  Completed  . PNA vac Low Risk Adult  Completed      Plan:   Follow up with PCP as directed  Continue to eat heart healthy diet (full of fruits, vegetables, whole grains, lean protein, water--limit salt, fat, and sugar intake) and increase physical activity as tolerated.  Continue doing brain stimulating activities (puzzles, reading, adult coloring books, staying active) to keep memory sharp.   Bring a copy of your living will and/or healthcare power of attorney to your next office visit.   I have personally reviewed and noted the following in the patient's chart:   . Medical and social history . Use of alcohol, tobacco or illicit drugs  . Current medications and supplements . Functional ability and status . Nutritional status . Physical activity . Advanced directives . List of other physicians . Hospitalizations, surgeries, and ER visits in previous 12 months . Vitals . Screenings to include cognitive, depression, and falls . Referrals and appointments  In addition, I have reviewed and discussed with patient certain preventive protocols, quality metrics, and best practice recommendations. A written personalized care plan for preventive services as well as general preventive health recommendations were provided to patient.     [place]on Gully, [place]  04/03/2017

## 2017-07-14 ENCOUNTER — Other Ambulatory Visit: Payer: Self-pay | Admitting: Family Medicine

## 2017-07-14 DIAGNOSIS — I10 Essential (primary) hypertension: Secondary | ICD-10-CM

## 2017-07-22 ENCOUNTER — Ambulatory Visit: Payer: Medicare Other | Admitting: Cardiovascular Disease

## 2017-07-22 ENCOUNTER — Encounter: Payer: Self-pay | Admitting: Cardiovascular Disease

## 2017-07-22 VITALS — BP 152/70 | HR 69 | Ht 60.0 in | Wt 129.0 lb

## 2017-07-22 DIAGNOSIS — E785 Hyperlipidemia, unspecified: Secondary | ICD-10-CM

## 2017-07-22 DIAGNOSIS — I739 Peripheral vascular disease, unspecified: Secondary | ICD-10-CM

## 2017-07-22 DIAGNOSIS — I10 Essential (primary) hypertension: Secondary | ICD-10-CM

## 2017-07-22 NOTE — Patient Instructions (Signed)

## 2017-07-22 NOTE — Progress Notes (Signed)
Cardiology Office Note   Date:  07/22/2017   ID:  Emily Daniel, DOB [birth year], MRN [id number]  PCP:  Donato Schultz, DO  Cardiologist:  Dr. Eden Emms  Chief Complaint  Patient presents with  . Follow-up    no concerns today      History of Present Illness: Emily Daniel is a 84 y.o. female who presents for a follow-up visit regarding peripheral arterial disease. She has known history of CAD status post CABG and mitral valve repair in 2015, HTN, HL, diabetes, PAD, AVMs, prior tobacco abuse.  She is known to have peripheral arterial disease with ABI of 0.4 on the right and normal on the left. This is due to inflow disease with occluded right common iliac artery and significant left common iliac artery stenosis.  She was treated medically given that her claudication was not lifestyle limiting.  She continues to report stable claudication overall.  She is able to walk to her mailbox which is about 75 feet away without having to stop.  No chest pain or shortness of breath.  No lower extremity ulceration.  Past Medical History:  Diagnosis Date  . Anxiety   . Atrial fibrillation (HCC)    post op >>> req'd Amio and Coumadin  . AVM (arteriovenous malformation)   . CAD (coronary artery disease)    a. NSTEMI 10/2013 >>> LHC (10/2013):  Distal left main 90%, proximal LAD 30%, mid LAD 20%, ostial circumflex 99%, then 80%, mid RCA 99%, EF 30-35%, 3-4+ MR; proximal right iliac occluded, right renal 50%, ostial left iliac 70% >>> CABG/MV repair  . Cataract   . CHF (congestive heart failure) (HCC)    Dr. Dorris Fetch following  . Chronic systolic heart failure (HCC)   . Diabetes mellitus, type 2 (HCC)   . Hemorrhoids   . Hx of echocardiogram    Echo (9/15):  Mild LVH, EF 40-45%, inf AK, Gr 1 DD, trivial AI, MV repair ok (mild MS, mild MR), mild LAE, PASP 32 mmHg.  [name] Hyperlipidemia   . Hypertension   . Ischemic cardiomyopathy    a. Echo (11/08/13):  EF 30-35%, diffuse hypokinesis,  severe MR, mild LAE, PASP 48 mm Hg  . Mitral regurgitation    a. s/p MV repair at time of CABG  . Osteoporosis   . PVD (peripheral vascular disease) (HCC)   . Vitamin B12 deficiency     Past Surgical History:  Procedure Laterality Date  . ABDOMINAL HYSTERECTOMY     1969  . APPENDECTOMY     1945  . CORONARY ARTERY BYPASS GRAFT  1990, 1995, 1998  . HEMORRHOID SURGERY     1959  . LEFT AND RIGHT HEART CATHETERIZATION WITH CORONARY ANGIOGRAM N/A 11/10/2013   Procedure: LEFT AND RIGHT HEART CATHETERIZATION WITH CORONARY ANGIOGRAM;  Surgeon: Lennette Bihari, MD;  Location: [facility] CATH LAB;  Service: Cardiovascular;  Laterality: N/A;  . MITRAL VALVE REPLACEMENT (MVR)/CORONARY ARTERY BYPASS GRAFTING (CABG) N/A 11/11/2013   Procedure: MITRAL VALVE Ring repair (MVR)/CORONARY ARTERY BYPASS GRAFTING (CABG) times three using left internal mammary and left sphenous vein.;  Surgeon: Loreli Slot, MD;  Location: [facility] OR;  Service: Open Heart Surgery;  Laterality: N/A;  Coronary artery bypass graft times three using left internal mammary artery and left saphenous leg vein using endoscope.  Exploration of right leg.      Current Outpatient Medications  Medication Sig Dispense Refill  . Aspirin (ECOTRIN LOW STRENGTH PO) Take by mouth.    [name]  Calcium Carbonate-Vitamin D (CALCIUM-VITAMIN D) 600-200 MG-UNIT Daniel Take 1 capsule by mouth 2 (two) times daily.     Emily Daniel Take 1 capsule by mouth daily as needed (for constipation).     . ferrous sulfate 325 (65 FE) MG tablet Take 325 mg by mouth 2 (two) times daily with a meal.     . lisinopril (PRINIVIL,ZESTRIL) 40 MG tablet Take 1 tablet (40 mg total) by mouth daily. 90 tablet 3  . metoprolol (LOPRESSOR) 50 MG tablet Take 1 tablet (50 mg total) by mouth 2 (two) times daily. 180 tablet 3  . Multiple Vitamin (MULTIVITAMIN) tablet Take 1 tablet by mouth daily.      . pravastatin (PRAVACHOL) 20 MG tablet TAKE 1 TABLET BY MOUTH  ONCE DAILY 30 tablet 3  . sitaGLIPtin (JANUVIA) 100 MG tablet Take 1 tablet (100 mg total) by mouth daily. 30 tablet 5  . vitamin B-12 (CYANOCOBALAMIN) 1000 MCG tablet Take 1,000 mcg by mouth daily.       No current facility-administered medications for this visit.     Allergies:   Meperidine hcl; Sulfonamide derivatives; and Demerol [meperidine]    Social History:  The patient  reports that she quit smoking about 33 years ago. Her smoking use included cigarettes. She has a 36.00 pack-year smoking history. she has never used smokeless tobacco. She reports that she does not drink alcohol or use drugs.   Family History:  The patient's family history includes Cancer in her brother; Cervical cancer in her mother; Hypertension in her other and sister; Stroke in her other.    ROS:  Please see the history of present illness.   Otherwise, review of systems are positive for none.   All other systems are reviewed and negative.    PHYSICAL EXAM: VS:  BP (!) 152/70   Pulse 69   Ht 5' (1.524 m)   Wt 129 lb (58.5 kg)   BMI 25.19 kg/m  , BMI Body mass index is 25.19 kg/m. GEN: Well nourished, well developed, in no acute distress  HEENT: normal  Neck: no JVD, carotid bruits, or masses Cardiac: RRR; no rubs, or gallops,no edema . 2/6 systolic ejection murmur in the aortic area Respiratory:  clear to auscultation bilaterally, normal work of breathing GI: soft, nontender, nondistended, + BS MS: no deformity or atrophy  Skin: warm and dry, no rash Neuro:  Strength and sensation are intact Psych: euthymic mood, full affect Vascular: Femoral pulses +1 on the right and +2 on the left. Distal pulses are not palpable.  Bluish discoloration of toes bilaterally which is chronic.   EKG:  EKG is ordered today. EKG showed sinus rhythm with first-degree AV block and possible old inferior infarct.   Recent Labs: 10/31/2016: Hemoglobin 9.9; Platelets 191.0 04/03/2017: ALT 11; BUN 20; Creatinine, Ser 1.19;  Potassium 4.2; Sodium 143    Lipid Panel    Component Value Date/Time   CHOL 164 04/03/2017 1023   TRIG 102.0 04/03/2017 1023   HDL 46.80 04/03/2017 1023   CHOLHDL 4 04/03/2017 1023   VLDL 20.4 04/03/2017 1023   LDLCALC 97 04/03/2017 1023   LDLDIRECT 156.0 03/13/2009 0000      Wt Readings from Last 3 Encounters:  07/22/17 129 lb (58.5 kg)  04/03/17 127 lb (57.6 kg)  04/03/17 127 lb (57.6 kg)      No flowsheet data found.    ASSESSMENT AND PLAN:  1.  Peripheral arterial disease: Known inflow disease worse on  the right side.  The patient continues to have stable claudication which is not lifestyle limiting.  She has no rest pain and no lower extremity ulceration.  Continue medical therapy.  2. Essential hypertension: Blood pressure is reasonably controlled for age.  3.  Hyperlipidemia: Currently on pravastatin.   Disposition:   FU with me in 1 year  Signed,  Lorine Bears, MD  07/22/2017 10:26 AM    [facility]

## 2017-08-17 ENCOUNTER — Other Ambulatory Visit: Payer: Self-pay | Admitting: Family Medicine

## 2017-08-25 ENCOUNTER — Other Ambulatory Visit: Payer: Self-pay | Admitting: Cardiovascular Disease

## 2017-08-25 DIAGNOSIS — I10 Essential (primary) hypertension: Secondary | ICD-10-CM

## 2017-08-25 NOTE — Telephone Encounter (Signed)
REFILL 

## 2017-08-25 NOTE — Telephone Encounter (Signed)
Refill Request.  

## 2017-09-24 ENCOUNTER — Other Ambulatory Visit: Payer: Self-pay | Admitting: Family Medicine

## 2017-09-24 DIAGNOSIS — IMO0002 Reserved for concepts with insufficient information to code with codable children: Secondary | ICD-10-CM

## 2017-09-24 DIAGNOSIS — E1151 Type 2 diabetes mellitus with diabetic peripheral angiopathy without gangrene: Secondary | ICD-10-CM

## 2017-09-24 DIAGNOSIS — E1165 Type 2 diabetes mellitus with hyperglycemia: Principal | ICD-10-CM

## 2017-10-12 ENCOUNTER — Other Ambulatory Visit: Payer: Self-pay | Admitting: Family Medicine

## 2017-10-12 DIAGNOSIS — I10 Essential (primary) hypertension: Secondary | ICD-10-CM

## 2017-10-31 ENCOUNTER — Telehealth: Payer: Self-pay | Admitting: *Deleted

## 2017-10-31 NOTE — Telephone Encounter (Signed)
Received Physician Orders from [facility]; forwarded to provider/SLS 06/07 

## 2017-11-17 ENCOUNTER — Other Ambulatory Visit: Payer: Self-pay | Admitting: Family Medicine

## 2017-11-17 DIAGNOSIS — I6523 Occlusion and stenosis of bilateral carotid arteries: Secondary | ICD-10-CM

## 2017-11-24 ENCOUNTER — Ambulatory Visit (HOSPITAL_COMMUNITY)
Admission: RE | Admit: 2017-11-24 | Discharge: 2017-11-24 | Disposition: A | Discharge status: Home / Self Care | Payer: Medicare Other | Source: Ambulatory Visit | Attending: Cardiovascular Disease | Admitting: Cardiovascular Disease

## 2017-11-24 DIAGNOSIS — I6523 Occlusion and stenosis of bilateral carotid arteries: Secondary | ICD-10-CM | POA: Diagnosis not present

## 2017-11-24 DIAGNOSIS — I10 Essential (primary) hypertension: Secondary | ICD-10-CM | POA: Insufficient documentation

## 2017-11-24 DIAGNOSIS — Z87891 Personal history of nicotine dependence: Secondary | ICD-10-CM | POA: Diagnosis not present

## 2017-11-24 DIAGNOSIS — E1151 Type 2 diabetes mellitus with diabetic peripheral angiopathy without gangrene: Secondary | ICD-10-CM | POA: Diagnosis not present

## 2017-11-24 DIAGNOSIS — I2510 Atherosclerotic heart disease of native coronary artery without angina pectoris: Secondary | ICD-10-CM | POA: Diagnosis not present

## 2017-11-24 DIAGNOSIS — E785 Hyperlipidemia, unspecified: Secondary | ICD-10-CM | POA: Insufficient documentation

## 2017-11-26 NOTE — Progress Notes (Signed)
Patient ID: Emily Daniel, female   DOB: 08/10/1929, 84 y.o.   MRN: [id number]  Primary cardiologist: Dr. Eden Emms  84 y.o. hisotry of CAD/CABG and MV repair 2015 This was complicated by distal emboli to toes, PAF. Significant PVD followed by Dr Kirke Corin with known inflow disease on right total chronic iliac occlusion She has residual LICA stenosis  60-79%    CABG with mitral valve repair as well as closure of PFO by Dr. Dorris Fetch. 11/13/13  PROCEDURE: Median sternotomy, extracorporeal circulation, coronary artery bypass grafting x3 (left internal mammary artery to left anterior descending, saphenous vein graft to obtuse marginal 1, saphenous vein graft to distal right coronary), mitral valve repair with Sorin memo 3D 30 mm annuloplasty ring, serial # [id number], closure of patent foramen ovale.  Claudication is stable with no resting pain  No angina   Allergies  Allergen Reactions  . Meperidine Hcl Nausea And Vomiting  . Sulfonamide Derivatives Nausea And Vomiting  . Demerol [Meperidine]     Unknown per pt      Current Outpatient Medications on File Prior to Visit  Medication Sig Dispense Refill  . Aspirin (ECOTRIN LOW STRENGTH PO) Take by mouth.    . Calcium Carbonate-Vitamin D (CALCIUM-VITAMIN D) 600-200 MG-UNIT CAPS Take 1 capsule by mouth 2 (two) times daily.     [name] Sodium 30-100 MG CAPS Take 1 capsule by mouth daily as needed (for constipation).     . ferrous sulfate 325 (65 FE) MG tablet Take 325 mg by mouth 2 (two) times daily with a meal.     . JANUVIA 100 MG tablet TAKE 1 TABLET BY MOUTH ONCE DAILY 30 tablet 5  . lisinopril (PRINIVIL,ZESTRIL) 40 MG tablet TAKE 1 TABLET BY MOUTH ONCE DAILY 90 tablet 3  . metoprolol tartrate (LOPRESSOR) 50 MG tablet TAKE 1 TABLET BY MOUTH TWICE DAILY 180 tablet 3  . Multiple Vitamin (MULTIVITAMIN) tablet Take 1 tablet by mouth daily.      . pravastatin (PRAVACHOL) 20 MG tablet TAKE 1 TABLET BY MOUTH ONCE DAILY 30 tablet 3    . vitamin B-12 (CYANOCOBALAMIN) 1000 MCG tablet Take 1,000 mcg by mouth daily.       No current facility-administered medications on file prior to visit.      Past Medical History:  Diagnosis Date  . Anxiety   . Atrial fibrillation (HCC)    post op >>> req'd Amio and Coumadin  . AVM (arteriovenous malformation)   . CAD (coronary artery disease)    a. NSTEMI 10/2013 >>> LHC (10/2013):  Distal left main 90%, proximal LAD 30%, mid LAD 20%, ostial circumflex 99%, then 80%, mid RCA 99%, EF 30-35%, 3-4+ MR; proximal right iliac occluded, right renal 50%, ostial left iliac 70% >>> CABG/MV repair  . Cataract   . CHF (congestive heart failure) (HCC)    Dr. Dorris Fetch following  . Chronic systolic heart failure (HCC)   . Diabetes mellitus, type 2 (HCC)   . Hemorrhoids   . Hx of echocardiogram    Echo (9/15):  Mild LVH, EF 40-45%, inf AK, Gr 1 DD, trivial AI, MV repair ok (mild MS, mild MR), mild LAE, PASP 32 mmHg.  [name] Hyperlipidemia   . Hypertension   . Ischemic cardiomyopathy    a. Echo (11/08/13):  EF 30-35%, diffuse hypokinesis, severe MR, mild LAE, PASP 48 mm Hg  . Mitral regurgitation    a. s/p MV repair at time of CABG  . Osteoporosis   . PVD (peripheral vascular  disease) (HCC)   . Vitamin B12 deficiency      Past Surgical History:  Procedure Laterality Date  . ABDOMINAL HYSTERECTOMY     1969  . APPENDECTOMY     1945  . CORONARY ARTERY BYPASS GRAFT  1990, 1995, 1998  . HEMORRHOID SURGERY     1959  . LEFT AND RIGHT HEART CATHETERIZATION WITH CORONARY ANGIOGRAM N/A 11/10/2013   Procedure: LEFT AND RIGHT HEART CATHETERIZATION WITH CORONARY ANGIOGRAM;  Surgeon: Lennette Biharihomas A Kelly, MD;  [facility];  Service: Cardiovascular;  Laterality: N/A;  . MITRAL VALVE REPLACEMENT (MVR)/CORONARY ARTERY BYPASS GRAFTING (CABG) N/A 11/11/2013   Procedure: MITRAL VALVE Ring repair (MVR)/CORONARY ARTERY BYPASS GRAFTING (CABG) times three using left internal mammary and left sphenous vein.;   Surgeon: Loreli SlotSteven C Hendrickson, MD;  Location: [facility] OR;  Service: Open Heart Surgery;  Laterality: N/A;  Coronary artery bypass graft times three using left internal mammary artery and left saphenous leg vein using endoscope.  Exploration of right leg.      Family History  Problem Relation Age of Onset  . Cervical cancer Mother   . Cancer Brother        renal ...x3??  [name]. Hypertension Other   . Stroke Other   . Hypertension Sister   . Heart attack Neg Hx      Social History   Socioeconomic History  . Marital status: Widowed    Spouse name: Not on file  . Number of children: Not on file  . Years of education: Not on file  . Highest education level: Not on file  Occupational History  . Occupation: retired    Associate Professormployer: RETIRED  Social Needs  . Financial resource strain: Not on file  . Food insecurity:    Worry: Not on file    Inability: Not on file  . Transportation needs:    Medical: Not on file    Non-medical: Not on file  Tobacco Use  . Smoking status: Former Smoker    Packs/day: 1.00    Years: 36.00    Pack years: 36.00    Types: Cigarettes    Last attempt to quit: 02/18/1984    Years since quitting: 33.8  . Smokeless tobacco: Never Used  Substance and Sexual Activity  . Alcohol use: No  . Drug use: No  . Sexual activity: Not Currently  Lifestyle  . Physical activity:    Days per week: Not on file    Minutes per session: Not on file  . Stress: Not on file  Relationships  . Social connections:    Talks on phone: Not on file    Gets together: Not on file    Attends religious service: Not on file    Active member of club or organization: Not on file    Attends meetings of clubs or organizations: Not on file    Relationship status: Not on file  . Intimate partner violence:    Fear of current or ex partner: Not on file    Emotionally abused: Not on file    Physically abused: Not on file    Forced sexual activity: Not on file  Other Topics Concern  . Not on  file  Social History Narrative   Exercise-- yard, house work    ECG:  SR rate 67  PR 236  2016  01/02/16  SR rate 69 PR 268    ROS A 10 point review of system was performed. It is negative other than that  mentioned in the history of present illness.   PHYSICAL EXAM  BP (!) 154/72   Pulse 85   Ht 5' (1.524 m)   Wt 125 lb 4 oz (56.8 kg)   SpO2 99%   BMI 24.46 kg/m   Affect appropriate Healthy:  appears stated age HEENT: normal Neck supple with no adenopathy JVP normal bilateral  bruits no thyromegaly Lungs clear with no wheezing and good diaphragmatic motion Heart:  S1/S2 no murmur, no rub, gallop or click PMI normal post sternotomy  Abdomen: benighn, BS positve, no tenderness, no AAA no bruit.  No HSM or HJR Poor distal pulses on right  No edema Neuro non-focal Skin warm and dry No muscular weakness    ASSESSMENT AND PLAN  CAD Stable with no angina and good activity level.  Continue medical Rx CABG 2015 MV Repair no residual murmur excellent result  SBE PVD stable RLE f/u Arida  No indication for angio at this time  Occluded right common iliac artery ABI on right in .40 range   Left bruit 60-79%  LICA f/u duplex July 2020   Chol  At goal  Lab Results  Component Value Date   LDLCALC 97 04/03/2017    HTN some white coat component continue current meds   F/U with Arida 6 months me in a year    [employer]

## 2017-11-28 ENCOUNTER — Ambulatory Visit: Payer: Medicare Other | Admitting: Cardiovascular Disease

## 2017-11-28 ENCOUNTER — Encounter: Payer: Self-pay | Admitting: Cardiovascular Disease

## 2017-11-28 VITALS — BP 154/72 | HR 85 | Ht 60.0 in | Wt 125.2 lb

## 2017-11-28 DIAGNOSIS — I739 Peripheral vascular disease, unspecified: Secondary | ICD-10-CM

## 2017-11-28 DIAGNOSIS — I6523 Occlusion and stenosis of bilateral carotid arteries: Secondary | ICD-10-CM

## 2017-11-28 DIAGNOSIS — Z1231 Encounter for screening mammogram for malignant neoplasm of breast: Secondary | ICD-10-CM | POA: Diagnosis not present

## 2017-11-28 DIAGNOSIS — M810 Age-related osteoporosis without current pathological fracture: Secondary | ICD-10-CM | POA: Diagnosis not present

## 2017-11-28 LAB — HM DEXA SCAN

## 2017-11-28 LAB — HM MAMMOGRAPHY

## 2017-11-28 NOTE — Patient Instructions (Addendum)
Medication Instructions:  Your physician recommends that you continue on your current medications as directed. Please refer to the Current Medication list given to you today.  Labwork: NONE  Testing/Procedures: Your physician has requested that you have a carotid duplex. This test is an ultrasound of the carotid arteries in your neck. It looks at blood flow through these arteries that supply the brain with blood. Allow one hour for this exam. There are no restrictions or special instructions.  Your physician has requested that you have a lower extremity arterial duplex with ABIs. This test is an ultrasound of the arteries in the legs. It looks at arterial blood flow in the legs. Allow one hour for Lower Arterial scans. There are no restrictions or special instructions.  Follow-Up: Your physician wants you to follow-up in: 6 months with Dr. Kirke CorinArida. You will receive a reminder letter in the mail two months in advance. If you don't receive a letter, please call our office to schedule the follow-up appointment.   If you need a refill on your cardiac medications before your next appointment, please call your pharmacy.

## 2017-12-04 ENCOUNTER — Telehealth: Payer: Self-pay | Admitting: *Deleted

## 2017-12-04 NOTE — Telephone Encounter (Signed)
Received Bone Density results from [facility]; forwarded to provider/SLS 07/11

## 2017-12-04 NOTE — Telephone Encounter (Signed)
Copied from CRM [phone]. Topic: General - Other >> Dec 04, 2017 11:05 AM Percival SpanishKennedy, Cheryl W wrote:  Lorain ChildesFyi,,  Pt call to say she told Dr Zola ButtonLowne-Chase that she had had already had her eye appt,  but she has not it is scheduled for Oct 29,2019

## 2017-12-04 NOTE — Telephone Encounter (Signed)
Just an FYI

## 2017-12-09 ENCOUNTER — Telehealth: Payer: Self-pay | Admitting: *Deleted

## 2017-12-09 NOTE — Telephone Encounter (Signed)
Received Bone Density results from [facility]; forwarded to provider/SLS 07/16  

## 2017-12-14 ENCOUNTER — Other Ambulatory Visit: Payer: Self-pay | Admitting: Family Medicine

## 2017-12-17 ENCOUNTER — Encounter: Payer: Self-pay | Admitting: *Deleted

## 2017-12-17 ENCOUNTER — Telehealth: Payer: Self-pay | Admitting: *Deleted

## 2017-12-17 ENCOUNTER — Encounter: Payer: Self-pay | Admitting: Family Medicine

## 2017-12-17 NOTE — Telephone Encounter (Signed)
Bone density showed osteoporosis.  Per Dr. Laury AxonLowne states:  Emily Daniel continue current treatment and weight bearing exercises and recheck in 2 years.    Patient notified.

## 2017-12-18 ENCOUNTER — Encounter: Payer: Self-pay | Admitting: Family Medicine

## 2017-12-18 ENCOUNTER — Ambulatory Visit (INDEPENDENT_AMBULATORY_CARE_PROVIDER_SITE_OTHER): Payer: Medicare Other | Admitting: Family Medicine

## 2017-12-18 VITALS — BP 136/76 | HR 60 | Temp 98.4°F | Resp 16 | Ht 60.0 in | Wt 125.4 lb

## 2017-12-18 DIAGNOSIS — I214 Non-ST elevation (NSTEMI) myocardial infarction: Secondary | ICD-10-CM

## 2017-12-18 DIAGNOSIS — E785 Hyperlipidemia, unspecified: Secondary | ICD-10-CM | POA: Diagnosis not present

## 2017-12-18 DIAGNOSIS — E1151 Type 2 diabetes mellitus with diabetic peripheral angiopathy without gangrene: Secondary | ICD-10-CM | POA: Diagnosis not present

## 2017-12-18 DIAGNOSIS — I10 Essential (primary) hypertension: Secondary | ICD-10-CM | POA: Diagnosis not present

## 2017-12-18 DIAGNOSIS — E1169 Type 2 diabetes mellitus with other specified complication: Secondary | ICD-10-CM | POA: Diagnosis not present

## 2017-12-18 DIAGNOSIS — IMO0002 Reserved for concepts with insufficient information to code with codable children: Secondary | ICD-10-CM

## 2017-12-18 DIAGNOSIS — E1165 Type 2 diabetes mellitus with hyperglycemia: Secondary | ICD-10-CM | POA: Diagnosis not present

## 2017-12-18 LAB — LIPID PANEL
CHOLESTEROL: 189 mg/dL (ref 0–200)
HDL: 44.8 mg/dL (ref >39.00)
LDL Cholesterol: 114 mg/dL — ABNORMAL HIGH (ref 0–99)
NonHDL: 144.56
TRIGLYCERIDES: 155 mg/dL — AB (ref 0.0–149.0)
Total CHOL/HDL Ratio: 4
VLDL: 31 mg/dL (ref 0.0–40.0)

## 2017-12-18 LAB — COMPREHENSIVE METABOLIC PANEL
ALBUMIN: 4.2 g/dL (ref 3.5–5.2)
ALK PHOS: 36 U/L — AB (ref 39–117)
ALT: 10 U/L (ref 0–35)
AST: 9 U/L (ref 0–37)
BUN: 28 mg/dL — AB (ref 6–23)
CALCIUM: 9.7 mg/dL (ref 8.4–10.5)
CO2: 25 mEq/L (ref 19–32)
CREATININE: 1.53 mg/dL — AB (ref 0.40–1.20)
Chloride: 109 mEq/L (ref 96–112)
GFR: 34.21 mL/min — ABNORMAL LOW (ref >60.00)
Glucose, Bld: 106 mg/dL — ABNORMAL HIGH (ref 70–99)
POTASSIUM: 4.4 meq/L (ref 3.5–5.1)
SODIUM: 143 meq/L (ref 135–145)
TOTAL PROTEIN: 6.9 g/dL (ref 6.0–8.3)
Total Bilirubin: 0.6 mg/dL (ref 0.2–1.2)

## 2017-12-18 LAB — HEMOGLOBIN A1C: Hgb A1c MFr Bld: 5.8 % (ref 4.6–6.5)

## 2017-12-18 MED ORDER — PRAVASTATIN SODIUM 20 MG PO TABS: 20.0000 mg | ORAL_TABLET | Freq: Every day | ORAL | 1 refills | Status: DC

## 2017-12-18 NOTE — Patient Instructions (Signed)

## 2017-12-18 NOTE — Assessment & Plan Note (Signed)
hgba1c to be checked, minimize simple carbs. Increase exercise as tolerated. Continue current meds  

## 2017-12-18 NOTE — Assessment & Plan Note (Signed)
Well controlled, no changes to meds. Encouraged heart healthy diet such as the DASH diet and exercise as tolerated.  °

## 2017-12-18 NOTE — Assessment & Plan Note (Signed)
Per cardiology 

## 2017-12-18 NOTE — Progress Notes (Addendum)
Patient ID: Emily Daniel Aina, female    DOB: 1929/08/15  Age: 84 y.o. MRN: [id number]    Subjective:  Subjective  HPI Emily Daniel [place] presents for f/u dm, htn and cholesterol  HPI HYPERTENSION   Blood pressure range-not checking   Chest pain- no      Dyspnea- no Lightheadedness- no   Edema- no  Other side effects - no   Medication compliance: good Low salt diet- yes    DIABETES    Blood Sugar ranges-good per pt   Polyuria- no New Visual problems- no  Hypoglycemic symptoms- no  Other side effects-no Medication compliance - good Last eye exam-due Foot exam- today   HYPERLIPIDEMIA  Medication compliance- good RUQ pain- no  Muscle aches- no Other side effects-no      .[name].  Review of Systems  Constitutional: Negative for appetite change, diaphoresis, fatigue and unexpected weight change.  Eyes: Negative for pain, redness and visual disturbance.  Respiratory: Negative for cough, chest tightness, shortness of breath and wheezing.   Cardiovascular: Negative for chest pain, palpitations and leg swelling.  Endocrine: Negative for cold intolerance, heat intolerance, polydipsia, polyphagia and polyuria.  Genitourinary: Negative for difficulty urinating, dysuria and frequency.  Neurological: Negative for dizziness, light-headedness, numbness and headaches.    History Past Medical History:  Diagnosis Date  . Anxiety   . Atrial fibrillation (HCC)    post op >>> req'd Amio and Coumadin  . AVM (arteriovenous malformation)   . CAD (coronary artery disease)    a. NSTEMI 10/2013 >>> LHC (10/2013):  Distal left main 90%, proximal LAD 30%, mid LAD 20%, ostial circumflex 99%, then 80%, mid RCA 99%, EF 30-35%, 3-4+ MR; proximal right iliac occluded, right renal 50%, ostial left iliac 70% >>> CABG/MV repair  . Cataract   . CHF (congestive heart failure) (HCC)    Dr. Dorris FetchHendrickson following  . Chronic systolic heart failure (HCC)   . Diabetes mellitus, type 2 (HCC)   . Hemorrhoids   .  Hx of echocardiogram    Echo (9/15):  Mild LVH, EF 40-45%, inf AK, Gr 1 DD, trivial AI, MV repair ok (mild MS, mild MR), mild LAE, PASP 32 mmHg.  [name]. Hyperlipidemia   . Hypertension   . Ischemic cardiomyopathy    a. Echo (11/08/13):  EF 30-35%, diffuse hypokinesis, severe MR, mild LAE, PASP 48 mm Hg  . Mitral regurgitation    a. s/p MV repair at time of CABG  . Osteoporosis   . PVD (peripheral vascular disease) (HCC)   . Vitamin B12 deficiency     She has a past surgical history that includes Appendectomy; Abdominal hysterectomy; Hemorrhoid surgery; Mitral valve replacement (mvr)/coronary artery bypass grafting (cabg) (N/A, 11/11/2013); left and right heart catheterization with coronary angiogram (N/A, 11/10/2013); and Coronary artery bypass graft (1990, 1995, 1998).   Her family history includes Cancer in her brother; Cervical cancer in her mother; Hypertension in her other and sister; Stroke in her other.She reports that she quit smoking about 33 years ago. Her smoking use included cigarettes. She has a 36.00 pack-year smoking history. She has never used smokeless tobacco. She reports that she does not drink alcohol or use drugs.  Current Outpatient Medications on File Prior to Visit  Medication Sig Dispense Refill  . Aspirin (ECOTRIN LOW STRENGTH PO) Take by mouth.    . Calcium Carbonate-Vitamin D (CALCIUM-VITAMIN D) 600-200 MG-UNIT CAPS Take 1 capsule by mouth 2 (two) times daily.     [name]. Casanthranol-Docusate Sodium 30-100 MG  CAPS Take 1 capsule by mouth daily as needed (for constipation).     . ferrous sulfate 325 (65 FE) MG tablet Take 325 mg by mouth 2 (two) times daily with a meal.     . JANUVIA 100 MG tablet TAKE 1 TABLET BY MOUTH ONCE DAILY 30 tablet 5  . lisinopril (PRINIVIL,ZESTRIL) 40 MG tablet TAKE 1 TABLET BY MOUTH ONCE DAILY 90 tablet 3  . metoprolol tartrate (LOPRESSOR) 50 MG tablet TAKE 1 TABLET BY MOUTH TWICE DAILY 180 tablet 3  . Multiple Vitamin (MULTIVITAMIN) tablet Take 1  tablet by mouth daily.      . vitamin B-12 (CYANOCOBALAMIN) 1000 MCG tablet Take 1,000 mcg by mouth daily.       No current facility-administered medications on file prior to visit.      Objective:  Objective  Physical Exam  Constitutional: She is oriented to person, place, and time. She appears well-developed and well-nourished.  HENT:  Head: Normocephalic and atraumatic.  Eyes: Conjunctivae and EOM are normal.  Neck: Normal range of motion. Neck supple. No JVD present. Carotid bruit is not present. No thyromegaly present.  Cardiovascular: Normal rate, regular rhythm and normal heart sounds.  No murmur heard. Pulmonary/Chest: Effort normal and breath sounds normal. No respiratory distress. She has no wheezes. She has no rales. She exhibits no tenderness.  Musculoskeletal: She exhibits no edema.  Neurological: She is alert and oriented to person, place, and time.  Psychiatric: She has a normal mood and affect.  Nursing note and vitals reviewed.  Diabetic Foot Exam - Simple   Simple Foot Form Diabetic Foot exam was performed with the following findings:  Yes 12/18/2017 11:35 AM  Visual Inspection No deformities, no ulcerations, no other skin breakdown bilaterally:  Yes Sensation Testing Pulse Check Posterior Tibialis and Dorsalis pulse intact bilaterally:  Yes Comments     BP 136/76 (BP Location: Right Arm, Cuff Size: Normal)   Pulse 60   Temp 98.4 F (36.9 C) (Oral)   Resp 16   Ht 5' (1.524 m)   Wt 125 lb 6.4 oz (56.9 kg)   SpO2 98%   BMI 24.49 kg/m  Wt Readings from Last 3 Encounters:  12/18/17 125 lb 6.4 oz (56.9 kg)  11/28/17 125 lb 4 oz (56.8 kg)  07/22/17 129 lb (58.5 kg)     Lab Results  Component Value Date   WBC 6.6 10/31/2016   HGB 9.9 (L) 10/31/2016   HCT 29.0 (L) 10/31/2016   PLT 191.0 10/31/2016   GLUCOSE 124 (H) 04/03/2017   CHOL 164 04/03/2017   TRIG 102.0 04/03/2017   HDL 46.80 04/03/2017   LDLDIRECT 156.0 03/13/2009   LDLCALC 97 04/03/2017     ALT 11 04/03/2017   AST 13 04/03/2017   NA 143 04/03/2017   K 4.2 04/03/2017   CL 109 04/03/2017   CREATININE 1.19 04/03/2017   BUN 20 04/03/2017   CO2 27 04/03/2017   TSH 0.942 11/17/2013   INR 2.5 05/02/2014   HGBA1C 5.0 04/03/2017   MICROALBUR 3.4 (H) 04/03/2017    No results found.   Assessment & Plan:  Plan  I am having Zelphia Cairo. Brugh maintain her Calcium-Vitamin D, multivitamin, vitamin B-12, Casanthranol-Docusate Sodium, ferrous sulfate, Aspirin (ECOTRIN LOW STRENGTH PO), lisinopril, JANUVIA, metoprolol tartrate, and pravastatin.  Meds ordered this encounter  Medications  . pravastatin (PRAVACHOL) 20 MG tablet    Sig: Take 1 tablet (20 mg total) by mouth daily.    Dispense:  90 tablet  Refill:  1    Problem List Items Addressed This Visit      Unprioritized   DM (diabetes mellitus) type II uncontrolled, periph vascular disorder (HCC)    hgba1c to be checked, minimize simple carbs. Increase exercise as tolerated. Continue current meds       Relevant Medications   pravastatin (PRAVACHOL) 20 MG tablet   Other Relevant Orders   Hemoglobin A1c   Comprehensive metabolic panel   Lipid panel   Essential hypertension    Well controlled, no changes to meds. Encouraged heart healthy diet such as the DASH diet and exercise as tolerated.       Relevant Medications   pravastatin (PRAVACHOL) 20 MG tablet   Other Relevant Orders   Hemoglobin A1c   Comprehensive metabolic panel   Lipid panel   Hyperlipidemia    Encouraged heart healthy diet, increase exercise, avoid trans fats, consider a krill oil cap daily      Relevant Medications   pravastatin (PRAVACHOL) 20 MG tablet   NSTEMI (non-ST elevated myocardial infarction) [facility])    Per cardiology      Relevant Medications   pravastatin (PRAVACHOL) 20 MG tablet    Other Visit Diagnoses    Hyperlipidemia associated with type 2 diabetes mellitus (HCC)    -  Primary   Relevant Medications   pravastatin  (PRAVACHOL) 20 MG tablet   Other Relevant Orders   Hemoglobin A1c   Comprehensive metabolic panel   Lipid panel      Follow-up: Return in about 6 months (around 06/20/2018), or if symptoms worsen or fail to improve, for annual exam, fasting.  Donato Schultz, DO

## 2017-12-18 NOTE — Assessment & Plan Note (Signed)
Encouraged heart healthy diet, increase exercise, avoid trans fats, consider a krill oil cap daily 

## 2017-12-22 ENCOUNTER — Other Ambulatory Visit: Payer: Self-pay

## 2017-12-22 DIAGNOSIS — R799 Abnormal finding of blood chemistry, unspecified: Secondary | ICD-10-CM

## 2018-01-05 ENCOUNTER — Other Ambulatory Visit (INDEPENDENT_AMBULATORY_CARE_PROVIDER_SITE_OTHER): Payer: Medicare Other

## 2018-01-05 DIAGNOSIS — R799 Abnormal finding of blood chemistry, unspecified: Secondary | ICD-10-CM | POA: Diagnosis not present

## 2018-01-05 LAB — BASIC METABOLIC PANEL
BUN: 24 mg/dL — ABNORMAL HIGH (ref 6–23)
CALCIUM: 9.6 mg/dL (ref 8.4–10.5)
CHLORIDE: 107 meq/L (ref 96–112)
CO2: 29 meq/L (ref 19–32)
CREATININE: 1.34 mg/dL — AB (ref 0.40–1.20)
GFR: 39.86 mL/min — ABNORMAL LOW (ref >60.00)
Glucose, Bld: 93 mg/dL (ref 70–99)
Potassium: 4.2 mEq/L (ref 3.5–5.1)
Sodium: 142 mEq/L (ref 135–145)

## 2018-01-08 ENCOUNTER — Other Ambulatory Visit: Payer: Self-pay | Admitting: Family Medicine

## 2018-01-12 ENCOUNTER — Telehealth: Payer: Self-pay

## 2018-01-12 NOTE — Telephone Encounter (Signed)
Author phoned pt. to relay BMP results. Pt. states she is drinking more water, but it "fills her up, and then I can't eat". Author suggested keeping track of the ounces she consumes, and drink a little at a time to avoid feeling full. Author reviewed thirst mechanism changes during aging. Pt. Verbalized understanding and stated should would try to drink more.

## 2018-01-12 NOTE — Telephone Encounter (Signed)
-----   Message from Donato SchultzYvonne R Lowne Chase, DO sent at 01/10/2018  2:00 PM EDT ----- Pt bun/ cr elevated ----  Is she drinking enough water ?

## 2018-03-24 DIAGNOSIS — Z961 Presence of intraocular lens: Secondary | ICD-10-CM | POA: Diagnosis not present

## 2018-03-24 DIAGNOSIS — H04123 Dry eye syndrome of bilateral lacrimal glands: Secondary | ICD-10-CM | POA: Diagnosis not present

## 2018-03-24 DIAGNOSIS — E119 Type 2 diabetes mellitus without complications: Secondary | ICD-10-CM | POA: Diagnosis not present

## 2018-03-24 DIAGNOSIS — H353131 Nonexudative age-related macular degeneration, bilateral, early dry stage: Secondary | ICD-10-CM | POA: Diagnosis not present

## 2018-03-24 DIAGNOSIS — H40053 Ocular hypertension, bilateral: Secondary | ICD-10-CM | POA: Diagnosis not present

## 2018-04-07 ENCOUNTER — Encounter

## 2018-04-07 ENCOUNTER — Ambulatory Visit (INDEPENDENT_AMBULATORY_CARE_PROVIDER_SITE_OTHER): Payer: Medicare Other | Admitting: Family Medicine

## 2018-04-07 ENCOUNTER — Encounter: Payer: Self-pay | Admitting: Family Medicine

## 2018-04-07 VITALS — BP 180/55 | HR 57 | Temp 97.5°F | Resp 16 | Ht 60.0 in | Wt 128.4 lb

## 2018-04-07 DIAGNOSIS — E785 Hyperlipidemia, unspecified: Secondary | ICD-10-CM

## 2018-04-07 DIAGNOSIS — I10 Essential (primary) hypertension: Secondary | ICD-10-CM

## 2018-04-07 DIAGNOSIS — E118 Type 2 diabetes mellitus with unspecified complications: Secondary | ICD-10-CM

## 2018-04-07 DIAGNOSIS — Z0000 Encounter for general adult medical examination without abnormal findings: Secondary | ICD-10-CM

## 2018-04-07 DIAGNOSIS — Z23 Encounter for immunization: Secondary | ICD-10-CM | POA: Diagnosis not present

## 2018-04-07 NOTE — Progress Notes (Signed)
Subjective:     Emily Daniel is a 84 y.o. female and is here for a comprehensive physical exam. The patient reports no problems.  HYPERTENSION   Blood pressure range-140-150/ ?  Chest pain- no      Dyspnea- no Lightheadedness- no   Edema- no  Other side effects - no   Medication compliance: good Low salt diet- yes    DIABETES    Blood Sugar ranges-101-110  Polyuria- no New Visual problems- no  Hypoglycemic symptoms- no  Other side effects-no Medication compliance - good Last eye exam- due Foot exam- today   HYPERLIPIDEMIA  Medication compliance- good RUQ pain- no  Muscle aches- no Other side effects-no   ROS See HPI above   PMH Smoking Status noted     Social History   Socioeconomic History  . Marital status: Widowed    Spouse name: Not on file  . Number of children: Not on file  . Years of education: Not on file  . Highest education level: Not on file  Occupational History  . Occupation: retired    Associate Professormployer: RETIRED  Social Needs  . Financial resource strain: Not on file  . Food insecurity:    Worry: Not on file    Inability: Not on file  . Transportation needs:    Medical: Not on file    Non-medical: Not on file  Tobacco Use  . Smoking status: Former Smoker    Packs/day: 1.00    Years: 36.00    Pack years: 36.00    Types: Cigarettes    Last attempt to quit: 02/18/1984    Years since quitting: 34.1  . Smokeless tobacco: Never Used  Substance and Sexual Activity  . Alcohol use: No  . Drug use: No  . Sexual activity: Not Currently  Lifestyle  . Physical activity:    Days per week: Not on file    Minutes per session: Not on file  . Stress: Not on file  Relationships  . Social connections:    Talks on phone: Not on file    Gets together: Not on file    Attends religious service: Not on file    Active member of club or organization: Not on file    Attends meetings of clubs or organizations: Not on file    Relationship status: Not on file   . Intimate partner violence:    Fear of current or ex partner: Not on file    Emotionally abused: Not on file    Physically abused: Not on file    Forced sexual activity: Not on file  Other Topics Concern  . Not on file  Social History Narrative   Exercise-- yard, house work   Health Maintenance  Topic Date Due  . INFLUENZA VACCINE  12/25/2017  . OPHTHALMOLOGY EXAM  03/11/2018  . HEMOGLOBIN A1C  06/20/2018  . MAMMOGRAM  11/29/2018  . FOOT EXAM  12/19/2018  . DEXA SCAN  11/29/2019  . TETANUS/TDAP  04/15/2021  . PNA vac Low Risk Adult  Completed    The following portions of the patient's history were reviewed and updated as appropriate:  She  has a past medical history of Anxiety, Atrial fibrillation (HCC), AVM (arteriovenous malformation), CAD (coronary artery disease), Cataract, CHF (congestive heart failure) (HCC), Chronic systolic heart failure (HCC), Diabetes mellitus, type 2 (HCC), Hemorrhoids, echocardiogram, Hyperlipidemia, Hypertension, Ischemic cardiomyopathy, Mitral regurgitation, Osteoporosis, PVD (peripheral vascular disease) (HCC), and Vitamin B12 deficiency. She does not have any pertinent problems on  file. She  has a past surgical history that includes Appendectomy; Abdominal hysterectomy; Hemorrhoid surgery; Mitral valve replacement (mvr)/coronary artery bypass grafting (cabg) (N/A, 11/11/2013); left and right heart catheterization with coronary angiogram (N/A, 11/10/2013); and Coronary artery bypass graft (1990, 1995, 1998). Her family history includes Cancer in her brother; Cervical cancer in her mother; Hypertension in her other and sister; Stroke in her other. She  reports that she quit smoking about 34 years ago. Her smoking use included cigarettes. She has a 36.00 pack-year smoking history. She has never used smokeless tobacco. She reports that she does not drink alcohol or use drugs. She has a current medication list which includes the following prescription(s):  aspirin, calcium-vitamin d, casanthranol-docusate sodium, ferrous sulfate, januvia, lisinopril, metoprolol tartrate, multivitamin, pravastatin, and vitamin b-12. Current Outpatient Medications on File Prior to Visit  Medication Sig Dispense Refill  . Aspirin (ECOTRIN LOW STRENGTH PO) Take by mouth.    . Calcium Carbonate-Vitamin D (CALCIUM-VITAMIN D) 600-200 MG-UNIT CAPS Take 1 capsule by mouth 2 (two) times daily.     [name] Sodium 30-100 MG CAPS Take 1 capsule by mouth daily as needed (for constipation).     . ferrous sulfate 325 (65 FE) MG tablet Take 325 mg by mouth 2 (two) times daily with a meal.     . JANUVIA 100 MG tablet TAKE 1 TABLET BY MOUTH ONCE DAILY 30 tablet 5  . lisinopril (PRINIVIL,ZESTRIL) 40 MG tablet TAKE 1 TABLET BY MOUTH ONCE DAILY 90 tablet 3  . metoprolol tartrate (LOPRESSOR) 50 MG tablet TAKE 1 TABLET BY MOUTH TWICE DAILY 180 tablet 3  . Multiple Vitamin (MULTIVITAMIN) tablet Take 1 tablet by mouth daily.      . pravastatin (PRAVACHOL) 20 MG tablet Take 1 tablet (20 mg total) by mouth daily. 90 tablet 1  . vitamin B-12 (CYANOCOBALAMIN) 1000 MCG tablet Take 1,000 mcg by mouth daily.       No current facility-administered medications on file prior to visit.    She is allergic to meperidine hcl; sulfonamide derivatives; and demerol [meperidine]..  Review of Systems Review of Systems  Constitutional: Negative for activity change, appetite change and fatigue.  HENT: Negative for hearing loss, congestion, tinnitus and ear discharge.  dentist q40m Eyes: Negative for visual disturbance (see optho q1y --  Respiratory: Negative for cough, chest tightness and shortness of breath.   Cardiovascular: Negative for chest pain, palpitations and leg swelling.  Gastrointestinal: Negative for abdominal pain, diarrhea, constipation and abdominal distention.  Genitourinary: Negative for urgency, frequency, decreased urine volume and difficulty urinating.   Musculoskeletal: Negative for back pain, arthralgias and gait problem.  Skin: Negative for color change, pallor and rash.  Neurological: Negative for dizziness, light-headedness, numbness and headaches.  Hematological: Negative for adenopathy. Does not bruise/bleed easily.  Psychiatric/Behavioral: Negative for suicidal ideas, confusion, sleep disturbance, self-injury, dysphoric mood, decreased concentration and agitation.       Objective:    BP (!) 180/55   Pulse (!) 57   Temp (!) 97.5 F (36.4 C) (Oral)   Resp 16   Ht 5' (1.524 m)   Wt 128 lb 6.4 oz (58.2 kg)   SpO2 100%   BMI 25.08 kg/m  General appearance: alert, cooperative, appears stated age and no distress Head: Normocephalic, without obvious abnormality, atraumatic Eyes: conjunctivae/corneas clear. PERRL, EOM's intact. Fundi benign. Ears: normal TM's and external ear canals both ears Nose: Nares normal. Septum midline. Mucosa normal. No drainage or sinus tenderness. Throat: lips, mucosa, and tongue normal;  teeth and gums normal Neck: no adenopathy, no carotid bruit, no JVD, supple, symmetrical, trachea midline and thyroid not enlarged, symmetric, no tenderness/mass/nodules Back: symmetric, no curvature. ROM normal. No CVA tenderness. Lungs: clear to auscultation bilaterally Breasts: normal appearance, no masses or tenderness Heart: regular rate and rhythm, S1, S2 normal, no murmur, click, rub or gallop Abdomen: soft, non-tender; bowel sounds normal; no masses,  no organomegaly Pelvic: not indicated; status post hysterectomy, negative ROS Extremities: extremities normal, atraumatic, no cyanosis or edema Pulses: 2+ and symmetric Skin: Skin color, texture, turgor normal. No rashes or lesions Lymph nodes: Cervical, supraclavicular, and axillary nodes normal. Neurologic: Alert and oriented X 3, normal strength and tone. Normal symmetric reflexes. Normal coordination and gait    Assessment:    Healthy female exam.       Plan:    ghm utd Check labs  See After Visit Summary for Counseling Recommendations    1. Essential hypertension Well controlled, no changes to meds. Encouraged heart healthy diet such as the DASH diet and exercise as tolerated.  - CBC with Differential/Platelet - Comprehensive metabolic panel  2. Hyperlipidemia, unspecified hyperlipidemia type Encouraged heart healthy diet, increase exercise, avoid trans fats, consider a krill oil cap daily - Comprehensive metabolic panel - Lipid panel  3. Controlled type 2 diabetes mellitus with complication, without long-term current use of insulin (HCC) Check labs - Hemoglobin A1c - CBC with Differential/Platelet - Comprehensive metabolic panel - Lipid panel  4. Preventative health care See above  5. Influenza vaccine administered   - Flu vaccine HIGH DOSE PF (Fluzone High dose)

## 2018-04-07 NOTE — Patient Instructions (Signed)
Preventive Care 82 Years and Older, Female Preventive care refers to lifestyle choices and visits with your health care provider that can promote health and wellness. What does preventive care include?  A yearly physical exam. This is also called an annual well check.  Dental exams once or twice a year.  Routine eye exams. Ask your health care provider how often you should have your eyes checked.  Personal lifestyle choices, including: ? Daily care of your teeth and gums. ? Regular physical activity. ? Eating a healthy diet. ? Avoiding tobacco and drug use. ? Limiting alcohol use. ? Practicing safe sex. ? Taking low-dose aspirin every day. ? Taking vitamin and mineral supplements as recommended by your health care provider. What happens during an annual well check? The services and screenings done by your health care provider during your annual well check will depend on your age, overall health, lifestyle risk factors, and family history of disease. Counseling Your health care provider may ask you questions about your:  Alcohol use.  Tobacco use.  Drug use.  Emotional well-being.  Home and relationship well-being.  Sexual activity.  Eating habits.  History of falls.  Memory and ability to understand (cognition).  Work and work environment.  Reproductive health.  Screening You may have the following tests or measurements:  Height, weight, and BMI.  Blood pressure.  Lipid and cholesterol levels. These may be checked every 5 years, or more frequently if you are over 50 years old.  Skin check.  Lung cancer screening. You may have this screening every year starting at age 55 if you have a 30-pack-year history of smoking and currently smoke or have quit within the past 15 years.  Fecal occult blood test (FOBT) of the stool. You may have this test every year starting at age 50.  Flexible sigmoidoscopy or colonoscopy. You may have a sigmoidoscopy every 5 years or  a colonoscopy every 10 years starting at age 50.  Hepatitis C blood test.  Hepatitis B blood test.  Sexually transmitted disease (STD) testing.  Diabetes screening. This is done by checking your blood sugar (glucose) after you have not eaten for a while (fasting). You may have this done every 1-3 years.  Bone density scan. This is done to screen for osteoporosis. You may have this done starting at age 65.  Mammogram. This may be done every 1-2 years. Talk to your health care provider about how often you should have regular mammograms.  Talk with your health care provider about your test results, treatment options, and if necessary, the need for more tests. Vaccines Your health care provider may recommend certain vaccines, such as:  Influenza vaccine. This is recommended every year.  Tetanus, diphtheria, and acellular pertussis (Tdap, Td) vaccine. You may need a Td booster every 10 years.  Varicella vaccine. You may need this if you have not been vaccinated.  Zoster vaccine. You may need this after age 60.  Measles, mumps, and rubella (MMR) vaccine. You may need at least one dose of MMR if you were born in 1957 or later. You may also need a second dose.  Pneumococcal 13-valent conjugate (PCV13) vaccine. One dose is recommended after age 65.  Pneumococcal polysaccharide (PPSV23) vaccine. One dose is recommended after age 65.  Meningococcal vaccine. You may need this if you have certain conditions.  Hepatitis A vaccine. You may need this if you have certain conditions or if you travel or work in places where you may be exposed to hepatitis   A.  Hepatitis B vaccine. You may need this if you have certain conditions or if you travel or work in places where you may be exposed to hepatitis B.  Haemophilus influenzae type b (Hib) vaccine. You may need this if you have certain conditions.  Talk to your health care provider about which screenings and vaccines you need and how often you  need them. This information is not intended to replace advice given to you by your health care provider. Make sure you discuss any questions you have with your health care provider. Document Released: 06/09/2015 Document Revised: 01/31/2016 Document Reviewed: 03/14/2015 Elsevier Interactive Patient Education  2018 Elsevier Inc.  

## 2018-04-08 ENCOUNTER — Telehealth: Payer: Self-pay | Admitting: Family Medicine

## 2018-04-08 ENCOUNTER — Encounter: Payer: Self-pay | Admitting: Family Medicine

## 2018-04-08 LAB — LIPID PANEL
CHOLESTEROL: 186 mg/dL (ref 0–200)
HDL: 45.1 mg/dL (ref >39.00)
LDL Cholesterol: 105 mg/dL — ABNORMAL HIGH (ref 0–99)
NONHDL: 140.61
TRIGLYCERIDES: 178 mg/dL — AB (ref 0.0–149.0)
Total CHOL/HDL Ratio: 4
VLDL: 35.6 mg/dL (ref 0.0–40.0)

## 2018-04-08 LAB — CBC WITH DIFFERENTIAL/PLATELET
Basophils Absolute: 0.1 10*3/uL (ref 0.0–0.1)
Basophils Relative: 1.3 % (ref 0.0–3.0)
Eosinophils Absolute: 0.4 10*3/uL (ref 0.0–0.7)
Eosinophils Relative: 5.6 % — ABNORMAL HIGH (ref 0.0–5.0)
HEMATOCRIT: 34.1 % — AB (ref 36.0–46.0)
Hemoglobin: 11.9 g/dL — ABNORMAL LOW (ref 12.0–15.0)
LYMPHS ABS: 1.9 10*3/uL (ref 0.7–4.0)
Lymphocytes Relative: 26.9 % (ref 12.0–46.0)
MCHC: 34.8 g/dL (ref 30.0–36.0)
MCV: 101 fl — AB (ref 78.0–100.0)
MONOS PCT: 13.8 % — AB (ref 3.0–12.0)
Monocytes Absolute: 1 10*3/uL (ref 0.1–1.0)
NEUTROS ABS: 3.7 10*3/uL (ref 1.4–7.7)
Neutrophils Relative %: 52.4 % (ref 43.0–77.0)
PLATELETS: 207 10*3/uL (ref 150.0–400.0)
RBC: 3.38 Mil/uL — AB (ref 3.87–5.11)
RDW: 12.7 % (ref 11.5–15.5)
WBC: 7 10*3/uL (ref 4.0–10.5)

## 2018-04-08 LAB — COMPREHENSIVE METABOLIC PANEL
ALBUMIN: 4.4 g/dL (ref 3.5–5.2)
ALT: 12 U/L (ref 0–35)
AST: 12 U/L (ref 0–37)
Alkaline Phosphatase: 42 U/L (ref 39–117)
BUN: 23 mg/dL (ref 6–23)
CALCIUM: 9.9 mg/dL (ref 8.4–10.5)
CHLORIDE: 105 meq/L (ref 96–112)
CO2: 27 meq/L (ref 19–32)
Creatinine, Ser: 1.23 mg/dL — ABNORMAL HIGH (ref 0.40–1.20)
GFR: 43.98 mL/min — AB (ref >60.00)
Glucose, Bld: 108 mg/dL — ABNORMAL HIGH (ref 70–99)
POTASSIUM: 4.6 meq/L (ref 3.5–5.1)
Sodium: 140 mEq/L (ref 135–145)
Total Bilirubin: 0.5 mg/dL (ref 0.2–1.2)
Total Protein: 6.9 g/dL (ref 6.0–8.3)

## 2018-04-08 LAB — HEMOGLOBIN A1C: HEMOGLOBIN A1C: 5.9 % (ref 4.6–6.5)

## 2018-04-08 NOTE — Telephone Encounter (Signed)
Copied from CRM [phone]. Topic: Quick Communication - See Telephone Encounter >> Apr 08, 2018  3:30 PM Valentina LucksMatos, Jackelin wrote: CRM for notification. See Telephone encounter for: 04/08/18.   Pt dropped off document to be filled out by provider [employer] Pt Assistance Program Form 2 pages) Pt would like to have document mailed to home address when document ready. Document put at front office tray under providers name.

## 2018-04-10 NOTE — Telephone Encounter (Signed)
Completed as much as possible; forwarded to provider/SLS 11/15

## 2018-04-14 ENCOUNTER — Telehealth: Payer: Self-pay | Admitting: Family Medicine

## 2018-04-14 NOTE — Telephone Encounter (Signed)
Copied from CRM [phone]. Topic: Quick Communication - Lab Results (Clinic Use ONLY) >> Apr 13, 2018  5:30 PM Ewing, Vladimir CroftsRobin B, CMA wrote: Called patient to inform them of 04/13/2018 lab results. When patient returns call, triage nurse may disclose results.

## 2018-04-15 ENCOUNTER — Other Ambulatory Visit: Payer: Self-pay | Admitting: *Deleted

## 2018-04-15 ENCOUNTER — Telehealth: Payer: Self-pay | Admitting: *Deleted

## 2018-04-15 DIAGNOSIS — E785 Hyperlipidemia, unspecified: Secondary | ICD-10-CM

## 2018-04-15 DIAGNOSIS — I10 Essential (primary) hypertension: Secondary | ICD-10-CM

## 2018-04-15 DIAGNOSIS — E118 Type 2 diabetes mellitus with unspecified complications: Secondary | ICD-10-CM

## 2018-04-15 MED ORDER — PRAVASTATIN SODIUM 40 MG PO TABS: 40.0000 mg | ORAL_TABLET | Freq: Every day | ORAL | 3 refills | Status: DC

## 2018-04-15 NOTE — Telephone Encounter (Signed)
Patient wanted to check the status on this form.  Did you get it back from Dr. Laury AxonLowne and faxed it?

## 2018-04-15 NOTE — Telephone Encounter (Signed)
Made copy of [employer] patient Assistance Application and mailed to pt home address as instructed/SLS 11/20

## 2018-04-17 NOTE — Telephone Encounter (Signed)
Informed PEC: Orlene ErmConversation  [employer](Newest Message First)  Me    04/15/18 12:28 PM  Note    Made copy of [employer] patient Chief of StaffAssistance Application and mailed to pt home address as instructed/SLS 11/20

## 2018-04-20 NOTE — Telephone Encounter (Signed)
Patient called to say that [employer] paperwork did not come in the mail today. She will call back if its not there tomorrow either 04/21/18

## 2018-04-20 NOTE — Telephone Encounter (Signed)
I am sorry, I have no control over the post office, but I will wait to see if she receives, as I only have a copy of the paperwork; patient requested original be mailed back to her home address/SLS 11/25

## 2018-04-24 ENCOUNTER — Other Ambulatory Visit: Payer: Self-pay | Admitting: Family Medicine

## 2018-04-24 DIAGNOSIS — E1165 Type 2 diabetes mellitus with hyperglycemia: Principal | ICD-10-CM

## 2018-04-24 DIAGNOSIS — E1151 Type 2 diabetes mellitus with diabetic peripheral angiopathy without gangrene: Secondary | ICD-10-CM

## 2018-04-24 DIAGNOSIS — IMO0002 Reserved for concepts with insufficient information to code with codable children: Secondary | ICD-10-CM

## 2018-04-27 NOTE — Telephone Encounter (Signed)
Pt calling stating that she had called [employer] they haven't received the paperwork and that they want take a copy of application but they need the original copy please call pt at [phone] she would like to speak with someone today

## 2018-04-28 ENCOUNTER — Other Ambulatory Visit: Payer: Self-pay | Admitting: *Deleted

## 2018-04-28 DIAGNOSIS — E1151 Type 2 diabetes mellitus with diabetic peripheral angiopathy without gangrene: Secondary | ICD-10-CM

## 2018-04-28 DIAGNOSIS — IMO0002 Reserved for concepts with insufficient information to code with codable children: Secondary | ICD-10-CM

## 2018-04-28 DIAGNOSIS — E1165 Type 2 diabetes mellitus with hyperglycemia: Principal | ICD-10-CM

## 2018-04-28 MED ORDER — SITAGLIPTIN PHOSPHATE 100 MG PO TABS: 100.0000 mg | ORAL_TABLET | Freq: Every day | ORAL | 1 refills | Status: DC

## 2018-04-28 NOTE — Telephone Encounter (Signed)
Left message on machine to let patient know that original was mailed to her .

## 2018-04-29 NOTE — Telephone Encounter (Signed)
Was on phone with patient for a little over 18 minutes  Explained to patient that we were unable to locate copy of application and asked if she could please fill out and sign the copy that she received from [employer].  She will get it by today.    Also I talked with a former rep to see if we can get her some samples.  She will email an application and also she had given a number to the [facility] for [employer].  Tried calling number and was not right number.  Found another number online and they help me order online but with Januvia we have to fax it in with a signature.  Former rep will see if she can get in contact with new rep to see if he can come by to get us some samples for this patient.   Advised patient not to pickup refill until she hears from me.  I am working on getting her some samples in to get her thru until we see if the assistance program with approve her application.    Patient also had concerns about getting a call from someone about flu vaccines after she had had her flu shot from here and she had asked also why they did not give her lab results at that time.  I advised that they are from a different department and apologized to patient.

## 2018-04-30 NOTE — Telephone Encounter (Signed)
Leave message for Emily Daniel

## 2018-04-30 NOTE — Telephone Encounter (Signed)
Spoke with Sigmund HazelKyle, Januvia rep, he stated he does not have samples but he has a coupon that patient can use for a 30 day free trial.  Can not find coupon online so he will bring by mid next week.  Spoke with patient advised that I will get application out on Monday as soon as I get her signature.  I will call her as soon as drug rep drop off coupon.

## 2018-05-04 NOTE — Telephone Encounter (Signed)
Pt calling to check status and talk with Apolonio SchneidersSheketia, please advise [phone]

## 2018-05-04 NOTE — Telephone Encounter (Signed)
Spoke with patient and updated that we sent off her application and now we are just waiting on the coupon.  Emily Daniel is checking for samples to see if we can borrow some from another [facility] office.  I will call patient tomorrow to let there know.

## 2018-05-05 NOTE — Telephone Encounter (Signed)
Spoke with patient and advised I have not heard back yet.  She will go ahead and get medication this time.  I advised that I will call [employer] next week to make sure that they received the application.  She was very appreciative with us trying so hard to get samples.

## 2018-05-05 NOTE — Telephone Encounter (Signed)
Author reached out to other [facility] practices, but no [place] supply available. Author phoned Emily Daniel at number given by Arden [place]ia, [place] [phone], to request coupon be faxed but no answer, left detailed VM.

## 2018-05-13 NOTE — Telephone Encounter (Signed)
Called and updated patient.  Looks like [employer] has not received paperwork back yet.  They advised me to call back on Friday to check to see if they have received it.

## 2018-05-25 NOTE — Telephone Encounter (Signed)
Spoke with [employer] patient assistance and they still have not received application yet.  She advised me to call back on Friday 05/29/18

## 2018-05-26 ENCOUNTER — Encounter: Payer: Self-pay | Admitting: Family Medicine

## 2018-06-01 NOTE — Telephone Encounter (Signed)
Pt is calling sheketia and would like a callback concerning [place]

## 2018-06-03 NOTE — Telephone Encounter (Signed)
Left message on machine to call back   [facility] and they stated that they sent out an attestation form to patient on 05/28/18 for her to sign and send back.

## 2018-06-03 NOTE — Telephone Encounter (Signed)
Pt said she called [employer] yesterday and they told her the form was mail on 05/28/2018. Pt is returning sheketia call

## 2018-06-05 NOTE — Telephone Encounter (Signed)
Patient notified that we have have samples here when she needs them and to make sure that she gets the form back in when she gets it.

## 2018-06-05 NOTE — Telephone Encounter (Signed)
Pt states she received the letter in the mail and would like to discuss the letter with [facility]. Please advise.

## 2018-06-05 NOTE — Telephone Encounter (Signed)
See previous note

## 2018-06-22 ENCOUNTER — Other Ambulatory Visit: Payer: Self-pay | Admitting: Cardiovascular Disease

## 2018-06-22 DIAGNOSIS — I739 Peripheral vascular disease, unspecified: Secondary | ICD-10-CM

## 2018-07-13 ENCOUNTER — Telehealth: Payer: Self-pay

## 2018-07-13 ENCOUNTER — Ambulatory Visit (HOSPITAL_BASED_OUTPATIENT_CLINIC_OR_DEPARTMENT_OTHER)
Admission: RE | Admit: 2018-07-13 | Discharge: 2018-07-13 | Disposition: A | Discharge status: Home / Self Care | Payer: Medicare Other | Source: Ambulatory Visit | Attending: Cardiovascular Disease | Admitting: Cardiovascular Disease

## 2018-07-13 ENCOUNTER — Ambulatory Visit (HOSPITAL_COMMUNITY)
Admission: RE | Admit: 2018-07-13 | Discharge: 2018-07-13 | Disposition: A | Discharge status: Home / Self Care | Payer: Medicare Other | Source: Ambulatory Visit | Attending: Cardiology | Admitting: Cardiology

## 2018-07-13 DIAGNOSIS — I6523 Occlusion and stenosis of bilateral carotid arteries: Secondary | ICD-10-CM | POA: Diagnosis not present

## 2018-07-13 DIAGNOSIS — I739 Peripheral vascular disease, unspecified: Secondary | ICD-10-CM

## 2018-07-13 NOTE — Telephone Encounter (Signed)
Patient aware of results. Per Dr. Eden Emms, Left ICA 60-79% stenosis.  No TIA symptoms.  Continue antiplatelet Rx and F/U carotid duplex in 6 months . Patient verbalized understanding.

## 2018-07-13 NOTE — Telephone Encounter (Signed)
-----   Message from Wendall Stade, MD sent at 07/13/2018  2:14 PM EST ----- Left ICA 60-79% stenosis.  No TIA symptoms.  Continue antiplatelet Rx and F/U carotid duplex in 6 months

## 2018-07-17 ENCOUNTER — Other Ambulatory Visit: Payer: Self-pay

## 2018-07-20 ENCOUNTER — Other Ambulatory Visit (INDEPENDENT_AMBULATORY_CARE_PROVIDER_SITE_OTHER): Payer: Medicare Other

## 2018-07-20 DIAGNOSIS — I10 Essential (primary) hypertension: Secondary | ICD-10-CM

## 2018-07-20 DIAGNOSIS — E118 Type 2 diabetes mellitus with unspecified complications: Secondary | ICD-10-CM

## 2018-07-20 LAB — LIPID PANEL
CHOL/HDL RATIO: 4
Cholesterol: 165 mg/dL (ref 0–200)
HDL: 45.9 mg/dL (ref >39.00)
LDL CALC: 92 mg/dL (ref 0–99)
NonHDL: 119.25
Triglycerides: 137 mg/dL (ref 0.0–149.0)
VLDL: 27.4 mg/dL (ref 0.0–40.0)

## 2018-07-20 LAB — COMPREHENSIVE METABOLIC PANEL
ALT: 11 U/L (ref 0–35)
AST: 12 U/L (ref 0–37)
Albumin: 4.3 g/dL (ref 3.5–5.2)
Alkaline Phosphatase: 44 U/L (ref 39–117)
BUN: 23 mg/dL (ref 6–23)
CHLORIDE: 108 meq/L (ref 96–112)
CO2: 27 mEq/L (ref 19–32)
Calcium: 9.6 mg/dL (ref 8.4–10.5)
Creatinine, Ser: 1.23 mg/dL — ABNORMAL HIGH (ref 0.40–1.20)
GFR: 41.35 mL/min — ABNORMAL LOW (ref >60.00)
Glucose, Bld: 122 mg/dL — ABNORMAL HIGH (ref 70–99)
POTASSIUM: 4.2 meq/L (ref 3.5–5.1)
Sodium: 144 mEq/L (ref 135–145)
Total Bilirubin: 0.5 mg/dL (ref 0.2–1.2)
Total Protein: 6.7 g/dL (ref 6.0–8.3)

## 2018-07-20 LAB — HEMOGLOBIN A1C: Hgb A1c MFr Bld: 5.5 % (ref 4.6–6.5)

## 2018-07-22 ENCOUNTER — Encounter: Payer: Self-pay | Admitting: *Deleted

## 2018-07-28 ENCOUNTER — Ambulatory Visit: Payer: Medicare Other | Admitting: Cardiovascular Disease

## 2018-07-28 ENCOUNTER — Encounter: Payer: Self-pay | Admitting: Cardiovascular Disease

## 2018-07-28 VITALS — BP 179/68 | HR 69 | Ht 60.0 in | Wt 129.0 lb

## 2018-07-28 DIAGNOSIS — I739 Peripheral vascular disease, unspecified: Secondary | ICD-10-CM | POA: Diagnosis not present

## 2018-07-28 DIAGNOSIS — I779 Disorder of arteries and arterioles, unspecified: Secondary | ICD-10-CM

## 2018-07-28 DIAGNOSIS — E785 Hyperlipidemia, unspecified: Secondary | ICD-10-CM | POA: Diagnosis not present

## 2018-07-28 DIAGNOSIS — I10 Essential (primary) hypertension: Secondary | ICD-10-CM

## 2018-07-28 NOTE — Patient Instructions (Signed)
Medication Instructions:  No changes If you need a refill on your cardiac medications before your next appointment, please call your pharmacy.   Lab work: None ordered  Testing/Procedures: None ordered  Follow-Up: At [facility], you and your health needs are our priority.  As part of our continuing mission to provide you with exceptional heart care, we have created designated Provider Care Teams.  These Care Teams include your primary Cardiologist (physician) and Advanced Practice Providers (APPs -  Physician Assistants and Nurse Practitioners) who all work together to provide you with the care you need, when you need it. You will need a follow up appointment in 12 months.  Please call our office 2 months in advance to schedule this appointment.  You may see Dr. Arida or one of the following Advanced Practice Providers on your designated Care Team:   Luke Kilroy, PA-C Krista Kroeger, PA-C . Callie Goodrich, PA-C   

## 2018-07-28 NOTE — Progress Notes (Signed)
Cardiology Office Note   Date:  07/28/2018   ID:  Emily Daniel, DOB 1930/01/25, MRN [id number]  PCP:  Donato Schultz, DO  Cardiologist:  Dr. Eden Emms  Chief Complaint  Patient presents with  . Follow-up    pt denied chest pain      History of Present Illness: Emily Daniel is a 85 y.o. female who presents for a follow-up visit regarding peripheral arterial disease. She has known history of CAD status post CABG and mitral valve repair in 2015, HTN, HL, diabetes, PAD, AVMs, prior tobacco abuse.  She is known to have peripheral arterial disease with ABI of 0.4 on the right and normal on the left. This is due to inflow disease with occluded right common iliac artery and significant left common iliac artery stenosis.  She was treated medically given that her claudication was not lifestyle limiting.  She continues to have stable symptoms overall right leg claudication.  She was able to do 4 hours of yard work yesterday without significant limitations.  No chest pain or shortness of breath.  Past Medical History:  Diagnosis Date  . Anxiety   . Atrial fibrillation (HCC)    post op >>> req'd Amio and Coumadin  . AVM (arteriovenous malformation)   . CAD (coronary artery disease)    a. NSTEMI 10/2013 >>> LHC (10/2013):  Distal left main 90%, proximal LAD 30%, mid LAD 20%, ostial circumflex 99%, then 80%, mid RCA 99%, EF 30-35%, 3-4+ MR; proximal right iliac occluded, right renal 50%, ostial left iliac 70% >>> CABG/MV repair  . Cataract   . CHF (congestive heart failure) (HCC)    Dr. Dorris Fetch following  . Chronic systolic heart failure (HCC)   . Diabetes mellitus, type 2 (HCC)   . Hemorrhoids   . Hx of echocardiogram    Echo (9/15):  Mild LVH, EF 40-45%, inf AK, Gr 1 DD, trivial AI, MV repair ok (mild MS, mild MR), mild LAE, PASP 32 mmHg.  [name] Hyperlipidemia   . Hypertension   . Ischemic cardiomyopathy    a. Echo (11/08/13):  EF 30-35%, diffuse hypokinesis, severe MR, mild  LAE, PASP 48 mm Hg  . Mitral regurgitation    a. s/p MV repair at time of CABG  . Osteoporosis   . PVD (peripheral vascular disease) (HCC)   . Vitamin B12 deficiency     Past Surgical History:  Procedure Laterality Date  . ABDOMINAL HYSTERECTOMY     1969  . APPENDECTOMY     1945  . CORONARY ARTERY BYPASS GRAFT  1990, 1995, 1998  . HEMORRHOID SURGERY     1959  . LEFT AND RIGHT HEART CATHETERIZATION WITH CORONARY ANGIOGRAM N/A 11/10/2013   Procedure: LEFT AND RIGHT HEART CATHETERIZATION WITH CORONARY ANGIOGRAM;  Surgeon: Lennette Bihari, MD;  [facility] CATH LAB;  Service: Cardiovascular;  Laterality: N/A;  . MITRAL VALVE REPLACEMENT (MVR)/CORONARY ARTERY BYPASS GRAFTING (CABG) N/A 11/11/2013   Procedure: MITRAL VALVE Ring repair (MVR)/CORONARY ARTERY BYPASS GRAFTING (CABG) times three using left internal mammary and left sphenous vein.;  Surgeon: Loreli Slot, MD;  Location: [facility] OR;  Service: Open Heart Surgery;  Laterality: N/A;  Coronary artery bypass graft times three using left internal mammary artery and left saphenous leg vein using endoscope.  Exploration of right leg.      Current Outpatient Medications  Medication Sig Dispense Refill  . Aspirin (ECOTRIN LOW STRENGTH PO) Take by mouth.    . Calcium Carbonate-Vitamin D (  CALCIUM-VITAMIN D) 600-200 MG-UNIT CAPS Take 1 capsule by mouth 2 (two) times daily.     [name] Sodium 30-100 MG CAPS Take 1 capsule by mouth daily as needed (for constipation).     . ferrous sulfate 325 (65 FE) MG tablet Take 325 mg by mouth 2 (two) times daily with a meal.     . lisinopril (PRINIVIL,ZESTRIL) 40 MG tablet TAKE 1 TABLET BY MOUTH ONCE DAILY 90 tablet 3  . metoprolol tartrate (LOPRESSOR) 50 MG tablet TAKE 1 TABLET BY MOUTH TWICE DAILY 180 tablet 3  . Multiple Vitamin (MULTIVITAMIN) tablet Take 1 tablet by mouth daily.      . pravastatin (PRAVACHOL) 40 MG tablet Take 1 tablet (40 mg total) by mouth daily. 90 tablet 3  .  sitaGLIPtin (JANUVIA) 100 MG tablet Take 1 tablet (100 mg total) by mouth daily. 90 tablet 1  . vitamin B-12 (CYANOCOBALAMIN) 1000 MCG tablet Take 1,000 mcg by mouth daily.       No current facility-administered medications for this visit.     Allergies:   Meperidine hcl; Sulfonamide derivatives; and Demerol [meperidine]    Social History:  The patient  reports that she quit smoking about 34 years ago. Her smoking use included cigarettes. She has a 36.00 pack-year smoking history. She has never used smokeless tobacco. She reports that she does not drink alcohol or use drugs.   Family History:  The patient's family history includes Cancer in her brother; Cervical cancer in her mother; Hypertension in her sister and another family member; Stroke in an other family member.    ROS:  Please see the history of present illness.   Otherwise, review of systems are positive for none.   All other systems are reviewed and negative.    PHYSICAL EXAM: VS:  BP (!) 179/68   Pulse 69   Ht 5' (1.524 m)   Wt 129 lb (58.5 kg)   BMI 25.19 kg/m  , BMI Body mass index is 25.19 kg/m. GEN: Well nourished, well developed, in no acute distress  HEENT: normal  Neck: no JVD, carotid bruits, or masses Cardiac: RRR; no rubs, or gallops,no edema . 2/6 systolic ejection murmur in the aortic area Respiratory:  clear to auscultation bilaterally, normal work of breathing GI: soft, nontender, nondistended, + BS MS: no deformity or atrophy  Skin: warm and dry, no rash Neuro:  Strength and sensation are intact Psych: euthymic mood, full affect    EKG:  EKG is not ordered today.   Recent Labs: 04/07/2018: Hemoglobin 11.9; Platelets 207.0 07/20/2018: ALT 11; BUN 23; Creatinine, Ser 1.23; Potassium 4.2; Sodium 144    Lipid Panel    Component Value Date/Time   CHOL 165 07/20/2018 0932   TRIG 137.0 07/20/2018 0932   HDL 45.90 07/20/2018 0932   CHOLHDL 4 07/20/2018 0932   VLDL 27.4 07/20/2018 0932   LDLCALC  92 07/20/2018 0932   LDLDIRECT 156.0 03/13/2009 0000      Wt Readings from Last 3 Encounters:  07/28/18 129 lb (58.5 kg)  04/07/18 128 lb 6.4 oz (58.2 kg)  12/18/17 125 lb 6.4 oz (56.9 kg)      No flowsheet data found.    ASSESSMENT AND PLAN:  1.  Peripheral arterial disease: Known inflow disease worse on the right side.  Recent arterial Doppler showed stable ABI at 0.45 on the right and 0.98 on the left.  Still with stable right leg claudication.  Considering her age and stable symptoms, I recommend  continuing medical therapy.  2. Essential hypertension: Blood pressure is elevated but she tends to have a component of whitecoat syndrome.  3.  Hyperlipidemia: Currently on pravastatin.  Most recent LDL was 92.  4.  Carotid artery disease: Recent Doppler showed 60 to 79% left carotid artery stenosis.  Repeat study in 1 year.   Disposition:   FU with me in 1 year  Signed,  Lorine Bears, MD  07/28/2018 1:12 PM    [facility]

## 2018-08-25 ENCOUNTER — Other Ambulatory Visit (INDEPENDENT_AMBULATORY_CARE_PROVIDER_SITE_OTHER): Payer: Medicare Other

## 2018-08-25 ENCOUNTER — Ambulatory Visit: Payer: Self-pay | Admitting: *Deleted

## 2018-08-25 ENCOUNTER — Ambulatory Visit: Payer: Self-pay | Admitting: Family Medicine

## 2018-08-25 ENCOUNTER — Encounter: Payer: Self-pay | Admitting: Family Medicine

## 2018-08-25 ENCOUNTER — Observation Stay (HOSPITAL_COMMUNITY)
Admission: EM | Admit: 2018-08-25 | Discharge: 2018-08-25 | Disposition: A | Discharge status: Home / Self Care | Payer: Medicare Other | Attending: Internal Medicine | Admitting: Internal Medicine

## 2018-08-25 ENCOUNTER — Observation Stay (HOSPITAL_COMMUNITY): Payer: Medicare Other

## 2018-08-25 ENCOUNTER — Encounter (HOSPITAL_COMMUNITY): Payer: Self-pay | Admitting: *Deleted

## 2018-08-25 ENCOUNTER — Emergency Department (HOSPITAL_COMMUNITY): Payer: Medicare Other

## 2018-08-25 ENCOUNTER — Other Ambulatory Visit: Payer: Self-pay

## 2018-08-25 ENCOUNTER — Ambulatory Visit (INDEPENDENT_AMBULATORY_CARE_PROVIDER_SITE_OTHER): Payer: Medicare Other | Admitting: Family Medicine

## 2018-08-25 DIAGNOSIS — R479 Unspecified speech disturbances: Secondary | ICD-10-CM

## 2018-08-25 DIAGNOSIS — I4891 Unspecified atrial fibrillation: Secondary | ICD-10-CM | POA: Diagnosis not present

## 2018-08-25 DIAGNOSIS — Z951 Presence of aortocoronary bypass graft: Secondary | ICD-10-CM | POA: Insufficient documentation

## 2018-08-25 DIAGNOSIS — I5022 Chronic systolic (congestive) heart failure: Secondary | ICD-10-CM | POA: Diagnosis not present

## 2018-08-25 DIAGNOSIS — R4701 Aphasia: Secondary | ICD-10-CM | POA: Diagnosis present

## 2018-08-25 DIAGNOSIS — Z79899 Other long term (current) drug therapy: Secondary | ICD-10-CM | POA: Insufficient documentation

## 2018-08-25 DIAGNOSIS — S80862A Insect bite (nonvenomous), left lower leg, initial encounter: Secondary | ICD-10-CM | POA: Diagnosis not present

## 2018-08-25 DIAGNOSIS — Z885 Allergy status to narcotic agent status: Secondary | ICD-10-CM | POA: Diagnosis not present

## 2018-08-25 DIAGNOSIS — Q282 Arteriovenous malformation of cerebral vessels: Secondary | ICD-10-CM | POA: Insufficient documentation

## 2018-08-25 DIAGNOSIS — Z952 Presence of prosthetic heart valve: Secondary | ICD-10-CM | POA: Insufficient documentation

## 2018-08-25 DIAGNOSIS — E1151 Type 2 diabetes mellitus with diabetic peripheral angiopathy without gangrene: Secondary | ICD-10-CM | POA: Diagnosis not present

## 2018-08-25 DIAGNOSIS — G459 Transient cerebral ischemic attack, unspecified: Principal | ICD-10-CM | POA: Diagnosis present

## 2018-08-25 DIAGNOSIS — I110 Hypertensive heart disease with heart failure: Secondary | ICD-10-CM | POA: Diagnosis not present

## 2018-08-25 DIAGNOSIS — W57XXXA Bitten or stung by nonvenomous insect and other nonvenomous arthropods, initial encounter: Secondary | ICD-10-CM

## 2018-08-25 DIAGNOSIS — R4789 Other speech disturbances: Secondary | ICD-10-CM | POA: Diagnosis not present

## 2018-08-25 DIAGNOSIS — I10 Essential (primary) hypertension: Secondary | ICD-10-CM | POA: Diagnosis present

## 2018-08-25 DIAGNOSIS — I2510 Atherosclerotic heart disease of native coronary artery without angina pectoris: Secondary | ICD-10-CM | POA: Insufficient documentation

## 2018-08-25 DIAGNOSIS — I252 Old myocardial infarction: Secondary | ICD-10-CM | POA: Diagnosis not present

## 2018-08-25 DIAGNOSIS — D509 Iron deficiency anemia, unspecified: Secondary | ICD-10-CM | POA: Diagnosis not present

## 2018-08-25 DIAGNOSIS — R4781 Slurred speech: Secondary | ICD-10-CM | POA: Diagnosis not present

## 2018-08-25 DIAGNOSIS — Z882 Allergy status to sulfonamides status: Secondary | ICD-10-CM | POA: Diagnosis not present

## 2018-08-25 DIAGNOSIS — E785 Hyperlipidemia, unspecified: Secondary | ICD-10-CM | POA: Insufficient documentation

## 2018-08-25 DIAGNOSIS — IMO0002 Reserved for concepts with insufficient information to code with codable children: Secondary | ICD-10-CM | POA: Diagnosis present

## 2018-08-25 DIAGNOSIS — Z7982 Long term (current) use of aspirin: Secondary | ICD-10-CM | POA: Insufficient documentation

## 2018-08-25 DIAGNOSIS — I255 Ischemic cardiomyopathy: Secondary | ICD-10-CM | POA: Insufficient documentation

## 2018-08-25 DIAGNOSIS — Z87891 Personal history of nicotine dependence: Secondary | ICD-10-CM | POA: Insufficient documentation

## 2018-08-25 DIAGNOSIS — E1165 Type 2 diabetes mellitus with hyperglycemia: Secondary | ICD-10-CM

## 2018-08-25 LAB — CBC WITH DIFFERENTIAL/PLATELET
Abs Immature Granulocytes: 0.04 10*3/uL (ref 0.00–0.07)
Basophils Absolute: 0 10*3/uL (ref 0.0–0.1)
Basophils Relative: 0 %
EOS PCT: 0 %
Eosinophils Absolute: 0 10*3/uL (ref 0.0–0.5)
HCT: 31.5 % — ABNORMAL LOW (ref 36.0–46.0)
Hemoglobin: 10.3 g/dL — ABNORMAL LOW (ref 12.0–15.0)
Immature Granulocytes: 0 %
Lymphocytes Relative: 18 %
Lymphs Abs: 1.7 10*3/uL (ref 0.7–4.0)
MCH: 34.8 pg — ABNORMAL HIGH (ref 26.0–34.0)
MCHC: 32.7 g/dL (ref 30.0–36.0)
MCV: 106.4 fL — AB (ref 80.0–100.0)
Monocytes Absolute: 1.5 10*3/uL — ABNORMAL HIGH (ref 0.1–1.0)
Monocytes Relative: 15 %
Neutro Abs: 6.3 10*3/uL (ref 1.7–7.7)
Neutrophils Relative %: 67 %
Platelets: 167 10*3/uL (ref 150–400)
RBC: 2.96 MIL/uL — ABNORMAL LOW (ref 3.87–5.11)
RDW: 12.2 % (ref 11.5–15.5)
WBC: 9.5 10*3/uL (ref 4.0–10.5)
nRBC: 0 % (ref 0.0–0.2)

## 2018-08-25 LAB — COMPREHENSIVE METABOLIC PANEL
ALT: 23 U/L (ref 0–44)
ANION GAP: 11 (ref 5–15)
AST: 29 U/L (ref 15–41)
Albumin: 3.9 g/dL (ref 3.5–5.0)
Alkaline Phosphatase: 40 U/L (ref 38–126)
BUN: 30 mg/dL — ABNORMAL HIGH (ref 8–23)
CHLORIDE: 104 mmol/L (ref 98–111)
CO2: 22 mmol/L (ref 22–32)
Calcium: 9 mg/dL (ref 8.9–10.3)
Creatinine, Ser: 1.65 mg/dL — ABNORMAL HIGH (ref 0.44–1.00)
GFR calc Af Amer: 32 mL/min — ABNORMAL LOW (ref >60)
GFR calc non Af Amer: 28 mL/min — ABNORMAL LOW (ref >60)
Glucose, Bld: 145 mg/dL — ABNORMAL HIGH (ref 70–99)
Potassium: 3.9 mmol/L (ref 3.5–5.1)
Sodium: 137 mmol/L (ref 135–145)
Total Bilirubin: 0.9 mg/dL (ref 0.3–1.2)
Total Protein: 7.3 g/dL (ref 6.5–8.1)

## 2018-08-25 LAB — URINALYSIS, ROUTINE W REFLEX MICROSCOPIC
BACTERIA UA: NONE SEEN
Bilirubin Urine: NEGATIVE
Glucose, UA: NEGATIVE mg/dL
Hgb urine dipstick: NEGATIVE
Ketones, ur: 5 mg/dL — AB
Nitrite: NEGATIVE
PROTEIN: 30 mg/dL — AB
Specific Gravity, Urine: 1.02 (ref 1.005–1.030)
WBC, UA: >50 WBC/hpf — ABNORMAL HIGH (ref 0–5)
pH: 5 (ref 5.0–8.0)

## 2018-08-25 LAB — RAPID URINE DRUG SCREEN, HOSP PERFORMED
Amphetamines: NOT DETECTED
Barbiturates: NOT DETECTED
Benzodiazepines: NOT DETECTED
Cocaine: NOT DETECTED
OPIATES: NOT DETECTED
Tetrahydrocannabinol: NOT DETECTED

## 2018-08-25 LAB — APTT: aPTT: 29 seconds (ref 24–36)

## 2018-08-25 LAB — PROTIME-INR
INR: 1.2 (ref 0.8–1.2)
Prothrombin Time: 14.8 seconds (ref 11.4–15.2)

## 2018-08-25 LAB — ETHANOL: Alcohol, Ethyl (B): <10 mg/dL (ref <10)

## 2018-08-25 LAB — CBG MONITORING, ED: Glucose-Capillary: 133 mg/dL — ABNORMAL HIGH (ref 70–99)

## 2018-08-25 MED ORDER — ASPIRIN 300 MG RE SUPP: 300.0000 mg | Freq: Every day | RECTAL | Status: DC

## 2018-08-25 MED ORDER — ACETAMINOPHEN 650 MG RE SUPP: 650.0000 mg | RECTAL | Status: DC | PRN

## 2018-08-25 MED ORDER — METOPROLOL TARTRATE 25 MG PO TABS: 50.0000 mg | ORAL_TABLET | Freq: Two times a day (BID) | ORAL | Status: DC

## 2018-08-25 MED ORDER — PRAVASTATIN SODIUM 20 MG PO TABS: 40.0000 mg | ORAL_TABLET | Freq: Every day | ORAL | Status: DC

## 2018-08-25 MED ORDER — STROKE: EARLY STAGES OF RECOVERY BOOK: Freq: Once | Status: DC

## 2018-08-25 MED ORDER — VITAMIN B-12 1000 MCG PO TABS: 1000.0000 ug | ORAL_TABLET | Freq: Every day | ORAL | Status: DC

## 2018-08-25 MED ORDER — LINAGLIPTIN 5 MG PO TABS: 5.0000 mg | ORAL_TABLET | Freq: Every day | ORAL | Status: DC

## 2018-08-25 MED ORDER — SENNOSIDES-DOCUSATE SODIUM 8.6-50 MG PO TABS: 1.0000 | ORAL_TABLET | Freq: Every evening | ORAL | Status: DC | PRN

## 2018-08-25 MED ORDER — CALCIUM-VITAMIN D 600-200 MG-UNIT PO CAPS: 1.0000 | ORAL_CAPSULE | Freq: Two times a day (BID) | ORAL | Status: DC

## 2018-08-25 MED ORDER — ACETAMINOPHEN 160 MG/5ML PO SOLN: 650.0000 mg | ORAL | Status: DC | PRN

## 2018-08-25 MED ORDER — DOXYCYCLINE HYCLATE 100 MG PO TABS: 100.0000 mg | ORAL_TABLET | Freq: Two times a day (BID) | ORAL | 0 refills | Status: DC

## 2018-08-25 MED ORDER — TETRAHYDROZOLINE HCL 0.05 % OP SOLN
1.0000 [drp] | Freq: Every day | OPHTHALMIC | Status: DC | PRN
  Filled 2018-08-25: qty 15

## 2018-08-25 MED ORDER — ASPIRIN 325 MG PO TABS: 325.0000 mg | ORAL_TABLET | Freq: Every day | ORAL | Status: DC

## 2018-08-25 MED ORDER — ENOXAPARIN SODIUM 40 MG/0.4ML ~~LOC~~ SOLN: 40.0000 mg | SUBCUTANEOUS | Status: DC

## 2018-08-25 MED ORDER — ONE-DAILY MULTI VITAMINS PO TABS: 1.0000 | ORAL_TABLET | Freq: Every day | ORAL | Status: DC

## 2018-08-25 MED ORDER — ACETAMINOPHEN 500 MG PO TABS: 500.0000 mg | ORAL_TABLET | Freq: Every day | ORAL | Status: DC | PRN

## 2018-08-25 MED ORDER — LISINOPRIL 20 MG PO TABS: 40.0000 mg | ORAL_TABLET | Freq: Every day | ORAL | Status: DC

## 2018-08-25 MED ORDER — ACETAMINOPHEN 325 MG PO TABS: 650.0000 mg | ORAL_TABLET | ORAL | Status: DC | PRN

## 2018-08-25 MED ORDER — FERROUS SULFATE 325 (65 FE) MG PO TABS: 325.0000 mg | ORAL_TABLET | Freq: Two times a day (BID) | ORAL | Status: DC

## 2018-08-25 MED ORDER — DOXYCYCLINE HYCLATE 100 MG PO TABS: 100.0000 mg | ORAL_TABLET | Freq: Two times a day (BID) | ORAL | Status: DC

## 2018-08-25 NOTE — Telephone Encounter (Signed)
Patient was returning home- patient has episode of mumbling-garbled speaking for several seconds- were patient couldn't get thought outs out. Patient did recover and now she normal. Due to her age and protocol this has to be checked- office closed- ED evaluation recommended.  Reason for Disposition . [1] Loss of speech or garbled speech AND [2] sudden onset AND [3] brief (now gone)  Answer Assessment - Initial Assessment Questions 1. SYMPTOM: "What is the main symptom you are concerned about?" (e.g., weakness, numbness)     Trying to speak- but couldn't get the correct words out- lasted 1 minute 2. ONSET: "When did this start?" (minutes, hours, days; while sleeping)     After the appointment when got home 3. LAST NORMAL: "When was the last time you were normal (no symptoms)?"     Lasted 1-2 minutes- patient sat down- and patient has recovered 4. PATTERN "Does this come and go, or has it been constant since it started?"  "Is it present now?"     1-2 episode and now gone 5. CARDIAC SYMPTOMS: "Have you had any of the following symptoms: chest pain, difficulty breathing, palpitations?"     no 6. NEUROLOGIC SYMPTOMS: "Have you had any of the following symptoms: headache, dizziness, vision loss, double vision, changes in speech, unsteady on your feet?"     Changes in speech 7. OTHER SYMPTOMS: "Do you have any other symptoms?"     No other symptoms 8. PREGNANCY: "Is there any chance you are pregnant?" "When was your last menstrual period?"     n/a  Protocols used: NEUROLOGIC DEFICIT-A-AH

## 2018-08-25 NOTE — Progress Notes (Signed)
Virtual Visit via Video Note  I connected with Emily Daniel on 08/25/18 at  1:30 PM EDT by a video enabled telemedicine application and verified that I am speaking with the correct person using two identifiers.   I discussed the limitations of evaluation and management by telemedicine and the availability of in person appointments. The patient expressed understanding and agreed to proceed.  History of Present Illness: Tick bite x 4  l leg on 23rd-- she had to pull them off-- her son is with her at home and they were able to show me her legs --  Areas on L leg where ticks were are very red , hot to touch per pt and itchy      Observations/Objective:  temp +101  Pt in NAD-- non toxic looking  No sob   Assessment and Plan: Tick bite of left lower leg, initial encounter - Plan: doxycycline (VIBRA-TABS) 100 MG tablet, B. burgdorfi antibodies, [place] spotted fvr abs pnl(IgG+IgM), CBC with Differential/Platelet, Comprehensive metabolic panel f/u 1 week or sooner prn    Follow Up Instructions:    I discussed the assessment and treatment plan with the patient. The patient was provided an opportunity to ask questions and all were answered. The patient agreed with the plan and demonstrated an understanding of the instructions.   The patient was advised to call back or seek an in-person evaluation if the symptoms worsen or if the condition fails to improve as anticipated.  I provided 25 minutes of non-face-to-face time during this encounter.   Donato Schultz, DO

## 2018-08-25 NOTE — Telephone Encounter (Signed)
Patient seen today by virtual visit.

## 2018-08-25 NOTE — ED Triage Notes (Signed)
Pt reports slurred speech that occurred at ~4PM today, lasting for a couple of minutes. Pt went to her PCP today for tick bites to her left leg and noticed slurred speech when returning home. Pt states she is now at baseline and did not notice any other symptoms. No slurred speech, no arm drift, no facial droop.

## 2018-08-25 NOTE — ED Notes (Signed)
Patients son: Charene Wrobleski : [phone]

## 2018-08-25 NOTE — ED Notes (Signed)
Patient being transported to MRI

## 2018-08-25 NOTE — Discharge Instructions (Signed)
Based on your symptoms, we are concerned you had a TIA, transient ischemic attack.  As we discussed, we are concerned this may precede a larger stroke, thus, neurology recommended admission for further work-up and management with your multiple risk factors and comorbidities.  After our discussion, you want to leave AGAINST MEDICAL ADVICE.  Please follow-up with outpatient neurology and your primary doctor.  If any symptoms change or worsen, is return immediately to the nearest emergency department.

## 2018-08-25 NOTE — ED Notes (Signed)
Pt made aware of need for urine specimen. Pt was instructed to call using call bell when she could provide specimen.

## 2018-08-25 NOTE — Telephone Encounter (Signed)
Pt. Reports she got 4 ticks off her left leg several days ago.The left leg has a red and swollen area of 4x5 inches, warm to touch.Had a fever this morning 101.Pt. does not have an e-mail.Would like a telephone visit. Scheduled for this afternoon. Cailyn in the practice notified.  Reason for Disposition . [1] Red or very tender (to touch) area AND [2] started over 24 hours after the bite  Answer Assessment - Initial Assessment Questions 1. TYPE of TICK: "Is it a wood tick or a deer tick?" If unsure, ask: "What size was the tick?" "Did it look more like a watermelon seed or a poppy seed?"      Poppy seed 2. LOCATION: "Where is the tick bite located?"      Left leg 3. ONSET: "How long do you think the tick was attached before you removed it?" (Hours or days)      Maybe 2 hours 4. TETANUS: "When was the last tetanus booster?"      Unsure 5. PREGNANCY: "Is there any chance you are pregnant?" "When was your last menstrual period?"     No  Protocols used: TICK BITE-A-AH

## 2018-08-25 NOTE — ED Notes (Signed)
Pt refusing to be admitted. Dr. Julian Reil made aware.

## 2018-08-25 NOTE — ED Provider Notes (Signed)
[facility]-EMERGENCY DEPT Provider Note   CSN: [id number] Arrival date & time: 08/25/18  1711    History   Chief Complaint Chief Complaint  Patient presents with   Aphasia    HPI Emily Daniel is a 85 y.o. female.     The history is provided by the patient and medical records. No language interpreter was used.  Neurologic Problem  This is a new problem. The current episode started less than 1 hour ago. The problem occurs rarely. The problem has been resolved. Pertinent negatives include no chest pain, no abdominal pain, no headaches and no shortness of breath. Nothing aggravates the symptoms. Nothing relieves the symptoms. She has tried nothing for the symptoms. The treatment provided no relief.    Past Medical History:  Diagnosis Date   Anxiety    Atrial fibrillation (HCC)    post op >>> req'd Amio and Coumadin   AVM (arteriovenous malformation)    CAD (coronary artery disease)    a. NSTEMI 10/2013 >>> LHC (10/2013):  Distal left main 90%, proximal LAD 30%, mid LAD 20%, ostial circumflex 99%, then 80%, mid RCA 99%, EF 30-35%, 3-4+ MR; proximal right iliac occluded, right renal 50%, ostial left iliac 70% >>> CABG/MV repair   Cataract    CHF (congestive heart failure) (HCC)    Dr. Dorris Fetch following   Chronic systolic heart failure (HCC)    Diabetes mellitus, type 2 (HCC)    Hemorrhoids    Hx of echocardiogram    Echo (9/15):  Mild LVH, EF 40-45%, inf AK, Gr 1 DD, trivial AI, MV repair ok (mild MS, mild MR), mild LAE, PASP 32 mmHg.   Hyperlipidemia    Hypertension    Ischemic cardiomyopathy    a. Echo (11/08/13):  EF 30-35%, diffuse hypokinesis, severe MR, mild LAE, PASP 48 mm Hg   Mitral regurgitation    a. s/p MV repair at time of CABG   Osteoporosis    PVD (peripheral vascular disease) (HCC)    Vitamin B12 deficiency     Patient Active Problem List   Diagnosis Date Noted   Trochanteric bursitis, right hip 07/01/2016    Chronic right-sided low back pain without sciatica 07/01/2016   PAD (peripheral artery disease) (HCC) 08/09/2014   Leg pain 05/23/2014   Atrial fibrillation (HCC) 11/22/2013   Mitral valve disorders(424.0) 11/22/2013   S/P mitral valve repair 11/19/2013   S/P CABG x 3 11/11/2013   Chest pain 11/08/2013   NSTEMI (non-ST elevated myocardial infarction) (HCC) 11/08/2013   Iron deficiency anemia 11/08/2013   Acute heart failure (HCC) 11/08/2013   Cerumen impaction 09/30/2013   B12 DEFICIENCY 06/21/2010   Osteoporosis 03/16/2010   BACK PAIN 01/02/2010   PVD 04/14/2009   ARTERIOVENOUS MALFORMATION, COLON 04/14/2009   CHANGE IN BOWELS 04/14/2009   DM (diabetes mellitus) type II uncontrolled, periph vascular disorder (HCC) 03/13/2009   ANXIETY STATE, UNSPECIFIED 03/13/2009   DYSURIA 04/01/2008   Hyperlipidemia 05/18/2007   Essential hypertension 02/02/2007   ARTIFICIAL MENOPAUSE 02/02/2007    Past Surgical History:  Procedure Laterality Date   ABDOMINAL HYSTERECTOMY     1969   APPENDECTOMY     1945   CORONARY ARTERY BYPASS GRAFT  1990, 1995, 1998   HEMORRHOID SURGERY     1959   LEFT AND RIGHT HEART CATHETERIZATION WITH CORONARY ANGIOGRAM N/A 11/10/2013   Procedure: LEFT AND RIGHT HEART CATHETERIZATION WITH CORONARY ANGIOGRAM;  Surgeon: Lennette Bihari, MD;  Location: [facility] CATH LAB;  Service:  Cardiovascular;  Laterality: N/A;   MITRAL VALVE REPLACEMENT (MVR)/CORONARY ARTERY BYPASS GRAFTING (CABG) N/A 11/11/2013   Procedure: MITRAL VALVE Ring repair (MVR)/CORONARY ARTERY BYPASS GRAFTING (CABG) times three using left internal mammary and left sphenous vein.;  Surgeon: Loreli Slot, MD;  Location: [facility] OR;  Service: Open Heart Surgery;  Laterality: N/A;  Coronary artery bypass graft times three using left internal mammary artery and left saphenous leg vein using endoscope.  Exploration of right leg.      OB History   No obstetric history on file.       Home Medications    Prior to Admission medications   Medication Sig Start Date End Date Taking? Authorizing Provider  Aspirin (ECOTRIN LOW STRENGTH PO) Take by mouth.    [provider]  Calcium Carbonate-Vitamin D (CALCIUM-VITAMIN D) 600-200 MG-UNIT CAPS Take 1 capsule by mouth 2 (two) times daily.     [provider]  Casanthranol-Docusate Sodium 30-100 MG CAPS Take 1 capsule by mouth daily as needed (for constipation).     [provider]  doxycycline (VIBRA-TABS) 100 MG tablet Take 1 tablet (100 mg total) by mouth 2 (two) times daily. 08/25/18   Seabron Spates R, DO  ferrous sulfate 325 (65 FE) MG tablet Take 325 mg by mouth 2 (two) times daily with a meal.     [provider]  lisinopril (PRINIVIL,ZESTRIL) 40 MG tablet TAKE 1 TABLET BY MOUTH ONCE DAILY 08/25/17   Iran Ouch, MD  metoprolol tartrate (LOPRESSOR) 50 MG tablet TAKE 1 TABLET BY MOUTH TWICE DAILY 10/13/17   Donato Schultz, DO  Multiple Vitamin (MULTIVITAMIN) tablet Take 1 tablet by mouth daily.      [provider]  pravastatin (PRAVACHOL) 40 MG tablet Take 1 tablet (40 mg total) by mouth daily. 04/15/18   Donato Schultz, DO  sitaGLIPtin (JANUVIA) 100 MG tablet Take 1 tablet (100 mg total) by mouth daily. 04/28/18   Donato Schultz, DO  vitamin B-12 (CYANOCOBALAMIN) 1000 MCG tablet Take 1,000 mcg by mouth daily.      [provider]    Family History Family History  Problem Relation Age of Onset   Cervical cancer Mother    Cancer Brother        renal ...x3??   Hypertension Other    Stroke Other    Hypertension Sister    Heart attack Neg Hx     Social History Social History   Tobacco Use   Smoking status: Former Smoker    Packs/day: 1.00    Years: 36.00    Pack years: 36.00    Types: Cigarettes    Last attempt to quit: 02/18/1984    Years since quitting: 34.5   Smokeless tobacco: Never Used  Substance Use Topics    Alcohol use: No   Drug use: No     Allergies   Meperidine hcl; Sulfonamide derivatives; and Demerol [meperidine]   Review of Systems Review of Systems  Constitutional: Negative for chills, diaphoresis, fatigue and fever.  HENT: Negative for congestion.   Eyes: Negative for visual disturbance.  Respiratory: Negative for cough, chest tightness, shortness of breath and wheezing.   Cardiovascular: Negative for chest pain, palpitations and leg swelling.  Gastrointestinal: Negative for abdominal pain, constipation, diarrhea, nausea and vomiting.  Genitourinary: Negative for dysuria, flank pain and frequency.  Musculoskeletal: Negative for back pain, neck pain and neck stiffness.  Skin: Positive for rash (on L shin from bug bites).  Neurological: Positive for speech difficulty. Negative for dizziness, tremors, syncope, facial asymmetry, weakness, light-headedness, numbness and headaches.  Psychiatric/Behavioral: Negative for agitation.  All other systems reviewed and are negative.    Physical Exam Updated Vital Signs BP (!) 183/75 (BP Location: Left Arm)    Pulse 81    Temp 98.3 F (36.8 C) (Oral)    Resp 14    SpO2 98%   Physical Exam Vitals signs and nursing note reviewed.  Constitutional:      General: She is not in acute distress.    Appearance: She is well-developed. She is not ill-appearing, toxic-appearing or diaphoretic.  HENT:     Head: Normocephalic and atraumatic.     Right Ear: External ear normal.     Left Ear: External ear normal.     Nose: Nose normal. No congestion or rhinorrhea.     Mouth/Throat:     Mouth: Mucous membranes are moist.     Pharynx: No oropharyngeal exudate.  Eyes:     Conjunctiva/sclera: Conjunctivae normal.     Pupils: Pupils are equal, round, and reactive to light.  Neck:     Musculoskeletal: Normal range of motion and neck supple. No muscular tenderness.  Cardiovascular:     Rate and Rhythm: Normal rate.     Heart sounds: Murmur (faint  systolic) present.  Pulmonary:     Effort: Pulmonary effort is normal. No respiratory distress.     Breath sounds: Normal breath sounds. No stridor. No wheezing, rhonchi or rales.  Chest:     Chest wall: No tenderness.  Abdominal:     General: There is no distension.     Tenderness: There is no abdominal tenderness. There is no right CVA tenderness, left CVA tenderness or rebound.  Musculoskeletal:        General: No tenderness.     Right lower leg: No edema.     Left lower leg: No edema.  Skin:    General: Skin is warm.     Capillary Refill: Capillary refill takes less than 2 seconds.     Findings: Erythema and rash present. No bruising.  Neurological:     General: No focal deficit present.     Mental Status: She is alert and oriented to person, place, and time.     Cranial Nerves: No cranial nerve deficit.     Sensory: No sensory deficit.     Motor: No weakness or abnormal muscle tone.     Coordination: Coordination normal.     Gait: Gait normal.     Deep Tendon Reflexes: Reflexes are normal and symmetric.  Psychiatric:        Mood and Affect: Mood normal.      ED Treatments / Results  Labs (all labs ordered are listed, but only abnormal results are displayed) Labs Reviewed  COMPREHENSIVE METABOLIC PANEL - Abnormal; Notable for the following components:      Result Value   Glucose, Bld 145 (*)    BUN 30 (*)    Creatinine, Ser 1.65 (*)    GFR calc non Af Amer 28 (*)    GFR calc Af Amer 32 (*)    All other components within normal limits  URINALYSIS, ROUTINE W REFLEX MICROSCOPIC - Abnormal; Notable for the following components:   Color, Urine AMBER (*)    Ketones, ur 5 (*)    Protein, ur 30 (*)    Leukocytes,Ua LARGE (*)    WBC, UA >50 (*)    All other  components within normal limits  CBC WITH DIFFERENTIAL/PLATELET - Abnormal; Notable for the following components:   RBC 2.96 (*)    Hemoglobin 10.3 (*)    HCT 31.5 (*)    MCV 106.4 (*)    MCH 34.8 (*)     Monocytes Absolute 1.5 (*)    All other components within normal limits  CBG MONITORING, ED - Abnormal; Notable for the following components:   Glucose-Capillary 133 (*)    All other components within normal limits  URINE CULTURE  ETHANOL  PROTIME-INR  APTT  RAPID URINE DRUG SCREEN, HOSP PERFORMED  CBC  DIFFERENTIAL  HEMOGLOBIN A1C  LIPID PANEL    EKG EKG Interpretation  Date/Time:  Tuesday August 25 2018 17:31:26 EDT Ventricular Rate:  71 PR Interval:    QRS Duration: 127 QT Interval:  427 QTC Calculation: 464 R Axis:   83 Text Interpretation:  Sinus rhythm Atrial premature complex Prolonged PR interval Nonspecific intraventricular conduction delay Minimal ST depression, lateral leads When compared to prior,  longer PR.  No STEMI Confirmed by Theda Belfastegeler, Chris ([id number]) on 08/25/2018 5:37:33 PM   Radiology Mr Brain Wo Contrast  Result Date: 08/25/2018 CLINICAL DATA:  Transient slurred speech this afternoon. History of tick bite, AVM, diabetes, hypertension and hyperlipidemia. EXAM: MRI HEAD WITHOUT CONTRAST TECHNIQUE: Multiplanar, multiecho pulse sequences of the brain and surrounding structures were obtained without intravenous contrast. COMPARISON:  None. FINDINGS: Mild motion degraded examination. INTRACRANIAL CONTENTS: No reduced diffusion to suggest acute ischemia a few scattered chronic microhemorrhages noted. No parenchymal brain volume loss for age. No hydrocephalus. Old bilateral small cerebellar infarcts. Old LEFT thalamus and possible basal ganglia lacunar infarcts. Confluent supratentorial and patchy pontine white matter FLAIR T2 hyperintensities. Prominent basal ganglia perivascular spaces associated with chronic small vessel ischemic changes. Small bilateral frontal convexity encephalomalacia versus motion artifact. No suspicious parenchymal signal, masses, mass effect. No abnormal extra-axial fluid collections. No extra-axial masses. VASCULAR: Normal major intracranial  vascular flow voids present at skull base. SKULL AND UPPER CERVICAL SPINE: No abnormal sellar expansion. No suspicious calvarial bone marrow signal. Craniocervical junction maintained. SINUSES/ORBITS: Included paranasal sinuses are well aerated. Minimal mastoid effusions. Included ocular globes and orbital contents are non-suspicious. Status post bilateral ocular lens implants. OTHER: None. IMPRESSION: 1. Mild motion degraded examination.  No acute intracranial process. 2. Moderate to severe chronic small vessel ischemic changes. Old cerebellar and deep gray nuclei infarcts. Old small frontal infarcts versus motion artifact. Electronically Signed   By: Awilda Metroourtnay  Bloomer M.D.   On: 08/25/2018 19:35    Procedures Procedures (including critical care time)  Medications Ordered in ED Medications  metoprolol tartrate (LOPRESSOR) tablet 50 mg (has no administration in time range)  ferrous sulfate tablet 325 mg (has no administration in time range)  doxycycline (VIBRA-TABS) tablet 100 mg (has no administration in time range)  Calcium-Vitamin D 600-200 MG-UNIT CAPS 1 capsule (has no administration in time range)  tetrahydrozoline 0.05 % ophthalmic solution 1 drop (has no administration in time range)  lisinopril (PRINIVIL,ZESTRIL) tablet 40 mg (has no administration in time range)  linagliptin (TRADJENTA) tablet 5 mg (has no administration in time range)  pravastatin (PRAVACHOL) tablet 40 mg (has no administration in time range)  multivitamin tablet 1 tablet (has no administration in time range)  vitamin B-12 (CYANOCOBALAMIN) tablet 1,000 mcg (has no administration in time range)   stroke: mapping our early stages of recovery book (has no administration in time range)  acetaminophen (TYLENOL) tablet 650 mg (has no administration in  time range)    Or  acetaminophen (TYLENOL) solution 650 mg (has no administration in time range)    Or  acetaminophen (TYLENOL) suppository 650 mg (has no administration in  time range)  senna-docusate (Senokot-S) tablet 1 tablet (has no administration in time range)  enoxaparin (LOVENOX) injection 40 mg (has no administration in time range)  aspirin suppository 300 mg (has no administration in time range)    Or  aspirin tablet 325 mg (has no administration in time range)     Initial Impression / Assessment and Plan / ED Course  I have reviewed the triage vital signs and the nursing notes.  Pertinent labs & imaging results that were available during my care of the patient were reviewed by me and considered in my medical decision making (see chart for details).        JENIPHER HAVEL is a 85 y.o. female with a past medical history significant for anxiety, CAD status post CABG, peripheral artery disease, CHF, diabetes, intermittent atrial fibrillation not on anticoagulation, AVMs, hyperlipidemia, and prior tobacco abuse who presents with transient aphasia.  Patient reports that approximate 1 hour to arrival, patient had several minutes of expressive aphasia and could not get words out.  She reports he is never had this before.  No history of stroke or TIA in the past.  She reports no numbness, tingling, weakness of extremities.  No headache, double vision, neck pain or neck stiffness.  She denies any difficulty with gait or ambulation.  She reports the symptoms past and she is back to her baseline.  She presents for evaluation of stroke versus TIA.  She reports that 2 weeks ago she was bitten by several ticks on her left leg and subsequently developed erythema.  Patient saw the doctor today who was going to start her on doxycycline.  Patient had not started the medication yet.  She denies other complaints including no fevers, chills, chest pain, shortness breath, palpitations, nausea vomiting, urinary symptoms or GI symptoms.  Patient otherwise doing well.  Patient is back at her baseline will monitor evaluation.  On exam, patient had no focal neurologic deficits.   Pupils were symmetric and reactive with normal extraocular movements.  Speech was clear with normal face sensation.  Normal finger-nose-finger testing and gait.  Normal sensation and strength in all extremities.  Patient has erythema on her left shin and lower leg which is the cellulitis and she will be starting doxycycline by her PCP for.  She otherwise has clear speech with no dysarthria.  Patient resting comfortably and is at her reported baseline.  EKG shows prolonged PR but otherwise no evidence of A. fib at this time.  No STEMI or other arrhythmia.  Initial glucose on arrival was 133. She is not hypoglycemic.   Clinically I am concerned patient had a TIA.  With her lack of headache, vision changes, persistent symptoms, have lower suspicion for intracranial bleed.  As it is daytime and MRI is available currently, will get MRI to look for stroke.  Anticipate touching base with neurology after to determine disposition.  Patient is high risk for stroke and TIA given her past medical history as well as the documented intermittent A. fib in the past and no anticoagulation currently.  Anticipate reassessment after work-up.  MRI showed chronic microvascular changes and old strokes however no acute stroke was seen.  Neurology was called who recommended admission for full stroke/TIA work-up.  9:54 PM Initially, patient agreed to admission for TIA  work-up however patient is now refusing.  Patient understands the risks of death and debilitation from recurrent stroke and she is not on blood thinners at this time.  We are also concerned about the discovery of carotid stenosis on recent ultrasound may have contributed to her episode.  Patient understands all these risks and death and still wants to leave AGAINST MEDICAL ADVICE.  Patient son was on the phone when we had this conversation.  Patient will leave AMA and will be instructed to follow-up with outpatient neurology and PCP.   Final Clinical  Impressions(s) / ED Diagnoses   Final diagnoses:  TIA (transient ischemic attack)  Transient speech disturbance    ED Discharge Orders    None      Clinical Impression: 1. TIA (transient ischemic attack)   2. Transient speech disturbance     Disposition: AMA  Condition: Stable   Discharge Medication List as of 08/25/2018  9:56 PM      Follow Up: [facility] [address]     [address] [place] [address] [phone]    Donato Schultz, DO [address] [facility] [address] [place] [address] [phone]     [facility] [address] [id number] [address]c [place] [address] [phone]       Justyn Boyson, Canary Brim, MD  Jacinta Shoe

## 2018-08-27 ENCOUNTER — Telehealth: Payer: Self-pay | Admitting: Cardiovascular Disease

## 2018-08-27 LAB — ROCKY MTN SPOTTED FVR ABS PNL(IGG+IGM)
RMSF IgG: NOT DETECTED
RMSF IgM: NOT DETECTED

## 2018-08-27 LAB — B. BURGDORFI ANTIBODIES: B burgdorferi Ab IgG+IgM: <0.9 index

## 2018-08-27 NOTE — Telephone Encounter (Signed)
Pt bit by ticks last week. Pulled at least 6 ticks off the back of her left leg. PCP gave her antibiotics (doxicyclone 100 mg, 2x daily). This morning, she was unable to walk in her left leg, and her toes on her right foot were purple.   03/31 pt was taken to the hospital for inability to speak. MRI was done in the hospital, but the results came back normal (or so the family says).  Hospital provider was wondering why the pt was not already on bloodthinners. Providers wanted to admit pt to the hospital, but she refused.  Family wants to know if the pt should be seen by Dr. Kirke Corin to start bloodthinners, and to follow up for events of the previous week.

## 2018-08-27 NOTE — Telephone Encounter (Signed)
She does not need a blood thinner. She only had A-fib post op and none recently. She should continue to follow up closely with PCP regarding left leg cellulitis.

## 2018-08-28 ENCOUNTER — Telehealth: Payer: Self-pay

## 2018-08-28 LAB — URINE CULTURE: Culture: >=100000 — AB

## 2018-08-28 NOTE — Telephone Encounter (Signed)
Left a message for the patient to call back.  

## 2018-08-28 NOTE — Telephone Encounter (Signed)
Follow Up   Pts son is returning call   Please call back

## 2018-08-28 NOTE — Telephone Encounter (Signed)
She has app coming up

## 2018-08-28 NOTE — Telephone Encounter (Signed)
Transition Care Management Follow-up Telephone Call  ADMISSION DATE: 08/25/18  (To ED)  DISCHARGE DATE: 08/25/18   How have you been since you were released from the hospital? Per son patients L foot is still swollen.   Do you understand why you were in the hospital? Yes    Do you understand the discharge instrcutions? Yes    Items Reviewed:  Medications reviewed: Yes  Allergies reviewed: Yes  Dietary changes reviewed:  Diabetic, Low salt heart healthy   Referrals reviewed: Waiting on call from Neurologist   Functional Questionnaire:   Activities of Daily Living (ADLs): Patient needs assistance at this time. Uses walker because of swelling in foot.  Any patient concerns? L foot swelling worsening   Confirmed importance and date/time of follow-up visits scheduled: Yws    Confirmed with patient if condition begins to worsen call PCP or go to the ER. Yes     Patient was given the office number and encouragred to call back with questions or concerns. Yes

## 2018-08-29 ENCOUNTER — Other Ambulatory Visit: Payer: Self-pay

## 2018-08-29 ENCOUNTER — Inpatient Hospital Stay (HOSPITAL_COMMUNITY)
Admission: EM | Admit: 2018-08-29 | Discharge: 2018-09-09 | DRG: 299 | Disposition: A | Discharge status: Hospice | Payer: Medicare Other | Attending: Internal Medicine | Admitting: Internal Medicine

## 2018-08-29 ENCOUNTER — Emergency Department (HOSPITAL_BASED_OUTPATIENT_CLINIC_OR_DEPARTMENT_OTHER): Payer: Medicare Other

## 2018-08-29 ENCOUNTER — Encounter (HOSPITAL_COMMUNITY): Payer: Self-pay

## 2018-08-29 DIAGNOSIS — G9341 Metabolic encephalopathy: Secondary | ICD-10-CM | POA: Diagnosis not present

## 2018-08-29 DIAGNOSIS — W57XXXA Bitten or stung by nonvenomous insect and other nonvenomous arthropods, initial encounter: Secondary | ICD-10-CM

## 2018-08-29 DIAGNOSIS — I63512 Cerebral infarction due to unspecified occlusion or stenosis of left middle cerebral artery: Secondary | ICD-10-CM

## 2018-08-29 DIAGNOSIS — I252 Old myocardial infarction: Secondary | ICD-10-CM

## 2018-08-29 DIAGNOSIS — E1151 Type 2 diabetes mellitus with diabetic peripheral angiopathy without gangrene: Secondary | ICD-10-CM | POA: Diagnosis not present

## 2018-08-29 DIAGNOSIS — Z87891 Personal history of nicotine dependence: Secondary | ICD-10-CM

## 2018-08-29 DIAGNOSIS — B952 Enterococcus as the cause of diseases classified elsewhere: Secondary | ICD-10-CM | POA: Diagnosis present

## 2018-08-29 DIAGNOSIS — Z885 Allergy status to narcotic agent status: Secondary | ICD-10-CM

## 2018-08-29 DIAGNOSIS — R402412 Glasgow coma scale score 13-15, at arrival to emergency department: Secondary | ICD-10-CM | POA: Diagnosis present

## 2018-08-29 DIAGNOSIS — I255 Ischemic cardiomyopathy: Secondary | ICD-10-CM | POA: Diagnosis not present

## 2018-08-29 DIAGNOSIS — N179 Acute kidney failure, unspecified: Secondary | ICD-10-CM | POA: Diagnosis present

## 2018-08-29 DIAGNOSIS — Z8249 Family history of ischemic heart disease and other diseases of the circulatory system: Secondary | ICD-10-CM

## 2018-08-29 DIAGNOSIS — R402134 Coma scale, eyes open, to sound, 24 hours or more after hospital admission: Secondary | ICD-10-CM | POA: Diagnosis not present

## 2018-08-29 DIAGNOSIS — Q2730 Arteriovenous malformation, site unspecified: Secondary | ICD-10-CM | POA: Diagnosis not present

## 2018-08-29 DIAGNOSIS — Z515 Encounter for palliative care: Secondary | ICD-10-CM

## 2018-08-29 DIAGNOSIS — N3000 Acute cystitis without hematuria: Secondary | ICD-10-CM

## 2018-08-29 DIAGNOSIS — K661 Hemoperitoneum: Secondary | ICD-10-CM | POA: Diagnosis not present

## 2018-08-29 DIAGNOSIS — I10 Essential (primary) hypertension: Secondary | ICD-10-CM | POA: Diagnosis not present

## 2018-08-29 DIAGNOSIS — Z8673 Personal history of transient ischemic attack (TIA), and cerebral infarction without residual deficits: Secondary | ICD-10-CM

## 2018-08-29 DIAGNOSIS — Z66 Do not resuscitate: Secondary | ICD-10-CM | POA: Diagnosis not present

## 2018-08-29 DIAGNOSIS — I639 Cerebral infarction, unspecified: Secondary | ICD-10-CM | POA: Diagnosis not present

## 2018-08-29 DIAGNOSIS — R29721 NIHSS score 21: Secondary | ICD-10-CM | POA: Diagnosis not present

## 2018-08-29 DIAGNOSIS — R58 Hemorrhage, not elsewhere classified: Secondary | ICD-10-CM

## 2018-08-29 DIAGNOSIS — N390 Urinary tract infection, site not specified: Secondary | ICD-10-CM | POA: Diagnosis present

## 2018-08-29 DIAGNOSIS — R402234 Coma scale, best verbal response, inappropriate words, 24 hours or more after hospital admission: Secondary | ICD-10-CM | POA: Diagnosis not present

## 2018-08-29 DIAGNOSIS — I771 Stricture of artery: Secondary | ICD-10-CM | POA: Diagnosis not present

## 2018-08-29 DIAGNOSIS — D72829 Elevated white blood cell count, unspecified: Secondary | ICD-10-CM | POA: Diagnosis not present

## 2018-08-29 DIAGNOSIS — R609 Edema, unspecified: Secondary | ICD-10-CM | POA: Diagnosis not present

## 2018-08-29 DIAGNOSIS — I5042 Chronic combined systolic (congestive) and diastolic (congestive) heart failure: Secondary | ICD-10-CM | POA: Diagnosis present

## 2018-08-29 DIAGNOSIS — R52 Pain, unspecified: Secondary | ICD-10-CM

## 2018-08-29 DIAGNOSIS — R4701 Aphasia: Secondary | ICD-10-CM | POA: Diagnosis present

## 2018-08-29 DIAGNOSIS — Z7982 Long term (current) use of aspirin: Secondary | ICD-10-CM

## 2018-08-29 DIAGNOSIS — Z951 Presence of aortocoronary bypass graft: Secondary | ICD-10-CM

## 2018-08-29 DIAGNOSIS — I509 Heart failure, unspecified: Secondary | ICD-10-CM

## 2018-08-29 DIAGNOSIS — E1165 Type 2 diabetes mellitus with hyperglycemia: Secondary | ICD-10-CM | POA: Diagnosis not present

## 2018-08-29 DIAGNOSIS — I110 Hypertensive heart disease with heart failure: Secondary | ICD-10-CM | POA: Diagnosis present

## 2018-08-29 DIAGNOSIS — I081 Rheumatic disorders of both mitral and tricuspid valves: Secondary | ICD-10-CM | POA: Diagnosis present

## 2018-08-29 DIAGNOSIS — I611 Nontraumatic intracerebral hemorrhage in hemisphere, cortical: Secondary | ICD-10-CM | POA: Diagnosis not present

## 2018-08-29 DIAGNOSIS — I959 Hypotension, unspecified: Secondary | ICD-10-CM | POA: Diagnosis present

## 2018-08-29 DIAGNOSIS — K649 Unspecified hemorrhoids: Secondary | ICD-10-CM | POA: Diagnosis not present

## 2018-08-29 DIAGNOSIS — G510 Bell's palsy: Secondary | ICD-10-CM | POA: Diagnosis not present

## 2018-08-29 DIAGNOSIS — Z888 Allergy status to other drugs, medicaments and biological substances status: Secondary | ICD-10-CM

## 2018-08-29 DIAGNOSIS — S80862A Insect bite (nonvenomous), left lower leg, initial encounter: Secondary | ICD-10-CM

## 2018-08-29 DIAGNOSIS — IMO0002 Reserved for concepts with insufficient information to code with codable children: Secondary | ICD-10-CM | POA: Diagnosis present

## 2018-08-29 DIAGNOSIS — I2510 Atherosclerotic heart disease of native coronary artery without angina pectoris: Secondary | ICD-10-CM | POA: Diagnosis present

## 2018-08-29 DIAGNOSIS — R402364 Coma scale, best motor response, obeys commands, 24 hours or more after hospital admission: Secondary | ICD-10-CM | POA: Diagnosis not present

## 2018-08-29 DIAGNOSIS — D62 Acute posthemorrhagic anemia: Secondary | ICD-10-CM | POA: Diagnosis not present

## 2018-08-29 DIAGNOSIS — I63412 Cerebral infarction due to embolism of left middle cerebral artery: Secondary | ICD-10-CM | POA: Diagnosis not present

## 2018-08-29 DIAGNOSIS — Z7984 Long term (current) use of oral hypoglycemic drugs: Secondary | ICD-10-CM

## 2018-08-29 DIAGNOSIS — L03116 Cellulitis of left lower limb: Secondary | ICD-10-CM | POA: Diagnosis present

## 2018-08-29 DIAGNOSIS — I63232 Cerebral infarction due to unspecified occlusion or stenosis of left carotid arteries: Secondary | ICD-10-CM | POA: Diagnosis not present

## 2018-08-29 DIAGNOSIS — I6522 Occlusion and stenosis of left carotid artery: Secondary | ICD-10-CM | POA: Diagnosis not present

## 2018-08-29 DIAGNOSIS — R410 Disorientation, unspecified: Secondary | ICD-10-CM | POA: Diagnosis not present

## 2018-08-29 DIAGNOSIS — I480 Paroxysmal atrial fibrillation: Secondary | ICD-10-CM | POA: Diagnosis present

## 2018-08-29 DIAGNOSIS — E538 Deficiency of other specified B group vitamins: Secondary | ICD-10-CM | POA: Diagnosis present

## 2018-08-29 DIAGNOSIS — G8191 Hemiplegia, unspecified affecting right dominant side: Secondary | ICD-10-CM | POA: Diagnosis not present

## 2018-08-29 DIAGNOSIS — M810 Age-related osteoporosis without current pathological fracture: Secondary | ICD-10-CM | POA: Diagnosis not present

## 2018-08-29 DIAGNOSIS — E785 Hyperlipidemia, unspecified: Secondary | ICD-10-CM | POA: Diagnosis not present

## 2018-08-29 DIAGNOSIS — D649 Anemia, unspecified: Secondary | ICD-10-CM | POA: Diagnosis not present

## 2018-08-29 DIAGNOSIS — Z823 Family history of stroke: Secondary | ICD-10-CM

## 2018-08-29 DIAGNOSIS — F411 Generalized anxiety disorder: Secondary | ICD-10-CM | POA: Diagnosis present

## 2018-08-29 DIAGNOSIS — I4891 Unspecified atrial fibrillation: Secondary | ICD-10-CM | POA: Diagnosis present

## 2018-08-29 DIAGNOSIS — R079 Chest pain, unspecified: Secondary | ICD-10-CM | POA: Diagnosis not present

## 2018-08-29 DIAGNOSIS — Z79899 Other long term (current) drug therapy: Secondary | ICD-10-CM

## 2018-08-29 DIAGNOSIS — D509 Iron deficiency anemia, unspecified: Secondary | ICD-10-CM | POA: Diagnosis present

## 2018-08-29 DIAGNOSIS — Z882 Allergy status to sulfonamides status: Secondary | ICD-10-CM

## 2018-08-29 DIAGNOSIS — I2693 Single subsegmental pulmonary embolism without acute cor pulmonale: Secondary | ICD-10-CM | POA: Diagnosis not present

## 2018-08-29 DIAGNOSIS — Z8049 Family history of malignant neoplasm of other genital organs: Secondary | ICD-10-CM

## 2018-08-29 LAB — CBC WITH DIFFERENTIAL/PLATELET
Abs Immature Granulocytes: 0.06 10*3/uL (ref 0.00–0.07)
Basophils Absolute: 0 10*3/uL (ref 0.0–0.1)
Basophils Relative: 0 %
Eosinophils Absolute: 0 10*3/uL (ref 0.0–0.5)
Eosinophils Relative: 1 %
HCT: 29.2 % — ABNORMAL LOW (ref 36.0–46.0)
Hemoglobin: 9.9 g/dL — ABNORMAL LOW (ref 12.0–15.0)
Immature Granulocytes: 1 %
Lymphocytes Relative: 22 %
Lymphs Abs: 1.7 10*3/uL (ref 0.7–4.0)
MCH: 34.6 pg — ABNORMAL HIGH (ref 26.0–34.0)
MCHC: 33.9 g/dL (ref 30.0–36.0)
MCV: 102.1 fL — ABNORMAL HIGH (ref 80.0–100.0)
Monocytes Absolute: 1.1 10*3/uL — ABNORMAL HIGH (ref 0.1–1.0)
Monocytes Relative: 14 %
Neutro Abs: 4.9 10*3/uL (ref 1.7–7.7)
Neutrophils Relative %: 62 %
Platelets: 239 10*3/uL (ref 150–400)
RBC: 2.86 MIL/uL — ABNORMAL LOW (ref 3.87–5.11)
RDW: 11.9 % (ref 11.5–15.5)
WBC: 7.8 10*3/uL (ref 4.0–10.5)
nRBC: 0 % (ref 0.0–0.2)

## 2018-08-29 LAB — HEMOGLOBIN A1C
Hgb A1c MFr Bld: 5.7 % — ABNORMAL HIGH (ref 4.8–5.6)
Mean Plasma Glucose: 116.89 mg/dL

## 2018-08-29 LAB — BASIC METABOLIC PANEL
Anion gap: 11 (ref 5–15)
BUN: 18 mg/dL (ref 8–23)
CO2: 21 mmol/L — ABNORMAL LOW (ref 22–32)
Calcium: 9.5 mg/dL (ref 8.9–10.3)
Chloride: 109 mmol/L (ref 98–111)
Creatinine, Ser: 1.03 mg/dL — ABNORMAL HIGH (ref 0.44–1.00)
GFR calc Af Amer: 57 mL/min — ABNORMAL LOW (ref >60)
GFR calc non Af Amer: 49 mL/min — ABNORMAL LOW (ref >60)
Glucose, Bld: 132 mg/dL — ABNORMAL HIGH (ref 70–99)
Potassium: 3.7 mmol/L (ref 3.5–5.1)
Sodium: 141 mmol/L (ref 135–145)

## 2018-08-29 LAB — URINALYSIS, ROUTINE W REFLEX MICROSCOPIC
Bacteria, UA: NONE SEEN
Bilirubin Urine: NEGATIVE
Glucose, UA: NEGATIVE mg/dL
Hgb urine dipstick: NEGATIVE
Ketones, ur: NEGATIVE mg/dL
Nitrite: NEGATIVE
Protein, ur: 30 mg/dL — AB
Specific Gravity, Urine: 1.018 (ref 1.005–1.030)
pH: 5 (ref 5.0–8.0)

## 2018-08-29 LAB — GLUCOSE, CAPILLARY
Glucose-Capillary: 217 mg/dL — ABNORMAL HIGH (ref 70–99)
Glucose-Capillary: 119 mg/dL — ABNORMAL HIGH (ref 70–99)

## 2018-08-29 MED ORDER — PRAVASTATIN SODIUM 40 MG PO TABS
40.0000 mg | ORAL_TABLET | Freq: Every day | ORAL | Status: DC
  Administered 2018-08-29 – 2018-09-06 (×7): 40 mg via ORAL
  Filled 2018-08-29 (×7): qty 1

## 2018-08-29 MED ORDER — VITAMIN B-12 1000 MCG PO TABS
1000.0000 ug | ORAL_TABLET | Freq: Every day | ORAL | Status: DC
  Administered 2018-08-30: 09:00:00 1000 ug via ORAL
  Filled 2018-08-29 (×2): qty 1

## 2018-08-29 MED ORDER — ONDANSETRON HCL 4 MG/2ML IJ SOLN
4.0000 mg | Freq: Four times a day (QID) | INTRAMUSCULAR | Status: DC | PRN
  Administered 2018-08-31 – 2018-09-03 (×3): 4 mg via INTRAVENOUS
  Filled 2018-08-29 (×3): qty 2

## 2018-08-29 MED ORDER — SENNOSIDES-DOCUSATE SODIUM 8.6-50 MG PO TABS
1.0000 | ORAL_TABLET | Freq: Every day | ORAL | Status: DC
  Administered 2018-08-30: 1 via ORAL
  Filled 2018-08-29: qty 1

## 2018-08-29 MED ORDER — CLONIDINE HCL 0.2 MG PO TABS
0.2000 mg | ORAL_TABLET | Freq: Once | ORAL | Status: AC
  Administered 2018-08-29: 0.2 mg via ORAL
  Filled 2018-08-29: qty 1

## 2018-08-29 MED ORDER — LISINOPRIL 20 MG PO TABS
40.0000 mg | ORAL_TABLET | Freq: Every day | ORAL | Status: DC
  Administered 2018-08-30: 40 mg via ORAL
  Filled 2018-08-29: qty 1

## 2018-08-29 MED ORDER — DOXYCYCLINE HYCLATE 100 MG PO TABS
100.0000 mg | ORAL_TABLET | Freq: Two times a day (BID) | ORAL | Status: DC
  Administered 2018-08-29 – 2018-08-30 (×3): 100 mg via ORAL
  Filled 2018-08-29 (×4): qty 1

## 2018-08-29 MED ORDER — FERROUS SULFATE 325 (65 FE) MG PO TABS
325.0000 mg | ORAL_TABLET | Freq: Two times a day (BID) | ORAL | Status: DC
  Administered 2018-08-29 – 2018-08-30 (×3): 325 mg via ORAL
  Filled 2018-08-29 (×3): qty 1

## 2018-08-29 MED ORDER — METOPROLOL TARTRATE 50 MG PO TABS
50.0000 mg | ORAL_TABLET | Freq: Two times a day (BID) | ORAL | Status: DC
  Administered 2018-08-29 – 2018-08-30 (×3): 50 mg via ORAL
  Filled 2018-08-29 (×3): qty 1

## 2018-08-29 MED ORDER — ONDANSETRON HCL 4 MG PO TABS: 4.0000 mg | ORAL_TABLET | Freq: Four times a day (QID) | ORAL | Status: DC | PRN

## 2018-08-29 MED ORDER — OXYCODONE HCL 5 MG PO TABS: 5.0000 mg | ORAL_TABLET | ORAL | Status: DC | PRN

## 2018-08-29 MED ORDER — ACETAMINOPHEN 325 MG PO TABS: 650.0000 mg | ORAL_TABLET | Freq: Four times a day (QID) | ORAL | Status: DC | PRN

## 2018-08-29 MED ORDER — TETRAHYDROZOLINE HCL 0.05 % OP SOLN
1.0000 [drp] | Freq: Two times a day (BID) | OPHTHALMIC | Status: DC
  Administered 2018-08-29 – 2018-09-06 (×17): 1 [drp] via OPHTHALMIC
  Filled 2018-08-29: qty 15

## 2018-08-29 MED ORDER — SODIUM CHLORIDE 0.9% FLUSH: 3.0000 mL | INTRAVENOUS | Status: DC | PRN

## 2018-08-29 MED ORDER — ENOXAPARIN SODIUM 40 MG/0.4ML ~~LOC~~ SOLN
40.0000 mg | SUBCUTANEOUS | Status: DC
  Administered 2018-08-29: 40 mg via SUBCUTANEOUS
  Filled 2018-08-29: qty 0.4

## 2018-08-29 MED ORDER — SODIUM CHLORIDE 0.9 % IV SOLN
250.0000 mL | INTRAVENOUS | Status: DC | PRN
  Administered 2018-08-31: 09:00:00 50 mL via INTRAVENOUS

## 2018-08-29 MED ORDER — POLYETHYLENE GLYCOL 3350 17 G PO PACK: 17.0000 g | PACK | Freq: Every day | ORAL | Status: DC | PRN

## 2018-08-29 MED ORDER — INSULIN ASPART 100 UNIT/ML ~~LOC~~ SOLN: 0.0000 [IU] | Freq: Three times a day (TID) | SUBCUTANEOUS | Status: DC

## 2018-08-29 MED ORDER — ASPIRIN EC 81 MG PO TBEC
81.0000 mg | DELAYED_RELEASE_TABLET | Freq: Every day | ORAL | Status: DC
  Administered 2018-08-29 – 2018-08-30 (×2): 81 mg via ORAL
  Filled 2018-08-29 (×2): qty 1

## 2018-08-29 MED ORDER — ADULT MULTIVITAMIN W/MINERALS CH
1.0000 | ORAL_TABLET | Freq: Every day | ORAL | Status: DC
  Administered 2018-08-30: 09:00:00 1 via ORAL
  Filled 2018-08-29: qty 1

## 2018-08-29 MED ORDER — ACETAMINOPHEN 650 MG RE SUPP: 650.0000 mg | Freq: Four times a day (QID) | RECTAL | Status: DC | PRN

## 2018-08-29 MED ORDER — INSULIN ASPART 100 UNIT/ML ~~LOC~~ SOLN
0.0000 [IU] | Freq: Three times a day (TID) | SUBCUTANEOUS | Status: DC
  Administered 2018-08-29: 19:00:00 3 [IU] via SUBCUTANEOUS
  Administered 2018-08-30 (×2): 1 [IU] via SUBCUTANEOUS

## 2018-08-29 MED ORDER — CALCIUM CARBONATE-VITAMIN D 500-200 MG-UNIT PO TABS
ORAL_TABLET | Freq: Two times a day (BID) | ORAL | Status: DC
  Administered 2018-08-29 – 2018-08-30 (×2): 1 via ORAL
  Administered 2018-08-30: 09:00:00 via ORAL
  Administered 2018-09-01 – 2018-09-04 (×7): 1 via ORAL
  Administered 2018-09-05: 23:00:00 via ORAL
  Administered 2018-09-05 – 2018-09-06 (×2): 1 via ORAL
  Filled 2018-08-29 (×13): qty 1

## 2018-08-29 MED ORDER — SODIUM CHLORIDE 0.9% FLUSH
3.0000 mL | Freq: Two times a day (BID) | INTRAVENOUS | Status: DC
  Administered 2018-08-29 – 2018-09-06 (×16): 3 mL via INTRAVENOUS

## 2018-08-29 MED ORDER — AMOXICILLIN 500 MG PO CAPS
500.0000 mg | ORAL_CAPSULE | Freq: Three times a day (TID) | ORAL | Status: DC
  Administered 2018-08-29 – 2018-08-31 (×5): 500 mg via ORAL
  Filled 2018-08-29 (×6): qty 1

## 2018-08-29 NOTE — Progress Notes (Addendum)
Patient trasfered from ED to 5W17 via stretcher; alert and oriented x 2; no complaints of pain; IV saline locked in LFA; skin - left leg red, warm to touch, with edema; redness on buttocks, blanchable. Orient patient to room and unit; gave patient care guide; instructed how to use the call bell and  fall risk precautions. Will continue to monitor the patient.

## 2018-08-29 NOTE — ED Provider Notes (Signed)
[facility] EMERGENCY DEPARTMENT Provider Note   CSN: [id number] Arrival date & time: 08/29/18  1319    History   Chief Complaint Chief Complaint  Patient presents with   Altered Mental Status   Leg Swelling    HPI  Emily Daniel is a 85 y.o. female who presents with confusion. PMH of anxiety, CAD status post CABG, peripheral artery disease, CHF, diabetes, intermittent atrial fibrillation not on anticoagulation, AVMs, hyperlipidemia, and prior tobacco abuse. The patient is confused and can only provide a limited history. She is not sure why she is here. She thinks it's because of her memory. She was diagnosed with a tick borne illness on 3/31. She had a telemedicine appointment with her PCP and was prescribed Doxycycline. She states she has been taking this as prescribed. She was seen in the ED that evening because she had an episode of garbled speech per her son who witnessed the event. She had an overall reassuring work up but was recommended to be admitted for TIA work up but she decided to leave AMA. On discussion with her son [name], he reports he is concerned because her leg appears to be worsening. It is more red and swollen. He also notes that she is becoming increasingly confused. They have been checking her temp and it has been normal. She denies chest pain, SOB, abdominal pain. She does reports some urgency symptoms but no dysuria or frequency.  [phone] [name] - son)  HPI  Past Medical History:  Diagnosis Date   Anxiety    Atrial fibrillation (HCC)    post op >>> req'd Amio and Coumadin   AVM (arteriovenous malformation)    CAD (coronary artery disease)    a. NSTEMI 10/2013 >>> LHC (10/2013):  Distal left main 90%, proximal LAD 30%, mid LAD 20%, ostial circumflex 99%, then 80%, mid RCA 99%, EF 30-35%, 3-4+ MR; proximal right iliac occluded, right renal 50%, ostial left iliac 70% >>> CABG/MV repair   Cataract    CHF (congestive heart failure)  (HCC)    Dr. Dorris Fetch following   Chronic systolic heart failure (HCC)    Diabetes mellitus, type 2 (HCC)    Hemorrhoids    Hx of echocardiogram    Echo (9/15):  Mild LVH, EF 40-45%, inf AK, Gr 1 DD, trivial AI, MV repair ok (mild MS, mild MR), mild LAE, PASP 32 mmHg.   Hyperlipidemia    Hypertension    Ischemic cardiomyopathy    a. Echo (11/08/13):  EF 30-35%, diffuse hypokinesis, severe MR, mild LAE, PASP 48 mm Hg   Mitral regurgitation    a. s/p MV repair at time of CABG   Osteoporosis    PVD (peripheral vascular disease) (HCC)    Vitamin B12 deficiency     Patient Active Problem List   Diagnosis Date Noted   TIA (transient ischemic attack) 08/25/2018   Trochanteric bursitis, right hip 07/01/2016   Chronic right-sided low back pain without sciatica 07/01/2016   PAD (peripheral artery disease) (HCC) 08/09/2014   Leg pain 05/23/2014   Atrial fibrillation (HCC) 11/22/2013   Mitral valve disorders(424.0) 11/22/2013   S/P mitral valve repair 11/19/2013   S/P CABG x 3 11/11/2013   Chest pain 11/08/2013   NSTEMI (non-ST elevated myocardial infarction) (HCC) 11/08/2013   Iron deficiency anemia 11/08/2013   Acute heart failure (HCC) 11/08/2013   Cerumen impaction 09/30/2013   B12 DEFICIENCY 06/21/2010   Osteoporosis 03/16/2010   BACK PAIN 01/02/2010   PVD 04/14/2009  ARTERIOVENOUS MALFORMATION, COLON 04/14/2009   CHANGE IN BOWELS 04/14/2009   DM (diabetes mellitus) type II uncontrolled, periph vascular disorder (HCC) 03/13/2009   ANXIETY STATE, UNSPECIFIED 03/13/2009   DYSURIA 04/01/2008   Hyperlipidemia 05/18/2007   Essential hypertension 02/02/2007   ARTIFICIAL MENOPAUSE 02/02/2007    Past Surgical History:  Procedure Laterality Date   ABDOMINAL HYSTERECTOMY     1969   APPENDECTOMY     1945   CORONARY ARTERY BYPASS GRAFT  1990, 1995, 1998   HEMORRHOID SURGERY     1959   LEFT AND RIGHT HEART CATHETERIZATION WITH CORONARY  ANGIOGRAM N/A 11/10/2013   Procedure: LEFT AND RIGHT HEART CATHETERIZATION WITH CORONARY ANGIOGRAM;  Surgeon: Lennette Bihari, MD;  Location: [facility] CATH LAB;  Service: Cardiovascular;  Laterality: N/A;   MITRAL VALVE REPLACEMENT (MVR)/CORONARY ARTERY BYPASS GRAFTING (CABG) N/A 11/11/2013   Procedure: MITRAL VALVE Ring repair (MVR)/CORONARY ARTERY BYPASS GRAFTING (CABG) times three using left internal mammary and left sphenous vein.;  Surgeon: Loreli Slot, MD;  Location: [facility] OR;  Service: Open Heart Surgery;  Laterality: N/A;  Coronary artery bypass graft times three using left internal mammary artery and left saphenous leg vein using endoscope.  Exploration of right leg.      OB History   No obstetric history on file.      Home Medications    Prior to Admission medications   Medication Sig Start Date End Date Taking? Authorizing Provider  acetaminophen (TYLENOL) 500 MG tablet Take 500 mg by mouth daily as needed for moderate pain.    [provider]  Aspirin (ECOTRIN LOW STRENGTH PO) Take 81 mg by mouth at bedtime.     [provider]  Calcium Carbonate-Vitamin D (CALCIUM-VITAMIN D) 600-200 MG-UNIT CAPS Take 1 capsule by mouth 2 (two) times daily.     [provider]  Casanthranol-Docusate Sodium 30-100 MG CAPS Take 1 capsule by mouth daily as needed (for constipation).     [provider]  doxycycline (VIBRA-TABS) 100 MG tablet Take 1 tablet (100 mg total) by mouth 2 (two) times daily. 08/25/18   Seabron Spates R, DO  ferrous sulfate 325 (65 FE) MG tablet Take 325 mg by mouth 2 (two) times daily with a meal.     [provider]  lisinopril (PRINIVIL,ZESTRIL) 40 MG tablet TAKE 1 TABLET BY MOUTH ONCE DAILY 08/25/17   Iran Ouch, MD  metoprolol tartrate (LOPRESSOR) 50 MG tablet TAKE 1 TABLET BY MOUTH TWICE DAILY 10/13/17   Donato Schultz, DO  Multiple Vitamin (MULTIVITAMIN) tablet Take 1 tablet by mouth daily.      [provider]  pravastatin (PRAVACHOL) 40 MG tablet Take 1 tablet (40 mg total) by mouth daily. 04/15/18   Donato Schultz, DO  sitaGLIPtin (JANUVIA) 100 MG tablet Take 1 tablet (100 mg total) by mouth daily. 04/28/18   Donato Schultz, DO  Tetrahydrozoline HCl (VISINE OP) Apply 1 drop to eye daily as needed (dry eyes).    [provider]  vitamin B-12 (CYANOCOBALAMIN) 1000 MCG tablet Take 1,000 mcg by mouth daily.      [provider]    Family History Family History  Problem Relation Age of Onset   Cervical cancer Mother    Cancer Brother        renal ...x3??   Hypertension Other    Stroke Other    Hypertension Sister    Heart attack Neg Hx  Social History Social History   Tobacco Use   Smoking status: Former Smoker    Packs/day: 1.00    Years: 36.00    Pack years: 36.00    Types: Cigarettes    Last attempt to quit: 02/18/1984    Years since quitting: 34.5   Smokeless tobacco: Never Used  Substance Use Topics   Alcohol use: No   Drug use: No     Allergies   Meperidine hcl; Sulfonamide derivatives; and Demerol [meperidine]   Review of Systems Review of Systems  Constitutional: Negative for fever.  Respiratory: Negative for shortness of breath.   Cardiovascular: Positive for leg swelling. Negative for chest pain.  Gastrointestinal: Negative for abdominal pain.  Psychiatric/Behavioral: Positive for confusion.  All other systems reviewed and are negative.    Physical Exam Updated Vital Signs Pulse 68    Temp 98.4 F (36.9 C) (Oral)    Resp 18    SpO2 98%   Physical Exam Vitals signs and nursing note reviewed.  Constitutional:      General: She is not in acute distress.    Appearance: Normal appearance. She is well-developed. She is not ill-appearing.     Comments: Calm and cooperative. Mildly confused.  HENT:     Head: Normocephalic and atraumatic.  Eyes:     General: No scleral icterus.       Right eye: No  discharge.        Left eye: No discharge.     Conjunctiva/sclera: Conjunctivae normal.     Pupils: Pupils are equal, round, and reactive to light.  Neck:     Musculoskeletal: Normal range of motion.  Cardiovascular:     Rate and Rhythm: Normal rate and regular rhythm.  Pulmonary:     Effort: Pulmonary effort is normal. No respiratory distress.  Abdominal:     General: There is no distension.     Palpations: Abdomen is soft.     Tenderness: There is no abdominal tenderness.  Musculoskeletal:     Right lower leg: No edema.     Left lower leg: Edema (Significant edema of the LLE with associated redness extending up the leg. Two wounds over the posterior calf) present.  Skin:    General: Skin is warm and dry.     Findings: Erythema present.  Neurological:     Mental Status: She is alert and oriented to person, place, and time.     Comments: Mild confusion - pt is able to state name, DOB, date, time, where she is, president. She is slow to respond and cannot give me details regarding her history. GCS 15. Speaks in a clear voice. Cranial nerves II through XII grossly intact. 5/5 strength in all extremities. Sensation fully intact.  Bilateral finger-to-nose intact.    Psychiatric:        Mood and Affect: Mood normal.        Behavior: Behavior normal.          ED Treatments / Results  Labs (all labs ordered are listed, but only abnormal results are displayed) Labs Reviewed  BASIC METABOLIC PANEL - Abnormal; Notable for the following components:      Result Value   CO2 21 (*)    Glucose, Bld 132 (*)    Creatinine, Ser 1.03 (*)    GFR calc non Af Amer 49 (*)    GFR calc Af Amer 57 (*)    All other components within normal limits  CBC WITH DIFFERENTIAL/PLATELET - Abnormal; Notable  for the following components:   RBC 2.86 (*)    Hemoglobin 9.9 (*)    HCT 29.2 (*)    MCV 102.1 (*)    MCH 34.6 (*)    Monocytes Absolute 1.1 (*)    All other components within normal limits    URINALYSIS, ROUTINE W REFLEX MICROSCOPIC - Abnormal; Notable for the following components:   Protein, ur 30 (*)    Leukocytes,Ua TRACE (*)    All other components within normal limits  HEMOGLOBIN A1C - Abnormal; Notable for the following components:   Hgb A1c MFr Bld 5.7 (*)    All other components within normal limits  URINE CULTURE  TSH  BASIC METABOLIC PANEL  CBC    EKG None  Radiology Vas [place] Lower Extremity Venous (dvt) (only [facility])  Result Date: 08/29/2018  Lower Venous Study Indications: Edema, and Multiple tick bites.  Comparison Study: No prior study on file for comparison Performing Technologist: Sherren Kerns RVS  Examination Guidelines: A complete evaluation includes B-mode imaging, spectral Doppler, color Doppler, and power Doppler as needed of all accessible portions of each vessel. Bilateral testing is considered an integral part of a complete examination. Limited examinations for reoccurring indications may be performed as noted.  Right Venous Findings: +---+---------------+---------+-----------+----------+-------+      Compressibility Phasicity Spontaneity Properties Summary  +---+---------------+---------+-----------+----------+-------+  CFV Full            Yes       Yes                             +---+---------------+---------+-----------+----------+-------+  Left Venous Findings: +---------+---------------+---------+-----------+----------+-------+            Compressibility Phasicity Spontaneity Properties Summary  +---------+---------------+---------+-----------+----------+-------+  CFV       Full            Yes       Yes                             +---------+---------------+---------+-----------+----------+-------+  SFJ       Full                                                      +---------+---------------+---------+-----------+----------+-------+  FV Prox   Full                                                       +---------+---------------+---------+-----------+----------+-------+  FV Mid    Full                                                      +---------+---------------+---------+-----------+----------+-------+  FV Distal Full                                                      +---------+---------------+---------+-----------+----------+-------+  PFV       Full                                                      +---------+---------------+---------+-----------+----------+-------+  POP       Full            Yes       Yes                             +---------+---------------+---------+-----------+----------+-------+  PTV       Full                                                      +---------+---------------+---------+-----------+----------+-------+  PERO      Full                                                      +---------+---------------+---------+-----------+----------+-------+  GSV       Full                                                      +---------+---------------+---------+-----------+----------+-------+    Summary: Right: No evidence of common femoral vein obstruction. Left: No evidence of common femoral vein obstruction. Ultrasound characteristics of enlarged lymph nodes noted in the groin.  *See table(s) above for measurements and observations.    Preliminary     Procedures Procedures (including critical care time)  Medications Ordered in ED Medications  aspirin EC tablet 81 mg (has no administration in time range)  doxycycline (VIBRA-TABS) tablet 100 mg (has no administration in time range)  lisinopril (PRINIVIL,ZESTRIL) tablet 40 mg (has no administration in time range)  metoprolol tartrate (LOPRESSOR) tablet 50 mg (has no administration in time range)  pravastatin (PRAVACHOL) tablet 40 mg (has no administration in time range)  senna-docusate (Senokot-S) tablet 1 tablet (has no administration in time range)  ferrous sulfate tablet 325 mg (has no administration in time range)    vitamin B-12 (CYANOCOBALAMIN) tablet 1,000 mcg (has no administration in time range)  calcium-vitamin D (OSCAL WITH D) 500-200 MG-UNIT per tablet (has no administration in time range)  multivitamin with minerals tablet 1 tablet (has no administration in time range)  tetrahydrozoline 0.05 % ophthalmic solution 1 drop (has no administration in time range)  amoxicillin (AMOXIL) capsule 500 mg (has no administration in time range)  enoxaparin (LOVENOX) injection 40 mg (has no administration in time range)  sodium chloride flush (NS) 0.9 % injection 3 mL (has no administration in time range)  sodium chloride flush (NS) 0.9 % injection 3 mL (has no administration in time range)  0.9 %  sodium chloride infusion (has no administration in time range)  acetaminophen (TYLENOL) tablet 650 mg (has no administration in time range)    Or  acetaminophen (TYLENOL) suppository 650 mg (has  no administration in time range)  oxyCODONE (Oxy IR/ROXICODONE) immediate release tablet 5 mg (has no administration in time range)  polyethylene glycol (MIRALAX / GLYCOLAX) packet 17 g (has no administration in time range)  ondansetron (ZOFRAN) tablet 4 mg (has no administration in time range)    Or  ondansetron (ZOFRAN) injection 4 mg (has no administration in time range)  insulin aspart (novoLOG) injection 0-9 Units (has no administration in time range)     Initial Impression / Assessment and Plan / ED Course  I have reviewed the triage vital signs and the nursing notes.  Pertinent labs & imaging results that were available during my care of the patient were reviewed by me and considered in my medical decision making (see chart for details).  85 year old female presents with worsening confusion and L leg swelling. She is hypertensive but otherwise vitals are normal. She does have marked pitting edema and erythema of the LLE. DVT study was obtained which was negative for DVT. Records were reviewed from recent ED work  up. She has a negative CT and MRI of brain on 3/31. She also had normal B.burgdorfi antibodies and RMSF labs. Her urine appears consistent with a possible UTI and urine culture grew out aerococcus urinae which is a rare bacteria. Discussed with pharmacy who recommend covering with Amoxicillin  q8 for 5 days. They also recommend having her continue her Doxy. Her repeat labs today appear improved however due to her worsening mental status and leg swelling, will admit for further management. Shared visit with Dr. Adela Lank. Her son, [name] was updated. Discussed with Dr. Willette Pa who will admit.  Final Clinical Impressions(s) / ED Diagnoses   Final diagnoses:  Confusion  Acute cystitis without hematuria    ED Discharge Orders    None       Bethel Born, PA-C 08/29/18 1832    Melene Plan, DO 08/29/18 2018

## 2018-08-29 NOTE — H&P (Signed)
History and Physical    Emily Daniel:[id number] DOB: [birth year] DOA: 08/29/2018  PCP: Donato Schultz, DO   Patient coming from: home  I have personally briefly reviewed patient's old medical records in [facility]  Chief Complaint: confusion  HPI: Emily Daniel is a 85 y.o. female with medical history significant of anxiety, CAD status post CABG, peripheral artery disease, CHF, diabetes, intermittent atrial fibrillation not on anticoagulation, AVMs,hyperlipidemia,and prior tobacco abuse and multiple areas of old small frontal infarctions on MRI. She was seen in the ED. Abx since Tuesday Looks worse, confusion. She denies dysuria or frequency but reports urgency.   ED Course: U C&S from 4/1 growing aeromonas urinae and cellulitis of left lower extremities.  Remains confused  Review of Systems: As per HPI otherwise all other systems reviewed and  negative.   Past Medical History:  Diagnosis Date   Anxiety    Atrial fibrillation (HCC)    post op >>> req'd Amio and Coumadin   AVM (arteriovenous malformation)    CAD (coronary artery disease)    a. NSTEMI 10/2013 >>> LHC (10/2013):  Distal left main 90%, proximal LAD 30%, mid LAD 20%, ostial circumflex 99%, then 80%, mid RCA 99%, EF 30-35%, 3-4+ MR; proximal right iliac occluded, right renal 50%, ostial left iliac 70% >>> CABG/MV repair   Cataract    CHF (congestive heart failure) (HCC)    Dr. Dorris Fetch following   Chronic systolic heart failure (HCC)    Diabetes mellitus, type 2 (HCC)    Hemorrhoids    Hx of echocardiogram    Echo (9/15):  Mild LVH, EF 40-45%, inf AK, Gr 1 DD, trivial AI, MV repair ok (mild MS, mild MR), mild LAE, PASP 32 mmHg.   Hyperlipidemia    Hypertension    Ischemic cardiomyopathy    a. Echo (11/08/13):  EF 30-35%, diffuse hypokinesis, severe MR, mild LAE, PASP 48 mm Hg   Mitral regurgitation    a. s/p MV repair at time of CABG   Osteoporosis    PVD (peripheral vascular  disease) (HCC)    Vitamin B12 deficiency     Past Surgical History:  Procedure Laterality Date   ABDOMINAL HYSTERECTOMY     1969   APPENDECTOMY     1945   CORONARY ARTERY BYPASS GRAFT  1990, 1995, 1998   HEMORRHOID SURGERY     1959   LEFT AND RIGHT HEART CATHETERIZATION WITH CORONARY ANGIOGRAM N/A 11/10/2013   Procedure: LEFT AND RIGHT HEART CATHETERIZATION WITH CORONARY ANGIOGRAM;  Surgeon: Lennette Bihari, MD;  Location: [facility] CATH LAB;  Service: Cardiovascular;  Laterality: N/A;   MITRAL VALVE REPLACEMENT (MVR)/CORONARY ARTERY BYPASS GRAFTING (CABG) N/A 11/11/2013   Procedure: MITRAL VALVE Ring repair (MVR)/CORONARY ARTERY BYPASS GRAFTING (CABG) times three using left internal mammary and left sphenous vein.;  Surgeon: Loreli Slot, MD;  Location: [facility] OR;  Service: Open Heart Surgery;  Laterality: N/A;  Coronary artery bypass graft times three using left internal mammary artery and left saphenous leg vein using endoscope.  Exploration of right leg.     Social History   Social History Narrative   Exercise-- yard, house work     reports that she quit smoking about 34 years ago. Her smoking use included cigarettes. She has a 36.00 pack-year smoking history. She has never used smokeless tobacco. She reports that she does not drink alcohol or use drugs.  Allergies  Allergen Reactions   Meperidine Hcl Nausea And Vomiting  Sulfonamide Derivatives Nausea And Vomiting   Demerol [Meperidine]     Unknown per pt     Family History  Problem Relation Age of Onset   Cervical cancer Mother    Cancer Brother        renal ...x3??   Hypertension Other    Stroke Other    Hypertension Sister    Heart attack Neg Hx      Prior to Admission medications   Medication Sig Start Date End Date Taking? Authorizing Provider  Aspirin (ECOTRIN LOW STRENGTH PO) Take 81 mg by mouth at bedtime.    Yes [provider]  Calcium Carbonate-Vitamin D (CALCIUM-VITAMIN D)  600-200 MG-UNIT CAPS Take 1 capsule by mouth 2 (two) times daily.    Yes [provider]  Casanthranol-Docusate Sodium 30-100 MG CAPS Take 1 capsule by mouth daily as needed (for constipation).    Yes [provider]  doxycycline (VIBRA-TABS) 100 MG tablet Take 1 tablet (100 mg total) by mouth 2 (two) times daily. 08/25/18  Yes Seabron SpatesLowne Chase, Yvonne R, DO  ferrous sulfate 325 (65 FE) MG tablet Take 325 mg by mouth 2 (two) times daily with a meal.    Yes [provider]  lisinopril (PRINIVIL,ZESTRIL) 40 MG tablet TAKE 1 TABLET BY MOUTH ONCE DAILY Patient taking differently: Take 40 mg by mouth daily.  08/25/17  Yes [place] OuchArida, Muhammad A, MD  metoprolol tartrate (LOPRESSOR) 50 MG tablet TAKE 1 TABLET BY MOUTH TWICE DAILY Patient taking differently: Take 50 mg by mouth 2 (two) times daily.  10/13/17  Yes Donato SchultzLowne Chase, Yvonne R, DO  Multiple Vitamin (MULTIVITAMIN) tablet Take 1 tablet by mouth daily.     Yes [provider]  pravastatin (PRAVACHOL) 40 MG tablet Take 1 tablet (40 mg total) by mouth daily. 04/15/18  Yes Seabron SpatesLowne Chase, Yvonne R, DO  sitaGLIPtin (JANUVIA) 100 MG tablet Take 1 tablet (100 mg total) by mouth daily. 04/28/18  Yes Seabron SpatesLowne Chase, Yvonne R, DO  Tetrahydrozoline HCl (VISINE OP) Apply 1 drop to eye daily as needed (dry eyes).   Yes [provider]  vitamin B-12 (CYANOCOBALAMIN) 1000 MCG tablet Take 1,000 mcg by mouth daily.     Yes [provider]    Physical Exam:  Constitutional: NAD, calm, comfortable Vitals:   08/29/18 1400 08/29/18 1500 08/29/18 1530 08/29/18 1740  BP: (!) 186/70 (!) 177/72 (!) 176/73 (!) 171/73  Pulse: 67 70 67   Resp: 14 16 (!) 23 19  Temp:      TempSrc:      SpO2: 100% 99% 98%   Weight:      Height:       Eyes: PERRL, lids and conjunctivae normal ENMT: Mucous membranes are moist. Posterior pharynx clear of any exudate or lesions.Normal dentition.  Neck: normal, supple, no masses, no  thyromegaly Respiratory: clear to auscultation bilaterally, no wheezing, no crackles. Normal respiratory effort. No accessory muscle use.  Cardiovascular: Regular rate and rhythm, no murmurs / rubs / gallops. No extremity edema. 2+ pedal pulses. No carotid bruits.  Abdomen: no tenderness, no masses palpated. No hepatosplenomegaly. Bowel sounds positive.  Musculoskeletal: no clubbing / cyanosis. No joint deformity upper and lower extremities. Good ROM, no contractures. Normal muscle tone.  Skin: no rashes, lesions, ulcers. No induration Neurologic: CN 2-12 grossly intact. Sensation intact, DTR normal. Strength 5/5 in all 4.  Psychiatric: Normal judgment and insight. Alert and oriented x 3. Normal mood.    Labs on Admission: I have personally  reviewed following labs and imaging studies  CBC: Recent Labs  Lab 08/25/18 1737 08/29/18 1411  WBC 9.5 7.8  NEUTROABS 6.3 4.9  HGB 10.3* 9.9*  HCT 31.5* 29.2*  MCV 106.4* 102.1*  PLT 167 239   Basic Metabolic Panel: Recent Labs  Lab 08/25/18 1737 08/29/18 1411  NA 137 141  K 3.9 3.7  CL 104 109  CO2 22 21*  GLUCOSE 145* 132*  BUN 30* 18  CREATININE 1.65* 1.03*  CALCIUM 9.0 9.5   GFR: Estimated Creatinine Clearance: 31.1 mL/min (A) (by C-G formula based on SCr of 1.03 mg/dL (H)). Liver Function Tests: Recent Labs  Lab 08/25/18 1737  AST 29  ALT 23  ALKPHOS 40  BILITOT 0.9  PROT 7.3  ALBUMIN 3.9   Coagulation Profile: Recent Labs  Lab 08/25/18 1737  INR 1.2   CBG: Recent Labs  Lab 08/25/18 1721  GLUCAP 133*   Urine analysis:    Component Value Date/Time   COLORURINE YELLOW 08/29/2018 1530   APPEARANCEUR CLEAR 08/29/2018 1530   LABSPEC 1.018 08/29/2018 1530   PHURINE 5.0 08/29/2018 1530   GLUCOSEU NEGATIVE 08/29/2018 1530   HGBUR NEGATIVE 08/29/2018 1530   HGBUR negative 03/16/2010 0818   BILIRUBINUR NEGATIVE 08/29/2018 1530   BILIRUBINUR neg 04/03/2017 1229   KETONESUR NEGATIVE 08/29/2018 1530   PROTEINUR  30 (A) 08/29/2018 1530   UROBILINOGEN 0.2 04/03/2017 1229   UROBILINOGEN 0.2 11/10/2013 2340   NITRITE NEGATIVE 08/29/2018 1530   LEUKOCYTESUR TRACE (A) 08/29/2018 1530    Radiological Exams on Admission: Vas [place] Lower Extremity Venous (dvt) (only [facility])  Result Date: 08/29/2018  Lower Venous Study Indications: Edema, and Multiple tick bites.  Comparison Study: No prior study on file for comparison Performing Technologist: Sherren Kerns RVS  Examination Guidelines: A complete evaluation includes B-mode imaging, spectral Doppler, color Doppler, and power Doppler as needed of all accessible portions of each vessel. Bilateral testing is considered an integral part of a complete examination. Limited examinations for reoccurring indications may be performed as noted.  Right Venous Findings: +---+---------------+---------+-----------+----------+-------+      Compressibility Phasicity Spontaneity Properties Summary  +---+---------------+---------+-----------+----------+-------+  CFV Full            Yes       Yes                             +---+---------------+---------+-----------+----------+-------+  Left Venous Findings: +---------+---------------+---------+-----------+----------+-------+            Compressibility Phasicity Spontaneity Properties Summary  +---------+---------------+---------+-----------+----------+-------+  CFV       Full            Yes       Yes                             +---------+---------------+---------+-----------+----------+-------+  SFJ       Full                                                      +---------+---------------+---------+-----------+----------+-------+  FV Prox   Full                                                      +---------+---------------+---------+-----------+----------+-------+  FV Mid    Full                                                      +---------+---------------+---------+-----------+----------+-------+  FV Distal Full                                                       +---------+---------------+---------+-----------+----------+-------+  PFV       Full                                                      +---------+---------------+---------+-----------+----------+-------+  POP       Full            Yes       Yes                             +---------+---------------+---------+-----------+----------+-------+  PTV       Full                                                      +---------+---------------+---------+-----------+----------+-------+  PERO      Full                                                      +---------+---------------+---------+-----------+----------+-------+  GSV       Full                                                      +---------+---------------+---------+-----------+----------+-------+    Summary: Right: No evidence of common femoral vein obstruction. Left: No evidence of common femoral vein obstruction. Ultrasound characteristics of enlarged lymph nodes noted in the groin.  *See table(s) above for measurements and observations.    Preliminary     EKG: Independently reviewed. Sr at 71 BPM with APC and increased PR no prior available for comparison  Assessment/Plan Principal Problem:   Cellulitis of left leg without foot Active Problems:   Acute lower UTI   Acute metabolic encephalopathy   DM (diabetes mellitus) type II uncontrolled, periph vascular disorder (HCC)   Hyperlipidemia   Anxiety state   Essential hypertension   Atrial fibrillation (HCC)   1.  Cellulitis of left leg without foot: Continue doxycycline to complete 10 days.  You to monitor.  Hopefully will improve with the addition of amoxicillin as below.  2.  Acute lower UTI: Patient with Aerococcus urinae UTI.  A fairly rare organism that is treated with amoxicillin.  Sensitivities are usually not obtained.  But given patient's condition will start treatment.  We will give her amoxicillin 500 mg p.o. every 8 hours for 5 days.  ED practitioner  discussed the case with ID who recommended this treatment.  3.  Acute metabolic encephalopathy: Likely related to combined cellulitis and UTI.  Treat underlying cause and monitor closely.  Please note however patient has significant abnormalities on the MRI of her brain and she may be developing multi-infarct dementia.  Benefit from neuropsych testing after discharge  4.  Diabetes mellitus type 2 uncontrolled with peripheral vascular disorder: Noted patient takes Januvia at home which is not on formulary here.  We will put her on sliding scale insulin coverage and monitor closely if blood glucose become markedly out-of-control will consider adding a different oral agent.  5.  Hyperlipidemia: Continue home medication management.  6.  Anxiety state: Patient currently not on any long-term benzodiazepines.  We will continue management with supportive care.  7.  Essential hypertension: Blood pressures elevated here she is missed some doses of medications in the past few hours.  Will restart home medications.  8.  Atrial fibrillation: Patient has a history of atrial fibrillation but currently is in a sinus rhythm.  Long discussion with the patient's son about why she is not on anticoagulation.  We have no records from her cardiologist best I can infer is that she has been in sinus rhythm long enough that he does not think it is necessary or that she has refused anticoagulation in the past.  I recommended he discuss that with cardiology when she follows up with them as an outpatient.  DVT prophylaxis: Lovenox Code Status: Full code Family Communication: Spoke with patient's son by telephone Edrick Oh at [phone] Disposition Plan: Hopefully home in 24 to 48 hours Consults called: None Admission status: Observation   Lahoma Crocker MD FACP [facility] Pager [phone]  How to contact the [facility] Attending or Consulting provider 7A - 7P or covering provider during after hours 7P  -7A, for this patient?  1. Check the care team in [facility] and look for a) attending/consulting [facility] provider listed and b) the [facility] team listed 2. Log into [email or web address] and use [place]'s universal password to access. If you do not have the password, please contact the hospital operator. 3. Locate the [facility] provider you are looking for under [facility] and page to a number that you can be directly reached. 4. If you still have difficulty reaching the provider, please page the [facility] (Director on Call) for the Hospitalists listed on amion for assistance.  If 7PM-7AM, please contact night-coverage [email or web address] Password [facility]  08/29/2018, 6:08 PM

## 2018-08-29 NOTE — ED Notes (Signed)
Assisted pt to use room phone to call son who she was able to reach.

## 2018-08-29 NOTE — Progress Notes (Signed)
Pt's son, Xuri Purdom, cell: [phone]--aware of no visitor policy.

## 2018-08-29 NOTE — ED Notes (Signed)
Pt given [place] sandwich and sprite; per Dr. Adela Lank.

## 2018-08-29 NOTE — Progress Notes (Signed)
Patient's BP is elevated 170/54. MD was notified. Per MD verbal order patient will receive Clonidine 0.2 mg po once. Will continue to monitor.

## 2018-08-29 NOTE — ED Notes (Signed)
ED Provider at bedside. 

## 2018-08-29 NOTE — Progress Notes (Signed)
VASCULAR LAB PRELIMINARY  PRELIMINARY  PRELIMINARY  PRELIMINARY  Left lower extremity venous duplex completed.    Preliminary report:  See CV proc for preliminary for results.  Gave results to ED provider  Imoni Kohen, Sonny Masters, RVT 08/29/2018, 2:51 PM

## 2018-08-29 NOTE — ED Notes (Signed)
ED TO INPATIENT HANDOFF REPORT  ED Nurse Name and Phone #: Osborne Casco, RN   S Name/Age/Gender Emily Daniel 85 y.o. female Room/Bed: 021C/021C  Code Status   Code Status: Full Code  Home/SNF/Other Home Patient oriented to: self and situation Is this baseline? Yes   Triage Complete: Triage complete  Chief Complaint confusion/leg swelling  Triage Note Pt brought in by son and DIL.  Since seen 3-31 pt has had increased confusion and increased leg swelling from tick bites, was started on doxycycline.  Son and DIL report that pt refused to be admitted but wants her admitted today.  Pt lives alone and has family checking on pt.     Allergies Allergies  Allergen Reactions  . Meperidine Hcl Nausea And Vomiting  . Sulfonamide Derivatives Nausea And Vomiting  . Demerol [Meperidine]     Unknown per pt     Level of Care/Admitting Diagnosis ED Disposition    ED Disposition Condition Comment   Admit  Hospital Area: [facility] [[id number]]  Level of Care: Med-Surg [16]  I expect the patient will be discharged within 24 hours: Yes  LOW acuity---Tx typically complete <24 hrs---ACUTE conditions typically can be evaluated <24 hours---LABS likely to return to acceptable levels <24 hours---IS near functional baseline---EXPECTED to return to current living arrangement---NOT newly hypoxic: Meets criteria for 5C-Observation unit  Diagnosis: Cellulitis of left leg without foot [384536]  Admitting Physician: Lahoma Crocker [[id number]]  Attending Physician: Lahoma Crocker [[id number]]  PT Class (Do Not Modify): Observation [104]  PT Acc Code (Do Not Modify): Observation [10022]       B Medical/Surgery History Past Medical History:  Diagnosis Date  . Anxiety   . Atrial fibrillation (HCC)    post op >>> req'd Amio and Coumadin  . AVM (arteriovenous malformation)   . CAD (coronary artery disease)    a. NSTEMI 10/2013 >>> LHC (10/2013):  Distal left main 90%, proximal LAD  30%, mid LAD 20%, ostial circumflex 99%, then 80%, mid RCA 99%, EF 30-35%, 3-4+ MR; proximal right iliac occluded, right renal 50%, ostial left iliac 70% >>> CABG/MV repair  . Cataract   . CHF (congestive heart failure) (HCC)    Dr. Dorris Fetch following  . Chronic systolic heart failure (HCC)   . Diabetes mellitus, type 2 (HCC)   . Hemorrhoids   . Hx of echocardiogram    Echo (9/15):  Mild LVH, EF 40-45%, inf AK, Gr 1 DD, trivial AI, MV repair ok (mild MS, mild MR), mild LAE, PASP 32 mmHg.  [name] Hyperlipidemia   . Hypertension   . Ischemic cardiomyopathy    a. Echo (11/08/13):  EF 30-35%, diffuse hypokinesis, severe MR, mild LAE, PASP 48 mm Hg  . Mitral regurgitation    a. s/p MV repair at time of CABG  . Osteoporosis   . PVD (peripheral vascular disease) (HCC)   . Vitamin B12 deficiency    Past Surgical History:  Procedure Laterality Date  . ABDOMINAL HYSTERECTOMY     1969  . APPENDECTOMY     1945  . CORONARY ARTERY BYPASS GRAFT  1990, 1995, 1998  . HEMORRHOID SURGERY     1959  . LEFT AND RIGHT HEART CATHETERIZATION WITH CORONARY ANGIOGRAM N/A 11/10/2013   Procedure: LEFT AND RIGHT HEART CATHETERIZATION WITH CORONARY ANGIOGRAM;  Surgeon: Lennette Bihari, MD;  Location: [facility] CATH LAB;  Service: Cardiovascular;  Laterality: N/A;  . MITRAL VALVE REPLACEMENT (MVR)/CORONARY ARTERY BYPASS GRAFTING (CABG) N/A 11/11/2013  Procedure: MITRAL VALVE Ring repair (MVR)/CORONARY ARTERY BYPASS GRAFTING (CABG) times three using left internal mammary and left sphenous vein.;  Surgeon: Loreli SlotSteven C Hendrickson, MD;  [facility];  Service: Open Heart Surgery;  Laterality: N/A;  Coronary artery bypass graft times three using left internal mammary artery and left saphenous leg vein using endoscope.  Exploration of right leg.      A IV Location/Drains/Wounds Patient Lines/Drains/Airways Status   Active Line/Drains/Airways    Name:   Placement date:   Placement time:   Site:   Days:   Peripheral IV 08/29/18  Left Antecubital   08/29/18    1515    Antecubital   less than 1   Incision (Closed) 11/11/13 Chest Other (Comment)   11/11/13    1248     1752   Incision (Closed) 11/11/13 Leg Left   11/11/13    1729     1752   Incision (Closed) 11/11/13 Leg Right   11/11/13    1930     1752          Intake/Output Last 24 hours No intake or output data in the 24 hours ending 08/29/18 1749  Labs/Imaging Results for orders placed or performed during the hospital encounter of 08/29/18 (from the past 48 hour(s))  Basic metabolic panel     Status: Abnormal   Collection Time: 08/29/18  2:11 PM  Result Value Ref Range   Sodium 141 135 - 145 mmol/L   Potassium 3.7 3.5 - 5.1 mmol/L   Chloride 109 98 - 111 mmol/L   CO2 21 (L) 22 - 32 mmol/L   Glucose, Bld 132 (H) 70 - 99 mg/dL   BUN 18 8 - 23 mg/dL   Creatinine, Ser 2.131.03 (H) 0.44 - 1.00 mg/dL   Calcium 9.5 8.9 - 08.610.3 mg/dL   GFR calc non Af Amer 49 (L) >60 mL/min   GFR calc Af Amer 57 (L) >60 mL/min   Anion gap 11 5 - 15    Comment: Performed at [facility], [address] [place], [place] [address]  CBC with Differential     Status: Abnormal   Collection Time: 08/29/18  2:11 PM  Result Value Ref Range   WBC 7.8 4.0 - 10.5 K/uL   RBC 2.86 (L) 3.87 - 5.11 MIL/uL   Hemoglobin 9.9 (L) 12.0 - 15.0 g/dL   HCT 96.229.2 (L) 95.236.0 - 84.146.0 %   MCV 102.1 (H) 80.0 - 100.0 fL   MCH 34.6 (H) 26.0 - 34.0 pg   MCHC 33.9 30.0 - 36.0 g/dL   RDW 32.411.9 40.111.5 - 02.715.5 %   Platelets 239 150 - 400 K/uL   nRBC 0.0 0.0 - 0.2 %   Neutrophils Relative % 62 %   Neutro Abs 4.9 1.7 - 7.7 K/uL   Lymphocytes Relative 22 %   Lymphs Abs 1.7 0.7 - 4.0 K/uL   Monocytes Relative 14 %   Monocytes Absolute 1.1 (H) 0.1 - 1.0 K/uL   Eosinophils Relative 1 %   Eosinophils Absolute 0.0 0.0 - 0.5 K/uL   Basophils Relative 0 %   Basophils Absolute 0.0 0.0 - 0.1 K/uL   Immature Granulocytes 1 %   Abs Immature Granulocytes 0.06 0.00 - 0.07 K/uL    Comment: Performed at [facility], [address] [place], [place] [address]  Urinalysis, Routine w reflex microscopic     Status: Abnormal   Collection Time: 08/29/18  3:30 PM  Result  Value Ref Range   Color, Urine YELLOW YELLOW   APPearance CLEAR CLEAR   Specific Gravity, Urine 1.018 1.005 - 1.030   pH 5.0 5.0 - 8.0   Glucose, UA NEGATIVE NEGATIVE mg/dL   Hgb urine dipstick NEGATIVE NEGATIVE   Bilirubin Urine NEGATIVE NEGATIVE   Ketones, ur NEGATIVE NEGATIVE mg/dL   Protein, ur 30 (A) NEGATIVE mg/dL   Nitrite NEGATIVE NEGATIVE   Leukocytes,Ua TRACE (A) NEGATIVE   RBC / HPF 0-5 0 - 5 RBC/hpf   WBC, UA 0-5 0 - 5 WBC/hpf   Bacteria, UA NONE SEEN NONE SEEN   Squamous Epithelial / LPF 0-5 0 - 5    Comment: Performed at [facility], [address], [place], [place] [address]   Vas [place] Lower Extremity Venous (dvt) (only [facility] & [facility])  Result Date: 08/29/2018  Lower Venous Study Indications: Edema, and Multiple tick bites.  Comparison Study: No prior study on file for comparison Performing Technologist: Sherren Kerns RVS  Examination Guidelines: A complete evaluation includes B-mode imaging, spectral Doppler, color Doppler, and power Doppler as needed of all accessible portions of each vessel. Bilateral testing is considered an integral part of a complete examination. Limited examinations for reoccurring indications may be performed as noted.  Right Venous Findings: +---+---------------+---------+-----------+----------+-------+    CompressibilityPhasicitySpontaneityPropertiesSummary +---+---------------+---------+-----------+----------+-------+ CFVFull           Yes      Yes                          +---+---------------+---------+-----------+----------+-------+  Left Venous Findings: +---------+---------------+---------+-----------+----------+-------+          CompressibilityPhasicitySpontaneityPropertiesSummary +---------+---------------+---------+-----------+----------+-------+ CFV      Full            Yes      Yes                          +---------+---------------+---------+-----------+----------+-------+ SFJ      Full                                                 +---------+---------------+---------+-----------+----------+-------+ FV Prox  Full                                                 +---------+---------------+---------+-----------+----------+-------+ FV Mid   Full                                                 +---------+---------------+---------+-----------+----------+-------+ FV DistalFull                                                 +---------+---------------+---------+-----------+----------+-------+ PFV      Full                                                 +---------+---------------+---------+-----------+----------+-------+  POP      Full           Yes      Yes                          +---------+---------------+---------+-----------+----------+-------+ PTV      Full                                                 +---------+---------------+---------+-----------+----------+-------+ PERO     Full                                                 +---------+---------------+---------+-----------+----------+-------+ GSV      Full                                                 +---------+---------------+---------+-----------+----------+-------+    Summary: Right: No evidence of common femoral vein obstruction. Left: No evidence of common femoral vein obstruction. Ultrasound characteristics of enlarged lymph nodes noted in the groin.  *See table(s) above for measurements and observations.    Preliminary     Pending Labs Unresulted Labs (From admission, onward)    Start     Ordered   09/05/18 0500  Creatinine, serum  (enoxaparin (LOVENOX)    CrCl >/= 30 ml/min)  Weekly,   R    Comments:  while on enoxaparin therapy    08/29/18 1736   08/30/18 0500  Basic metabolic panel  Tomorrow morning,   R     08/29/18 1736    08/30/18 0500  CBC  Tomorrow morning,   R     08/29/18 1736   08/29/18 1734  TSH  Once,   R     08/29/18 1736   08/29/18 1734  Hemoglobin A1c  Once,   R     08/29/18 1736   08/29/18 1731  CBC  (enoxaparin (LOVENOX)    CrCl >/= 30 ml/min)  Once,   R    Comments:  Baseline for enoxaparin therapy IF NOT ALREADY DRAWN.  Notify MD if PLT < 100 K.    08/29/18 1736   08/29/18 1731  Creatinine, serum  (enoxaparin (LOVENOX)    CrCl >/= 30 ml/min)  Once,   R    Comments:  Baseline for enoxaparin therapy IF NOT ALREADY DRAWN.    08/29/18 1736   08/29/18 1422  Urine culture  ONCE - STAT,   STAT     08/29/18 1422          Vitals/Pain Today's Vitals   08/29/18 1400 08/29/18 1500 08/29/18 1530 08/29/18 1740  BP: (!) 186/70 (!) 177/72 (!) 176/73 (!) 171/73  Pulse: 67 70 67   Resp: 14 16 (!) 23 19  Temp:      TempSrc:      SpO2: 100% 99% 98%   Weight:      Height:      PainSc:        Isolation Precautions No active isolations  Medications Medications  aspirin EC tablet 81 mg (has no administration in time range)  doxycycline (VIBRA-TABS) tablet 100 mg (has no administration in time range)  lisinopril (PRINIVIL,ZESTRIL) tablet 40 mg (has no administration in time range)  metoprolol tartrate (LOPRESSOR) tablet 50 mg (has no administration in time range)  pravastatin (PRAVACHOL) tablet 40 mg (has no administration in time range)  senna-docusate (Senokot-S) tablet 1 tablet (has no administration in time range)  ferrous sulfate tablet 325 mg (has no administration in time range)  vitamin B-12 (CYANOCOBALAMIN) tablet 1,000 mcg (has no administration in time range)  Calcium-Vitamin D 600-200 MG-UNIT CAPS 1 capsule (has no administration in time range)  multivitamin tablet 1 tablet (has no administration in time range)  tetrahydrozoline 0.05 % ophthalmic solution 1 drop (has no administration in time range)  amoxicillin (AMOXIL) capsule 500 mg (has no administration in time range)   enoxaparin (LOVENOX) injection 40 mg (has no administration in time range)  sodium chloride flush (NS) 0.9 % injection 3 mL (has no administration in time range)  sodium chloride flush (NS) 0.9 % injection 3 mL (has no administration in time range)  0.9 %  sodium chloride infusion (has no administration in time range)  acetaminophen (TYLENOL) tablet 650 mg (has no administration in time range)    Or  acetaminophen (TYLENOL) suppository 650 mg (has no administration in time range)  oxyCODONE (Oxy IR/ROXICODONE) immediate release tablet 5 mg (has no administration in time range)  polyethylene glycol (MIRALAX / GLYCOLAX) packet 17 g (has no administration in time range)  ondansetron (ZOFRAN) tablet 4 mg (has no administration in time range)    Or  ondansetron (ZOFRAN) injection 4 mg (has no administration in time range)  insulin aspart (novoLOG) injection 0-9 Units (has no administration in time range)    Mobility walks with person assist High fall risk   Focused Assessments Neuro Assessment Handoff:  Swallow screen pass? Yes  Cardiac Rhythm: Normal sinus rhythm       Neuro Assessment:   Neuro Checks:      Last Documented NIHSS Modified Score:   Has TPA been given? No If patient is a Neuro Trauma and patient is going to OR before floor call report to 4N Charge nurse: [phone] or [phone]     R Recommendations: See Admitting Provider Note  Report given to:   Additional Notes:  Cellulitis of left leg

## 2018-08-29 NOTE — ED Triage Notes (Signed)
Pt brought in by son and DIL.  Since seen 3-31 pt has had increased confusion and increased leg swelling from tick bites, was started on doxycycline.  Son and DIL report that pt refused to be admitted but wants her admitted today.  Pt lives alone and has family checking on pt.

## 2018-08-30 ENCOUNTER — Telehealth: Payer: Self-pay | Admitting: Emergency Medicine

## 2018-08-30 LAB — BASIC METABOLIC PANEL
Anion gap: 9 (ref 5–15)
BUN: 16 mg/dL (ref 8–23)
CO2: 22 mmol/L (ref 22–32)
Calcium: 9.2 mg/dL (ref 8.9–10.3)
Chloride: 109 mmol/L (ref 98–111)
Creatinine, Ser: 1.07 mg/dL — ABNORMAL HIGH (ref 0.44–1.00)
GFR calc Af Amer: 54 mL/min — ABNORMAL LOW (ref >60)
GFR calc non Af Amer: 47 mL/min — ABNORMAL LOW (ref >60)
Glucose, Bld: 147 mg/dL — ABNORMAL HIGH (ref 70–99)
Potassium: 3.9 mmol/L (ref 3.5–5.1)
Sodium: 140 mmol/L (ref 135–145)

## 2018-08-30 LAB — TSH: TSH: 0.843 u[IU]/mL (ref 0.350–4.500)

## 2018-08-30 LAB — GLUCOSE, CAPILLARY
Glucose-Capillary: 131 mg/dL — ABNORMAL HIGH (ref 70–99)
Glucose-Capillary: 118 mg/dL — ABNORMAL HIGH (ref 70–99)
Glucose-Capillary: 130 mg/dL — ABNORMAL HIGH (ref 70–99)
Glucose-Capillary: 105 mg/dL — ABNORMAL HIGH (ref 70–99)

## 2018-08-30 LAB — CBC
HCT: 28.3 % — ABNORMAL LOW (ref 36.0–46.0)
Hemoglobin: 9.2 g/dL — ABNORMAL LOW (ref 12.0–15.0)
MCH: 33.1 pg (ref 26.0–34.0)
MCHC: 32.5 g/dL (ref 30.0–36.0)
MCV: 101.8 fL — ABNORMAL HIGH (ref 80.0–100.0)
Platelets: 219 10*3/uL (ref 150–400)
RBC: 2.78 MIL/uL — ABNORMAL LOW (ref 3.87–5.11)
RDW: 11.9 % (ref 11.5–15.5)
WBC: 7.5 10*3/uL (ref 4.0–10.5)
nRBC: 0 % (ref 0.0–0.2)

## 2018-08-30 MED ORDER — METOPROLOL TARTRATE 50 MG PO TABS: 50.0000 mg | ORAL_TABLET | Freq: Two times a day (BID) | ORAL | 3 refills | Status: DC

## 2018-08-30 MED ORDER — AMOXICILLIN 500 MG PO CAPS: 500.0000 mg | ORAL_CAPSULE | Freq: Three times a day (TID) | ORAL | 0 refills | Status: DC

## 2018-08-30 MED ORDER — ASPIRIN 81 MG PO TBEC: 81.0000 mg | DELAYED_RELEASE_TABLET | Freq: Every day | ORAL | 12 refills | Status: DC

## 2018-08-30 MED ORDER — ENOXAPARIN SODIUM 30 MG/0.3ML ~~LOC~~ SOLN
30.0000 mg | SUBCUTANEOUS | Status: DC
  Administered 2018-08-30: 30 mg via SUBCUTANEOUS
  Filled 2018-08-30: qty 0.3

## 2018-08-30 MED ORDER — DOXYCYCLINE HYCLATE 100 MG PO TABS: 100.0000 mg | ORAL_TABLET | Freq: Two times a day (BID) | ORAL | 0 refills | Status: DC

## 2018-08-30 MED ORDER — ACETAMINOPHEN 325 MG PO TABS: 650.0000 mg | ORAL_TABLET | Freq: Four times a day (QID) | ORAL | 0 refills | Status: AC | PRN

## 2018-08-30 MED ORDER — HYDRALAZINE HCL 20 MG/ML IJ SOLN
10.0000 mg | Freq: Four times a day (QID) | INTRAMUSCULAR | Status: DC | PRN
  Administered 2018-08-31: 10 mg via INTRAVENOUS
  Filled 2018-08-30: qty 1

## 2018-08-30 NOTE — Evaluation (Signed)
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 Pt presented to ED 3/31 with tick bites after doing a telehealth visit (was put on antibiotics) because pt had an episode of garbled speech. Pt was discharged home and returns 4/4 with worsening LLE (redness and swelling), confusion, and UTI.   Clinical Impression  Pt admitted with above diagnosis. Pt currently with functional limitations due to the deficits listed below (see PT Problem List). At the time of PT eval pt was able to perform transfers with up to max assist for balance support and safety. Pt was not able to progress to gait training this session, even with RW for support. Pt indicates that son is available to help her at home. Pt will need 24 hour assistance for safety at home. At this time, do not feel pt will be able to ambulate far enough to safely get into home - pt states son carried her to the car to come to ED. Recommend continued rehab at the SNF level prior to return home, to maximize functional independence and decrease risk for falls. If pt/family decline SNF, would like to see patient again with PT prior to d/c. If performance doesn't improve, will likely need ambulance transfer home, wheelchair, HHPT/HHOT, and 24 hour assistance. Pt will benefit from skilled PT to increase their independence and safety with mobility to allow discharge to the venue listed below.       Follow Up Recommendations SNF;Supervision/Assistance - 24 hour    Equipment Recommendations  3in1 (PT)    Recommendations for Other Services       Precautions / Restrictions Precautions Precautions: Fall Precaution Comments: 4 tick bites  on LLE Restrictions Weight Bearing Restrictions: No      Mobility  Bed Mobility Overal bed mobility: Needs Assistance Bed Mobility: Rolling;Sidelying to Sit;Supine to Sit;Sit to Sidelying Rolling: Min assist Sidelying to sit: Mod assist Supine to sit: (Unable)   Sit to sidelying: Mod assist General bed mobility comments: Heavy use of rails. Pt required assist for LE movement off EOB as well as for LE elevation back up into bed at end of session. Bed pad and assist at trunk for full transition to sit EOB.  Transfers Overall transfer level: Needs assistance Equipment used: Rolling walker (2 wheeled);1 person hand held assist Transfers: Sit to/from [employer] Sit to Stand: Mod assist Stand pivot transfers: Mod assist;Max assist       General transfer comment: Initially with RW. Slow and requires cues for sequencing. Max assist for RUE management to RW. Pt unsafely transitioned to chair with RW taking large step with LLE and unable to bear weight on LLE well enough to let RLE catch up. On transition back to bed, therapist performed mod-max assist stand pivot transfer without walker.   Ambulation/Gait             General Gait Details: Unable to ambulate at this time.   Stairs            Wheelchair Mobility    Modified Rankin (Stroke Patients Only)       Balance Overall balance assessment: Needs assistance Sitting-balance support: Feet supported;Bilateral upper extremity supported Sitting balance-Leahy Scale: Poor   Postural control: Posterior  lean Standing balance support: Bilateral upper extremity supported Standing balance-Leahy Scale: Poor Standing balance comment: Reliant on external support to maintain standing balance. `                             Pertinent Vitals/Pain Pain Assessment: Faces Faces Pain Scale: Hurts little more Pain Location: LLE with attempting to move it in bed.  Pain Descriptors / Indicators:  Discomfort;Grimacing Pain Intervention(s): Limited activity within patient's tolerance;Monitored during session;Repositioned    Home Living Family/patient expects to be discharged to:: Private residence Living Arrangements: Alone;Children(Stats only son living with her) Available Help at Discharge: Family;Available 24 hours/day Type of Home: House Home Access: Stairs to enter   [employer] of Steps: 1 Home Layout: One level Home Equipment: Cane - single point;Walker - 2 wheels      Prior Function Level of Independence: Needs assistance   Gait / Transfers Assistance Needed: Sounds like she has used DME in the past but not now - unclear.  ADL's / Homemaking Assistance Needed: Has been sponge bathing for 3-4 weeks        Hand Dominance   Dominant Hand: Right    Extremity/Trunk Assessment   Upper Extremity Assessment Upper Extremity Assessment: RUE deficits/detail RUE Deficits / Details: Pt with decreased strength and AROM - is not able to explain why she cannot use RUE. Denies pain with PROM and denies it feeling weak, yet when cued to raise RUE up, she cannot complete the task.     Lower Extremity Assessment Lower Extremity Assessment: LLE deficits/detail LLE Deficits / Details: Generalized weakness bilaterally. Pt was limited with LLE AROM and using hands to move LE in bed.  LLE: Unable to fully assess due to pain LLE Coordination: decreased fine motor;decreased gross motor    Cervical / Trunk Assessment Cervical / Trunk Assessment: Kyphotic  Communication   Communication: No difficulties  Cognition Arousal/Alertness: Awake/alert Behavior During Therapy: WFL for tasks assessed/performed Overall Cognitive Status: Impaired/Different from baseline Area of Impairment: Memory;Following commands;Safety/judgement;Awareness;Problem solving                     Memory: Decreased short-term memory Following Commands: Follows one step commands  consistently;Follows one step commands with increased time;Follows multi-step commands inconsistently Safety/Judgement: Decreased awareness of safety;Decreased awareness of deficits Awareness: Intellectual Problem Solving: Slow processing;Decreased initiation;Difficulty sequencing;Requires verbal cues;Requires tactile cues        General Comments      Exercises     Assessment/Plan    PT Assessment Patient needs continued PT services  PT Problem List Decreased strength;Decreased activity tolerance;Decreased range of motion;Decreased balance;Decreased mobility;Decreased knowledge of use of DME;Decreased cognition;Decreased safety awareness;Decreased knowledge of precautions;Pain       PT Treatment Interventions DME instruction;Gait training;Stair training;Functional mobility training;Therapeutic activities;Therapeutic exercise;Neuromuscular re-education;Patient/family education    PT Goals (Current goals can be found in the Care Plan section)  Acute Rehab PT Goals Patient Stated Goal: To return home PT Goal Formulation: With patient Time For Goal Achievement: 09/13/18 Potential to Achieve Goals: Good    Frequency Min 3X/week   Barriers to discharge Decreased caregiver support Pt lives alone - indicates son lives with her but not daughter-in-law. Not sure how accurate this is, or if son could actually be available 24 hours.     Co-evaluation               AM-PAC PT "6 Clicks" Mobility  Outcome Measure Help needed turning  from your back to your side while in a flat bed without using bedrails?: A Little Help needed moving from lying on your back to sitting on the side of a flat bed without using bedrails?: A Little Help needed moving to and from a bed to a chair (including a wheelchair)?: A Lot Help needed standing up from a chair using your arms (e.g., wheelchair or bedside chair)?: A Little Help needed to walk in hospital room?: Total Help needed climbing 3-5 steps with  a railing? : Total 6 Click Score: 13    End of Session Equipment Utilized During Treatment: Gait belt Activity Tolerance: Patient limited by fatigue(weakness) Patient left: in bed;with call bell/phone within reach;with bed alarm set Nurse Communication: Mobility status PT Visit Diagnosis: Muscle weakness (generalized) (M62.81);Difficulty in walking, not elsewhere classified (R26.2);Pain Pain - Right/Left: Left Pain - part of body: Leg    Time: 1341-1415 PT Time Calculation (min) (ACUTE ONLY): 34 min   Charges:   PT Evaluation $PT Eval Moderate Complexity: 1 Mod PT Treatments $Gait Training: 8-22 mins        Conni SlipperLaura Hurman Ketelsen, PT, DPT Acute Rehabilitation Services Pager: [phone] Office: [phone]   Marylynn PearsonLaura D Guillaume Weninger 08/30/2018, 3:04 PM

## 2018-08-30 NOTE — Plan of Care (Signed)

## 2018-08-30 NOTE — Care Management Obs Status (Signed)
MEDICARE OBSERVATION STATUS NOTIFICATION   Patient Details  Name: MAILIN PRESSMAN MRN: [id number] Date of Birth: 05/31/1929   Medicare Observation Status Notification Given:  Yes    Lawerance Sabal, RN 08/30/2018, 10:26 AM

## 2018-08-30 NOTE — Discharge Instructions (Signed)
1) take doxycycline and amoxicillin antibiotics as prescribed for the next 5 days 2) call your primary care physician's office on Wednesday, 09/02/2018 for possible follow-up--- your reevaluation may be done by phone by your primary care physician 3) home health physical therapy will come to your home 2 to 3 times a week to help you get stronger

## 2018-08-30 NOTE — Progress Notes (Signed)
Patient Demographics:    Emily Daniel, is a 85 y.o. female, DOB - 1929/11/29, ZOX:[id number]  Admit date - 08/29/2018   Admitting Physician Lahoma Crocker, MD  Outpatient Primary MD for the patient is Zola Button, Grayling Congress, DO  LOS - 0   Chief Complaint  Patient presents with  . Altered Mental Status  . Leg Swelling        Subjective:    Emily Daniel today has no fevers, no emesis,  No chest pain, no significant confusion, eating and drinking okay  Assessment  & Plan :    Principal Problem:   Cellulitis of left leg without foot Active Problems:   DM (diabetes mellitus) type II uncontrolled, periph vascular disorder (HCC)   Hyperlipidemia   Anxiety state   Essential hypertension   Atrial fibrillation (HCC)   Acute lower UTI   Acute metabolic encephalopathy   Brief Summary 85 y.o. 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 doxycycline okay to continue doxycycline to complete 10 days.  Amoxicillin added mostly for UTI, lower extremity venous Dopplers negative for acute DVT  2) Acute lower UTI: Patient with Aerococcus urinae UTI.  A fairly rare organism that is treated with amoxicillin.    ED practitioner discussed the case with ID who recommended this treatment-treat empirically with amoxicillin 500 3 times daily as recommended by ID physician  3) Acute metabolic encephalopathy: Likely related  to combined cellulitis and UTI.--- No significant confusion at this time, treat underlying cause and monitor closely.    Brain MRI with old infarcts, no new acute findings  4) Diabetes mellitus type 2 uncontrolled with peripheral vascular disorder:--A1c is 5.7, reflecting excellent diabetic control--- hold Januvia, sliding scale insulin coverage with Accu-Cheks for now    5) Hyperlipidemia: Continue pravastatin  6) HTN---  PTA was on lisinopril 40 mg daily and metoprolol 50 mg twice daily, continue above,  7) Paroxysmal Atrial fibrillation: Patient has a history of atrial fibrillation but currently is in a sinus rhythm.    Admitting provider had long discussion with the patient's son about why she is not on anticoagulation.  We have no records from her cardiologist , patient and son not eager to start anticoagulation at this time, defer decision to her cardiologist, use metoprolol for rate control, continue aspirin 81 mg for now  8)Generalized weakness/Debility----PT eval appreciated patient may need SNF rehab, cost with patient's son will await further PT eval in a.m. on 08/31/2018 to determine disposition SNF rehab versus home with home health therapy   Disposition/Need for in-Hospital Stay- patient unable to be discharged at this time due to please see #8 above  Code Status : Full  Family Communication:   Discussed with patient's son by phone   Disposition Plan  : Please see #8 above SNF rehab versus home with home  health  Consults  : EP discussed case with ID physician  DVT Prophylaxis  :  Lovenox -   Lab Results  Component Value Date   PLT 219 08/30/2018    Inpatient Medications  Scheduled Meds: . amoxicillin  500 mg Oral Q8H  . aspirin EC  81 mg Oral QHS  . calcium-vitamin D   Oral BID  . doxycycline  100 mg Oral BID  . enoxaparin (LOVENOX) injection  40 mg Subcutaneous Q24H  . ferrous sulfate  325 mg Oral BID WC  . insulin aspart  0-9 Units Subcutaneous TID WC  .  lisinopril  40 mg Oral Daily  . metoprolol tartrate  50 mg Oral BID  . multivitamin with minerals  1 tablet Oral Daily  . pravastatin  40 mg Oral Daily  . senna-docusate  1 tablet Oral QHS  . sodium chloride flush  3 mL Intravenous Q12H  . tetrahydrozoline  1 drop Both Eyes BID  . vitamin B-12  1,000 mcg Oral Daily   Continuous Infusions: . sodium chloride     PRN Meds:.sodium chloride, acetaminophen **OR** acetaminophen, hydrALAZINE, ondansetron **OR** ondansetron (ZOFRAN) IV, oxyCODONE, polyethylene glycol, sodium chloride flush    Anti-infectives (From admission, onward)   Start     Dose/Rate Route Frequency Ordered Stop   08/30/18 0000  doxycycline (VIBRA-TABS) 100 MG tablet     100 mg Oral 2 times daily 08/30/18 1242 09/04/18 2359   08/30/18 0000  amoxicillin (AMOXIL) 500 MG capsule     500 mg Oral 3 times daily 08/30/18 1242 09/04/18 2359   08/29/18 2200  doxycycline (VIBRA-TABS) tablet 100 mg     100 mg Oral 2 times daily 08/29/18 1736 09/04/18 2159   08/29/18 2200  amoxicillin (AMOXIL) capsule 500 mg     500 mg Oral Every 8 hours 08/29/18 1736 09/03/18 2159        Objective:   Vitals:   08/29/18 2152 08/29/18 2219 08/30/18 0418 08/30/18 0648  BP: (!) 168/59 (!) 131/54 (!) 157/69 (!) 158/66  Pulse: 66 76 69 61  Resp:  18 18   Temp:  98.1 F (36.7 C) 98.3 F (36.8 C)   TempSrc:  Oral Oral   SpO2:  98% 98%   Weight:      Height:        Wt Readings from Last 3 Encounters:  08/29/18 56.8 kg  07/28/18 58.5 kg  04/07/18 58.2 kg     Intake/Output Summary (Last 24 hours) at 08/30/2018 1300 Last data filed at 08/30/2018 0437 Gross per 24 hour  Intake 123 ml  Output 1 ml  Net 122 ml     Physical Exam Patient is examined daily including today on 08/30/2018 , exams remain the same as of yesterday except that has changed   Gen:- Awake Alert, in no acute distress, speaking in complete sentences HEENT:- [place].AT, No sclera icterus Neck-Supple Neck,No JVD,.  Lungs-   CTAB , fair symmetrical air movement CV- S1, S2 normal, regular (history of atrial fibrillation but currently regular) Abd-  +ve B.Sounds, Abd Soft, No tenderness,    Extremity/Skin:- No  edema, pedal pulses present  Psych-affect is appropriate, oriented x3----no further confusional episodes Neuro-generalized weakness, no new focal deficits, no tremors   Data Review:   Micro Results Recent Results (from the past 240 hour(s))  Culture, Urine     Status: Abnormal   Collection Time: 08/25/18  7:51 PM  Result Value Ref Range Status   Specimen Description  Final    URINE, CLEAN CATCH Performed at [facility], [address], [place], [place] [address]    Special Requests   Final    NONE Performed at [facility], [address], [place], [place] [address]    Culture (A)  Final    >=100,000 COLONIES/mL AEROCOCCUS URINAE Standardized susceptibility testing for this organism is not available. Performed at [facility], [address] [place], [place] [address]    Report Status 08/28/2018 FINAL  Final  Urine culture     Status: None (Preliminary result)   Collection Time: 08/29/18  3:30 PM  Result Value Ref Range Status   Specimen Description URINE, RANDOM  Final   Special Requests NONE  Final   Culture   Final    CULTURE REINCUBATED FOR BETTER GROWTH Performed at [facility], [address], [place], [place] [address]    Report Status PENDING  Incomplete    Radiology Reports Mr Brain Wo Contrast  Result Date: 08/25/2018 CLINICAL DATA:  Transient slurred speech this afternoon. History of tick bite, AVM, diabetes, hypertension and hyperlipidemia. EXAM: MRI HEAD WITHOUT CONTRAST TECHNIQUE: Multiplanar, multiecho pulse sequences of the brain and surrounding structures were obtained without intravenous contrast. COMPARISON:  None. FINDINGS: Mild motion degraded examination. INTRACRANIAL CONTENTS: No reduced diffusion to suggest acute  ischemia a few scattered chronic microhemorrhages noted. No parenchymal brain volume loss for age. No hydrocephalus. Old bilateral small cerebellar infarcts. Old LEFT thalamus and possible basal ganglia lacunar infarcts. Confluent supratentorial and patchy pontine white matter FLAIR T2 hyperintensities. Prominent basal ganglia perivascular spaces associated with chronic small vessel ischemic changes. Small bilateral frontal convexity encephalomalacia versus motion artifact. No suspicious parenchymal signal, masses, mass effect. No abnormal extra-axial fluid collections. No extra-axial masses. VASCULAR: Normal major intracranial vascular flow voids present at skull base. SKULL AND UPPER CERVICAL SPINE: No abnormal sellar expansion. No suspicious calvarial bone marrow signal. Craniocervical junction maintained. SINUSES/ORBITS: Included paranasal sinuses are well aerated. Minimal mastoid effusions. Included ocular globes and orbital contents are non-suspicious. Status post bilateral ocular lens implants. OTHER: None. IMPRESSION: 1. Mild motion degraded examination.  No acute intracranial process. 2. Moderate to severe chronic small vessel ischemic changes. Old cerebellar and deep gray nuclei infarcts. Old small frontal infarcts versus motion artifact. Electronically Signed   By: Awilda Metro M.D.   On: 08/25/2018 19:35   Vas [place] Lower Extremity Venous (dvt) (only [facility])  Result Date: 08/30/2018  Lower Venous Study Indications: Edema, and Multiple tick bites.  Comparison Study: No prior study on file for comparison Performing Technologist: Sherren Kerns RVS  Examination Guidelines: A complete evaluation includes B-mode imaging, spectral Doppler, color Doppler, and power Doppler as needed of all accessible portions of each vessel. Bilateral testing is considered an integral part of a complete examination. Limited examinations for reoccurring indications may be performed as noted.  Right Venous Findings:  +---+---------------+---------+-----------+----------+-------+    CompressibilityPhasicitySpontaneityPropertiesSummary +---+---------------+---------+-----------+----------+-------+ CFVFull           Yes      Yes                          +---+---------------+---------+-----------+----------+-------+  Left Venous Findings: +---------+---------------+---------+-----------+----------+-------+          CompressibilityPhasicitySpontaneityPropertiesSummary +---------+---------------+---------+-----------+----------+-------+ CFV      Full           Yes      Yes                          +---------+---------------+---------+-----------+----------+-------+  SFJ      Full                                                 +---------+---------------+---------+-----------+----------+-------+ FV Prox  Full                                                 +---------+---------------+---------+-----------+----------+-------+ FV Mid   Full                                                 +---------+---------------+---------+-----------+----------+-------+ FV DistalFull                                                 +---------+---------------+---------+-----------+----------+-------+ PFV      Full                                                 +---------+---------------+---------+-----------+----------+-------+ POP      Full           Yes      Yes                          +---------+---------------+---------+-----------+----------+-------+ PTV      Full                                                 +---------+---------------+---------+-----------+----------+-------+ PERO     Full                                                 +---------+---------------+---------+-----------+----------+-------+ GSV      Full                                                 +---------+---------------+---------+-----------+----------+-------+    Summary: Right: No  evidence of common femoral vein obstruction. Left: No evidence of common femoral vein obstruction. Ultrasound characteristics of enlarged lymph nodes noted in the groin.  *See table(s) above for measurements and observations. Electronically signed by Sherald Hess MD on 08/30/2018 at 9:06:31 AM.    Final      CBC Recent Labs  Lab 08/25/18 1737 08/29/18 1411 08/30/18 0624  WBC 9.5 7.8 7.5  HGB 10.3* 9.9* 9.2*  HCT 31.5* 29.2* 28.3*  PLT 167 239 219  MCV 106.4* 102.1* 101.8*  MCH 34.8* 34.6* 33.1  MCHC 32.7 33.9 32.5  RDW 12.2 11.9 11.9  LYMPHSABS 1.7  1.7  --   MONOABS 1.5* 1.1*  --   EOSABS 0.0 0.0  --   BASOSABS 0.0 0.0  --     Chemistries  Recent Labs  Lab 08/25/18 1737 08/29/18 1411 08/30/18 0624  NA 137 141 140  K 3.9 3.7 3.9  CL 104 109 109  CO2 22 21* 22  GLUCOSE 145* 132* 147*  BUN 30* 18 16  CREATININE 1.65* 1.03* 1.07*  CALCIUM 9.0 9.5 9.2  AST 29  --   --   ALT 23  --   --   ALKPHOS 40  --   --   BILITOT 0.9  --   --    ------------------------------------------------------------------------------------------------------------------ No results for input(s): CHOL, HDL, LDLCALC, TRIG, CHOLHDL, LDLDIRECT in the last 72 hours.  Lab Results  Component Value Date   HGBA1C 5.7 (H) 08/29/2018   ------------------------------------------------------------------------------------------------------------------ Recent Labs    08/30/18 0624  TSH 0.843   ------------------------------------------------------------------------------------------------------------------ No results for input(s): VITAMINB12, FOLATE, FERRITIN, TIBC, IRON, RETICCTPCT in the last 72 hours.  Coagulation profile Recent Labs  Lab 08/25/18 1737  INR 1.2    No results for input(s): DDIMER in the last 72 hours.  Cardiac Enzymes No results for input(s): CKMB, TROPONINI, MYOGLOBIN in the last 168 hours.  Invalid input(s): CK  ------------------------------------------------------------------------------------------------------------------ No results found for: BNP   Shon Hale M.D on 08/30/2018 at 1:00 PM  Go to [email or web address] - for contact info  [facility] - Office  [phone]

## 2018-08-30 NOTE — TOC Transition Note (Signed)
Transition of Care [facility]) - CM/SW Discharge Note   Patient Details  Name: Emily Daniel MRN: [id number] Date of Birth: [birth year]  Transition of Care [facility]) CM/SW Contact:  Lawerance Sabal, RN Phone Number: 08/30/2018, 12:55 PM   Clinical Narrative:     Sherron Monday w patient's son Akansha Harrower who selected [facility]. He declined needs for additional DME. Referral accepted, no other CM needs.    Final next level of care: Home w Home Health Services Barriers to Discharge: No Barriers Identified   Patient Goals and CMS Choice Patient states their goals for this hospitalization and ongoing recovery are:: return home CMS Medicare.gov Compare Post Acute Care list provided to:: Patient Represenative (must comment) Choice offered to / list presented to : Adult Children  Discharge Placement                       Discharge Plan and Services     Post Acute Care Choice: Home Health              HH Arranged: PT HH Agency: [facility]   Social Determinants of Health (SDOH) Interventions     Readmission Risk Interventions No flowsheet data found.

## 2018-08-31 ENCOUNTER — Encounter (HOSPITAL_COMMUNITY): Payer: Self-pay | Admitting: Anesthesiology

## 2018-08-31 ENCOUNTER — Observation Stay (HOSPITAL_COMMUNITY): Payer: Medicare Other

## 2018-08-31 ENCOUNTER — Inpatient Hospital Stay (HOSPITAL_COMMUNITY): Payer: Medicare Other

## 2018-08-31 ENCOUNTER — Encounter (HOSPITAL_COMMUNITY): Payer: Self-pay | Admitting: Radiology

## 2018-08-31 ENCOUNTER — Encounter (HOSPITAL_COMMUNITY)
Admission: EM | Disposition: A | Discharge status: Hospice | Payer: Self-pay | Source: Home / Self Care | Attending: Neurology

## 2018-08-31 ENCOUNTER — Telehealth: Payer: Self-pay | Admitting: *Deleted

## 2018-08-31 DIAGNOSIS — R52 Pain, unspecified: Secondary | ICD-10-CM | POA: Diagnosis not present

## 2018-08-31 DIAGNOSIS — I63412 Cerebral infarction due to embolism of left middle cerebral artery: Secondary | ICD-10-CM | POA: Diagnosis not present

## 2018-08-31 DIAGNOSIS — K661 Hemoperitoneum: Secondary | ICD-10-CM | POA: Diagnosis not present

## 2018-08-31 DIAGNOSIS — I10 Essential (primary) hypertension: Secondary | ICD-10-CM | POA: Diagnosis not present

## 2018-08-31 DIAGNOSIS — E785 Hyperlipidemia, unspecified: Secondary | ICD-10-CM | POA: Diagnosis present

## 2018-08-31 DIAGNOSIS — I2693 Single subsegmental pulmonary embolism without acute cor pulmonale: Secondary | ICD-10-CM | POA: Diagnosis not present

## 2018-08-31 DIAGNOSIS — I6522 Occlusion and stenosis of left carotid artery: Secondary | ICD-10-CM | POA: Diagnosis not present

## 2018-08-31 DIAGNOSIS — I63512 Cerebral infarction due to unspecified occlusion or stenosis of left middle cerebral artery: Secondary | ICD-10-CM | POA: Diagnosis not present

## 2018-08-31 DIAGNOSIS — N390 Urinary tract infection, site not specified: Secondary | ICD-10-CM | POA: Diagnosis not present

## 2018-08-31 DIAGNOSIS — I499 Cardiac arrhythmia, unspecified: Secondary | ICD-10-CM | POA: Diagnosis not present

## 2018-08-31 DIAGNOSIS — I110 Hypertensive heart disease with heart failure: Secondary | ICD-10-CM | POA: Diagnosis present

## 2018-08-31 DIAGNOSIS — I639 Cerebral infarction, unspecified: Secondary | ICD-10-CM | POA: Diagnosis not present

## 2018-08-31 DIAGNOSIS — M810 Age-related osteoporosis without current pathological fracture: Secondary | ICD-10-CM | POA: Diagnosis present

## 2018-08-31 DIAGNOSIS — K649 Unspecified hemorrhoids: Secondary | ICD-10-CM | POA: Diagnosis present

## 2018-08-31 DIAGNOSIS — I63232 Cerebral infarction due to unspecified occlusion or stenosis of left carotid arteries: Secondary | ICD-10-CM | POA: Diagnosis not present

## 2018-08-31 DIAGNOSIS — I2510 Atherosclerotic heart disease of native coronary artery without angina pectoris: Secondary | ICD-10-CM | POA: Diagnosis present

## 2018-08-31 DIAGNOSIS — I6389 Other cerebral infarction: Secondary | ICD-10-CM | POA: Diagnosis not present

## 2018-08-31 DIAGNOSIS — R58 Hemorrhage, not elsewhere classified: Secondary | ICD-10-CM | POA: Diagnosis not present

## 2018-08-31 DIAGNOSIS — I619 Nontraumatic intracerebral hemorrhage, unspecified: Secondary | ICD-10-CM | POA: Diagnosis not present

## 2018-08-31 DIAGNOSIS — R4020 Unspecified coma: Secondary | ICD-10-CM | POA: Diagnosis not present

## 2018-08-31 DIAGNOSIS — Z515 Encounter for palliative care: Secondary | ICD-10-CM | POA: Diagnosis not present

## 2018-08-31 DIAGNOSIS — L03116 Cellulitis of left lower limb: Secondary | ICD-10-CM | POA: Diagnosis not present

## 2018-08-31 DIAGNOSIS — M2550 Pain in unspecified joint: Secondary | ICD-10-CM | POA: Diagnosis not present

## 2018-08-31 DIAGNOSIS — I611 Nontraumatic intracerebral hemorrhage in hemisphere, cortical: Secondary | ICD-10-CM | POA: Diagnosis not present

## 2018-08-31 DIAGNOSIS — K689 Other disorders of retroperitoneum: Secondary | ICD-10-CM | POA: Diagnosis not present

## 2018-08-31 DIAGNOSIS — G8191 Hemiplegia, unspecified affecting right dominant side: Secondary | ICD-10-CM | POA: Diagnosis not present

## 2018-08-31 DIAGNOSIS — W57XXXA Bitten or stung by nonvenomous insect and other nonvenomous arthropods, initial encounter: Secondary | ICD-10-CM | POA: Diagnosis present

## 2018-08-31 DIAGNOSIS — G9341 Metabolic encephalopathy: Secondary | ICD-10-CM | POA: Diagnosis not present

## 2018-08-31 DIAGNOSIS — R4701 Aphasia: Secondary | ICD-10-CM | POA: Diagnosis not present

## 2018-08-31 DIAGNOSIS — Z7401 Bed confinement status: Secondary | ICD-10-CM | POA: Diagnosis not present

## 2018-08-31 DIAGNOSIS — D62 Acute posthemorrhagic anemia: Secondary | ICD-10-CM | POA: Diagnosis not present

## 2018-08-31 DIAGNOSIS — Z66 Do not resuscitate: Secondary | ICD-10-CM | POA: Diagnosis not present

## 2018-08-31 DIAGNOSIS — N179 Acute kidney failure, unspecified: Secondary | ICD-10-CM | POA: Diagnosis present

## 2018-08-31 DIAGNOSIS — J90 Pleural effusion, not elsewhere classified: Secondary | ICD-10-CM | POA: Diagnosis not present

## 2018-08-31 DIAGNOSIS — I480 Paroxysmal atrial fibrillation: Secondary | ICD-10-CM | POA: Diagnosis not present

## 2018-08-31 DIAGNOSIS — R0689 Other abnormalities of breathing: Secondary | ICD-10-CM | POA: Diagnosis not present

## 2018-08-31 DIAGNOSIS — E538 Deficiency of other specified B group vitamins: Secondary | ICD-10-CM | POA: Diagnosis present

## 2018-08-31 DIAGNOSIS — R410 Disorientation, unspecified: Secondary | ICD-10-CM | POA: Diagnosis present

## 2018-08-31 DIAGNOSIS — Q2730 Arteriovenous malformation, site unspecified: Secondary | ICD-10-CM | POA: Diagnosis not present

## 2018-08-31 DIAGNOSIS — D72829 Elevated white blood cell count, unspecified: Secondary | ICD-10-CM | POA: Diagnosis not present

## 2018-08-31 DIAGNOSIS — E1151 Type 2 diabetes mellitus with diabetic peripheral angiopathy without gangrene: Secondary | ICD-10-CM | POA: Diagnosis present

## 2018-08-31 DIAGNOSIS — I5042 Chronic combined systolic (congestive) and diastolic (congestive) heart failure: Secondary | ICD-10-CM | POA: Diagnosis present

## 2018-08-31 DIAGNOSIS — N3000 Acute cystitis without hematuria: Secondary | ICD-10-CM | POA: Diagnosis not present

## 2018-08-31 DIAGNOSIS — I255 Ischemic cardiomyopathy: Secondary | ICD-10-CM | POA: Diagnosis present

## 2018-08-31 DIAGNOSIS — R531 Weakness: Secondary | ICD-10-CM | POA: Diagnosis not present

## 2018-08-31 HISTORY — PX: IR PATIENT EVAL TECH 0-60 MINS: IMG5564

## 2018-08-31 LAB — ECHOCARDIOGRAM LIMITED BUBBLE STUDY
Height: 60 in
Weight: 2003.54 oz

## 2018-08-31 LAB — CBC
HCT: 30.1 % — ABNORMAL LOW (ref 36.0–46.0)
Hemoglobin: 9.9 g/dL — ABNORMAL LOW (ref 12.0–15.0)
MCH: 33.1 pg (ref 26.0–34.0)
MCHC: 32.9 g/dL (ref 30.0–36.0)
MCV: 100.7 fL — ABNORMAL HIGH (ref 80.0–100.0)
Platelets: 237 10*3/uL (ref 150–400)
RBC: 2.99 MIL/uL — ABNORMAL LOW (ref 3.87–5.11)
RDW: 11.9 % (ref 11.5–15.5)
WBC: 12.3 10*3/uL — ABNORMAL HIGH (ref 4.0–10.5)
nRBC: 0 % (ref 0.0–0.2)

## 2018-08-31 LAB — TROPONIN I
Troponin I: <0.03 ng/mL (ref <0.03)
Troponin I: <0.03 ng/mL (ref <0.03)
Troponin I: <0.03 ng/mL (ref <0.03)

## 2018-08-31 LAB — COMPREHENSIVE METABOLIC PANEL
ALT: 26 U/L (ref 0–44)
AST: 27 U/L (ref 15–41)
Albumin: 3.1 g/dL — ABNORMAL LOW (ref 3.5–5.0)
Alkaline Phosphatase: 43 U/L (ref 38–126)
Anion gap: 10 (ref 5–15)
BUN: 16 mg/dL (ref 8–23)
CO2: 21 mmol/L — ABNORMAL LOW (ref 22–32)
Calcium: 9 mg/dL (ref 8.9–10.3)
Chloride: 110 mmol/L (ref 98–111)
Creatinine, Ser: 1.13 mg/dL — ABNORMAL HIGH (ref 0.44–1.00)
GFR calc Af Amer: 51 mL/min — ABNORMAL LOW (ref >60)
GFR calc non Af Amer: 44 mL/min — ABNORMAL LOW (ref >60)
Glucose, Bld: 161 mg/dL — ABNORMAL HIGH (ref 70–99)
Potassium: 3.9 mmol/L (ref 3.5–5.1)
Sodium: 141 mmol/L (ref 135–145)
Total Bilirubin: 0.7 mg/dL (ref 0.3–1.2)
Total Protein: 6 g/dL — ABNORMAL LOW (ref 6.5–8.1)

## 2018-08-31 LAB — PROTIME-INR
INR: 1.3 — ABNORMAL HIGH (ref 0.8–1.2)
Prothrombin Time: 16 seconds — ABNORMAL HIGH (ref 11.4–15.2)

## 2018-08-31 LAB — GLUCOSE, CAPILLARY
Glucose-Capillary: 162 mg/dL — ABNORMAL HIGH (ref 70–99)
Glucose-Capillary: 145 mg/dL — ABNORMAL HIGH (ref 70–99)
Glucose-Capillary: 129 mg/dL — ABNORMAL HIGH (ref 70–99)

## 2018-08-31 LAB — URINE CULTURE: Culture: 20000 — AB

## 2018-08-31 LAB — MRSA PCR SCREENING: MRSA by PCR: NEGATIVE

## 2018-08-31 SURGERY — IR WITH ANESTHESIA
Anesthesia: Choice

## 2018-08-31 MED ORDER — IOHEXOL 350 MG/ML SOLN
75.0000 mL | Freq: Once | INTRAVENOUS | Status: AC | PRN
  Administered 2018-08-31: 75 mL via INTRAVENOUS

## 2018-08-31 MED ORDER — TIROFIBAN HCL IN NACL 5-0.9 MG/100ML-% IV SOLN
INTRAVENOUS | Status: AC
  Filled 2018-08-31: qty 100

## 2018-08-31 MED ORDER — LABETALOL HCL 5 MG/ML IV SOLN
5.0000 mg | INTRAVENOUS | Status: DC | PRN
  Administered 2018-08-31 – 2018-09-05 (×6): 5 mg via INTRAVENOUS
  Filled 2018-08-31 (×5): qty 4

## 2018-08-31 MED ORDER — NITROGLYCERIN 0.4 MG SL SUBL
0.4000 mg | SUBLINGUAL_TABLET | SUBLINGUAL | Status: DC | PRN
  Administered 2018-08-31: 0.4 mg via SUBLINGUAL

## 2018-08-31 MED ORDER — METOPROLOL TARTRATE 5 MG/5ML IV SOLN
5.0000 mg | Freq: Three times a day (TID) | INTRAVENOUS | Status: DC
  Administered 2018-08-31 – 2018-09-01 (×4): 5 mg via INTRAVENOUS
  Filled 2018-08-31 (×3): qty 5

## 2018-08-31 MED ORDER — ASPIRIN 325 MG PO TABS
ORAL_TABLET | ORAL | Status: AC
  Filled 2018-08-31: qty 1

## 2018-08-31 MED ORDER — INSULIN ASPART 100 UNIT/ML ~~LOC~~ SOLN
0.0000 [IU] | Freq: Four times a day (QID) | SUBCUTANEOUS | Status: DC
  Administered 2018-09-03 – 2018-09-04 (×2): 0 [IU] via SUBCUTANEOUS
  Administered 2018-09-05: 2 [IU] via SUBCUTANEOUS

## 2018-08-31 MED ORDER — SODIUM CHLORIDE 0.9 % IV SOLN
INTRAVENOUS | Status: DC
  Administered 2018-08-31: 10:00:00 via INTRAVENOUS

## 2018-08-31 MED ORDER — CEFAZOLIN SODIUM-DEXTROSE 1-4 GM/50ML-% IV SOLN
1.0000 g | Freq: Two times a day (BID) | INTRAVENOUS | Status: DC
  Administered 2018-08-31 – 2018-09-01 (×2): 1 g via INTRAVENOUS
  Filled 2018-08-31 (×4): qty 50

## 2018-08-31 MED ORDER — ALTEPLASE (STROKE) FULL DOSE INFUSION
0.9000 mg/kg | Freq: Once | INTRAVENOUS | Status: AC
  Administered 2018-08-31: 51.1 mg via INTRAVENOUS
  Filled 2018-08-31: qty 100

## 2018-08-31 MED ORDER — SENNOSIDES-DOCUSATE SODIUM 8.6-50 MG PO TABS: 1.0000 | ORAL_TABLET | Freq: Every evening | ORAL | Status: DC | PRN

## 2018-08-31 MED ORDER — CLOPIDOGREL BISULFATE 300 MG PO TABS
ORAL_TABLET | ORAL | Status: AC
  Filled 2018-08-31: qty 1

## 2018-08-31 MED ORDER — SODIUM CHLORIDE 0.9 % IV SOLN
100.0000 mg | Freq: Two times a day (BID) | INTRAVENOUS | Status: DC
  Administered 2018-08-31 – 2018-09-01 (×3): 100 mg via INTRAVENOUS
  Filled 2018-08-31 (×5): qty 100

## 2018-08-31 MED ORDER — CEFAZOLIN SODIUM-DEXTROSE 1-4 GM/50ML-% IV SOLN
1.0000 g | Freq: Three times a day (TID) | INTRAVENOUS | Status: DC
  Filled 2018-08-31: qty 50

## 2018-08-31 MED ORDER — FENTANYL CITRATE (PF) 100 MCG/2ML IJ SOLN
INTRAMUSCULAR | Status: AC
  Filled 2018-08-31: qty 2

## 2018-08-31 MED ORDER — MORPHINE SULFATE (PF) 2 MG/ML IV SOLN
INTRAVENOUS | Status: AC
  Administered 2018-08-31: 2 mg via INTRAVENOUS
  Filled 2018-08-31: qty 1

## 2018-08-31 MED ORDER — CLEVIDIPINE BUTYRATE 0.5 MG/ML IV EMUL
0.0000 mg/h | INTRAVENOUS | Status: DC
  Administered 2018-08-31 – 2018-09-01 (×2): 1 mg/h via INTRAVENOUS
  Filled 2018-08-31 (×4): qty 50

## 2018-08-31 MED ORDER — TICAGRELOR 90 MG PO TABS
ORAL_TABLET | ORAL | Status: AC
  Filled 2018-08-31: qty 2

## 2018-08-31 MED ORDER — NITROGLYCERIN 0.4 MG SL SUBL
SUBLINGUAL_TABLET | SUBLINGUAL | Status: AC
  Administered 2018-08-31: 0.4 mg
  Filled 2018-08-31: qty 1

## 2018-08-31 MED ORDER — MORPHINE SULFATE (PF) 2 MG/ML IV SOLN
2.0000 mg | Freq: Once | INTRAVENOUS | Status: AC
  Administered 2018-08-31: 2 mg via INTRAVENOUS

## 2018-08-31 MED ORDER — STROKE: EARLY STAGES OF RECOVERY BOOK
Freq: Once | Status: AC
  Administered 2018-08-31: 10:00:00

## 2018-08-31 NOTE — Procedures (Signed)
ELECTROENCEPHALOGRAM REPORT   Patient: Emily Daniel       Room #: 0Y77A EEG No. ID: [id number] Age: 85 y.o.        Sex: female Referring Physician: Aroor Report Date:  08/31/2018        Interpreting Physician: Thana Farr  History: Emily Daniel is an 85 y.o. female with altered mental status and acute onset right sided weakness  Medications:  Ancef, Vibramycin, Insulin, Lopressor, Pravachol  Conditions of Recording:  This is a 21 channel routine scalp EEG performed with bipolar and monopolar montages arranged in accordance to the international 10/20 system of electrode placement. One channel was dedicated to EKG recording.  The patient is in the altered state.  Description:  Artifact is prominent during the recording often obscuring the background rhythm. When able to be visualized the background is slow and poorly organized.   It consists of a low voltage polymorphic delta rhythm that dominates and is diffusely distributed and continuous.  Some intermittent theta activity is noted as well.   No epileptiform activity is noted.   Hyperventilation and intermittent photic stimulation were not performed.  IMPRESSION: This is an abnormal EEG secondary to general background slowing.  This finding may be seen with a diffuse disturbance that is etiologically nonspecific, but may include a metabolic encephalopathy.  Can not rule out normal drowse as well.  No epileptiform activity was noted.     Thana Farr, MD Neurology [phone] 08/31/2018, 12:38 PM

## 2018-08-31 NOTE — Progress Notes (Signed)
Pharmacist Code Stroke Response  Notified to mix tPA at 0750 by Dr. Laurence Slate Delivered tPA to RN at [phone]  tPA dose = 5.1mg  bolus over 1 minute followed by 46mg  for a total dose of 51.1mg  over 1 hour  Issues/delays encountered (if applicable): RN needed to get another line prior to CTA.   Babs Bertin, PharmD, BCPS Please check AMION for all [facility] contact numbers Clinical Pharmacist 08/31/2018 8:02 AM

## 2018-08-31 NOTE — Progress Notes (Signed)
  Echocardiogram 2D Echocardiogram has been performed.  A limited echocardiogram was completed in accordance to the Director's protocol to limit patient to technician exposure during COVID 19.  Emily Daniel 08/31/2018, 3:13 PM

## 2018-08-31 NOTE — Progress Notes (Signed)
Currently awaiting IV team nurse to place second IV so that RN can administer cleviprex (incompatible with antibiotics patient is receiving through only IV).  Will continue to give prn labetalol unitl patient has another IV.

## 2018-08-31 NOTE — Progress Notes (Signed)
PT Cancellation Note  Patient Details Name: Emily Daniel MRN: [id number] DOB: Jan 04, 1930   Cancelled Treatment:    Reason Eval/Treat Not Completed: (P) Medical issues which prohibited therapy(Pt off unit for IR procedure with transfer to 4N ICU.  PT to follow up next visit.  )   Gregory Barrick Artis Delay 08/31/2018, 9:53 AM

## 2018-08-31 NOTE — Progress Notes (Signed)
Paged on call Neuro MD regarding patient's systolic bp being above 180 despite prn meds.  Orders given to start to patient on cleviprex.  Will administer and observe.

## 2018-08-31 NOTE — Significant Event (Addendum)
Rapid Response Event Note  Overview:Called d/t htn and chest pain Time Called: 0528 Arrival Time: 0532 Event Type: Cardiac  Initial Focused Assessment: Pt laying in bed, holding chest, c/o 10/10 chest pain.  Pt alert to self and place, intermittently confused at baseline. HR-60s, SBP-175, RR-20, SpO2-97% on RA, skin warm and dry.  EKG done. SL NTG X2 and 2mg  morphine given.  Pt falling asleep c/o no pain after interventions. Will get troponin  Interventions: EKG-1st degree HB SL ntg X 2 2mg  morphine IV STAT Troponin Plan of Care (if not transferred): Notify NP if troponin abnormal. Continue to monitor pt.  Call RRT if further assistance needed. Event Summary: Name of Physician Notified: Bodenheimer, NP at [phone]    at    Outcome: Stayed in room and stabalized  Event End Time: 6546  Terrilyn Saver

## 2018-08-31 NOTE — Progress Notes (Signed)
Aware of IR request for possible image-guided diagnostic cerebral arteriogram.  Procedure has been approved by Dr. Corliss Skains and is scheduled tentatively for 09/01/2018. Patient will be NPO at midnight. Consent obtained from son, Zoeya Styers, via telephone. Will see patient in IR RN station tomorrow prior to procedure.  Please call IR with questions/concerns.  Waylan Boga Louk, PA-C 08/31/2018, 11:17 AM

## 2018-08-31 NOTE — Procedures (Signed)
Paged out as code stroke for IR intervention, Neurologist Aroor cancelled after tray and supplies were opened.

## 2018-08-31 NOTE — Anesthesia Preprocedure Evaluation (Deleted)
Anesthesia Evaluation  Patient identified by MRN, date of birth, ID band Patient awake    Reviewed: Allergy & Precautions, H&P , NPO status , Patient's Chart, lab work & pertinent test results, reviewed documented beta blocker date and time   Airway        Dental no notable dental hx.    Pulmonary neg pulmonary ROS, former smoker,    Pulmonary exam normal        Cardiovascular hypertension, Pt. on medications and Pt. on home beta blockers + CAD, + Past MI, + CABG, + Peripheral Vascular Disease and +CHF  + dysrhythmias Atrial Fibrillation      Neuro/Psych Anxiety CVA, Residual Symptoms    GI/Hepatic negative GI ROS, Neg liver ROS,   Endo/Other  diabetes, Type 2, Oral Hypoglycemic Agents  Renal/GU negative Renal ROS  negative genitourinary   Musculoskeletal   Abdominal   Peds  Hematology  (+) Blood dyscrasia, anemia ,   Anesthesia Other Findings   Reproductive/Obstetrics negative OB ROS                             Anesthesia Physical Anesthesia Plan  ASA: III and emergent  Anesthesia Plan: General   Post-op Pain Management:    Induction: Intravenous, Rapid sequence and Cricoid pressure planned  PONV Risk Score and Plan: 3 and Ondansetron and Treatment may vary due to age or medical condition  Airway Management Planned: Oral ETT  Additional Equipment: Arterial line  Intra-op Plan:   Post-operative Plan: Post-operative intubation/ventilation  Informed Consent: I have reviewed the patients History and Physical, chart, labs and discussed the procedure including the risks, benefits and alternatives for the proposed anesthesia with the patient or authorized representative who has indicated his/her understanding and acceptance.     Dental advisory given  Plan Discussed with: CRNA  Anesthesia Plan Comments:         Anesthesia Quick Evaluation

## 2018-08-31 NOTE — Progress Notes (Signed)
EEG completed, results pending. 

## 2018-08-31 NOTE — Telephone Encounter (Signed)
I had seen that

## 2018-08-31 NOTE — Progress Notes (Addendum)
Writer in patient's room getting report from night nurse. Patient alert, with her eyes open, but unresponsive. She couldn't answer to any questions. BP 145/49; Pulse 74; HR 78. Resp 18 and sat O2 97 on RA. Patient able to move and hold up left hand and leg, but not right hand or right leg; sensation positive in both legs; both legs rigid, left more then right; right facial droop; MD notified, rapid response called, code stroke called. IV fluids were started and patient was transported to CT.

## 2018-08-31 NOTE — Significant Event (Addendum)
Rapid Response Event Note  Overview: CODE STROKE   Initial Focused Assessment: Called by 5W nursing staff about patient not being able too talk and unable to move the right side of her body. Per staff LSN 0500. Patient was seen earlier at 0500 for chest pain. Upon arrival, patient was alert but aphasic, RUE flaccid, RLE minimal movement with pain. Questionable facial weakness. Code Stroke called at 725. I spoke with Neuro MD briefly, informed MD that we would proceed to CT. Patient was flat in bed and NS was started. SBP 130-140s, HR 70-80s. Not in acute distress.   Upon arrival to CT, CT HEAD was completed, negative for bleed. NIH 22. Decision made to administer TPA. SBP/DBP within normal range for TPA and blood sugar was obtained prior to arrival to CT, it was 162. TPA started at 0756 (see MAR). CTA was ordered, patient only had 22G PIV, multiple attempts by myself to start new 20G PIV, unsuccessful. IV VAST RN paged, placed PIV with US guidance and CTA was performed. CTA - negative for LVO. Severe LICA stenosis. NVIR was paged and team was activated but cancelled d/t no LVO.  Of Note: + Subsegmental PE in RUL  Interventions: -- STAT HEAD CT -- STAT CTA  -- TPA   Plan of Care: -- Transferred to NTICU from CT.   Event Summary: 5W nurses paged [facility] MD prior to my arrival.    at     Call Time 719 Arrival Time 721 Code Stroke Called 725 Code Stroke Page 725 End Time 2045  Aadan Chenier R

## 2018-08-31 NOTE — Consult Note (Signed)
Neurology Consultation  Reason for Consult: Altered mental status and right-sided weakness. Referring Physician: Shon Hale MD  CC: Altered mental status and right-sided weakness.  History is obtained from: Chart review and rapid response nurses.  HPI: Emily Daniel is a 85 y.o. female with PMHx significant for CAD S/P CABG, PAD, HFrEF, paroxysmal A. Fib. Not on anticoagulation, AVMs, and multiple areas of small frontal infarcts on MRI was admitted on 08/29/18 due to confusion and worsening mental status.  Patient was recently seen in ED and was placed on antibiotics due to UTI and cellulitis.  Patient complained of acute chest pain around 5:30 AM, EKG was obtained with she was without any significant ST changes, pain relieved with nitro and morphine given by rapid response.  1 hour later she had some active emesis which also resolved with antiemetic.  Around 7:15 AM during shift change they noticed that patient is not communicating and not responding to any command, along with some right facial droop, not moving right extremities.  Rapid response and code stroke was called patient was taken to CT.  LKW: 5:30 AM tpa given?: yes Premorbid modified Rankin scale (mRS): 4 0-Completely asymptomatic and back to baseline post-stroke 1-No significant post stroke disability and can perform usual duties with stroke symptoms 2-Slight disability-UNABLE to perform all activities but does not need assistance  3-Moderate disability-requires help but walks WITHOUT assistance 4-Needs assistance to walk and tend to bodily needs 5-Severe disability-bedridden, incontinent, needs constant attention 6- Death   ROS: Unable to obtain due to altered mental status.   Past Medical History:  Diagnosis Date  . Anxiety   . Atrial fibrillation (HCC)    post op >>> req'd Amio and Coumadin  . AVM (arteriovenous malformation)   . CAD (coronary artery disease)    a. NSTEMI 10/2013 >>> LHC (10/2013):  Distal left  main 90%, proximal LAD 30%, mid LAD 20%, ostial circumflex 99%, then 80%, mid RCA 99%, EF 30-35%, 3-4+ MR; proximal right iliac occluded, right renal 50%, ostial left iliac 70% >>> CABG/MV repair  . Cataract   . CHF (congestive heart failure) (HCC)    Dr. Dorris Fetch following  . Chronic systolic heart failure (HCC)   . Diabetes mellitus, type 2 (HCC)   . Hemorrhoids   . Hx of echocardiogram    Echo (9/15):  Mild LVH, EF 40-45%, inf AK, Gr 1 DD, trivial AI, MV repair ok (mild MS, mild MR), mild LAE, PASP 32 mmHg.  [name] Hyperlipidemia   . Hypertension   . Ischemic cardiomyopathy    a. Echo (11/08/13):  EF 30-35%, diffuse hypokinesis, severe MR, mild LAE, PASP 48 mm Hg  . Mitral regurgitation    a. s/p MV repair at time of CABG  . Osteoporosis   . PVD (peripheral vascular disease) (HCC)   . Vitamin B12 deficiency     Family History  Problem Relation Age of Onset  . Cervical cancer Mother   . Cancer Brother        renal ...x3??  [name] Hypertension Other   . Stroke Other   . Hypertension Sister   . Heart attack Neg Hx     Social History:   reports that she quit smoking about 34 years ago. Her smoking use included cigarettes. She has a 36.00 pack-year smoking history. She has never used smokeless tobacco. She reports that she does not drink alcohol or use drugs.  Medications  Current Facility-Administered Medications:  .  0.9 %  sodium chloride infusion,  250 mL, Intravenous, PRN, Lahoma Crocker, MD .  acetaminophen (TYLENOL) tablet 650 mg, 650 mg, Oral, Q6H PRN **OR** acetaminophen (TYLENOL) suppository 650 mg, 650 mg, Rectal, Q6H PRN, Lahoma Crocker, MD .  alteplase (ACTIVASE) 1 mg/mL infusion 51.1 mg, 0.9 mg/kg, Intravenous, Once, Stacy Deshler, Georgiana Spinner R, MD .  amoxicillin (AMOXIL) capsule 500 mg, 500 mg, Oral, Q8H, Lahoma Crocker, MD, 500 mg at 08/31/18 0606 .  aspirin EC tablet 81 mg, 81 mg, Oral, QHS, Lahoma Crocker, MD, 81 mg at 08/30/18 2306 .  calcium-vitamin D (OSCAL  WITH D) 500-200 MG-UNIT per tablet, , Oral, BID, Lahoma Crocker, MD, 1 tablet at 08/30/18 2306 .  doxycycline (VIBRA-TABS) tablet 100 mg, 100 mg, Oral, BID, Lahoma Crocker, MD, 100 mg at 08/30/18 2306 .  enoxaparin (LOVENOX) injection 30 mg, 30 mg, Subcutaneous, Q24H, Pierce, Dwayne A, RPH, 30 mg at 08/30/18 1751 .  ferrous sulfate tablet 325 mg, 325 mg, Oral, BID WC, Lahoma Crocker, MD, 325 mg at 08/30/18 1752 .  insulin aspart (novoLOG) injection 0-9 Units, 0-9 Units, Subcutaneous, TID WC, Lawerance Bach, RPH, 1 Units at 08/30/18 1752 .  lisinopril (PRINIVIL,ZESTRIL) tablet 40 mg, 40 mg, Oral, Daily, Lahoma Crocker, MD, 40 mg at 08/30/18 [phone] .  metoprolol tartrate (LOPRESSOR) tablet 50 mg, 50 mg, Oral, BID, Lahoma Crocker, MD, 50 mg at 08/30/18 2306 .  multivitamin with minerals tablet 1 tablet, 1 tablet, Oral, Daily, Lahoma Crocker, MD, 1 tablet at 08/30/18 [id number] .  nitroGLYCERIN (NITROSTAT) SL tablet 0.4 mg, 0.4 mg, Sublingual, Q5 min PRN, Bodenheimer, Charles A, NP, 0.4 mg at 08/31/18 0541 .  ondansetron (ZOFRAN) tablet 4 mg, 4 mg, Oral, Q6H PRN **OR** ondansetron (ZOFRAN) injection 4 mg, 4 mg, Intravenous, Q6H PRN, Lahoma Crocker, MD, 4 mg at 08/31/18 0644 .  oxyCODONE (Oxy IR/ROXICODONE) immediate release tablet 5 mg, 5 mg, Oral, Q4H PRN, Lahoma Crocker, MD .  polyethylene glycol (MIRALAX / GLYCOLAX) packet 17 g, 17 g, Oral, Daily PRN, Lahoma Crocker, MD .  pravastatin (PRAVACHOL) tablet 40 mg, 40 mg, Oral, Daily, Lahoma Crocker, MD, 40 mg at 08/30/18 0839 .  senna-docusate (Senokot-S) tablet 1 tablet, 1 tablet, Oral, QHS, Lahoma Crocker, MD, 1 tablet at 08/30/18 2306 .  sodium chloride flush (NS) 0.9 % injection 3 mL, 3 mL, Intravenous, Q12H, Lahoma Crocker, MD, 3 mL at 08/30/18 2307 .  sodium chloride flush (NS) 0.9 % injection 3 mL, 3 mL, Intravenous, PRN, Lahoma Crocker, MD .  tetrahydrozoline 0.05 % ophthalmic solution 1 drop, 1 drop,  Both Eyes, BID, Willette Pa, Henderson Newcomer, MD, 1 drop at 08/30/18 2306 .  vitamin B-12 (CYANOCOBALAMIN) tablet 1,000 mcg, 1,000 mcg, Oral, Daily, Lahoma Crocker, MD, 1,000 mcg at 08/30/18 4132  Exam: Current vital signs: BP (!) 145/49 (BP Location: Right Arm)   Pulse 74   Temp 98.1 F (36.7 C) (Oral)   Resp 18   Ht 5' (1.524 m)   Wt 56.8 kg   SpO2 97%   BMI 24.46 kg/m  Vital signs in last 24 hours: Temp:  [98.1 F (36.7 C)-98.9 F (37.2 C)] 98.1 F (36.7 C) (04/06 0449) Pulse Rate:  [47-83] 74 (04/06 0720) Resp:  [18-20] 18 (04/06 0449) BP: (98-208)/(41-74) 145/49 (04/06 0720) SpO2:  [96 %-97 %] 97 % (04/06 0449)  Physical Exam  Constitutional: Appears well-developed. Eyes: No scleral injection HENT: No OP obstrucion Head: Normocephalic.  Cardiovascular: Normal rate  and regular rhythm.  Respiratory: Effort normal, non-labored breathing GI: Soft.  No distension. There is no tenderness.   Neuro: Mental Status: Patient is awake, but not responding to commands, Cranial Nerves: II: Visual Fields : Does not blink to threat on the right side.  III,IV, VI: EOMI without ptosis or diploplia.  V: Facial sensation is symmetric to temperature VII: Mild right facial droop VIII: hearing is intact to voice X: Uvula elevates symmetrically XI: Shoulder shrug is symmetric. XII: tongue is midline without atrophy or fasciculations.  Motor: 0/5 strength in the right upper extremity.  3/5 strength in the right lower extremity.  4/5 strength in left upper and lower extremities. Sensory: Difficult to assess. Deep Tendon Reflexes: 2+ and symmetric in the biceps and patellae.  Plantars: Toes are downgoing bilaterally.  Cerebellar: FNF and HKS are intact bilaterally  NIHSS- 21   Labs I have reviewed labs in epic and the results pertinent to this consultation are:  CBC    Component Value Date/Time   WBC 7.5 08/30/2018 0624   RBC 2.78 (L) 08/30/2018 0624   HGB 9.2 (L) 08/30/2018  0624   HCT 28.3 (L) 08/30/2018 0624   PLT 219 08/30/2018 0624   MCV 101.8 (H) 08/30/2018 0624   MCH 33.1 08/30/2018 0624   MCHC 32.5 08/30/2018 0624   RDW 11.9 08/30/2018 0624   LYMPHSABS 1.7 08/29/2018 1411   MONOABS 1.1 (H) 08/29/2018 1411   EOSABS 0.0 08/29/2018 1411   BASOSABS 0.0 08/29/2018 1411    CMP     Component Value Date/Time   NA 140 08/30/2018 0624   K 3.9 08/30/2018 0624   CL 109 08/30/2018 0624   CO2 22 08/30/2018 0624   GLUCOSE 147 (H) 08/30/2018 0624   BUN 16 08/30/2018 0624   CREATININE 1.07 (H) 08/30/2018 0624   CALCIUM 9.2 08/30/2018 0624   PROT 7.3 08/25/2018 1737   ALBUMIN 3.9 08/25/2018 1737   AST 29 08/25/2018 1737   ALT 23 08/25/2018 1737   ALKPHOS 40 08/25/2018 1737   BILITOT 0.9 08/25/2018 1737   GFRNONAA 47 (L) 08/30/2018 0624   GFRAA 54 (L) 08/30/2018 0624    Lipid Panel     Component Value Date/Time   CHOL 165 07/20/2018 0932   TRIG 137.0 07/20/2018 0932   HDL 45.90 07/20/2018 0932   CHOLHDL 4 07/20/2018 0932   VLDL 27.4 07/20/2018 0932   LDLCALC 92 07/20/2018 0932   LDLDIRECT 156.0 03/13/2009 0000     Imaging I have reviewed the images obtained:  CT-scan of the brain with acute on chronic left cerebral infarct, no acute hemorrhage.   Assessment:    RAMISA DUMAN is a 85 y.o. female with PMHx significant for CAD S/P CABG, PAD, HFrEF, paroxysmal A. Fib. Not on anticoagulation, AVMs, and multiple areas of small frontal infarcts on MRI was admitted on 08/29/18 due to confusion and worsening mental status.  Treated for UTI and cellulitis. Developed acute on chronic left cerebral infarction resulted in altered mental status and right-sided weakness.  Impression: Acute left cerebral infarct.  CTA without any emergent large vessel occlusion.  Severe stenosis of left ICA and there is an acute subsegmental PE was noted in right upper lobe.  Recommendations: -TPA was given as patient was within the window. -Transfer to neuro  ICU. -Strict bedrest. -Complete stroke work-up-MRI and echo was ordered. -A1c and lipid panel-ordered. -PT/OT evaluation. -Swallow evaluation.  Chest pain/right upper lobe PE. initial EKG and troponin is negative. PE can also  cause chest pain. -Trending troponin. -Already received TPA. -Will need anticoagulatiom post 24hrs from tPA  Hypertension.  Continue home meds. -Keep blood pressure below 140-labetalol can be used as as needed.  UTI and lower extremity cellulitis.  Urine culture grew Aerococcus urinae. -Continue antibiotics.  Plan is to complete 10-day course of doxycycline and a 5-day course of amoxicillin.Day 3.  Diabetes.  Managed with SSI in hospital.  Paroxysmal A. Fib.  Currently in sinus rhythm, not sure why she is not on any anticoagulation.  Arnetha Courser MD PGY3 Pager [phone] 08/31/2018, 8:45 AM     NEUROHOSPITALIST ADDENDUM Performed a face to face diagnostic evaluation.   I have reviewed the contents of history and physical exam as documented by PA/ARNP/Resident and agree with above documentation.  I have discussed and formulated the above plan as documented. Edits to the note have been made as needed.  85 year old female with past medical history of coronary artery disease status post CABG, hypertension, A. fib not on anticoagulation admitted for urinary tract infection and cellulitis was stroke alerted 730 this morning.  Last seen normal was 5 AM and patient complained of chest pain, was evaluated by rapid response team and received nitroglycerin and morphine and went to sleep.  When later seen by the nurse, is aphasic and not moving her right leg. On assessment, patient had right hemiparesis, visual neglect and expressive and receptive aphasia.  She was not following any commands.  NIH stroke scale was 21.  CT head showed no acute findings.  Patient received IV TPA after discussing risk versus benefit with her son. CT angiogram was performed which showed no LVO,  however did show significant left ICA stenosis.  Patient admitted to neuro ICU for close observation.  Will schedule patient for diagnostic angiogram tomorrow if GFR greater than 45.  Also noted to have incidental PE, may need anticoagulation after post TPA from TPA.   This patient is neurologically critically ill due to left MCA stroke s/p tPA.   He is at risk for significant risk of neurological worsening from cerebral edema,  death from brain herniation, heart failure, hemorrhagic conversion, infection, respiratory failure and seizure. This patient's care requires constant monitoring of vital signs, hemodynamics, respiratory and cardiac monitoring, review of multiple databases, neurological assessment, discussion with family, other specialists and medical decision making of high complexity.  I spent 55 minutes of neurocritical time in the care of this patient.       Georgiana Spinner Bently Morath MD [facility] [phone]   If 7pm to 7am, please call on call as listed on AMION.

## 2018-08-31 NOTE — Progress Notes (Signed)
Patient was alert and oriented x 2-3 at start of shift, she was able to follow commands and simple motor assessment.  She was able to verbalize needs.  As shift progressed she slept until 0430 when she was awaken for a bath, NT informed that blood pressure was severely HTN.  Provided 10mg  IV Hydralazine.  Patient noted yelling out within 30 minutes of administration, once in room she complained of chest pain 10/10 non radiating.  Rapid called and Hospitalist notified, orders for nitro and morphine (please see mar).  She then verbalized relief and went to sleep, blood pressure 111/46.  One hour later active emesis noted, anti emetic provided and blood pressure began to elevate 157/53.  At shift change patient alert but non responsive with communication as she had been throughout the shift.  Neuro assessment noted right facial droop, rigid bilateral lower extremities right worse than left and stiff straight right upper extremity.  Rapid called and physician paged, code stroke called.  Patient currently off unit with rapid response nurse for imaging.

## 2018-08-31 NOTE — Telephone Encounter (Signed)
Copied from CRM [phone]. Topic: General - Other >> Aug 31, 2018  2:14 PM Jaquita Rector A wrote: Reason for CRM: Patient son called to say that she had a stroke and is in the hospital so will not be able to do visit for 09/01/2018.

## 2018-08-31 NOTE — Progress Notes (Signed)
Patient Demographics:    Emily Daniel, is a 85 y.o. female, DOB - [birth year], QIO:[id number]  Admit date - 08/29/2018   Admitting Physician Lahoma Crocker, MD  Outpatient Primary MD for the patient is Zola Button, Grayling Congress, DO  LOS - 0   Chief Complaint  Patient presents with   Altered Mental Status   Leg Swelling        Subjective:    Emily Daniel today has no fevers,   patient had episode of elevated BP, chest pains overnight around 5:30 AM, then around 7 AM she developed right-sided weakness and speech disturbance, rapid response and code stroke was called this morning after 7 AM, work-up reveals acute stroke  Assessment  & Plan :    Principal Problem:   Cellulitis of left leg without foot Active Problems:   DM (diabetes mellitus) type II uncontrolled, periph vascular disorder (HCC)   Hyperlipidemia   Anxiety state   Essential hypertension   Atrial fibrillation (HCC)   Acute lower UTI   Acute metabolic encephalopathy   Acute ischemic left MCA stroke [facility])   Brief Summary 85 y.o. female with medical history significant ofanxiety, CAD status post CABG, peripheral artery disease, HFrEF, DM2,, paroxysmal atrial fibrillation not on anticoagulation, AVMs,hyperlipidemia,and prior tobacco abuse, admission MRI on 08/29/2018 was without acute findings but did show multiple areas of old small frontal infarctions, pt was admitted on 08/29/2018 with concerns about confusion in the setting of UTI and left lower extremity cellulitis. patient had episode of elevated BP, chest pains  around 5:30 AM on 08/31/2018, then around 7 AM she developed strokelike symptoms including right-sided weakness and speech disturbance, rapid response and code stroke was called this morning after 7 AM, work-up reveals acute stroke,  patient is status post TPA on 08/31/2018, Patient will need cerebral angiogram due to more than 90%  stenosis of left ICA     Plan:- 1)Acute Lt cerebral CVA--- status post TPA on 08/31/2018, transferred to neurology service, patient transferred to neuro ICU, discussed with Dr. Laurence Slate.... Plan is for cerebral angiogram on 09/01/2018 due to more than 90% stenosis of the left ICA, otherwise no emergent large vessel occlusion on CTA head and neck--- further management including BP control and decision on antiplatelet/anticoagulation choices as per neurology team ... Fasting lipid profile pending, A1c was 5.7.Marland KitchenMarland Kitchen  # Telemetry monitoring # Frequent neuro checks # NPO until passes stroke swallow screen  2)Chest Pain/Acute RUL PE--- venous Dopplers on admission without acute DVT, patient received TPA for acute stroke on 08/31/2018, defer to neurology service as to when it is safe to start full anticoagulation for acute PE... Echocardiogram to evaluate for right heart strain and possible intracardiac thrombus  3)HTN--- switch metoprolol to IV, BP parameters and BP control as per neurology team... Avoid abrupt discontinuation of beta-blockers if possible given underlying CAD,  PTA was on lisinopril 40 mg daily and metoprolol 50 mg twice daily... Again refusing a BP meds per neurology team  4)Paroxysmal atrial fibrillation--- currently in sinus rhythm, PTA on aspirin but no full anticoagulation,  Patient has a history of atrial fibrillation but currently is in a sinus rhythm.    Admitting provider had long discussion with the patient's son about why she is not  on anticoagulation.  We have no records from her cardiologist , patient and son were  not eager to start anticoagulation, use metoprolol for rate control (BP parameters per neurology team).... Patient will NOW need for anticoagulation anyway due to acute PE  5) Cellulitis of left leg -left lower extremity cellulitis appears to be improving  (less edema, less redness, no significant warmth ) previously on p.o. doxycycline,  Amoxicillin added mostly for UTI on  08/29/2018, lower extremity venous Dopplers negative for acute DVT.... Given n.p.o. status okay to switch to IV Keflex and IV doxycycline last dose 09/02/2018  6) Acute lower UTI: Patient with Aerococcus urinae UTI.  A fairly rare organism that is treated with amoxicillin.    ED practitioner discussed the case with ID who recommended this treatment-treat empirically with amoxicillin 500 3 times daily as recommended by ID physician, however patient is now n.p.o. due to acute stroke, antibiotics as above in #5   7) Diabetes mellitus type 2 uncontrolled with peripheral vascular disorder:--A1c is 5.7, reflecting excellent diabetic control--- hold Januvia, sliding scale insulin coverage with Accu-Cheks for now    8) Hyperlipidemia: PTA she was on pravastatin 40 mg daily, she will most likely need high-dose statins given acute stroke, fasting lipid profile pending  Disposition/Need for in-Hospital Stay- patient unable to be discharged at this time due to acute left cerebral stroke requiring IV TPA administration.... Patient will need cerebral angiogram due to more than 90% stenosis of left ICA  Code Status : Full  Family Communication:   Discussed with patient's son by phone   Disposition Plan  : TBD  Consults  : EP discussed case with ID physician/transferred to neurology service on 08/31/2018  DVT Prophylaxis  : TPA on 08/31/2018  Lab Results  Component Value Date   PLT 219 08/30/2018    Inpatient Medications  Scheduled Meds:  calcium-vitamin D   Oral BID   insulin aspart  0-6 Units Subcutaneous Q6H   metoprolol tartrate  5 mg Intravenous Q8H   pravastatin  40 mg Oral Daily   sodium chloride flush  3 mL Intravenous Q12H   tetrahydrozoline  1 drop Both Eyes BID   Continuous Infusions:  sodium chloride 50 mL (08/31/18 0845)   sodium chloride 75 mL/hr at 08/31/18 1010    ceFAZolin (ANCEF) IV     doxycycline (VIBRAMYCIN) IV     PRN Meds:.sodium chloride, nitroGLYCERIN,  [DISCONTINUED] ondansetron **OR** ondansetron (ZOFRAN) IV, sodium chloride flush    Anti-infectives (From admission, onward)   Start     Dose/Rate Route Frequency Ordered Stop   08/31/18 1400  ceFAZolin (ANCEF) IVPB 1 g/50 mL premix     1 g 100 mL/hr over 30 Minutes Intravenous Every 8 hours 08/31/18 1051 09/03/18 1359   08/31/18 1100  doxycycline (VIBRAMYCIN) 100 mg in sodium chloride 0.9 % 250 mL IVPB     100 mg 125 mL/hr over 120 Minutes Intravenous Every 12 hours 08/31/18 1051 09/03/18 1059   08/30/18 0000  doxycycline (VIBRA-TABS) 100 MG tablet     100 mg Oral 2 times daily 08/30/18 1242 09/04/18 2359   08/30/18 0000  amoxicillin (AMOXIL) 500 MG capsule     500 mg Oral 3 times daily 08/30/18 1242 09/04/18 2359   08/29/18 2200  doxycycline (VIBRA-TABS) tablet 100 mg  Status:  Discontinued     100 mg Oral 2 times daily 08/29/18 1736 08/31/18 1049   08/29/18 2200  amoxicillin (AMOXIL) capsule 500 mg  Status:  Discontinued  500 mg Oral Every 8 hours 08/29/18 1736 08/31/18 1049        Objective:   Vitals:   08/31/18 0945 08/31/18 1000 08/31/18 1015 08/31/18 1030  BP: (!) 165/60 (!) 167/62 (!) 166/55 (!) 162/58  Pulse: 80 80 73 77  Resp: 17 15 17  (!) 22  Temp:      TempSrc:      SpO2: 97% 95% 97% 99%  Weight:      Height:        Wt Readings from Last 3 Encounters:  08/29/18 56.8 kg  07/28/18 58.5 kg  04/07/18 58.2 kg     Intake/Output Summary (Last 24 hours) at 08/31/2018 1111 Last data filed at 08/31/2018 1100 Gross per 24 hour  Intake 75 ml  Output --  Net 75 ml     Physical Exam Patient is examined daily including today on 08/31/2018 , exams remain the same as of yesterday except that has changed   Gen:- Awake Alert, in no acute distress, speech difficulties noted HEENT:- [place].AT, No sclera icterus, facial droop noted, dried blood on tongue and oral cavity  neck-Supple Neck,No JVD,.   Lungs-  CTAB , fair symmetrical air movement, CABG CV- S1, S2 normal,  regular (history of atrial fibrillation but currently regular) Abd-  +ve B.Sounds, Abd Soft, No tenderness,    Extremity/Skin:- No  edema, pedal pulses present  NeuroPsych-  Neurologial Exam: Mental Status:  Patient is awake, alert, oriented to person,   Pt has expressive Aphasia  Cranial Nerves: II: Visual Fields are full. Pupils are equal, round, and reactive to light.   III,IV, VI: EOMI without ptosis or diploplia.  V: Facial sensation is  diminished on the right  VII: Facial movement is asymmetric--droop noted VIII: hearing is intact to voice  Motor: Tone is normal. Bulk is normal. 0/5 strength in right upper extremity, 1/5 right lower extremity, 5/5 on the left   Sensory: Sensation is  diminished on the right to pinprick and to touch Cerebellar: FNF  not tested      Data Review:   Micro Results Recent Results (from the past 240 hour(s))  Culture, Urine     Status: Abnormal   Collection Time: 08/25/18  7:51 PM  Result Value Ref Range Status   Specimen Description   Final    URINE, CLEAN CATCH Performed at [facility], [address] [place], [place] [address]    Special Requests   Final    NONE Performed at [facility], [address], [place], [place] [address]    Culture (A)  Final    >=100,000 COLONIES/mL AEROCOCCUS URINAE Standardized susceptibility testing for this organism is not available. Performed at [facility], [address], [place], [place] [address]    Report Status 08/28/2018 FINAL  Final  Urine culture     Status: Abnormal   Collection Time: 08/29/18  3:30 PM  Result Value Ref Range Status   Specimen Description URINE, RANDOM  Final   Special Requests   Final    NONE Performed at [facility], [address], [place], [place] [address]    Culture 20,000 COLONIES/mL ENTEROCOCCUS FAECALIS (A)  Final   Report Status 08/31/2018 FINAL  Final   Organism ID, Bacteria ENTEROCOCCUS FAECALIS (A)   Final      Susceptibility   Enterococcus faecalis - MIC*    AMPICILLIN <=2 SENSITIVE Sensitive     LEVOFLOXACIN >=8 RESISTANT Resistant  NITROFURANTOIN <=16 SENSITIVE Sensitive     VANCOMYCIN 1 SENSITIVE Sensitive     * 20,000 COLONIES/mL ENTEROCOCCUS FAECALIS  MRSA PCR Screening     Status: None   Collection Time: 08/31/18  8:59 AM  Result Value Ref Range Status   MRSA by PCR NEGATIVE NEGATIVE Final    Comment:        The GeneXpert MRSA Assay (FDA approved for NASAL specimens only), is one component of a comprehensive MRSA colonization surveillance program. It is not intended to diagnose MRSA infection nor to guide or monitor treatment for MRSA infections. Performed at [facility], [address] [place], [place] [address]     Radiology Reports Ct Angio Head W Or Wo Contrast  Result Date: 08/31/2018 CLINICAL DATA:  Focal neuro deficit with stroke suspected.  Aphasia EXAM: CT ANGIOGRAPHY HEAD AND NECK TECHNIQUE: Multidetector CT imaging of the head and neck was performed using the standard protocol during bolus administration of intravenous contrast. Multiplanar CT image reconstructions and MIPs were obtained to evaluate the vascular anatomy. Carotid stenosis measurements (when applicable) are obtained utilizing NASCET criteria, using the distal internal carotid diameter as the denominator. CONTRAST:  75mL OMNIPAQUE IOHEXOL 350 MG/ML SOLN COMPARISON:  Head CT earlier today FINDINGS: CTA NECK FINDINGS Aortic arch: Atherosclerotic plaque. Partially visualized changes of CABG. 2 vessel branching. Right carotid system: Mild calcified plaque at the bifurcation without flow limiting stenosis or ulceration. Left carotid system: Bulky calcified plaque at the ICA bulb with severe stenosis due to irregular luminal plaque, pre occlusive in severity. The narrowing is so small that measurement may be misleading. There is symmetric downstream flow. Vertebral arteries: No proximal subclavian  flow limiting stenosis. Dominant left vertebral artery. Skeleton: Degenerative changes without acute finding Other neck: Negative Upper chest: Central branching subsegmental pulmonary embolism in the right upper lobe, acute appearing Review of the MIP images confirms the above findings CTA HEAD FINDINGS Anterior circulation: Plaque on the carotid siphons. No emergent large vessel occlusion. Aplastic right A1 segment. Posterior circulation: Left dominant vertebral artery with calcified plaque on the V4 segment. The vertebral and basilar arteries are smooth and widely patent. Negative for aneurysm Venous sinuses: Patent Anatomic variants: As above Delayed phase: Not obtained in the emergent setting Critical Value/emergent results were called by telephone at the time of interpretation on 08/31/2018 at 8:29 am to Dr. Arther Dames , who verbally acknowledged these results. Review of the MIP images confirms the above findings IMPRESSION: 1. No emergent large vessel occlusion. 2. Severe, pre occlusive atheromatous stenosis at the left ICA origin. This is presumably the source of left MCA territory infarcts on prior head CT. 3. Acute subsegmental pulmonary embolism in the right upper lobe. Electronically Signed   By: Marnee Spring M.D.   On: 08/31/2018 08:30   Ct Angio Neck W Or Wo Contrast  Result Date: 08/31/2018 CLINICAL DATA:  Focal neuro deficit with stroke suspected.  Aphasia EXAM: CT ANGIOGRAPHY HEAD AND NECK TECHNIQUE: Multidetector CT imaging of the head and neck was performed using the standard protocol during bolus administration of intravenous contrast. Multiplanar CT image reconstructions and MIPs were obtained to evaluate the vascular anatomy. Carotid stenosis measurements (when applicable) are obtained utilizing NASCET criteria, using the distal internal carotid diameter as the denominator. CONTRAST:  75mL OMNIPAQUE IOHEXOL 350 MG/ML SOLN COMPARISON:  Head CT earlier today FINDINGS: CTA NECK FINDINGS  Aortic arch: Atherosclerotic plaque. Partially visualized changes of CABG. 2 vessel branching. Right carotid system: Mild calcified plaque  at the bifurcation without flow limiting stenosis or ulceration. Left carotid system: Bulky calcified plaque at the ICA bulb with severe stenosis due to irregular luminal plaque, pre occlusive in severity. The narrowing is so small that measurement may be misleading. There is symmetric downstream flow. Vertebral arteries: No proximal subclavian flow limiting stenosis. Dominant left vertebral artery. Skeleton: Degenerative changes without acute finding Other neck: Negative Upper chest: Central branching subsegmental pulmonary embolism in the right upper lobe, acute appearing Review of the MIP images confirms the above findings CTA HEAD FINDINGS Anterior circulation: Plaque on the carotid siphons. No emergent large vessel occlusion. Aplastic right A1 segment. Posterior circulation: Left dominant vertebral artery with calcified plaque on the V4 segment. The vertebral and basilar arteries are smooth and widely patent. Negative for aneurysm Venous sinuses: Patent Anatomic variants: As above Delayed phase: Not obtained in the emergent setting Critical Value/emergent results were called by telephone at the time of interpretation on 08/31/2018 at 8:29 am to Dr. Arther Dames , who verbally acknowledged these results. Review of the MIP images confirms the above findings IMPRESSION: 1. No emergent large vessel occlusion. 2. Severe, pre occlusive atheromatous stenosis at the left ICA origin. This is presumably the source of left MCA territory infarcts on prior head CT. 3. Acute subsegmental pulmonary embolism in the right upper lobe. Electronically Signed   By: Marnee Spring M.D.   On: 08/31/2018 08:30   Mr Brain Wo Contrast  Result Date: 08/25/2018 CLINICAL DATA:  Transient slurred speech this afternoon. History of tick bite, AVM, diabetes, hypertension and hyperlipidemia. EXAM: MRI  HEAD WITHOUT CONTRAST TECHNIQUE: Multiplanar, multiecho pulse sequences of the brain and surrounding structures were obtained without intravenous contrast. COMPARISON:  None. FINDINGS: Mild motion degraded examination. INTRACRANIAL CONTENTS: No reduced diffusion to suggest acute ischemia a few scattered chronic microhemorrhages noted. No parenchymal brain volume loss for age. No hydrocephalus. Old bilateral small cerebellar infarcts. Old LEFT thalamus and possible basal ganglia lacunar infarcts. Confluent supratentorial and patchy pontine white matter FLAIR T2 hyperintensities. Prominent basal ganglia perivascular spaces associated with chronic small vessel ischemic changes. Small bilateral frontal convexity encephalomalacia versus motion artifact. No suspicious parenchymal signal, masses, mass effect. No abnormal extra-axial fluid collections. No extra-axial masses. VASCULAR: Normal major intracranial vascular flow voids present at skull base. SKULL AND UPPER CERVICAL SPINE: No abnormal sellar expansion. No suspicious calvarial bone marrow signal. Craniocervical junction maintained. SINUSES/ORBITS: Included paranasal sinuses are well aerated. Minimal mastoid effusions. Included ocular globes and orbital contents are non-suspicious. Status post bilateral ocular lens implants. OTHER: None. IMPRESSION: 1. Mild motion degraded examination.  No acute intracranial process. 2. Moderate to severe chronic small vessel ischemic changes. Old cerebellar and deep gray nuclei infarcts. Old small frontal infarcts versus motion artifact. Electronically Signed   By: Awilda Metro M.D.   On: 08/25/2018 19:35   Dg Chest Port 1 View  Result Date: 08/31/2018 CLINICAL DATA:  Stroke with oral bleeding.  Question aspiration EXAM: PORTABLE CHEST 1 VIEW COMPARISON:  12/14/2013 FINDINGS: Interstitial coarsening without pulmonary edema or airspace disease in the apices on recent chest CTA. There is likely a vascular congestion.  Cardiomegaly with mitral valve replacement and CABG. No effusion or pneumothorax. IMPRESSION: Cardiomegaly and vascular congestion. Electronically Signed   By: Marnee Spring M.D.   On: 08/31/2018 09:36   Ir Patient Eval Tech 0-60 Mins  Result Date: 08/31/2018 [place] Pastel     08/31/2018  9:08 AM Paged out as code stroke for IR intervention, Neurologist Aroor  cancelled after tray and supplies were opened.  Ct Head Code Stroke Wo Contrast  Result Date: 08/31/2018 CLINICAL DATA:  Code stroke.  Aphasia. EXAM: CT HEAD WITHOUT CONTRAST TECHNIQUE: Contiguous axial images were obtained from the base of the skull through the vertex without intravenous contrast. COMPARISON:  Brain MRI 08/25/2018 FINDINGS: Brain: Cortical low-density along the high left frontal parietal convexity, increased in extent from prior brain MRI. No acute hemorrhage, hydrocephalus, or collection. Age congruent cerebral volume loss. Confluent chronic small vessel ischemic change in the cerebral white matter. Small remote bilateral cerebellar infarcts. Vascular: Atherosclerotic calcification.  No hyperdense vessel. Skull: Negative Sinuses/Orbits: Bilateral cataract resection Other: These results were communicated to Dr. Laurence Slate at 7:56 amon 4/6/2020by text page via the [facility] messaging system. ASPECTS [facility] Stroke Program Early CT Score) - Ganglionic level infarction (caudate, lentiform nuclei, internal capsule, insula, M1-M3 cortex): 7 - Supraganglionic infarction (M4-M6 cortex): 1 -2 Total score (0-10 with 10 being normal): 8 to 9 IMPRESSION: 1. Acute on chronic high left cerebral infarcions, ASCPETS is 8 to 9. 2. No acute hemorrhage. Electronically Signed   By: Marnee Spring M.D.   On: 08/31/2018 07:58   Vas [place] Lower Extremity Venous (dvt) (only [facility])  Result Date: 08/30/2018  Lower Venous Study Indications: Edema, and Multiple tick bites.  Comparison Study: No prior study on file for comparison Performing Technologist: Sherren Kerns RVS  Examination Guidelines: A complete evaluation includes B-mode imaging, spectral Doppler, color Doppler, and power Doppler as needed of all accessible portions of each vessel. Bilateral testing is considered an integral part of a complete examination. Limited examinations for reoccurring indications may be performed as noted.  Right Venous Findings: +---+---------------+---------+-----------+----------+-------+      Compressibility Phasicity Spontaneity Properties Summary  +---+---------------+---------+-----------+----------+-------+  CFV Full            Yes       Yes                             +---+---------------+---------+-----------+----------+-------+  Left Venous Findings: +---------+---------------+---------+-----------+----------+-------+            Compressibility Phasicity Spontaneity Properties Summary  +---------+---------------+---------+-----------+----------+-------+  CFV       Full            Yes       Yes                             +---------+---------------+---------+-----------+----------+-------+  SFJ       Full                                                      +---------+---------------+---------+-----------+----------+-------+  FV Prox   Full                                                      +---------+---------------+---------+-----------+----------+-------+  FV Mid    Full                                                      +---------+---------------+---------+-----------+----------+-------+  FV Distal Full                                                      +---------+---------------+---------+-----------+----------+-------+  PFV       Full                                                      +---------+---------------+---------+-----------+----------+-------+  POP       Full            Yes       Yes                             +---------+---------------+---------+-----------+----------+-------+  PTV       Full                                                       +---------+---------------+---------+-----------+----------+-------+  PERO      Full                                                      +---------+---------------+---------+-----------+----------+-------+  GSV       Full                                                      +---------+---------------+---------+-----------+----------+-------+    Summary: Right: No evidence of common femoral vein obstruction. Left: No evidence of common femoral vein obstruction. Ultrasound characteristics of enlarged lymph nodes noted in the groin.  *See table(s) above for measurements and observations. Electronically signed by Sherald Hesshristopher Clark MD on 08/30/2018 at 9:06:31 AM.    Final      CBC Recent Labs  Lab 08/25/18 1737 08/29/18 1411 08/30/18 0624  WBC 9.5 7.8 7.5  HGB 10.3* 9.9* 9.2*  HCT 31.5* 29.2* 28.3*  PLT 167 239 219  MCV 106.4* 102.1* 101.8*  MCH 34.8* 34.6* 33.1  MCHC 32.7 33.9 32.5  RDW 12.2 11.9 11.9  LYMPHSABS 1.7 1.7  --   MONOABS 1.5* 1.1*  --   EOSABS 0.0 0.0  --   BASOSABS 0.0 0.0  --     Chemistries  Recent Labs  Lab 08/25/18 1737 08/29/18 1411 08/30/18 0624  NA 137 141 140  K 3.9 3.7 3.9  CL 104 109 109  CO2 22 21* 22  GLUCOSE 145* 132* 147*  BUN 30* 18 16  CREATININE 1.65* 1.03* 1.07*  CALCIUM 9.0 9.5 9.2  AST 29  --   --   ALT 23  --   --   ALKPHOS 40  --   --   BILITOT 0.9  --   --    ------------------------------------------------------------------------------------------------------------------ No results for  input(s): CHOL, HDL, LDLCALC, TRIG, CHOLHDL, LDLDIRECT in the last 72 hours.  Lab Results  Component Value Date   HGBA1C 5.7 (H) 08/29/2018   ------------------------------------------------------------------------------------------------------------------ Recent Labs    08/30/18 0624  TSH 0.843   ------------------------------------------------------------------------------------------------------------------ No results for input(s): VITAMINB12,  FOLATE, FERRITIN, TIBC, IRON, RETICCTPCT in the last 72 hours.  Coagulation profile Recent Labs  Lab 08/25/18 1737  INR 1.2    No results for input(s): DDIMER in the last 72 hours.  Cardiac Enzymes Recent Labs  Lab 08/31/18 0639  TROPONINI <0.03   ------------------------------------------------------------------------------------------------------------------ No results found for: BNP   Shon Hale M.D on 08/31/2018 at 11:11 AM  Go to [email or web address] - for contact info  [facility] - Office  [phone]

## 2018-08-31 NOTE — Telephone Encounter (Signed)
Spoke with the son this morning who stated that the patient is currently in the hospital and has suffered a possible TIA. He has been advised to call if there is anything we can do for them at this time.

## 2018-09-01 ENCOUNTER — Other Ambulatory Visit: Payer: Self-pay | Admitting: Student

## 2018-09-01 ENCOUNTER — Ambulatory Visit: Payer: Medicare Other | Admitting: Family Medicine

## 2018-09-01 ENCOUNTER — Inpatient Hospital Stay (HOSPITAL_COMMUNITY): Payer: Medicare Other

## 2018-09-01 DIAGNOSIS — I63232 Cerebral infarction due to unspecified occlusion or stenosis of left carotid arteries: Secondary | ICD-10-CM

## 2018-09-01 DIAGNOSIS — I63512 Cerebral infarction due to unspecified occlusion or stenosis of left middle cerebral artery: Secondary | ICD-10-CM

## 2018-09-01 HISTORY — PX: IR ANGIO VERTEBRAL SEL VERTEBRAL UNI L MOD SED: IMG5367

## 2018-09-01 HISTORY — PX: IR ANGIO VERTEBRAL SEL SUBCLAVIAN INNOMINATE UNI R MOD SED: IMG5365

## 2018-09-01 HISTORY — PX: IR ANGIO INTRA EXTRACRAN SEL COM CAROTID INNOMINATE BILAT MOD SED: IMG5360

## 2018-09-01 LAB — LIPID PANEL
Cholesterol: 134 mg/dL (ref 0–200)
HDL: 38 mg/dL — ABNORMAL LOW (ref >40)
LDL Cholesterol: 79 mg/dL (ref 0–99)
Total CHOL/HDL Ratio: 3.5 RATIO
Triglycerides: 85 mg/dL (ref <150)
VLDL: 17 mg/dL (ref 0–40)

## 2018-09-01 LAB — HEMOGLOBIN A1C
Hgb A1c MFr Bld: 5.9 % — ABNORMAL HIGH (ref 4.8–5.6)
Mean Plasma Glucose: 122.63 mg/dL

## 2018-09-01 LAB — GLUCOSE, CAPILLARY
Glucose-Capillary: 102 mg/dL — ABNORMAL HIGH (ref 70–99)
Glucose-Capillary: 104 mg/dL — ABNORMAL HIGH (ref 70–99)
Glucose-Capillary: 123 mg/dL — ABNORMAL HIGH (ref 70–99)
Glucose-Capillary: 130 mg/dL — ABNORMAL HIGH (ref 70–99)
Glucose-Capillary: 161 mg/dL — ABNORMAL HIGH (ref 70–99)

## 2018-09-01 LAB — HEPARIN LEVEL (UNFRACTIONATED): Heparin Unfractionated: 0.18 IU/mL — ABNORMAL LOW (ref 0.30–0.70)

## 2018-09-01 MED ORDER — ASPIRIN 325 MG PO TABS: 325.0000 mg | ORAL_TABLET | Freq: Every day | ORAL | Status: DC

## 2018-09-01 MED ORDER — AMOXICILLIN 250 MG PO CHEW: 500.0000 mg | CHEWABLE_TABLET | Freq: Three times a day (TID) | ORAL | Status: DC

## 2018-09-01 MED ORDER — ASPIRIN EC 81 MG PO TBEC
81.0000 mg | DELAYED_RELEASE_TABLET | Freq: Every day | ORAL | Status: DC
  Administered 2018-09-02: 81 mg via ORAL
  Filled 2018-09-01: qty 1

## 2018-09-01 MED ORDER — VITAMIN B-12 1000 MCG PO TABS
1000.0000 ug | ORAL_TABLET | Freq: Every day | ORAL | Status: DC
  Administered 2018-09-02 – 2018-09-05 (×4): 1000 ug via ORAL
  Filled 2018-09-01 (×6): qty 1

## 2018-09-01 MED ORDER — FENTANYL CITRATE (PF) 100 MCG/2ML IJ SOLN
INTRAMUSCULAR | Status: AC
  Filled 2018-09-01: qty 2

## 2018-09-01 MED ORDER — ADULT MULTIVITAMIN W/MINERALS CH
1.0000 | ORAL_TABLET | Freq: Every day | ORAL | Status: DC
  Administered 2018-09-02 – 2018-09-06 (×5): 1 via ORAL
  Filled 2018-09-01 (×5): qty 1

## 2018-09-01 MED ORDER — ORAL CARE MOUTH RINSE
15.0000 mL | Freq: Two times a day (BID) | OROMUCOSAL | Status: DC
  Administered 2018-09-01 – 2018-09-02 (×4): 15 mL via OROMUCOSAL

## 2018-09-01 MED ORDER — LISINOPRIL 20 MG PO TABS
40.0000 mg | ORAL_TABLET | Freq: Every day | ORAL | Status: DC
  Administered 2018-09-02 – 2018-09-05 (×4): 40 mg via ORAL
  Filled 2018-09-01 (×4): qty 2

## 2018-09-01 MED ORDER — METOPROLOL TARTRATE 50 MG PO TABS
50.0000 mg | ORAL_TABLET | Freq: Two times a day (BID) | ORAL | Status: DC
  Administered 2018-09-01 – 2018-09-05 (×9): 50 mg via ORAL
  Filled 2018-09-01 (×10): qty 1

## 2018-09-01 MED ORDER — CHLORHEXIDINE GLUCONATE 0.12 % MT SOLN
15.0000 mL | Freq: Two times a day (BID) | OROMUCOSAL | Status: DC
  Administered 2018-09-01 – 2018-09-03 (×5): 15 mL via OROMUCOSAL
  Filled 2018-09-01 (×2): qty 15

## 2018-09-01 MED ORDER — MIDAZOLAM HCL 2 MG/2ML IJ SOLN
INTRAMUSCULAR | Status: AC | PRN
  Administered 2018-09-01: 1 mg via INTRAVENOUS

## 2018-09-01 MED ORDER — TICAGRELOR 90 MG PO TABS
90.0000 mg | ORAL_TABLET | Freq: Two times a day (BID) | ORAL | Status: DC
  Administered 2018-09-01 – 2018-09-02 (×3): 90 mg via ORAL
  Filled 2018-09-01 (×3): qty 1

## 2018-09-01 MED ORDER — TICAGRELOR 90 MG PO TABS
180.0000 mg | ORAL_TABLET | Freq: Once | ORAL | Status: AC
  Administered 2018-09-01: 180 mg via ORAL
  Filled 2018-09-01: qty 2

## 2018-09-01 MED ORDER — HEPARIN SODIUM (PORCINE) 1000 UNIT/ML IJ SOLN
INTRAMUSCULAR | Status: AC
  Filled 2018-09-01: qty 1

## 2018-09-01 MED ORDER — AMOXICILLIN 250 MG PO CHEW
500.0000 mg | CHEWABLE_TABLET | Freq: Three times a day (TID) | ORAL | Status: DC
  Filled 2018-09-01: qty 2

## 2018-09-01 MED ORDER — LIDOCAINE HCL 1 % IJ SOLN
INTRAMUSCULAR | Status: AC
  Filled 2018-09-01: qty 20

## 2018-09-01 MED ORDER — HEPARIN (PORCINE) 25000 UT/250ML-% IV SOLN
750.0000 [IU]/h | INTRAVENOUS | Status: DC
  Administered 2018-09-01 (×2): 650 [IU]/h via INTRAVENOUS
  Administered 2018-09-02: 750 [IU]/h via INTRAVENOUS
  Filled 2018-09-01 (×2): qty 250

## 2018-09-01 MED ORDER — HEPARIN SODIUM (PORCINE) 1000 UNIT/ML IJ SOLN
INTRAMUSCULAR | Status: AC | PRN
  Administered 2018-09-01: 1000 [IU] via INTRAVENOUS

## 2018-09-01 MED ORDER — SODIUM CHLORIDE 0.9 % IV SOLN
INTRAVENOUS | Status: DC
  Administered 2018-09-01: 13:00:00 via INTRAVENOUS

## 2018-09-01 MED ORDER — IOHEXOL 300 MG/ML  SOLN: 75.0000 mL | Freq: Once | INTRAMUSCULAR | Status: DC | PRN

## 2018-09-01 MED ORDER — LIDOCAINE HCL 1 % IJ SOLN
INTRAMUSCULAR | Status: AC | PRN
  Administered 2018-09-01: 10 mL

## 2018-09-01 MED ORDER — MIDAZOLAM HCL 2 MG/2ML IJ SOLN
INTRAMUSCULAR | Status: AC
  Filled 2018-09-01: qty 2

## 2018-09-01 MED ORDER — FERROUS SULFATE 325 (65 FE) MG PO TABS
325.0000 mg | ORAL_TABLET | Freq: Two times a day (BID) | ORAL | Status: DC
  Administered 2018-09-02 – 2018-09-05 (×7): 325 mg via ORAL
  Filled 2018-09-01 (×9): qty 1

## 2018-09-01 MED ORDER — AMOXICILLIN 500 MG PO CAPS
500.0000 mg | ORAL_CAPSULE | Freq: Three times a day (TID) | ORAL | Status: AC
  Administered 2018-09-01 – 2018-09-03 (×5): 500 mg via ORAL
  Filled 2018-09-01 (×7): qty 1

## 2018-09-01 MED ORDER — DOXYCYCLINE HYCLATE 100 MG PO TABS
100.0000 mg | ORAL_TABLET | Freq: Two times a day (BID) | ORAL | Status: AC
  Administered 2018-09-01 – 2018-09-03 (×4): 100 mg via ORAL
  Filled 2018-09-01 (×5): qty 1

## 2018-09-01 MED ORDER — FENTANYL CITRATE (PF) 100 MCG/2ML IJ SOLN
INTRAMUSCULAR | Status: AC | PRN
  Administered 2018-09-01: 25 ug via INTRAVENOUS

## 2018-09-01 MED ORDER — ASPIRIN 300 MG RE SUPP
300.0000 mg | Freq: Once | RECTAL | Status: AC
  Administered 2018-09-01: 300 mg via RECTAL
  Filled 2018-09-01: qty 1

## 2018-09-01 NOTE — Progress Notes (Signed)
  PT Cancellation Note  Patient Details Name: Emily Daniel MRN: [id number] DOB: 1930-01-18   Cancelled Treatment:    Reason Eval/Treat Not Completed: Patient at procedure or test/unavailable(pt in IR and unavailable)   Celestino Ackerman B Aricka Goldberger 09/01/2018, 10:23 AM Delaney Meigs, PT Acute Rehabilitation Services Pager: [phone] Office: [phone]

## 2018-09-01 NOTE — Consult Note (Signed)
Chief Complaint: Patient was seen in consultation today for CVA/diagnostic cerebral arteriogram.  Referring Physician(s): Marvel Plan  Supervising Physician: Julieanne Cotton  Patient Status: [facility] - In-pt  History of Present Illness: Emily Daniel is a 85 y.o. female with a past medical history of hypertension, hyperlipidemia, ischemic cardiomyopathy, mitral regurgitation, HF, CAD, PVD, intermittent atrial fibrillation not on chronic anticoagulation, CVA 08/2018, hemorrhoids, diabetes mellitus type II, vitamin B12 deficiency, anxiety, and cataracts. She was diagnosed with a tick borne illness on 08/25/2018. Her PCP prescribed Doxycycline for management. She then presented to [facility] ED 08/29/2018 with complaint of AMS and lower extremity swelling. She was found to have cellulitis of left leg and acute lower UTI with associated acute metabolic encephalopathy, and was admitted for further management. On 08/31/2018, patient developed aphasia and was unable to move right side. Code stroke was initiated, and patient was found to have an acute left cerebral infarct without any emergent large vessel occlusion. Neurology was consulted and TPA was given.  CT head 08/31/2018: 1. Acute on chronic high left cerebral infarcions, ASCPETS is 8 to 9. 2. No acute hemorrhage.  CTA head/neck 08/31/2018: 1. No emergent large vessel occlusion. 2. Severe, pre occlusive atheromatous stenosis at the left ICA origin. This is presumably the source of left MCA territory infarcts on prior head CT. 3. Acute subsegmental pulmonary embolism in the right upper lobe.  MR brain 09/01/2018: 1. Moderate area of acute infarction along the left more than right high cerebral convexity, likely watershed. Mild petechial hemorrhage on the left. 2. Background of extensive chronic small vessel ischemia in the cerebral white matter. 3. Scattered remote micro hemorrhages suspicious for amyloid angiopathy.  IR requested by Dr. Roda Shutters for possible  image-guided diagnostic cerebral arteriogram to evaluate possible causes of acute CVA. Patient awake and alert laying in bed. History difficult to obtain due to AMS.  Patient is currently receiving Heparin IV continuous infusion.   Past Medical History:  Diagnosis Date   Anxiety    Atrial fibrillation (HCC)    post op >>> req'd Amio and Coumadin   AVM (arteriovenous malformation)    CAD (coronary artery disease)    a. NSTEMI 10/2013 >>> LHC (10/2013):  Distal left main 90%, proximal LAD 30%, mid LAD 20%, ostial circumflex 99%, then 80%, mid RCA 99%, EF 30-35%, 3-4+ MR; proximal right iliac occluded, right renal 50%, ostial left iliac 70% >>> CABG/MV repair   Cataract    CHF (congestive heart failure) (HCC)    Dr. Dorris Fetch following   Chronic systolic heart failure (HCC)    Diabetes mellitus, type 2 (HCC)    Hemorrhoids    Hx of echocardiogram    Echo (9/15):  Mild LVH, EF 40-45%, inf AK, Gr 1 DD, trivial AI, MV repair ok (mild MS, mild MR), mild LAE, PASP 32 mmHg.   Hyperlipidemia    Hypertension    Ischemic cardiomyopathy    a. Echo (11/08/13):  EF 30-35%, diffuse hypokinesis, severe MR, mild LAE, PASP 48 mm Hg   Mitral regurgitation    a. s/p MV repair at time of CABG   Osteoporosis    PVD (peripheral vascular disease) (HCC)    Vitamin B12 deficiency     Past Surgical History:  Procedure Laterality Date   ABDOMINAL HYSTERECTOMY     1969   APPENDECTOMY     1945   CORONARY ARTERY BYPASS GRAFT  1990, 1995, 1998   HEMORRHOID SURGERY     1959   IR PATIENT  EVAL TECH 0-60 MINS  08/31/2018   LEFT AND RIGHT HEART CATHETERIZATION WITH CORONARY ANGIOGRAM N/A 11/10/2013   Procedure: LEFT AND RIGHT HEART CATHETERIZATION WITH CORONARY ANGIOGRAM;  Surgeon: Lennette Bihari, MD;  Location: [facility] CATH LAB;  Service: Cardiovascular;  Laterality: N/A;   MITRAL VALVE REPLACEMENT (MVR)/CORONARY ARTERY BYPASS GRAFTING (CABG) N/A 11/11/2013   Procedure: MITRAL VALVE Ring  repair (MVR)/CORONARY ARTERY BYPASS GRAFTING (CABG) times three using left internal mammary and left sphenous vein.;  Surgeon: Loreli Slot, MD;  Location: [facility] OR;  Service: Open Heart Surgery;  Laterality: N/A;  Coronary artery bypass graft times three using left internal mammary artery and left saphenous leg vein using endoscope.  Exploration of right leg.     Allergies: Meperidine hcl; Sulfonamide derivatives; and Demerol [meperidine]  Medications: Prior to Admission medications   Medication Sig Start Date End Date Taking? Authorizing Provider  Calcium Carbonate-Vitamin D (CALCIUM-VITAMIN D) 600-200 MG-UNIT CAPS Take 1 capsule by mouth 2 (two) times daily.    Yes [provider]  Casanthranol-Docusate Sodium 30-100 MG CAPS Take 1 capsule by mouth daily as needed (for constipation).    Yes [provider]  ferrous sulfate 325 (65 FE) MG tablet Take 325 mg by mouth 2 (two) times daily with a meal.    Yes [provider]  lisinopril (PRINIVIL,ZESTRIL) 40 MG tablet TAKE 1 TABLET BY MOUTH ONCE DAILY Patient taking differently: Take 40 mg by mouth daily.  08/25/17  Yes Iran Ouch, MD  Multiple Vitamin (MULTIVITAMIN) tablet Take 1 tablet by mouth daily.     Yes [provider]  pravastatin (PRAVACHOL) 40 MG tablet Take 1 tablet (40 mg total) by mouth daily. 04/15/18  Yes Seabron Spates R, DO  sitaGLIPtin (JANUVIA) 100 MG tablet Take 1 tablet (100 mg total) by mouth daily. 04/28/18  Yes Seabron Spates R, DO  Tetrahydrozoline HCl (VISINE OP) Apply 1 drop to eye daily as needed (dry eyes).   Yes [provider]  vitamin B-12 (CYANOCOBALAMIN) 1000 MCG tablet Take 1,000 mcg by mouth daily.     Yes [provider]  acetaminophen (TYLENOL) 325 MG tablet Take 2 tablets (650 mg total) by mouth every 6 (six) hours as needed for mild pain or headache (or Fever >/= 101). 08/30/18   Emokpae, Courage, MD  amoxicillin (AMOXIL) 500 MG capsule  Take 1 capsule (500 mg total) by mouth 3 (three) times daily for 5 days. 08/30/18 09/04/18  Shon Hale, MD  aspirin (ECOTRIN LOW STRENGTH) 81 MG EC tablet Take 1 tablet (81 mg total) by mouth daily with breakfast. 08/30/18   Mariea Clonts, Courage, MD  doxycycline (VIBRA-TABS) 100 MG tablet Take 1 tablet (100 mg total) by mouth 2 (two) times daily for 5 days. 08/30/18 09/04/18  Shon Hale, MD  metoprolol tartrate (LOPRESSOR) 50 MG tablet Take 1 tablet (50 mg total) by mouth 2 (two) times daily. 08/30/18   Shon Hale, MD     Family History  Problem Relation Age of Onset   Cervical cancer Mother    Cancer Brother        renal ...x3??   Hypertension Other    Stroke Other    Hypertension Sister    Heart attack Neg Hx     Social History   Socioeconomic History   Marital status: Widowed    Spouse name: Not on file   Number of children: Not on file   Years of education: Not on file   Highest education  level: Not on file  Occupational History   Occupation: retired    Associate Professor: RETIRED  Ecologist strain: Not on file   Food insecurity:    Worry: Not on file    Inability: Not on file   Transportation needs:    Medical: Not on file    Non-medical: Not on file  Tobacco Use   Smoking status: Former Smoker    Packs/day: 1.00    Years: 36.00    Pack years: 36.00    Types: Cigarettes    Last attempt to quit: 02/18/1984    Years since quitting: 34.5   Smokeless tobacco: Never Used  Substance and Sexual Activity   Alcohol use: No   Drug use: No   Sexual activity: Not Currently  Lifestyle   Physical activity:    Days per week: Not on file    Minutes per session: Not on file   Stress: Not on file  Relationships   Social connections:    Talks on phone: Not on file    Gets together: Not on file    Attends religious service: Not on file    Active member of club or organization: Not on file    Attends meetings of clubs or  organizations: Not on file    Relationship status: Not on file  Other Topics Concern   Not on file  Social History Narrative   Exercise-- yard, house work     Review of Systems: A 12 point ROS discussed and pertinent positives are indicated in the HPI above.  All other systems are negative.  Review of Systems  Unable to perform ROS: Mental status change    Vital Signs: BP (!) 173/59    Pulse 88    Temp 98.5 F (36.9 C) (Oral)    Resp 20    Ht 5' (1.524 m)    Wt 125 lb 3.5 oz (56.8 kg)    SpO2 100%    BMI 24.46 kg/m   Physical Exam Vitals signs and nursing note reviewed.  Constitutional:      General: She is not in acute distress.    Appearance: Normal appearance.  Cardiovascular:     Rate and Rhythm: Normal rate and regular rhythm.     Heart sounds: Normal heart sounds. No murmur.  Pulmonary:     Effort: Pulmonary effort is normal. No respiratory distress.     Breath sounds: Normal breath sounds. No wheezing.  Skin:    General: Skin is warm and dry.  Neurological:     Mental Status: She is alert. She is disoriented.      MD Evaluation Airway: WNL Heart: WNL Abdomen: WNL Chest/ Lungs: WNL ASA  Classification: 3 Mallampati/Airway Score: Two   Imaging: Ct Angio Head W Or Wo Contrast  Result Date: 08/31/2018 CLINICAL DATA:  Focal neuro deficit with stroke suspected.  Aphasia EXAM: CT ANGIOGRAPHY HEAD AND NECK TECHNIQUE: Multidetector CT imaging of the head and neck was performed using the standard protocol during bolus administration of intravenous contrast. Multiplanar CT image reconstructions and MIPs were obtained to evaluate the vascular anatomy. Carotid stenosis measurements (when applicable) are obtained utilizing NASCET criteria, using the distal internal carotid diameter as the denominator. CONTRAST:  75mL OMNIPAQUE IOHEXOL 350 MG/ML SOLN COMPARISON:  Head CT earlier today FINDINGS: CTA NECK FINDINGS Aortic arch: Atherosclerotic plaque. Partially visualized  changes of CABG. 2 vessel branching. Right carotid system: Mild calcified plaque at the bifurcation without flow limiting stenosis or  ulceration. Left carotid system: Bulky calcified plaque at the ICA bulb with severe stenosis due to irregular luminal plaque, pre occlusive in severity. The narrowing is so small that measurement may be misleading. There is symmetric downstream flow. Vertebral arteries: No proximal subclavian flow limiting stenosis. Dominant left vertebral artery. Skeleton: Degenerative changes without acute finding Other neck: Negative Upper chest: Central branching subsegmental pulmonary embolism in the right upper lobe, acute appearing Review of the MIP images confirms the above findings CTA HEAD FINDINGS Anterior circulation: Plaque on the carotid siphons. No emergent large vessel occlusion. Aplastic right A1 segment. Posterior circulation: Left dominant vertebral artery with calcified plaque on the V4 segment. The vertebral and basilar arteries are smooth and widely patent. Negative for aneurysm Venous sinuses: Patent Anatomic variants: As above Delayed phase: Not obtained in the emergent setting Critical Value/emergent results were called by telephone at the time of interpretation on 08/31/2018 at 8:29 am to Dr. Arther Dames , who verbally acknowledged these results. Review of the MIP images confirms the above findings IMPRESSION: 1. No emergent large vessel occlusion. 2. Severe, pre occlusive atheromatous stenosis at the left ICA origin. This is presumably the source of left MCA territory infarcts on prior head CT. 3. Acute subsegmental pulmonary embolism in the right upper lobe. Electronically Signed   By: Marnee Spring M.D.   On: 08/31/2018 08:30   Ct Angio Neck W Or Wo Contrast  Result Date: 08/31/2018 CLINICAL DATA:  Focal neuro deficit with stroke suspected.  Aphasia EXAM: CT ANGIOGRAPHY HEAD AND NECK TECHNIQUE: Multidetector CT imaging of the head and neck was performed using the  standard protocol during bolus administration of intravenous contrast. Multiplanar CT image reconstructions and MIPs were obtained to evaluate the vascular anatomy. Carotid stenosis measurements (when applicable) are obtained utilizing NASCET criteria, using the distal internal carotid diameter as the denominator. CONTRAST:  75mL OMNIPAQUE IOHEXOL 350 MG/ML SOLN COMPARISON:  Head CT earlier today FINDINGS: CTA NECK FINDINGS Aortic arch: Atherosclerotic plaque. Partially visualized changes of CABG. 2 vessel branching. Right carotid system: Mild calcified plaque at the bifurcation without flow limiting stenosis or ulceration. Left carotid system: Bulky calcified plaque at the ICA bulb with severe stenosis due to irregular luminal plaque, pre occlusive in severity. The narrowing is so small that measurement may be misleading. There is symmetric downstream flow. Vertebral arteries: No proximal subclavian flow limiting stenosis. Dominant left vertebral artery. Skeleton: Degenerative changes without acute finding Other neck: Negative Upper chest: Central branching subsegmental pulmonary embolism in the right upper lobe, acute appearing Review of the MIP images confirms the above findings CTA HEAD FINDINGS Anterior circulation: Plaque on the carotid siphons. No emergent large vessel occlusion. Aplastic right A1 segment. Posterior circulation: Left dominant vertebral artery with calcified plaque on the V4 segment. The vertebral and basilar arteries are smooth and widely patent. Negative for aneurysm Venous sinuses: Patent Anatomic variants: As above Delayed phase: Not obtained in the emergent setting Critical Value/emergent results were called by telephone at the time of interpretation on 08/31/2018 at 8:29 am to Dr. Arther Dames , who verbally acknowledged these results. Review of the MIP images confirms the above findings IMPRESSION: 1. No emergent large vessel occlusion. 2. Severe, pre occlusive atheromatous stenosis at  the left ICA origin. This is presumably the source of left MCA territory infarcts on prior head CT. 3. Acute subsegmental pulmonary embolism in the right upper lobe. Electronically Signed   By: Marnee Spring M.D.   On: 08/31/2018 08:30   Mr  Brain Wo Contrast  Result Date: 09/01/2018 CLINICAL DATA:  Focal neuro deficit. EXAM: MRI HEAD WITHOUT CONTRAST TECHNIQUE: Multiplanar, multiecho pulse sequences of the brain and surrounding structures were obtained without intravenous contrast. COMPARISON:  Head CT and CTA from yesterday FINDINGS: Brain: Overall moderate area of patchy acute infarction along the left more than right high cerebral convexities which could be embolic or watershed. There is a pre-existing smaller area of infarct at the high left frontal parietal convexity. There is petechial hemorrhage associated with a left-sided infarction. Patchy mainly peripheral remote microhemorrhages, suspicious for amyloid angiopathy. No acute hematoma, hydrocephalus, or masslike finding. Confluent chronic small vessel ischemic gliosis in the periventricular white matter. Small remote bilateral cerebellar infarcts. Vascular: Major flow voids are preserved.  There was CTA yesterday Skull and upper cervical spine: Negative for marrow lesion Sinuses/Orbits: Bilateral cataract resection IMPRESSION: 1. Moderate area of acute infarction along the left more than right high cerebral convexity, likely watershed. Mild petechial hemorrhage on the left 2. Background of extensive chronic small vessel ischemia in the cerebral white matter. 3. Scattered remote micro hemorrhages suspicious for amyloid angiopathy. Electronically Signed   By: Marnee SpringJonathon  Watts M.D.   On: 09/01/2018 05:56   Mr Brain Wo Contrast  Result Date: 08/25/2018 CLINICAL DATA:  Transient slurred speech this afternoon. History of tick bite, AVM, diabetes, hypertension and hyperlipidemia. EXAM: MRI HEAD WITHOUT CONTRAST TECHNIQUE: Multiplanar, multiecho pulse  sequences of the brain and surrounding structures were obtained without intravenous contrast. COMPARISON:  None. FINDINGS: Mild motion degraded examination. INTRACRANIAL CONTENTS: No reduced diffusion to suggest acute ischemia a few scattered chronic microhemorrhages noted. No parenchymal brain volume loss for age. No hydrocephalus. Old bilateral small cerebellar infarcts. Old LEFT thalamus and possible basal ganglia lacunar infarcts. Confluent supratentorial and patchy pontine white matter FLAIR T2 hyperintensities. Prominent basal ganglia perivascular spaces associated with chronic small vessel ischemic changes. Small bilateral frontal convexity encephalomalacia versus motion artifact. No suspicious parenchymal signal, masses, mass effect. No abnormal extra-axial fluid collections. No extra-axial masses. VASCULAR: Normal major intracranial vascular flow voids present at skull base. SKULL AND UPPER CERVICAL SPINE: No abnormal sellar expansion. No suspicious calvarial bone marrow signal. Craniocervical junction maintained. SINUSES/ORBITS: Included paranasal sinuses are well aerated. Minimal mastoid effusions. Included ocular globes and orbital contents are non-suspicious. Status post bilateral ocular lens implants. OTHER: None. IMPRESSION: 1. Mild motion degraded examination.  No acute intracranial process. 2. Moderate to severe chronic small vessel ischemic changes. Old cerebellar and deep gray nuclei infarcts. Old small frontal infarcts versus motion artifact. Electronically Signed   By: Awilda Metroourtnay  Bloomer M.D.   On: 08/25/2018 19:35   Dg Chest Port 1 View  Result Date: 09/01/2018 CLINICAL DATA:  Patient with acute onset right side weakness and altered mental status. EXAM: PORTABLE CHEST 1 VIEW COMPARISON:  Single-view of the chest 08/31/2018. PA and lateral chest 11/19/2013. FINDINGS: The patient is status post CABG and mitral valve repair. There is cardiomegaly and mild vascular congestion which appears  improved since yesterday's exam. No consolidative process or pneumothorax. Trace right pleural effusion noted. Aortic atherosclerosis seen. No acute or focal bony abnormality IMPRESSION: Vascular congestion seen on yesterday's examination appears improved. Cardiomegaly. Trace right pleural effusion. Atherosclerosis. Electronically Signed   By: Drusilla Kannerhomas  Dalessio M.D.   On: 09/01/2018 09:32   Dg Chest Port 1 View  Result Date: 08/31/2018 CLINICAL DATA:  Stroke with oral bleeding.  Question aspiration EXAM: PORTABLE CHEST 1 VIEW COMPARISON:  12/14/2013 FINDINGS: Interstitial coarsening without pulmonary edema or  airspace disease in the apices on recent chest CTA. There is likely a vascular congestion. Cardiomegaly with mitral valve replacement and CABG. No effusion or pneumothorax. IMPRESSION: Cardiomegaly and vascular congestion. Electronically Signed   By: Marnee Spring M.D.   On: 08/31/2018 09:36   Ir Patient Eval Tech 0-60 Mins  Result Date: 08/31/2018 Norton Pastel     08/31/2018  9:08 AM Paged out as code stroke for IR intervention, Neurologist Aroor cancelled after tray and supplies were opened.  Ct Head Code Stroke Wo Contrast  Result Date: 08/31/2018 CLINICAL DATA:  Code stroke.  Aphasia. EXAM: CT HEAD WITHOUT CONTRAST TECHNIQUE: Contiguous axial images were obtained from the base of the skull through the vertex without intravenous contrast. COMPARISON:  Brain MRI 08/25/2018 FINDINGS: Brain: Cortical low-density along the high left frontal parietal convexity, increased in extent from prior brain MRI. No acute hemorrhage, hydrocephalus, or collection. Age congruent cerebral volume loss. Confluent chronic small vessel ischemic change in the cerebral white matter. Small remote bilateral cerebellar infarcts. Vascular: Atherosclerotic calcification.  No hyperdense vessel. Skull: Negative Sinuses/Orbits: Bilateral cataract resection Other: These results were communicated to Dr. Laurence Slate at 7:56 amon  4/6/2020by text page via the [facility] messaging system. ASPECTS [facility] Stroke Program Early CT Score) - Ganglionic level infarction (caudate, lentiform nuclei, internal capsule, insula, M1-M3 cortex): 7 - Supraganglionic infarction (M4-M6 cortex): 1 -2 Total score (0-10 with 10 being normal): 8 to 9 IMPRESSION: 1. Acute on chronic high left cerebral infarcions, ASCPETS is 8 to 9. 2. No acute hemorrhage. Electronically Signed   By: Marnee Spring M.D.   On: 08/31/2018 07:58   Vas [place] Lower Extremity Venous (dvt) (only [facility])  Result Date: 08/30/2018  Lower Venous Study Indications: Edema, and Multiple tick bites.  Comparison Study: No prior study on file for comparison Performing Technologist: Sherren Kerns RVS  Examination Guidelines: A complete evaluation includes B-mode imaging, spectral Doppler, color Doppler, and power Doppler as needed of all accessible portions of each vessel. Bilateral testing is considered an integral part of a complete examination. Limited examinations for reoccurring indications may be performed as noted.  Right Venous Findings: +---+---------------+---------+-----------+----------+-------+      Compressibility Phasicity Spontaneity Properties Summary  +---+---------------+---------+-----------+----------+-------+  CFV Full            Yes       Yes                             +---+---------------+---------+-----------+----------+-------+  Left Venous Findings: +---------+---------------+---------+-----------+----------+-------+            Compressibility Phasicity Spontaneity Properties Summary  +---------+---------------+---------+-----------+----------+-------+  CFV       Full            Yes       Yes                             +---------+---------------+---------+-----------+----------+-------+  SFJ       Full                                                      +---------+---------------+---------+-----------+----------+-------+  FV Prox   Full                                                       +---------+---------------+---------+-----------+----------+-------+  FV Mid    Full                                                      +---------+---------------+---------+-----------+----------+-------+  FV Distal Full                                                      +---------+---------------+---------+-----------+----------+-------+  PFV       Full                                                      +---------+---------------+---------+-----------+----------+-------+  POP       Full            Yes       Yes                             +---------+---------------+---------+-----------+----------+-------+  PTV       Full                                                      +---------+---------------+---------+-----------+----------+-------+  PERO      Full                                                      +---------+---------------+---------+-----------+----------+-------+  GSV       Full                                                      +---------+---------------+---------+-----------+----------+-------+    Summary: Right: No evidence of common femoral vein obstruction. Left: No evidence of common femoral vein obstruction. Ultrasound characteristics of enlarged lymph nodes noted in the groin.  *See table(s) above for measurements and observations. Electronically signed by Sherald Hess MD on 08/30/2018 at 9:06:31 AM.    Final     Labs:  CBC: Recent Labs    08/25/18 1737 08/29/18 1411 08/30/18 0624 08/31/18 1125  WBC 9.5 7.8 7.5 12.3*  HGB 10.3* 9.9* 9.2* 9.9*  HCT 31.5* 29.2* 28.3* 30.1*  PLT 167 239 219 237    COAGS: Recent Labs    08/25/18 1737 08/31/18 1125  INR 1.2 1.3*  APTT 29  --     BMP: Recent Labs    08/25/18 1737 08/29/18 1411 08/30/18 0624 08/31/18 1125  NA 137 141 140 141  K 3.9 3.7 3.9 3.9  CL 104 109 109 110  CO2 22 21* 22 21*  GLUCOSE 145* 132* 147* 161*  BUN 30* 18 16 16  CALCIUM 9.0 9.5 9.2 9.0  CREATININE 1.65* 1.03* 1.07*  1.13*  GFRNONAA 28* 49* 47* 44*  GFRAA 32* 57* 54* 51*    LIVER FUNCTION TESTS: Recent Labs    04/07/18 1509 07/20/18 0932 08/25/18 1737 08/31/18 1125  BILITOT 0.5 0.5 0.9 0.7  AST ALT ALKPHOS 42 44 40 43  PROT 6.9 6.7 7.3 6.0*  ALBUMIN 4.4 4.3 3.9 3.1*     Assessment and Plan:  CVA. Plan for image-guided diagnostic cerebral arteriogram today with Dr. Corliss Skains. Patient is NPO. Afebrile. Ok to proceed with Aspirin, will hold IV Heparin when calling for patient per Dr. Corliss Skains. INR 1.3 seconds yesterday.  Consent obtained via telephone from patient's son, Isbella Arline.  Risks and benefits of cerebral arteriogram were discussed with the patient including, but not limited to bleeding, infection, vascular injury or contrast induced renal failure. This interventional procedure involves the use of X-rays and because of the nature of the planned procedure, it is possible that we will have prolonged use of X-ray fluoroscopy. Potential radiation risks to you include (but are not limited to) the following: - A slightly elevated risk for cancer  several years later in life. This risk is typically less than 0.5% percent. This risk is low in comparison to the normal incidence of human cancer, which is 33% for women and 50% for men according to the [employer]. - Radiation induced injury can include skin redness, resembling a rash, tissue breakdown / ulcers and hair loss (which can be temporary or permanent).  The likelihood of either of these occurring depends on the difficulty of the procedure and whether you are sensitive to radiation due to previous procedures, disease, or genetic conditions.  IF your procedure requires a prolonged use of radiation, you will be notified and given written instructions for further action.  It is your responsibility to monitor the irradiated area for the 2 weeks following the procedure and to notify your physician  if you are concerned that you have suffered a radiation induced injury.   All of the patient's questions were answered, patient is agreeable to proceed. Consent signed and in chart.   Thank you for this interesting consult.  I greatly enjoyed meeting Emily Daniel and look forward to participating in their care.  A copy of this report was sent to the requesting provider on this date.  Electronically Signed: Elwin Mocha, PA-C 09/01/2018, 10:55 AM   I spent a total of 40 Minutes in face to face in clinical consultation, greater than 50% of which was counseling/coordinating care for CVA/diagnostic cerebral arteriogram.

## 2018-09-01 NOTE — Sedation Documentation (Signed)
Heparin turned off at 1045 for procedure.  Pt has cleviprex infusing at 1mg /hr, NS at 42ml.

## 2018-09-01 NOTE — Progress Notes (Signed)
4/7 Tele-visit facilitated by e-Link with various family members.

## 2018-09-01 NOTE — Sedation Documentation (Signed)
Pt in IR 2, transferred to IR table with slide board and 5 person assist.  Pt positioned and secured to IR table.  O2 2L via [place] started.  Pt on continuous cardiac monitoring.

## 2018-09-01 NOTE — Procedures (Addendum)
S/P 4 vessel cerebral arterriogram  Lt CFA approach. Findings . 1.Approx 90 % plus  stenosis of Lt ICA prox

## 2018-09-01 NOTE — Progress Notes (Signed)
ANTICOAGULATION CONSULT NOTE  Pharmacy Consult for Heparin Indication: pulmonary embolus  Allergies  Allergen Reactions  . Meperidine Hcl Nausea And Vomiting  . Sulfonamide Derivatives Nausea And Vomiting  . Demerol [Meperidine]     Unknown per pt     Patient Measurements: Height: 5' (152.4 cm) Weight: 125 lb 3.5 oz (56.8 kg) IBW/kg (Calculated) : 45.5  Vital Signs: Temp: 98.5 F (36.9 C) (04/07 0800) Temp Source: Oral (04/07 0800) BP: 162/63 (04/07 0700) Pulse Rate: 123 (04/07 0700)  Labs: Recent Labs    08/29/18 1411 08/30/18 0624 08/31/18 0639 08/31/18 1125 08/31/18 1804  HGB 9.9* 9.2*  --  9.9*  --   HCT 29.2* 28.3*  --  30.1*  --   PLT 239 219  --  237  --   LABPROT  --   --   --  16.0*  --   INR  --   --   --  1.3*  --   CREATININE 1.03* 1.07*  --  1.13*  --   TROPONINI  --   --  <0.03 <0.03 <0.03    Estimated Creatinine Clearance: 28.2 mL/min (A) (by C-G formula based on SCr of 1.13 mg/dL (H)).  Assessment: 85 year old female with a history of Afib not on anti-coagulation prior to admission admitted with new CVA.  Also, found to have a pulmonary embolism.  She received TPA 08/31/18 at 8 am.  Pharmacy consulted to begin heparin 24 hours post TPA administration with low heparin goal and no bolus.  Goal of Therapy:  Heparin level = 0.3-0.5 Monitor platelets by anticoagulation protocol: Yes   Plan:  Heparin drip at 650 units / hr  Heparin level 8 hours after heparin begins Daily heparin level, CBC  Thank you Okey Regal, PharmD [phone]  09/01/2018,9:11 AM

## 2018-09-01 NOTE — Progress Notes (Signed)
STROKE TEAM PROGRESS NOTE   INTERVAL HISTORY Patient lying in bed, awake alert, still has aphasia, and right hemiparesis.  MRI showed left MCA and bilateral ACA infarcts, consistent with left ICA bulb high-grade stenosis.  Cerebral angiogram pending.  Vitals:   09/01/18 0400 09/01/18 0500 09/01/18 0600 09/01/18 0700  BP: (!) 142/59 (!) 155/68 (!) 164/65 (!) 162/63  Pulse: 80 87 86 (!) 123  Resp: 17 20 (!) 22 (!) 23  Temp: 98.1 F (36.7 C)     TempSrc: Axillary     SpO2: 98% 92% 100% 91%  Weight:      Height:        CBC:  Recent Labs  Lab 08/25/18 1737 08/29/18 1411 08/30/18 0624 08/31/18 1125  WBC 9.5 7.8 7.5 12.3*  NEUTROABS 6.3 4.9  --   --   HGB 10.3* 9.9* 9.2* 9.9*  HCT 31.5* 29.2* 28.3* 30.1*  MCV 106.4* 102.1* 101.8* 100.7*  PLT 167 239 219 237    Basic Metabolic Panel:  Recent Labs  Lab 08/30/18 0624 08/31/18 1125  NA 140 141  K 3.9 3.9  CL 109 110  CO2 22 21*  GLUCOSE 147* 161*  BUN 16 16  CREATININE 1.07* 1.13*  CALCIUM 9.2 9.0   Lipid Panel:     Component Value Date/Time   CHOL 134 09/01/2018 0308   TRIG 85 09/01/2018 0308   HDL 38 (L) 09/01/2018 0308   CHOLHDL 3.5 09/01/2018 0308   VLDL 17 09/01/2018 0308   LDLCALC 79 09/01/2018 0308   HgbA1c:  Lab Results  Component Value Date   HGBA1C 5.9 (H) 09/01/2018   Urine Drug Screen:     Component Value Date/Time   LABOPIA NONE DETECTED 08/25/2018 1945   COCAINSCRNUR NONE DETECTED 08/25/2018 1945   LABBENZ NONE DETECTED 08/25/2018 1945   AMPHETMU NONE DETECTED 08/25/2018 1945   THCU NONE DETECTED 08/25/2018 1945   LABBARB NONE DETECTED 08/25/2018 1945    Alcohol Level     Component Value Date/Time   ETH <10 08/25/2018 1736    IMAGING Ct Angio Head W Or Wo Contrast  Result Date: 08/31/2018 CLINICAL DATA:  Focal neuro deficit with stroke suspected.  Aphasia EXAM: CT ANGIOGRAPHY HEAD AND NECK TECHNIQUE: Multidetector CT imaging of the head and neck was performed using the standard  protocol during bolus administration of intravenous contrast. Multiplanar CT image reconstructions and MIPs were obtained to evaluate the vascular anatomy. Carotid stenosis measurements (when applicable) are obtained utilizing NASCET criteria, using the distal internal carotid diameter as the denominator. CONTRAST:  17mL OMNIPAQUE IOHEXOL 350 MG/ML SOLN COMPARISON:  Head CT earlier today FINDINGS: CTA NECK FINDINGS Aortic arch: Atherosclerotic plaque. Partially visualized changes of CABG. 2 vessel branching. Right carotid system: Mild calcified plaque at the bifurcation without flow limiting stenosis or ulceration. Left carotid system: Bulky calcified plaque at the ICA bulb with severe stenosis due to irregular luminal plaque, pre occlusive in severity. The narrowing is so small that measurement may be misleading. There is symmetric downstream flow. Vertebral arteries: No proximal subclavian flow limiting stenosis. Dominant left vertebral artery. Skeleton: Degenerative changes without acute finding Other neck: Negative Upper chest: Central branching subsegmental pulmonary embolism in the right upper lobe, acute appearing Review of the MIP images confirms the above findings CTA HEAD FINDINGS Anterior circulation: Plaque on the carotid siphons. No emergent large vessel occlusion. Aplastic right A1 segment. Posterior circulation: Left dominant vertebral artery with calcified plaque on the V4 segment. The vertebral and basilar arteries are  smooth and widely patent. Negative for aneurysm Venous sinuses: Patent Anatomic variants: As above Delayed phase: Not obtained in the emergent setting Critical Value/emergent results were called by telephone at the time of interpretation on 08/31/2018 at 8:29 am to Dr. Arther Dames , who verbally acknowledged these results. Review of the MIP images confirms the above findings IMPRESSION: 1. No emergent large vessel occlusion. 2. Severe, pre occlusive atheromatous stenosis at the left  ICA origin. This is presumably the source of left MCA territory infarcts on prior head CT. 3. Acute subsegmental pulmonary embolism in the right upper lobe. Electronically Signed   By: Marnee Spring M.D.   On: 08/31/2018 08:30   Ct Angio Neck W Or Wo Contrast  Result Date: 08/31/2018 CLINICAL DATA:  Focal neuro deficit with stroke suspected.  Aphasia EXAM: CT ANGIOGRAPHY HEAD AND NECK TECHNIQUE: Multidetector CT imaging of the head and neck was performed using the standard protocol during bolus administration of intravenous contrast. Multiplanar CT image reconstructions and MIPs were obtained to evaluate the vascular anatomy. Carotid stenosis measurements (when applicable) are obtained utilizing NASCET criteria, using the distal internal carotid diameter as the denominator. CONTRAST:  75mL OMNIPAQUE IOHEXOL 350 MG/ML SOLN COMPARISON:  Head CT earlier today FINDINGS: CTA NECK FINDINGS Aortic arch: Atherosclerotic plaque. Partially visualized changes of CABG. 2 vessel branching. Right carotid system: Mild calcified plaque at the bifurcation without flow limiting stenosis or ulceration. Left carotid system: Bulky calcified plaque at the ICA bulb with severe stenosis due to irregular luminal plaque, pre occlusive in severity. The narrowing is so small that measurement may be misleading. There is symmetric downstream flow. Vertebral arteries: No proximal subclavian flow limiting stenosis. Dominant left vertebral artery. Skeleton: Degenerative changes without acute finding Other neck: Negative Upper chest: Central branching subsegmental pulmonary embolism in the right upper lobe, acute appearing Review of the MIP images confirms the above findings CTA HEAD FINDINGS Anterior circulation: Plaque on the carotid siphons. No emergent large vessel occlusion. Aplastic right A1 segment. Posterior circulation: Left dominant vertebral artery with calcified plaque on the V4 segment. The vertebral and basilar arteries are smooth  and widely patent. Negative for aneurysm Venous sinuses: Patent Anatomic variants: As above Delayed phase: Not obtained in the emergent setting Critical Value/emergent results were called by telephone at the time of interpretation on 08/31/2018 at 8:29 am to Dr. Arther Dames , who verbally acknowledged these results. Review of the MIP images confirms the above findings IMPRESSION: 1. No emergent large vessel occlusion. 2. Severe, pre occlusive atheromatous stenosis at the left ICA origin. This is presumably the source of left MCA territory infarcts on prior head CT. 3. Acute subsegmental pulmonary embolism in the right upper lobe. Electronically Signed   By: Marnee Spring M.D.   On: 08/31/2018 08:30   Mr Brain Wo Contrast  Result Date: 09/01/2018 CLINICAL DATA:  Focal neuro deficit. EXAM: MRI HEAD WITHOUT CONTRAST TECHNIQUE: Multiplanar, multiecho pulse sequences of the brain and surrounding structures were obtained without intravenous contrast. COMPARISON:  Head CT and CTA from yesterday FINDINGS: Brain: Overall moderate area of patchy acute infarction along the left more than right high cerebral convexities which could be embolic or watershed. There is a pre-existing smaller area of infarct at the high left frontal parietal convexity. There is petechial hemorrhage associated with a left-sided infarction. Patchy mainly peripheral remote microhemorrhages, suspicious for amyloid angiopathy. No acute hematoma, hydrocephalus, or masslike finding. Confluent chronic small vessel ischemic gliosis in the periventricular white matter. Small remote bilateral  cerebellar infarcts. Vascular: Major flow voids are preserved.  There was CTA yesterday Skull and upper cervical spine: Negative for marrow lesion Sinuses/Orbits: Bilateral cataract resection IMPRESSION: 1. Moderate area of acute infarction along the left more than right high cerebral convexity, likely watershed. Mild petechial hemorrhage on the left 2. Background  of extensive chronic small vessel ischemia in the cerebral white matter. 3. Scattered remote micro hemorrhages suspicious for amyloid angiopathy. Electronically Signed   By: Marnee Spring M.D.   On: 09/01/2018 05:56   Dg Chest Port 1 View  Result Date: 08/31/2018 CLINICAL DATA:  Stroke with oral bleeding.  Question aspiration EXAM: PORTABLE CHEST 1 VIEW COMPARISON:  12/14/2013 FINDINGS: Interstitial coarsening without pulmonary edema or airspace disease in the apices on recent chest CTA. There is likely a vascular congestion. Cardiomegaly with mitral valve replacement and CABG. No effusion or pneumothorax. IMPRESSION: Cardiomegaly and vascular congestion. Electronically Signed   By: Marnee Spring M.D.   On: 08/31/2018 09:36   Ir Patient Eval Tech 0-60 Mins  Result Date: 08/31/2018 Norton Pastel     08/31/2018  9:08 AM Paged out as code stroke for IR intervention, Neurologist Aroor cancelled after tray and supplies were opened.  Ct Head Code Stroke Wo Contrast  Result Date: 08/31/2018 CLINICAL DATA:  Code stroke.  Aphasia. EXAM: CT HEAD WITHOUT CONTRAST TECHNIQUE: Contiguous axial images were obtained from the base of the skull through the vertex without intravenous contrast. COMPARISON:  Brain MRI 08/25/2018 FINDINGS: Brain: Cortical low-density along the high left frontal parietal convexity, increased in extent from prior brain MRI. No acute hemorrhage, hydrocephalus, or collection. Age congruent cerebral volume loss. Confluent chronic small vessel ischemic change in the cerebral white matter. Small remote bilateral cerebellar infarcts. Vascular: Atherosclerotic calcification.  No hyperdense vessel. Skull: Negative Sinuses/Orbits: Bilateral cataract resection Other: These results were communicated to Dr. Laurence Slate at 7:56 amon 4/6/2020by text page via the [facility] messaging system. ASPECTS [facility] Stroke Program Early CT Score) - Ganglionic level infarction (caudate, lentiform nuclei, internal capsule,  insula, M1-M3 cortex): 7 - Supraganglionic infarction (M4-M6 cortex): 1 -2 Total score (0-10 with 10 being normal): 8 to 9 IMPRESSION: 1. Acute on chronic high left cerebral infarcions, ASCPETS is 8 to 9. 2. No acute hemorrhage. Electronically Signed   By: Marnee Spring M.D.   On: 08/31/2018 07:58   EEG This is an abnormal EEG secondary to general background slowing.  This finding may be seen with a diffuse disturbance that is etiologically nonspecific, but may include a metabolic encephalopathy.  Can not rule out normal drowse as well.  No epileptiform activity was noted.    2D Echocardiogram bubble  1. The left ventricle has normal systolic function, with an ejection fraction of 60-65%. The cavity size was normal. Left ventricular diastolic function could not be evaluated due to nondiagnostic images.  2. The right ventricle has normal systolc function. The cavity was normal. There is no increase in right ventricular wall thickness.  3. RVSP moderate-severely elevated, with range of 43 mmHg-55 mmHg if RA pressures is estimated bewteen 3-15 mmHg. IVC not assessed on today's exam.  4. A 30 mm Sorin memo 3D annuloplasty ring is present in the mitral position Dorris Fetch 11/11/2013). No dehiscence.  5. Mitral diastolic prosthetic gradient severely elevated, 10 mmHg (HR 83 bpm). Trivial mitral valve regurgitation. No pannus or thrombus noted on ring.  6. Tricuspid valve regurgitation is mild-moderate.  7. The aortic valve is abnormal Moderate calcification of the aortic valve. Aortic valve  regurgitation is trivial by color flow Doppler.  8. Severly calcified aorta sinotubular junction, prominent on posterior portion of ST junction.  9. No intracardiac thrombi or masses were visualized. Saline contrast bubble study was negative, with no evidence of any interatrial shunt.   PHYSICAL EXAM  Temp:  [98.1 F (36.7 C)-99.1 F (37.3 C)] 98.1 F (36.7 C) (04/07 0400) Pulse Rate:  [70-123] 123 (04/07  0700) Resp:  [13-33] 23 (04/07 0700) BP: (142-192)/(55-73) 162/63 (04/07 0700) SpO2:  [91 %-100 %] 91 % (04/07 0700)  General - Well nourished, well developed, in no apparent distress.  Ophthalmologic - fundi not visualized due to noncooperation.  Cardiovascular - Regular rate and rhythm, not in afib.  Neuro - awake alert, eyes open, able to tell me "in [place]", and her DOB, but not able to tell me time or age. She then only repeat sentences and not answer questions appropriately. Able to name. Blinking to visual threat to right, but able to attend to both sides, able to track bilaterally. PERRL, EOMI. Right nasolabial fold flattening. LUE and LLE 4+/5, RUE 0/5 with increased muscle tone. RLE 3/5. DTR 1+ and right babinski. Sensation symmetrical as per pt, not cooperative on coordination testing. Gait not tested.    ASSESSMENT/PLAN Ms. LEILANIE RAUDA is a 85 y.o. female with history of CAD S/P CABG, PAD, HFrEF, paroxysmal A. Fib. Not on anticoagulation, AVMs, and multiple areas of small frontal infarcts on MRI was admitted on 08/29/18 due to confusion and worsening mental status. Treated for UTI and cellulitis. In hospital, she developed CP w/o ST changes and treated w/ morphine. 1 hr later emesis. Then noted global aphasia, R facial and R hemiparesis.  Received IV tPA 08/31/2018 at 0756.  Stroke: Left MCA and bilateral ACA scattered infarcts, consistent with left ICA proximal high-grade stenosis (azygous ACAs) and AF not on AC  Code Stroke CT head acute on chronic high L infarcts. ASPECTS 8    CTA head & neck no ELVO. Severe, pre occlusive stenosis L ICA origin. RUL PE.  Azygous ACAs coming out from left ICA terminal  CXR CM and vascular congestion  MRI  Moderate L>R high cerebral convexity watershed infarcts. Mild PH on the L. Small vessel disease. Micro hmgs suspicious for CAA.  2D Echo EF 60-65%. No source of embolus. No clot/mass. Bubble study neg.   Cerebral angio left ICA  proximal 90%+ stenosis  EEG generalized background slowing. No sz.  LDL 79  HgbA1c 5.9  SCDs for VTE prophylaxis  aspirin 81 mg daily prior to admission, now on aspirin 81 mg daily.  Loaded with Brilinta, continue heparin IV.  Therapy recommendations:  pending   Disposition:  pending  Transfer back to [facility] when moves out of ICU  Chest Pain/ RUL PE  LE dopplers neg DVT  Received IV tPA 11/30/2018 for stroke  2D Echo EF 60-65%. No source of embolus. No clot/mass. Bubble study neg. Mult cardiac abnormalities.  On heparin IV, will transition to po on discharge  Atrial Fibrillation  Home anticoagulation:  none   family not eager to start [facility] at time of admission . Currently in NSR . On metoprolol  . On heparin IV, will transition to po on discharge   Carotid Stenosis  CTA L ICA 90% stenosis  Confirmed on cerebral angio  On ASA and brilinta  P2Y12 pending  Dr. Corliss Skains plan for left ICA stenting in am  BP goal 130-160 before intervention  Hypertension  BP on  the high end  Home meds:  Lisinopril 40, metoprolol 50 bid Treated w/ labetolol and cleviprex to keep in goal . Resume po meds after passing swallow . Long-term BP goal normotensive  Hyperlipidemia  Home meds:  pravachol 40, resumed in hospital  LDL 79, goal < 70  Continue pravastatin 40  Continue statin at discharge  Diabetes type II Controlled w/ PVD  Home meds:  januvia 100  HgbA1c 5.9, goal < 7.0  CBGs  SSI  Close PCP follow up  Other Stroke Risk Factors  Advanced age  Former Cigarette smoker, quit 2134 y ago  Family hx stroke (other)  Coronary artery disease s/p NSTEMI, CABG  Chronic systolic Congestive heart failure  Ischemic cardiomyopathy   Mitral regurg s/p repair at time of CABG  PVD  Other Active Problems  UTI. Aerococcus urinae. On amoxicillin po->IV keflex & Doxy. WBC 12.3 - change back to po once passed swallow  LLE cellulitis. Was on  doxycycline, amoxicillin added for UTI. LE dopplers neg for DVT. Switch to IV Keflex and IV Doxy. WBC 12.3 - change back to po once passed swallow  Renal insufficiency Cr 1.07->1.13  Hospital day # 1  This patient is critically ill due to stroke status post TPA, left carotid high-grade stenosis, A. fib not on AC, PE and at significant risk of neurological worsening, death form recurrent stroke, hypertensive emergency, hemorrhagic conversion, heart failure, massive PE. This patient's care requires constant monitoring of vital signs, hemodynamics, respiratory and cardiac monitoring, review of multiple databases, neurological assessment, discussion with family, other specialists and medical decision making of high complexity. I spent 45 minutes of neurocritical care time in the care of this patient.  Marvel PlanJindong Baleria Wyman, MD PhD Stroke Neurology 09/01/2018 6:44 PM   To contact Stroke Continuity provider, please refer to [email or web address]. After hours, contact General Neurology

## 2018-09-01 NOTE — Progress Notes (Signed)
Patient underwent an image-guided diagnostic cerebral arteriogram today with Dr. Corliss Skains.  Procedure revealed severe proximal left ICA stenosis that is the probable cause of her CVA and believes this is amendable for revascularization. Plan for procedure tomorrow. Consent obtained from patient's son, Molly Maduro, via telephone (in IR). Will load with Brilinta 180 mg today, then begin Brilinta 90 mg twice daily tonight (with only one dose given tonight). Will start Aspirin 81 mg once daily. Obtain P2Y12 at 0500 tomorrow. Lauren, RN aware.  Please call IR with questions/concerns.  Waylan Boga Louk, PA-C 09/01/2018, 1:08 PM

## 2018-09-01 NOTE — Sedation Documentation (Signed)
[facility] [employer] [facility] removed sheath and is holding manual pressure to left groin site

## 2018-09-01 NOTE — Evaluation (Signed)
Speech Language Pathology Evaluation Patient Details Name: Emily Daniel MRN: [id number] DOB: [birth year] Today's Date: 09/01/2018 Time: 4098-1191 SLP Time Calculation (min) (ACUTE ONLY): 30 min  Problem List:  Patient Active Problem List   Diagnosis Date Noted  . Acute ischemic left MCA stroke (HCC) 08/31/2018  . Cellulitis of left leg without foot 08/29/2018  . Acute lower UTI 08/29/2018  . Acute metabolic encephalopathy 08/29/2018  . TIA (transient ischemic attack) 08/25/2018  . Trochanteric bursitis, right hip 07/01/2016  . Chronic right-sided low back pain without sciatica 07/01/2016  . PAD (peripheral artery disease) (HCC) 08/09/2014  . Leg pain 05/23/2014  . Atrial fibrillation (HCC) 11/22/2013  . Mitral valve disorders(424.0) 11/22/2013  . S/P mitral valve repair 11/19/2013  . S/P CABG x 3 11/11/2013  . Chest pain 11/08/2013  . NSTEMI (non-ST elevated myocardial infarction) (HCC) 11/08/2013  . Iron deficiency anemia 11/08/2013  . Acute heart failure (HCC) 11/08/2013  . Cerumen impaction 09/30/2013  . B12 DEFICIENCY 06/21/2010  . Osteoporosis 03/16/2010  . BACK PAIN 01/02/2010  . PVD 04/14/2009  . ARTERIOVENOUS MALFORMATION, COLON 04/14/2009  . CHANGE IN BOWELS 04/14/2009  . DM (diabetes mellitus) type II uncontrolled, periph vascular disorder (HCC) 03/13/2009  . Anxiety state 03/13/2009  . DYSURIA 04/01/2008  . Hyperlipidemia 05/18/2007  . Essential hypertension 02/02/2007  . ARTIFICIAL MENOPAUSE 02/02/2007   Past Medical History:  Past Medical History:  Diagnosis Date  . Anxiety   . Atrial fibrillation (HCC)    post op >>> req'd Amio and Coumadin  . AVM (arteriovenous malformation)   . CAD (coronary artery disease)    a. NSTEMI 10/2013 >>> LHC (10/2013):  Distal left main 90%, proximal LAD 30%, mid LAD 20%, ostial circumflex 99%, then 80%, mid RCA 99%, EF 30-35%, 3-4+ MR; proximal right iliac occluded, right renal 50%, ostial left iliac 70% >>> CABG/MV repair   . Cataract   . CHF (congestive heart failure) (HCC)    Dr. Dorris Fetch following  . Chronic systolic heart failure (HCC)   . Diabetes mellitus, type 2 (HCC)   . Hemorrhoids   . Hx of echocardiogram    Echo (9/15):  Mild LVH, EF 40-45%, inf AK, Gr 1 DD, trivial AI, MV repair ok (mild MS, mild MR), mild LAE, PASP 32 mmHg.  [name] Hyperlipidemia   . Hypertension   . Ischemic cardiomyopathy    a. Echo (11/08/13):  EF 30-35%, diffuse hypokinesis, severe MR, mild LAE, PASP 48 mm Hg  . Mitral regurgitation    a. s/p MV repair at time of CABG  . Osteoporosis   . PVD (peripheral vascular disease) (HCC)   . Vitamin B12 deficiency    Past Surgical History:  Past Surgical History:  Procedure Laterality Date  . ABDOMINAL HYSTERECTOMY     1969  . APPENDECTOMY     1945  . CORONARY ARTERY BYPASS GRAFT  1990, 1995, 1998  . HEMORRHOID SURGERY     1959  . IR PATIENT EVAL TECH 0-60 MINS  08/31/2018  . LEFT AND RIGHT HEART CATHETERIZATION WITH CORONARY ANGIOGRAM N/A 11/10/2013   Procedure: LEFT AND RIGHT HEART CATHETERIZATION WITH CORONARY ANGIOGRAM;  Surgeon: Lennette Bihari, MD;  Location: [facility] CATH LAB;  Service: Cardiovascular;  Laterality: N/A;  . MITRAL VALVE REPLACEMENT (MVR)/CORONARY ARTERY BYPASS GRAFTING (CABG) N/A 11/11/2013   Procedure: MITRAL VALVE Ring repair (MVR)/CORONARY ARTERY BYPASS GRAFTING (CABG) times three using left internal mammary and left sphenous vein.;  Surgeon: Loreli Slot, MD;  Location: [facility] OR;  Service: Open Heart Surgery;  Laterality: N/A;  Coronary artery bypass graft times three using left internal mammary artery and left saphenous leg vein using endoscope.  Exploration of right leg.    HPI:  [name] with PMH: anxiety, CAD s/p CABG, peripheral artery disease, CHF, DM, intermittent atrial fibrillation not on anticoagulation, AVM's, hyperlipidemia and prior tobacco abuse, and multiple areas of old small frontal infarctions on MRI, who presented to ED  with confusion  with MR of brain showing moderate area of acute infarction left more than right at the high cerebral convexities which could be embolic or watershed.   Assessment / Plan / Recommendation Clinical Impression  Patient presents with a mod-severe expressive aphasia and a min-mod receptive aphasia and questionable impairments in cognitive functioning with difficulty in assessing due to language impairments. Patient was inconsistently aware of language errors but throughout the session she would appear confused as to why SLP was evaluating her and seemed confused as to why she was being asked questions such as naming objects and identifying numbers on a clock. Patient exhibited echolalia, perseveration on verbal production as well as perseveration on actions (ie: when asked to point to nose, she perseverated on showing SLP her fingers and said "point to nose" without completing action.) Patient was able to count to 10 with initial prompt of "1..." and able to sing ABC song with initial prompt. She was able to answer basic level biographical questions with yes/no format and was able to answer basic level biographical open-ended questions, "what are your sons' names?" with minimal assistance. She was able to name basic objets (pen, clock, etc) but was not able to participate in categorical naming or naming objects by function. Patient was able to point to pictures in field of 6 when object was named or object function decribed, and was 100% accurate but with delays in responding.     SLP Assessment  SLP Recommendation/Assessment: Patient needs continued Speech Lanaguage Pathology Services SLP Visit Diagnosis: Aphasia (R47.01);Cognitive communication deficit (R41.841)    Follow Up Recommendations  Inpatient Rehab    Frequency and Duration min 2x/week  2 weeks      SLP Evaluation Cognition  Overall Cognitive Status: Impaired/Different from baseline Orientation Level: Oriented to  person;Oriented to place Attention: Selective Sustained Attention: Appears intact Selective Attention: Impaired Selective Attention Impairment: Verbal complex Awareness: Impaired Awareness Impairment: Intellectual impairment Behaviors: Perseveration Safety/Judgment: Impaired Comments: Patient did not exhibit consistent awareness to deficits       Comprehension  Auditory Comprehension Overall Auditory Comprehension: Impaired Yes/No Questions: Impaired Basic Biographical Questions: 76-100% accurate Basic Immediate Environment Questions: 50-74% accurate Commands: Impaired One Step Basic Commands: 25-49% accurate Conversation: Simple Interfering Components: Processing speed EffectiveTechniques: Extra processing time;Repetition Visual Recognition/Discrimination Discrimination: Not tested Reading Comprehension Reading Status: Not tested    Expression Expression Primary Mode of Expression: Verbal Verbal Expression Overall Verbal Expression: Impaired Automatic Speech: Singing;Counting Level of Generative/Spontaneous Verbalization: Word Repetition: Impaired Level of Impairment: Phrase level Naming: Impairment Responsive: 0-25% accurate Confrontation: Impaired Convergent: Not tested Divergent: Not tested Verbal Errors: Perseveration;Aware of errors;Not aware of errors;Echolalia;Semantic paraphasias Pragmatics: No impairment Effective Techniques: Sentence completion;Semantic cues;Other (Comment)(choice cues) Non-Verbal Means of Communication: Not applicable   Oral / Motor  Oral Motor/Sensory Function Overall Oral Motor/Sensory Function: Within functional limits Motor Speech Overall Motor Speech: Appears within functional limits for tasks assessed Articulation: Within functional limitis Intelligibility: Intelligible   GO  Angela Nevin, MA, CCC-SLP Speech Therapy [facility] Acute Rehab Pager: [phone]

## 2018-09-01 NOTE — Progress Notes (Signed)
   Patient currently under neurology care..... Remains in neuro ICU  Scheduled for image-guided diagnostic cerebral arteriogram on 09/01/2018 in IR   Please see full hospitalist note dated 08/31/2018  Acute Lt cerebral CVA--- status post TPA on 08/31/2018, transferred to neurology service on 08/31/2018, patient transferred to neuro ICU on 08/31/2018, discussed with Dr. Laurence Slate.... Plan is for cerebral angiogram on 09/01/2018 due to more than 90% stenosis of the left ICA,   Hospitalist service has signed off  Neurology service will recall hospitalist service if needed in the future  Attending of record currently Dr. Yvone Neu, MD

## 2018-09-01 NOTE — Sedation Documentation (Signed)
Pressure released, dressing applied by IR tech, distal pulses assessed

## 2018-09-01 NOTE — Progress Notes (Signed)
OT Cancellation Note  Patient Details Name: Emily Daniel MRN: [id number] DOB: 21-Nov-1929   Cancelled Treatment:    Reason Eval/Treat Not Completed: Patient at procedure or test/ unavailable(IR. Will return as schedule allows. Thank you. )  Rinnah Peppel M Lametria Klunk Chenell Lozon MSOT, OTR/L Acute Rehab Pager: [phone] Office: [phone] 09/01/2018, 10:47 AM

## 2018-09-01 NOTE — Progress Notes (Signed)
ANTICOAGULATION CONSULT NOTE  Pharmacy Consult for Heparin Indication: pulmonary embolus  Allergies  Allergen Reactions  . Meperidine Hcl Nausea And Vomiting  . Sulfonamide Derivatives Nausea And Vomiting  . Demerol [Meperidine]     Unknown per pt     Patient Measurements: Height: 5' (152.4 cm) Weight: 125 lb 3.5 oz (56.8 kg) IBW/kg (Calculated) : 45.5  Vital Signs: Temp: 97.9 F (36.6 C) (04/07 1600) Temp Source: Oral (04/07 1600) BP: 164/68 (04/07 1800) Pulse Rate: 93 (04/07 1800)  Labs: Recent Labs    08/30/18 0624 08/31/18 0639 08/31/18 1125 08/31/18 1804 09/01/18 1840  HGB 9.2*  --  9.9*  --   --   HCT 28.3*  --  30.1*  --   --   PLT 219  --  237  --   --   LABPROT  --   --  16.0*  --   --   INR  --   --  1.3*  --   --   HEPARINUNFRC  --   --   --   --  0.18*  CREATININE 1.07*  --  1.13*  --   --   TROPONINI  --  <0.03 <0.03 <0.03  --     Estimated Creatinine Clearance: 28.2 mL/min (A) (by C-G formula based on SCr of 1.13 mg/dL (H)).  Assessment: 85 year old female with a history of Afib not on anti-coagulation prior to admission admitted with new CVA.  Also, found to have a pulmonary embolism.  She received TPA 08/31/18 at 8 am.  Pharmacy consulted to begin heparin 24 hours post TPA administration with low heparin goal and no bolus.  Currently on IV heparin at 650 units/hr and initial heparin level is subtherapeutic at 0.18. Of note, patient is also on dual antiplatelet therapy with aspirin and ticagrelor. RN reports no s/s of overt bleeding.   Goal of Therapy:  Heparin level = 0.3-0.5 Monitor platelets by anticoagulation protocol: Yes   Plan:  Increase heparin drip to 750 units / hr  F/u 8 hr HL  Daily heparin level, CBC  Vinnie Level, PharmD., BCPS Clinical Pharmacist Clinical phone for 09/01/18 until 10:30pm: [phone] If after 10:30pm, please refer to [facility] for unit-specific pharmacist

## 2018-09-02 ENCOUNTER — Inpatient Hospital Stay (HOSPITAL_COMMUNITY): Payer: Medicare Other

## 2018-09-02 ENCOUNTER — Inpatient Hospital Stay (HOSPITAL_COMMUNITY): Payer: Medicare Other | Admitting: Anesthesiology

## 2018-09-02 ENCOUNTER — Encounter: Payer: Self-pay | Admitting: *Deleted

## 2018-09-02 ENCOUNTER — Other Ambulatory Visit (HOSPITAL_COMMUNITY): Payer: Medicare Other

## 2018-09-02 ENCOUNTER — Inpatient Hospital Stay (HOSPITAL_COMMUNITY): Payer: Medicare Other | Admitting: Certified Registered Nurse Anesthetist

## 2018-09-02 ENCOUNTER — Encounter (HOSPITAL_COMMUNITY)
Admission: EM | Disposition: A | Discharge status: Hospice | Payer: Self-pay | Source: Home / Self Care | Attending: Neurology

## 2018-09-02 LAB — CBC
HCT: 29.8 % — ABNORMAL LOW (ref 36.0–46.0)
Hemoglobin: 9.9 g/dL — ABNORMAL LOW (ref 12.0–15.0)
MCH: 34 pg (ref 26.0–34.0)
MCHC: 33.2 g/dL (ref 30.0–36.0)
MCV: 102.4 fL — ABNORMAL HIGH (ref 80.0–100.0)
Platelets: 298 10*3/uL (ref 150–400)
RBC: 2.91 MIL/uL — ABNORMAL LOW (ref 3.87–5.11)
RDW: 12.1 % (ref 11.5–15.5)
WBC: 14.2 10*3/uL — ABNORMAL HIGH (ref 4.0–10.5)
nRBC: 0 % (ref 0.0–0.2)

## 2018-09-02 LAB — BASIC METABOLIC PANEL
Anion gap: 9 (ref 5–15)
BUN: 16 mg/dL (ref 8–23)
CO2: 20 mmol/L — ABNORMAL LOW (ref 22–32)
Calcium: 9 mg/dL (ref 8.9–10.3)
Chloride: 109 mmol/L (ref 98–111)
Creatinine, Ser: 0.98 mg/dL (ref 0.44–1.00)
GFR calc Af Amer: >60 mL/min (ref >60)
GFR calc non Af Amer: 52 mL/min — ABNORMAL LOW (ref >60)
Glucose, Bld: 150 mg/dL — ABNORMAL HIGH (ref 70–99)
Potassium: 3.9 mmol/L (ref 3.5–5.1)
Sodium: 138 mmol/L (ref 135–145)

## 2018-09-02 LAB — URINALYSIS, COMPLETE (UACMP) WITH MICROSCOPIC
Bacteria, UA: NONE SEEN
Bilirubin Urine: NEGATIVE
Glucose, UA: NEGATIVE mg/dL
Ketones, ur: 15 mg/dL — AB
Leukocytes,Ua: NEGATIVE
Nitrite: NEGATIVE
Protein, ur: NEGATIVE mg/dL
Specific Gravity, Urine: 1.015 (ref 1.005–1.030)
pH: 5.5 (ref 5.0–8.0)

## 2018-09-02 LAB — GLUCOSE, CAPILLARY
Glucose-Capillary: 126 mg/dL — ABNORMAL HIGH (ref 70–99)
Glucose-Capillary: 132 mg/dL — ABNORMAL HIGH (ref 70–99)
Glucose-Capillary: 117 mg/dL — ABNORMAL HIGH (ref 70–99)
Glucose-Capillary: 141 mg/dL — ABNORMAL HIGH (ref 70–99)

## 2018-09-02 LAB — HEPARIN LEVEL (UNFRACTIONATED)
Heparin Unfractionated: 0.4 IU/mL (ref 0.30–0.70)
Heparin Unfractionated: 0.4 IU/mL (ref 0.30–0.70)

## 2018-09-02 LAB — PLATELET INHIBITION P2Y12: Platelet Function  P2Y12: 112 [PRU] — ABNORMAL LOW (ref 182–335)

## 2018-09-02 SURGERY — CT WITH ANESTHESIA
Anesthesia: General | Laterality: Left

## 2018-09-02 NOTE — Progress Notes (Signed)
STROKE TEAM PROGRESS NOTE   INTERVAL HISTORY Pt sitting in bed for breakfast. Her procedure is postponed due to leukocytosis. Will check CXR and UA again. Afebrile. Still has right hemiparesis and aphasia but improving.   Vitals:   09/02/18 0400 09/02/18 0500 09/02/18 0600 09/02/18 0700  BP: (!) 143/59 (!) 144/54 (!) 155/61 (!) 156/58  Pulse: 73 87 81 76  Resp: 20 18 14 17   Temp: 98.2 F (36.8 C)     TempSrc: Oral     SpO2: 97% 98% 99% 100%  Weight:      Height:        CBC:  Recent Labs  Lab 08/29/18 1411  08/31/18 1125 09/02/18 0240  WBC 7.8   < > 12.3* 14.2*  NEUTROABS 4.9  --   --   --   HGB 9.9*   < > 9.9* 9.9*  HCT 29.2*   < > 30.1* 29.8*  MCV 102.1*   < > 100.7* 102.4*  PLT 239   < > 237 298   < > = values in this interval not displayed.    Basic Metabolic Panel:  Recent Labs  Lab 08/31/18 1125 09/02/18 0240  NA 141 138  K 3.9 3.9  CL 110 109  CO2 21* 20*  GLUCOSE 161* 150*  BUN 16 16  CREATININE 1.13* 0.98  CALCIUM 9.0 9.0   Lipid Panel:     Component Value Date/Time   CHOL 134 09/01/2018 0308   TRIG 85 09/01/2018 0308   HDL 38 (L) 09/01/2018 0308   CHOLHDL 3.5 09/01/2018 0308   VLDL 17 09/01/2018 0308   LDLCALC 79 09/01/2018 0308   HgbA1c:  Lab Results  Component Value Date   HGBA1C 5.9 (H) 09/01/2018   Urine Drug Screen:     Component Value Date/Time   LABOPIA NONE DETECTED 08/25/2018 1945   COCAINSCRNUR NONE DETECTED 08/25/2018 1945   LABBENZ NONE DETECTED 08/25/2018 1945   AMPHETMU NONE DETECTED 08/25/2018 1945   THCU NONE DETECTED 08/25/2018 1945   LABBARB NONE DETECTED 08/25/2018 1945    Alcohol Level     Component Value Date/Time   ETH <10 08/25/2018 1736    IMAGING Mr Brain Wo Contrast  Result Date: 09/01/2018 CLINICAL DATA:  Focal neuro deficit. EXAM: MRI HEAD WITHOUT CONTRAST TECHNIQUE: Multiplanar, multiecho pulse sequences of the brain and surrounding structures were obtained without intravenous contrast. COMPARISON:   Head CT and CTA from yesterday FINDINGS: Brain: Overall moderate area of patchy acute infarction along the left more than right high cerebral convexities which could be embolic or watershed. There is a pre-existing smaller area of infarct at the high left frontal parietal convexity. There is petechial hemorrhage associated with a left-sided infarction. Patchy mainly peripheral remote microhemorrhages, suspicious for amyloid angiopathy. No acute hematoma, hydrocephalus, or masslike finding. Confluent chronic small vessel ischemic gliosis in the periventricular white matter. Small remote bilateral cerebellar infarcts. Vascular: Major flow voids are preserved.  There was CTA yesterday Skull and upper cervical spine: Negative for marrow lesion Sinuses/Orbits: Bilateral cataract resection IMPRESSION: 1. Moderate area of acute infarction along the left more than right high cerebral convexity, likely watershed. Mild petechial hemorrhage on the left 2. Background of extensive chronic small vessel ischemia in the cerebral white matter. 3. Scattered remote micro hemorrhages suspicious for amyloid angiopathy. Electronically Signed   By: Marnee Spring M.D.   On: 09/01/2018 05:56   Dg Chest Port 1 View  Result Date: 09/02/2018 CLINICAL DATA:  Leukocytosis EXAM: PORTABLE  CHEST 1 VIEW COMPARISON:  09/01/2018 FINDINGS: Cardiac shadow remains enlarged. Postsurgical changes are again. Aortic calcifications are again seen. The lungs are well aerated bilaterally. No focal infiltrate or sizable effusion is seen. No acute bony abnormality is noted. IMPRESSION: No active disease. Electronically Signed   By: Alcide CleverMark  Lukens M.D.   On: 09/02/2018 09:12   EEG This is an abnormal EEG secondary to general background slowing.  This finding may be seen with a diffuse disturbance that is etiologically nonspecific, but may include a metabolic encephalopathy.  Can not rule out normal drowse as well.  No epileptiform activity was noted.    2D  Echocardiogram bubble  1. The left ventricle has normal systolic function, with an ejection fraction of 60-65%. The cavity size was normal. Left ventricular diastolic function could not be evaluated due to nondiagnostic images.  2. The right ventricle has normal systolc function. The cavity was normal. There is no increase in right ventricular wall thickness.  3. RVSP moderate-severely elevated, with range of 43 mmHg-55 mmHg if RA pressures is estimated bewteen 3-15 mmHg. IVC not assessed on today's exam.  4. A 30 mm Sorin memo 3D annuloplasty ring is present in the mitral position Dorris Fetch(H[place]rickson 11/11/2013). No dehiscence.  5. Mitral diastolic prosthetic gradient severely elevated, 10 mmHg (HR 83 bpm). Trivial mitral valve regurgitation. No pannus or thrombus noted on ring.  6. Tricuspid valve regurgitation is mild-moderate.  7. The aortic valve is abnormal Moderate calcification of the aortic valve. Aortic valve regurgitation is trivial by color flow Doppler.  8. Severly calcified aorta sinotubular junction, prominent on posterior portion of ST junction.  9. No intracardiac thrombi or masses were visualized. Saline contrast bubble study was negative, with no evidence of any interatrial shunt.   PHYSICAL EXAM  Temp:  [97.9 F (36.6 C)-98.6 F (37 C)] 98.2 F (36.8 C) (04/08 0400) Pulse Rate:  [69-94] 76 (04/08 0700) Resp:  [12-32] 17 (04/08 0700) BP: (129-209)/(50-88) 156/58 (04/08 0700) SpO2:  [94 %-100 %] 100 % (04/08 0700)  General - Well nourished, well developed, in no apparent distress.  Ophthalmologic - fundi not visualized due to noncooperation.  Cardiovascular - Regular rate and rhythm, not in afib.  Neuro - awake alert, eyes open, orientated to self age, month and place. She still only repeat sentences and not answer questions appropriately. Able to name 3/4. Paucity of speech. Blinking to visual threat to right, but able to attend to both sides, able to track bilaterally.  PERRL, EOMI. Right nasolabial fold mild flattening. LUE 4/5 and LLE 3/5, RUE 0/5 with increased muscle tone. RLE 2/5. DTR 1+ and right babinski. Sensation symmetrical, left hand FTN slow but no ataxia. Gait not tested.    ASSESSMENT/PLAN Ms. Natale MilchMary R Spahr is a 85 y.o. female with history of CAD S/P CABG, PAD, HFrEF, paroxysmal A. Fib. Not on anticoagulation, AVMs, and multiple areas of small frontal infarcts on MRI was admitted on 08/29/18 due to confusion and worsening mental status. Treated for UTI and cellulitis. In hospital, she developed CP w/o ST changes and treated w/ morphine. 1 hr later emesis. Then noted global aphasia, R facial and R hemiparesis.  Received IV tPA 08/31/2018 at 0756.  Stroke: Left MCA and bilateral ACA scattered infarcts, consistent with left ICA proximal high-grade stenosis (azygous ACAs) and AF not on AC  Code Stroke CT head acute on chronic high L infarcts. ASPECTS 8    CTA head & neck no ELVO. Severe, pre occlusive stenosis L ICA  origin. RUL PE.  Azygous ACAs coming out from left ICA terminal  CXR CM and vascular congestion  MRI  Moderate L>R high cerebral convexity watershed infarcts. Mild PH on the L. Small vessel disease. Micro hmgs suspicious for CAA.  2D Echo EF 60-65%. No source of embolus. No clot/mass. Bubble study neg.   Cerebral angio left ICA proximal 90%+ stenosis  EEG generalized background slowing. No sz.  LDL 79  HgbA1c 5.9  P2Y12 161   SCDs for VTE prophylaxis  aspirin 81 mg daily prior to admission, now on aspirin 81 mg daily and Brilinta 90 bid, continue heparin IV  Therapy recommendations:  pending   Disposition:  pending  Transfer back to [facility] when moves out of ICU  Chest Pain/ RUL PE  LE dopplers neg DVT  Received IV tPA 11/30/2018 for stroke  2D Echo EF 60-65%. No source of embolus. No clot/mass. Bubble study neg. Mult cardiac abnormalities.  On heparin IV, will transition to po on discharge  Atrial  Fibrillation  Home anticoagulation:  none   family not eager to start [facility] at time of admission . Currently in NSR . On metoprolol  . On heparin IV, will transition to po on discharge   Carotid Stenosis  CTA L ICA 90% stenosis  Confirmed on cerebral angio  On ASA and brilinta  P2Y12 112  Pending left ICA stenting  Leukocytosis  UTI with enterococcus faecalis  LLE cellulitis   WBC 7.5->12.3->14.2  On amoxicillin 500 q 8h & doxycycline 100 bid po  UA 08/29/18 neg  Repeat UA today  CXR 09/02/18 on active disease  Hypertension  BP on the high end  Home meds:  Lisinopril 40, metoprolol 50 bid Treated w/ labetolol and cleviprex to keep in goal Wean off cleviprex as able  . Resume po meds after passing swallow . Long-term BP goal normotensive  Hyperlipidemia  Home meds:  pravachol 40, resumed in hospital  LDL 79, goal < 70  Continue pravastatin 40  Continue statin at discharge  Diabetes type II Controlled w/ PVD  Home meds:  januvia 100  HgbA1c 5.9, goal < 7.0  CBGs  SSI  Close PCP follow up  Other Stroke Risk Factors  Advanced age  Former Cigarette smoker, quit 10 y ago  Family hx stroke (other)  Coronary artery disease s/p NSTEMI, CABG  Chronic systolic Congestive heart failure  Ischemic cardiomyopathy   Mitral regurg s/p repair at time of CABG  PVD  Other Active Problems  LLE cellulitis. LE dopplers neg for DVT. On doxycycline 100 bid po  Renal insufficiency Cr 1.07->1.13->0.98  Hospital day # 2  This patient is critically ill due to stroke status post TPA, left carotid high-grade stenosis, A. fib not on AC, PE and at significant risk of neurological worsening, death form recurrent stroke, hypertensive emergency, hemorrhagic conversion, heart failure, massive PE. This patient's care requires constant monitoring of vital signs, hemodynamics, respiratory and cardiac monitoring, review of multiple databases, neurological assessment,  discussion with family, other specialists and medical decision making of high complexity. I spent 35 minutes of neurocritical care time in the care of this patient.  Marvel Plan, MD PhD Stroke Neurology 09/02/2018 9:24 AM   To contact Stroke Continuity provider, please refer to [email or web address]. After hours, contact General Neurology

## 2018-09-02 NOTE — Progress Notes (Signed)
OT Cancellation Note  Patient Details Name: Emily Daniel MRN: [id number] DOB: 04-16-1930   Cancelled Treatment:    Reason Eval/Treat Not Completed: Patient at procedure or test/ unavailable(Surgery. Will return as schedule allows. Thank you.)  Sudeep Scheibel M Chia Mowers Anoushka Divito MSOT, OTR/L Acute Rehab Pager: [phone] Office: [phone] 09/02/2018, 7:34 AM

## 2018-09-02 NOTE — Evaluation (Signed)
Physical Therapy Re- Evaluation Patient Details Name: Emily Daniel MRN: [id number] DOB: 1929/10/20 Today's Date: 09/02/2018   History of Present Illness  Pt is an 85 y/o female admitted 3/31 with tick bites with garbled speech, sent home, return 4/4 with LLE edema, confusion, UTI. Pt with acute aphasia and Rt Hemiplegia 4/6 with acute left cerebral infarct s/p tPA and IR.  PMHx: PVD, osteoporosis, HTN, DM II, chronic systolic heart failure, CHF, CAD, AV malformation, multiple areas of old small frontal infarctions on MRI, CABG 1990,1995,1998,2015.   Clinical Impression  Pt re-evaluated in setting of new acute left cerebral infarct. Pt presenting with decreased functional mobility secondary to right sided weakness, decreased range of motion, cognitive deficits, receptive aphasia, and poor balance. Pt with some noted RLE extensor tone. Performing stand pivot transfers with two person maximal assistance. Unable to weight shift or initiate pre gait training at this time. Recommending CIR to maximize functional independence and decrease caregiver burden.    Follow Up Recommendations CIR    Equipment Recommendations  Other (comment)(TBD)    Recommendations for Other Services Rehab consult     Precautions / Restrictions Precautions Precautions: Fall Restrictions Weight Bearing Restrictions: No      Mobility  Bed Mobility Overal bed mobility: Needs Assistance Bed Mobility: Supine to Sit;Sit to Supine     Supine to sit: Max assist Sit to supine: Max assist   General bed mobility comments: MaxA provided for all bed mobility. Needs physical assistance for initiation  Transfers Overall transfer level: Needs assistance Equipment used: 2 person hand held assist Transfers: Sit to/from Stand;Stand Pivot Transfers Sit to Stand: Max assist Stand pivot transfers: Max assist;+2 physical assistance       General transfer comment: MaxA + 2 for stand pivot towards both right and left. Right  knee blocked for stability. Tactile/verbal cues for trunk and hip extension  Ambulation/Gait                Stairs            Wheelchair Mobility    Modified Rankin (Stroke Patients Only) Modified Rankin (Stroke Patients Only) Pre-Morbid Rankin Score: Severe disability Modified Rankin: Severe disability     Balance Overall balance assessment: Needs assistance Sitting-balance support: Feet supported;Single extremity supported Sitting balance-Leahy Scale: Poor Sitting balance - Comments: Initially requiring totalA for sitting balance but able to progress to min assist with LUE supported  Postural control: Right lateral lean   Standing balance-Leahy Scale: Zero                               Pertinent Vitals/Pain Pain Assessment: No/denies pain    Home Living Family/patient expects to be discharged to:: Private residence Living Arrangements: Alone   Type of Home: House Home Access: Stairs to enter   [employer] CorporationEntrance Stairs-Number of Steps: 1 Home Layout: One level Home Equipment: Cane - single point;Walker - 2 wheels Additional Comments: Home set up obtained from previous admission    Prior Function Level of Independence: Needs assistance   Gait / Transfers Assistance Needed: unclear  ADL's / Homemaking Assistance Needed: has been sponge bathing        Hand Dominance   Dominant Hand: Right    Extremity/Trunk Assessment   Upper Extremity Assessment Upper Extremity Assessment: Defer to OT evaluation    Lower Extremity Assessment Lower Extremity Assessment: RLE deficits/detail;LLE deficits/detail RLE Deficits / Details: Difficult to assess due to cognition. Some  extensor tone noted. Able to perform seated LAQ. Ankle dorsiflexion limited ~10 deg from neutral LLE Deficits / Details: Generalized weakness. Ankle dorsiflexion limited ~10-15 degrees from neutral    Cervical / Trunk Assessment Cervical / Trunk Assessment: Kyphotic  Communication    Communication: Receptive difficulties  Cognition Arousal/Alertness: Awake/alert Behavior During Therapy: WFL for tasks assessed/performed Overall Cognitive Status: Impaired/Different from baseline Area of Impairment: Memory;Following commands;Safety/judgement;Awareness;Problem solving                     Memory: Decreased short-term memory Following Commands: Follows one step commands consistently;Follows one step commands with increased time;Follows multi-step commands inconsistently Safety/Judgement: Decreased awareness of safety;Decreased awareness of deficits Awareness: Intellectual Problem Solving: Slow processing;Decreased initiation;Difficulty sequencing;Requires verbal cues;Requires tactile cues General Comments: Seems confused as to relevance of questions being asked and often repeats phrases      General Comments      Exercises     Assessment/Plan    PT Assessment Patient needs continued PT services  PT Problem List Decreased strength;Decreased activity tolerance;Decreased range of motion;Decreased balance;Decreased mobility;Decreased knowledge of use of DME;Decreased cognition;Decreased safety awareness;Decreased knowledge of precautions;Pain       PT Treatment Interventions DME instruction;Gait training;Stair training;Functional mobility training;Therapeutic activities;Therapeutic exercise;Neuromuscular re-education;Patient/family education    PT Goals (Current goals can be found in the Care Plan section)  Acute Rehab PT Goals Patient Stated Goal: "not spend money on the hospital." PT Goal Formulation: With patient Time For Goal Achievement: 09/16/18 Potential to Achieve Goals: Good    Frequency Min 4X/week   Barriers to discharge Decreased caregiver support      Co-evaluation PT/OT/SLP Co-Evaluation/Treatment: Yes Reason for Co-Treatment: For patient/therapist safety;To address functional/ADL transfers;Necessary to address cognition/behavior during  functional activity;Complexity of the patient's impairments (multi-system involvement) PT goals addressed during session: Mobility/safety with mobility         AM-PAC PT "6 Clicks" Mobility  Outcome Measure Help needed turning from your back to your side while in a flat bed without using bedrails?: A Lot Help needed moving from lying on your back to sitting on the side of a flat bed without using bedrails?: Total Help needed moving to and from a bed to a chair (including a wheelchair)?: Total Help needed standing up from a chair using your arms (e.g., wheelchair or bedside chair)?: Total Help needed to walk in hospital room?: Total Help needed climbing 3-5 steps with a railing? : Total 6 Click Score: 7    End of Session Equipment Utilized During Treatment: Gait belt Activity Tolerance: Patient tolerated treatment well Patient left: in bed;with call bell/phone within reach;with bed alarm set Nurse Communication: Mobility status PT Visit Diagnosis: Muscle weakness (generalized) (M62.81);Difficulty in walking, not elsewhere classified (R26.2)    Time: 7076-1518 PT Time Calculation (min) (ACUTE ONLY): 42 min   Charges:   PT Evaluation $PT Eval Moderate Complexity: 1 Mod PT Treatments $Therapeutic Activity: 8-22 mins        Laurina Bustle, PT, DPT Acute Rehabilitation Services Pager [phone] Office [phone]   [name] 09/02/2018, 1:17 PM

## 2018-09-02 NOTE — Progress Notes (Signed)
  Speech Language Pathology Treatment: Cognitive-Linquistic  Patient Details Name: Emily Daniel MRN: [id number] DOB: 14-Mar-1930 Today's Date: 09/02/2018 Time: 2094-7096 SLP Time Calculation (min) (ACUTE ONLY): 15 min  Assessment / Plan / Recommendation Clinical Impression  Patient seen to address cognitive and aphasia goals but session was shortened secondary to patient refusing to fully participate. Per RN, patient has h/o not liking medical interventions and she had an in/out catheterization which was apparently difficult, resulting in patient now being irritable. When SLP said, "we have a lot of things to look at" patient replied, "we do NOT have a lot of things to look at". She continues to be echolalic and perseverative but will use different intonation/inflection, "the urologist was just here" was what she kept telling SLP, with perceived intention of declining SLP. Patient continues to exhibit cognitive impairments with following directions and responding to yes/no questions, and during today's session, she also exhibited refusal and poor participation    HPI HPI: [name] with PMH: anxiety, CAD s/p CABG, peripheral artery disease, CHF, DM, intermittent atrial fibrillation not on anticoagulation, AVM's, hyperlipidemia and prior tobacco abuse, and multiple areas of old small frontal infarctions on MRI, who presented to ED with confusion  with MR of brain showing moderate area of acute infarction left more than right at the high cerebral convexities which could be embolic or watershed.        SLP Plan  Continue with current plan of care       Recommendations    CIR consult vs. SNF                General recommendations: Rehab consult Follow up Recommendations: Inpatient Rehab;Skilled Nursing facility SLP Visit Diagnosis: Aphasia (R47.01);Cognitive communication deficit (G83.662) Plan: Continue with current plan of care       GO               Emily Nevin, MA, CCC-SLP Speech Therapy [facility] Acute Rehab Pager: [phone]

## 2018-09-02 NOTE — Progress Notes (Signed)
PT Cancellation Note  Patient Details Name: Emily Daniel MRN: [id number] DOB: Nov 27, 1929   Cancelled Treatment:    Reason Eval/Treat Not Completed: Patient at procedure or test/unavailable (surgery).  Laurina Bustle, PT, DPT Acute Rehabilitation Services Pager [phone] Office [phone]    [name] 09/02/2018, 8:10 AM

## 2018-09-02 NOTE — Evaluation (Signed)
Occupational Therapy Evaluation Patient Details Name: Emily Daniel MRN: [id number] DOB: [birth year] Today's Date: 09/02/2018    History of Present Illness Pt is an 85 y/o female admitted 3/31 with tick bites with garbled speech, sent home, return 4/4 with LLE edema, confusion, UTI. Pt with acute aphasia and Rt Hemiplegia 4/6 with acute left cerebral infarct s/p tPA and IR.  PMHx: PVD, osteoporosis, HTN, DM II, chronic systolic heart failure, CHF, CAD, AV malformation, multiple areas of old small frontal infarctions on MRI, CABG 1990,1995,1998,2015.    Clinical Impression   PTA, pt reporting she lived alone and performed ADLs. Per chart review of last admission, pt's son could stay with her and she performed BADLs. Pt currently requiring Max A for ADLs and Max A +2 for functional transfers. Pt presenting with right sided weakness, poor coordination, decreased balance, aphasia, and decreased cognition. Pt would benefit from further acute OT to facilitate safe dc. Recommend dc to CIR for intensive OT to optimize safety, independence with ADLs, and return to PLOF.      Follow Up Recommendations  CIR;Supervision/Assistance - 24 hour    Equipment Recommendations  Other (comment)(Defer to next venue)    Recommendations for Other Services Rehab consult;PT consult;Speech consult     Precautions / Restrictions Precautions Precautions: Fall Restrictions Weight Bearing Restrictions: No      Mobility Bed Mobility Overal bed mobility: Needs Assistance Bed Mobility: Supine to Sit;Sit to Supine     Supine to sit: Max assist Sit to supine: Max assist   General bed mobility comments: MaxA provided for all bed mobility. Needs physical assistance for initiation  Transfers Overall transfer level: Needs assistance Equipment used: 2 person hand held assist Transfers: Sit to/from Stand;Stand Pivot Transfers Sit to Stand: Max assist Stand pivot transfers: Max assist;+2 physical assistance        General transfer comment: MaxA + 2 for stand pivot towards both right and left. Right knee blocked for stability. Tactile/verbal cues for trunk and hip extension    Balance Overall balance assessment: Needs assistance Sitting-balance support: Feet supported;Single extremity supported Sitting balance-Leahy Scale: Poor Sitting balance - Comments: Initially requiring totalA for sitting balance but able to progress to min assist with LUE supported  Postural control: Right lateral lean   Standing balance-Leahy Scale: Zero                             ADL either performed or assessed with clinical judgement   ADL Overall ADL's : Needs assistance/impaired Eating/Feeding: NPO   Grooming: Maximal assistance;Sitting   Upper Body Bathing: Maximal assistance;Sitting   Lower Body Bathing: Maximal assistance;Sit to/from stand   Upper Body Dressing : Maximal assistance;Sitting   Lower Body Dressing: Maximal assistance;Sit to/from stand Lower Body Dressing Details (indicate cue type and reason): Pt unable to don socks due to decreased sitting balance and following of commands Toilet Transfer: Maximal assistance;+2 for physical assistance;+2 for safety/equipment;Stand-pivot(simulated to recliner)           Functional mobility during ADLs: Maximal assistance;+2 for physical assistance;+2 for safety/equipment General ADL Comments: Pt requiring Max A for ADLs and demosntrates decreased cognition, balance, strength, and activity tolerance     Vision Baseline Vision/History: Wears glasses Vision Assessment?: Vision impaired- to be further tested in functional context     Perception     Praxis      Pertinent Vitals/Pain Pain Assessment: Faces Faces Pain Scale: No hurt Pain Intervention(s): Monitored during  session     Hand Dominance Right   Extremity/Trunk Assessment Upper Extremity Assessment Upper Extremity Assessment: RUE deficits/detail RUE Deficits / Details:  Increased tone in RUE with difficulty performing PROM into extenion at hand, elbow, and shoulder. Decreased attention to RUE and pt not using in ADLs and mobility RUE Coordination: decreased fine motor;decreased gross motor   Lower Extremity Assessment Lower Extremity Assessment: Defer to PT evaluation RLE Deficits / Details: Difficult to assess due to cognition. Able to perform seated LAQ. Ankle dorsiflexion limited ~10 deg from neutral LLE Deficits / Details: Generalized weakness. Ankle dorsiflexion limited ~10-15 degrees from neutral   Cervical / Trunk Assessment Cervical / Trunk Assessment: Kyphotic   Communication Communication Communication: Receptive difficulties   Cognition Arousal/Alertness: Awake/alert Behavior During Therapy: WFL for tasks assessed/performed Overall Cognitive Status: Impaired/Different from baseline Area of Impairment: Memory;Following commands;Safety/judgement;Awareness;Problem solving                     Memory: Decreased short-term memory Following Commands: Follows one step commands consistently;Follows one step commands with increased time;Follows multi-step commands inconsistently Safety/Judgement: Decreased awareness of safety;Decreased awareness of deficits Awareness: Intellectual Problem Solving: Slow processing;Decreased initiation;Difficulty sequencing;Requires verbal cues;Requires tactile cues General Comments: Presenting with confusion. Answering questions ~50% of time. Demonstrating echolalia and repeated quetsions and phrases.    General Comments  VSS    Exercises     Shoulder Instructions      Home Living Family/patient expects to be discharged to:: Private residence Living Arrangements: Alone   Type of Home: House Home Access: Stairs to enter [employer] of Steps: 1   Home Layout: One level     Bathroom Shower/Tub: Chief Strategy Officer: Standard     Home Equipment: Cane - single point;Walker  - 2 wheels   Additional Comments: Home set up obtained from previous admission      Prior Functioning/Environment Level of Independence: Needs assistance  Gait / Transfers Assistance Needed: unclear ADL's / Homemaking Assistance Needed: has been sponge bathing            OT Problem List: Decreased strength;Decreased range of motion;Decreased activity tolerance;Impaired balance (sitting and/or standing);Decreased knowledge of use of DME or AE;Decreased knowledge of precautions;Impaired UE functional use;Pain;Decreased safety awareness;Decreased cognition;Impaired vision/perception;Decreased coordination      OT Treatment/Interventions: Self-care/ADL training;Therapeutic exercise;Energy conservation;DME and/or AE instruction;Therapeutic activities;Patient/family education    OT Goals(Current goals can be found in the care plan section) Acute Rehab OT Goals Patient Stated Goal: "not spend money on the hospital." OT Goal Formulation: With patient Time For Goal Achievement: 09/16/18 Potential to Achieve Goals: Good  OT Frequency: Min 3X/week   Barriers to D/C:            Co-evaluation PT/OT/SLP Co-Evaluation/Treatment: Yes Reason for Co-Treatment: Necessary to address cognition/behavior during functional activity;For patient/therapist safety;Complexity of the patient's impairments (multi-system involvement);To address functional/ADL transfers   OT goals addressed during session: ADL's and self-care      AM-PAC OT "6 Clicks" Daily Activity     Outcome Measure Help from another person eating meals?: Total Help from another person taking care of personal grooming?: A Lot Help from another person toileting, which includes using toliet, bedpan, or urinal?: A Lot Help from another person bathing (including washing, rinsing, drying)?: A Lot Help from another person to put on and taking off regular upper body clothing?: A Lot Help from another person to put on and taking off regular  lower body clothing?: A Lot 6 Click  Score: 11   End of Session Equipment Utilized During Treatment: Gait belt Nurse Communication: Mobility status  Activity Tolerance: Patient tolerated treatment well;Patient limited by fatigue Patient left: in chair;with call bell/phone within reach;with chair alarm set(with PT)  OT Visit Diagnosis: Unsteadiness on feet (R26.81);Other abnormalities of gait and mobility (R26.89);Muscle weakness (generalized) (M62.81);Other symptoms and signs involving cognitive function;Pain;Hemiplegia and hemiparesis Hemiplegia - Right/Left: Right Hemiplegia - dominant/non-dominant: (Unsure) Hemiplegia - caused by: Cerebral infarction Pain - part of body: (Generalized)                Time: 9604-5409 OT Time Calculation (min): 22 min Charges:  OT General Charges $OT Visit: 1 Visit OT Evaluation $OT Eval Moderate Complexity: 1 Mod  Emily Daniel MSOT, OTR/L Acute Rehab Pager: [phone] Office: [phone]  Emily Daniel 09/02/2018, 4:53 PM

## 2018-09-02 NOTE — Progress Notes (Signed)
ANTICOAGULATION CONSULT NOTE  Pharmacy Consult for Heparin Indication: pulmonary embolus  Allergies  Allergen Reactions  . Meperidine Hcl Nausea And Vomiting  . Sulfonamide Derivatives Nausea And Vomiting  . Demerol [Meperidine]     Unknown per pt     Patient Measurements: Height: 5' (152.4 cm) Weight: 125 lb 3.5 oz (56.8 kg) IBW/kg (Calculated) : 45.5  Vital Signs: Temp: 98.2 F (36.8 C) (04/08 0400) Temp Source: Oral (04/08 0400) BP: 157/50 (04/08 0200) Pulse Rate: 70 (04/08 0200)  Labs: Recent Labs    08/30/18 0624 08/31/18 0639 08/31/18 1125 08/31/18 1804 09/01/18 1840 09/02/18 0240  HGB 9.2*  --  9.9*  --   --  9.9*  HCT 28.3*  --  30.1*  --   --  29.8*  PLT 219  --  237  --   --  298  LABPROT  --   --  16.0*  --   --   --   INR  --   --  1.3*  --   --   --   HEPARINUNFRC  --   --   --   --  0.18* 0.40  CREATININE 1.07*  --  1.13*  --   --  0.98  TROPONINI  --  <0.03 <0.03 <0.03  --   --     Estimated Creatinine Clearance: 32.5 mL/min (by C-G formula based on SCr of 0.98 mg/dL).  Assessment: 85 year old female with a history of Afib not on anti-coagulation prior to admission admitted with new CVA.  Also, found to have a pulmonary embolism.  She received TPA 08/31/18 at 8 am.  Pharmacy consulted to begin heparin 24 hours post TPA administration with low heparin goal and no bolus.  Currently on IV heparin at  750 units/hr. Heparin level is therapeutic 0.40 units/ml Of note, patient is also on dual antiplatelet therapy with aspirin and ticagrelor.  Goal of Therapy:  Heparin level = 0.3-0.5 Monitor platelets by anticoagulation protocol: Yes   Plan:  Continue heparin drip at 750 units / hr  F/u 8 hr HL to confirm Daily heparin level, CBC  Thanks for allowing pharmacy to be a part of this patient's care.  Talbert Cage, PharmD Clinical Pharmacist

## 2018-09-02 NOTE — Progress Notes (Signed)
ANTICOAGULATION CONSULT NOTE - Follow Up Consult  Pharmacy Consult for Heparin Indication: pulmonary embolus  Allergies  Allergen Reactions  . Meperidine Hcl Nausea And Vomiting  . Sulfonamide Derivatives Nausea And Vomiting  . Demerol [Meperidine]     Unknown per pt     Patient Measurements: Height: 5' (152.4 cm) Weight: 125 lb 3.5 oz (56.8 kg) IBW/kg (Calculated) : 45.5 Heparin Dosing Weight: 56.8 kg  Vital Signs: Temp: 98.3 F (36.8 C) (04/08 1200) Temp Source: Oral (04/08 1200) BP: 148/46 (04/08 1500) Pulse Rate: 71 (04/08 1500)  Labs: Recent Labs    08/31/18 0639 08/31/18 1125 08/31/18 1804 09/01/18 1840 09/02/18 0240 09/02/18 1314  HGB  --  9.9*  --   --  9.9*  --   HCT  --  30.1*  --   --  29.8*  --   PLT  --  237  --   --  298  --   LABPROT  --  16.0*  --   --   --   --   INR  --  1.3*  --   --   --   --   HEPARINUNFRC  --   --   --  0.18* 0.40 0.40  CREATININE  --  1.13*  --   --  0.98  --   TROPONINI <0.03 <0.03 <0.03  --   --   --     Estimated Creatinine Clearance: 32.5 mL/min (by C-G formula based on SCr of 0.98 mg/dL).  Assessment:  85 year old female with a history of Afib not on anti-coagulation prior to admission admitted with new CVA.  Also, found to have a pulmonary embolism.  She received TPA 08/31/18 at 8 am.  Pharmacy consulted to begin heparin 24 hours post TPA administration with low heparin goal and no bolus.  Of note, patient is also on dual antiplatelet therapy with aspirin 81 mg and ticagrelor 90 mg BID.    To IR this am for procedure, but procedure postponed due to WBC up 14.2.   No known interruptions to heparin infusion.    Heparin level remains therapeutic (0.40) this afternoon on 750 units/hr.   Goal of Therapy:  Heparin level 0.3-0.5 units/ml Monitor platelets by anticoagulation protocol: Yes   Plan:   Continue heparin drip at 750 units/hr  Daily heparin level and CBC while on heparin.  Will follow up post-procedure 4/9  for anticoagulation plans.  Dennie Fetters, RPh Pager: [phone] or phone: [phone] 09/02/2018,3:35 PM

## 2018-09-02 NOTE — Anesthesia Preprocedure Evaluation (Signed)
Anesthesia Evaluation  Patient identified by MRN, date of birth, ID band Patient awake    Reviewed: Allergy & Precautions, H&P , NPO status , Patient's Chart, lab work & pertinent test results  Airway Mallampati: II   Neck ROM: full    Dental   Pulmonary former smoker,    breath sounds clear to auscultation       Cardiovascular hypertension, + CAD, + Past MI, + CABG, + Peripheral Vascular Disease and +CHF   Rhythm:regular Rate:Normal  S/p mitral valve repair at time of CABG.  Mitral valve is now stenotic with gradient of 10.   Neuro/Psych PSYCHIATRIC DISORDERS Anxiety CVA    GI/Hepatic   Endo/Other  diabetes, Type 2  Renal/GU      Musculoskeletal   Abdominal   Peds  Hematology  (+) Blood dyscrasia, anemia ,   Anesthesia Other Findings   Reproductive/Obstetrics                             Anesthesia Physical Anesthesia Plan  ASA: III  Anesthesia Plan: General   Post-op Pain Management:    Induction: Intravenous  PONV Risk Score and Plan: 3 and Ondansetron, Dexamethasone and Treatment may vary due to age or medical condition  Airway Management Planned: Oral ETT  Additional Equipment: Arterial line  Intra-op Plan:   Post-operative Plan: Extubation in OR  Informed Consent: I have reviewed the patients History and Physical, chart, labs and discussed the procedure including the risks, benefits and alternatives for the proposed anesthesia with the patient or authorized representative who has indicated his/her understanding and acceptance.       Plan Discussed with: CRNA, Anesthesiologist and Surgeon  Anesthesia Plan Comments:         Anesthesia Quick Evaluation

## 2018-09-02 NOTE — Progress Notes (Signed)
Patient was scheduled for an image-guided cerebral arteriogram with possible revascularization of proximal right ICA stenosis this AM with Dr. Corliss Skains.  CBC this AM revealed WBC 14.2 (up from 12.3). Per Dr. Corliss Skains, unsafe to proceed with procedure until infection source known. Procedure to be rescheduled for tomorrow. Called patient's son, Molly Maduro, and he is aware of plan.  Plan for procedure tomorrow. Check UA today and CBC with differential in AM. Please call IR with questions/concerns.  Waylan Boga Deion Swift, PA-C 09/02/2018, 9:34 AM

## 2018-09-02 NOTE — Progress Notes (Signed)
Tele-visit facilitated by e-Link with the son's and daughter.

## 2018-09-02 NOTE — Anesthesia Preprocedure Evaluation (Deleted)
Anesthesia Evaluation  Patient identified by MRN, date of birth, ID band Patient awake    Reviewed: Allergy & Precautions, NPO status , Patient's Chart, lab work & pertinent test results  Airway Mallampati: II  TM Distance: >3 FB Neck ROM: Full    Dental no notable dental hx.    Pulmonary neg pulmonary ROS, former smoker,    Pulmonary exam normal breath sounds clear to auscultation       Cardiovascular hypertension, negative cardio ROS Normal cardiovascular exam Rhythm:Regular Rate:Normal     Neuro/Psych negative neurological ROS  negative psych ROS   GI/Hepatic negative GI ROS, Neg liver ROS,   Endo/Other  negative endocrine ROSdiabetes  Renal/GU negative Renal ROS  negative genitourinary   Musculoskeletal negative musculoskeletal ROS (+)   Abdominal   Peds negative pediatric ROS (+)  Hematology negative hematology ROS (+)   Anesthesia Other Findings   Reproductive/Obstetrics negative OB ROS                                                             Anesthesia Evaluation  Patient identified by MRN, date of birth, ID band Patient awake    Reviewed: Allergy & Precautions, H&P , NPO status , Patient's Chart, lab work & pertinent test results  Airway Mallampati: II   Neck ROM: full    Dental   Pulmonary former smoker,    breath sounds clear to auscultation       Cardiovascular hypertension, + CAD, + Past MI, + CABG, + Peripheral Vascular Disease and +CHF   Rhythm:regular Rate:Normal  S/p mitral valve repair at time of CABG.  Mitral valve is now stenotic with gradient of 10.   Neuro/Psych PSYCHIATRIC DISORDERS Anxiety CVA    GI/Hepatic   Endo/Other  diabetes, Type 2  Renal/GU      Musculoskeletal   Abdominal   Peds  Hematology  (+) Blood dyscrasia, anemia ,   Anesthesia Other Findings   Reproductive/Obstetrics                              Anesthesia Physical Anesthesia Plan  ASA: III  Anesthesia Plan: General   Post-op Pain Management:    Induction: Intravenous  PONV Risk Score and Plan: 3 and Ondansetron, Dexamethasone and Treatment may vary due to age or medical condition  Airway Management Planned: Oral ETT  Additional Equipment: Arterial line  Intra-op Plan:   Post-operative Plan: Extubation in OR  Informed Consent: I have reviewed the patients History and Physical, chart, labs and discussed the procedure including the risks, benefits and alternatives for the proposed anesthesia with the patient or authorized representative who has indicated his/her understanding and acceptance.       Plan Discussed with: CRNA, Anesthesiologist and Surgeon  Anesthesia Plan Comments:         Anesthesia Quick Evaluation  Anesthesia Physical Anesthesia Plan  ASA: III  Anesthesia Plan: MAC   Post-op Pain Management:    Induction: Intravenous  PONV Risk Score and Plan:   Airway Management Planned: Mask and Natural Airway  Additional Equipment:   Intra-op Plan:   Post-operative Plan:   Informed Consent:   Plan Discussed with:   Anesthesia Plan Comments:         Anesthesia  Quick Evaluation

## 2018-09-02 NOTE — Anesthesia Procedure Notes (Signed)
Arterial Line Insertion Start/End4/12/2018 8:00 AM, 09/02/2018 8:10 AM Performed by: Adonis Housekeeper, CRNA  Patient location: Pre-op. Preanesthetic checklist: patient identified, IV checked, site marked, risks and benefits discussed, surgical consent, monitors and equipment checked, pre-op evaluation and anesthesia consent Lidocaine 1% used for infiltration Left, radial was placed Catheter size: 20 G Hand hygiene performed , maximum sterile barriers used  and Seldinger technique used Allen's test indicative of satisfactory collateral circulation Attempts: 1 Procedure performed without using ultrasound guided technique. Following insertion, dressing applied and Biopatch. Post procedure assessment: normal

## 2018-09-03 ENCOUNTER — Other Ambulatory Visit (HOSPITAL_COMMUNITY): Payer: Medicare Other

## 2018-09-03 ENCOUNTER — Encounter (HOSPITAL_COMMUNITY)
Admission: EM | Disposition: A | Discharge status: Hospice | Payer: Self-pay | Source: Home / Self Care | Attending: Neurology

## 2018-09-03 DIAGNOSIS — I63512 Cerebral infarction due to unspecified occlusion or stenosis of left middle cerebral artery: Secondary | ICD-10-CM

## 2018-09-03 DIAGNOSIS — I639 Cerebral infarction, unspecified: Secondary | ICD-10-CM

## 2018-09-03 DIAGNOSIS — G8191 Hemiplegia, unspecified affecting right dominant side: Secondary | ICD-10-CM

## 2018-09-03 DIAGNOSIS — R4701 Aphasia: Secondary | ICD-10-CM

## 2018-09-03 LAB — CBC WITH DIFFERENTIAL/PLATELET
Abs Immature Granulocytes: 0.1 10*3/uL — ABNORMAL HIGH (ref 0.00–0.07)
Basophils Absolute: 0 10*3/uL (ref 0.0–0.1)
Basophils Relative: 0 %
Eosinophils Absolute: 0.2 10*3/uL (ref 0.0–0.5)
Eosinophils Relative: 1 %
HCT: 23.2 % — ABNORMAL LOW (ref 36.0–46.0)
Hemoglobin: 7.5 g/dL — ABNORMAL LOW (ref 12.0–15.0)
Immature Granulocytes: 1 %
Lymphocytes Relative: 21 %
Lymphs Abs: 2.2 10*3/uL (ref 0.7–4.0)
MCH: 33 pg (ref 26.0–34.0)
MCHC: 32.3 g/dL (ref 30.0–36.0)
MCV: 102.2 fL — ABNORMAL HIGH (ref 80.0–100.0)
Monocytes Absolute: 1.1 10*3/uL — ABNORMAL HIGH (ref 0.1–1.0)
Monocytes Relative: 10 %
Neutro Abs: 7 10*3/uL (ref 1.7–7.7)
Neutrophils Relative %: 67 %
Platelets: 244 10*3/uL (ref 150–400)
RBC: 2.27 MIL/uL — ABNORMAL LOW (ref 3.87–5.11)
RDW: 12.6 % (ref 11.5–15.5)
WBC: 10.6 10*3/uL — ABNORMAL HIGH (ref 4.0–10.5)
nRBC: 0 % (ref 0.0–0.2)

## 2018-09-03 LAB — CBC
HCT: 22.2 % — ABNORMAL LOW (ref 36.0–46.0)
Hemoglobin: 7.1 g/dL — ABNORMAL LOW (ref 12.0–15.0)
MCH: 32.7 pg (ref 26.0–34.0)
MCHC: 32 g/dL (ref 30.0–36.0)
MCV: 102.3 fL — ABNORMAL HIGH (ref 80.0–100.0)
Platelets: 230 10*3/uL (ref 150–400)
RBC: 2.17 MIL/uL — ABNORMAL LOW (ref 3.87–5.11)
RDW: 12.4 % (ref 11.5–15.5)
WBC: 10 10*3/uL (ref 4.0–10.5)
nRBC: 0 % (ref 0.0–0.2)

## 2018-09-03 LAB — BASIC METABOLIC PANEL
Anion gap: 9 (ref 5–15)
BUN: 32 mg/dL — ABNORMAL HIGH (ref 8–23)
CO2: 20 mmol/L — ABNORMAL LOW (ref 22–32)
Calcium: 8.7 mg/dL — ABNORMAL LOW (ref 8.9–10.3)
Chloride: 111 mmol/L (ref 98–111)
Creatinine, Ser: 0.93 mg/dL (ref 0.44–1.00)
GFR calc Af Amer: >60 mL/min (ref >60)
GFR calc non Af Amer: 56 mL/min — ABNORMAL LOW (ref >60)
Glucose, Bld: 129 mg/dL — ABNORMAL HIGH (ref 70–99)
Potassium: 3.3 mmol/L — ABNORMAL LOW (ref 3.5–5.1)
Sodium: 140 mmol/L (ref 135–145)

## 2018-09-03 LAB — HEMOGLOBIN AND HEMATOCRIT, BLOOD
HCT: 27.7 % — ABNORMAL LOW (ref 36.0–46.0)
Hemoglobin: 9.2 g/dL — ABNORMAL LOW (ref 12.0–15.0)

## 2018-09-03 LAB — HEPARIN LEVEL (UNFRACTIONATED): Heparin Unfractionated: 0.48 IU/mL (ref 0.30–0.70)

## 2018-09-03 LAB — GLUCOSE, CAPILLARY
Glucose-Capillary: 127 mg/dL — ABNORMAL HIGH (ref 70–99)
Glucose-Capillary: 155 mg/dL — ABNORMAL HIGH (ref 70–99)
Glucose-Capillary: 200 mg/dL — ABNORMAL HIGH (ref 70–99)

## 2018-09-03 LAB — PREPARE RBC (CROSSMATCH)

## 2018-09-03 SURGERY — CT WITH ANESTHESIA
Anesthesia: Monitor Anesthesia Care | Laterality: Left

## 2018-09-03 MED ORDER — FUROSEMIDE 10 MG/ML IJ SOLN: 20.0000 mg | Freq: Once | INTRAMUSCULAR | Status: DC

## 2018-09-03 MED ORDER — SODIUM CHLORIDE 0.9% IV SOLUTION: Freq: Once | INTRAVENOUS | Status: DC

## 2018-09-03 MED ORDER — LABETALOL HCL 5 MG/ML IV SOLN
5.0000 mg | Freq: Once | INTRAVENOUS | Status: AC
  Administered 2018-09-03: 21:00:00 5 mg via INTRAVENOUS
  Filled 2018-09-03: qty 4

## 2018-09-03 MED ORDER — HYDRALAZINE HCL 20 MG/ML IJ SOLN
5.0000 mg | Freq: Once | INTRAMUSCULAR | Status: AC
  Administered 2018-09-03: 5 mg via INTRAVENOUS
  Filled 2018-09-03: qty 1

## 2018-09-03 NOTE — Progress Notes (Signed)
Patient was scheduled for an image-guided cerebral arteriogram with possible revascularization of proximal right ICA stenosis this AM with Dr. Corliss Skains.  CBC this AM revealed WBCs closer to normal limits, however hgb down to 7.5 (was 9.9 yesterday), repeat this AM 7.1. Per Dr. Corliss Skains, unsafe to proceed with procedure until source of bleeding known and corrected. Because of this, procedure will not occur today. Will plan for procedure when hgb stable. Dr. Roda Shutters aware.  Please call IR with questions/concerns.  Emily Boga Trinadee Verhagen, PA-C 09/03/2018, 8:28 AM

## 2018-09-03 NOTE — Progress Notes (Signed)
Rehab Admissions Coordinator Note:  Patient was screened by Clois Dupes for appropriateness for an Inpatient Acute Rehab Consult per Therapy recommendations.  At this time, we are recommending Inpatient Rehab consult.  Clois Dupes RN MSN 09/03/2018, 1:21 PM  I can be reached at [phone].

## 2018-09-03 NOTE — Progress Notes (Signed)
STROKE TEAM PROGRESS NOTE   INTERVAL HISTORY Pt neuro stable, lying in bed still has global aphasia but able to repeat. On heprine IV with DAPT, developed anemia with Hb down to 7.1. no obvious bleeding source. Abdomen soft, no tenderness, no vomiting or BM. Will check occult blood. Will give blood transfusion. D/c heparin and DAPT.   Vitals:   09/03/18 0400 09/03/18 0500 09/03/18 0600 09/03/18 0700  BP: 137/69 124/62 137/65 139/60  Pulse: 70 86 72 68  Resp: 20 17 13 17   Temp: 98.3 F (36.8 C)     TempSrc: Oral     SpO2: 94% 98% 100% 97%  Weight:      Height:        CBC:  Recent Labs  Lab 08/29/18 1411  09/02/18 0240 09/03/18 0453  WBC 7.8   < > 14.2* 10.6*  NEUTROABS 4.9  --   --  7.0  HGB 9.9*   < > 9.9* 7.5*  HCT 29.2*   < > 29.8* 23.2*  MCV 102.1*   < > 102.4* 102.2*  PLT 239   < > 298 244   < > = values in this interval not displayed.    Basic Metabolic Panel:  Recent Labs  Lab 09/02/18 0240 09/03/18 0453  NA 138 140  K 3.9 3.3*  CL 109 111  CO2 20* 20*  GLUCOSE 150* 129*  BUN 16 32*  CREATININE 0.98 0.93  CALCIUM 9.0 8.7*   Lipid Panel:     Component Value Date/Time   CHOL 134 09/01/2018 0308   TRIG 85 09/01/2018 0308   HDL 38 (L) 09/01/2018 0308   CHOLHDL 3.5 09/01/2018 0308   VLDL 17 09/01/2018 0308   LDLCALC 79 09/01/2018 0308   HgbA1c:  Lab Results  Component Value Date   HGBA1C 5.9 (H) 09/01/2018   Urine Drug Screen:     Component Value Date/Time   LABOPIA NONE DETECTED 08/25/2018 1945   COCAINSCRNUR NONE DETECTED 08/25/2018 1945   LABBENZ NONE DETECTED 08/25/2018 1945   AMPHETMU NONE DETECTED 08/25/2018 1945   THCU NONE DETECTED 08/25/2018 1945   LABBARB NONE DETECTED 08/25/2018 1945    Alcohol Level     Component Value Date/Time   ETH <10 08/25/2018 1736    IMAGING Mr Brain Wo Contrast  Result Date: 09/01/2018 CLINICAL DATA:  Focal neuro deficit. EXAM: MRI HEAD WITHOUT CONTRAST TECHNIQUE: Multiplanar, multiecho pulse  sequences of the brain and surrounding structures were obtained without intravenous contrast. COMPARISON:  Head CT and CTA from yesterday FINDINGS: Brain: Overall moderate area of patchy acute infarction along the left more than right high cerebral convexities which could be embolic or watershed. There is a pre-existing smaller area of infarct at the high left frontal parietal convexity. There is petechial hemorrhage associated with a left-sided infarction. Patchy mainly peripheral remote microhemorrhages, suspicious for amyloid angiopathy. No acute hematoma, hydrocephalus, or masslike finding. Confluent chronic small vessel ischemic gliosis in the periventricular white matter. Small remote bilateral cerebellar infarcts. Vascular: Major flow voids are preserved.  There was CTA yesterday Skull and upper cervical spine: Negative for marrow lesion Sinuses/Orbits: Bilateral cataract resection IMPRESSION: 1. Moderate area of acute infarction along the left more than right high cerebral convexity, likely watershed. Mild petechial hemorrhage on the left 2. Background of extensive chronic small vessel ischemia in the cerebral white matter. 3. Scattered remote micro hemorrhages suspicious for amyloid angiopathy. Electronically Signed   By: Marnee SpringJonathon  Watts M.D.   On: 09/01/2018 05:56  Dg Chest Port 1 View  Result Date: 09/02/2018 CLINICAL DATA:  Leukocytosis EXAM: PORTABLE CHEST 1 VIEW COMPARISON:  09/01/2018 FINDINGS: Cardiac shadow remains enlarged. Postsurgical changes are again. Aortic calcifications are again seen. The lungs are well aerated bilaterally. No focal infiltrate or sizable effusion is seen. No acute bony abnormality is noted. IMPRESSION: No active disease. Electronically Signed   By: Alcide Clever M.D.   On: 09/02/2018 09:12   EEG This is an abnormal EEG secondary to general background slowing.  This finding may be seen with a diffuse disturbance that is etiologically nonspecific, but may include a  metabolic encephalopathy.  Can not rule out normal drowse as well.  No epileptiform activity was noted.    2D Echocardiogram bubble  1. The left ventricle has normal systolic function, with an ejection fraction of 60-65%. The cavity size was normal. Left ventricular diastolic function could not be evaluated due to nondiagnostic images.  2. The right ventricle has normal systolc function. The cavity was normal. There is no increase in right ventricular wall thickness.  3. RVSP moderate-severely elevated, with range of 43 mmHg-55 mmHg if RA pressures is estimated bewteen 3-15 mmHg. IVC not assessed on today's exam.  4. A 30 mm Sorin memo 3D annuloplasty ring is present in the mitral position Dorris Fetch 11/11/2013). No dehiscence.  5. Mitral diastolic prosthetic gradient severely elevated, 10 mmHg (HR 83 bpm). Trivial mitral valve regurgitation. No pannus or thrombus noted on ring.  6. Tricuspid valve regurgitation is mild-moderate.  7. The aortic valve is abnormal Moderate calcification of the aortic valve. Aortic valve regurgitation is trivial by color flow Doppler.  8. Severly calcified aorta sinotubular junction, prominent on posterior portion of ST junction.  9. No intracardiac thrombi or masses were visualized. Saline contrast bubble study was negative, with no evidence of any interatrial shunt.   PHYSICAL EXAM  Temp:  [98.2 F (36.8 C)-98.3 F (36.8 C)] 98.3 F (36.8 C) (04/09 0400) Pulse Rate:  [66-95] 68 (04/09 0700) Resp:  [12-25] 17 (04/09 0700) BP: (109-148)/(46-90) 139/60 (04/09 0700) SpO2:  [94 %-100 %] 97 % (04/09 0700) Arterial Line BP: (126-181)/(40-71) 155/51 (04/09 0700)  General - Well nourished, well developed, in no apparent distress.  Ophthalmologic - fundi not visualized due to noncooperation.  Cardiovascular - Regular rate and rhythm, not in afib.  Neuro - awake alert, eyes open, she still only repeat sentences and not answer questions appropriately. Able to  name 2/4. Paucity of speech. Blinking to visual threat to right not to left, but no gaze deviation, able to attend to both sides, able to track bilaterally. PERRL, EOMI. Right nasolabial fold mild flattening. LUE 4/5 and LLE 3/5, RUE 0/5 with increased muscle tone. RLE 3-/5. DTR 1+ and right babinski. Sensation, coordination not cooperative and gait not tested.   ASSESSMENT/PLAN [name] is a 85 y.o. female with history of CAD S/P CABG, PAD, HFrEF, paroxysmal A. Fib. Not on anticoagulation, AVMs, and multiple areas of small frontal infarcts on MRI was admitted on 08/29/18 due to confusion and worsening mental status. Treated for UTI and cellulitis. In hospital, she developed CP w/o ST changes and treated w/ morphine. 1 hr later emesis. Then noted global aphasia, R facial and R hemiparesis.  Received IV tPA 08/31/2018 at 0756.  Stroke: Left MCA and bilateral ACA scattered infarcts, consistent with left ICA proximal high-grade stenosis (azygous ACAs) and AF not on AC  Code Stroke CT head acute on chronic high L infarcts. ASPECTS  8    CTA head & neck no ELVO. Severe, pre occlusive stenosis L ICA origin. RUL PE.  Azygous ACAs coming out from left ICA terminal  CXR CM and vascular congestion  MRI  Moderate L>R high cerebral convexity watershed infarcts. Mild PH on the L. Small vessel disease. Micro hmgs suspicious for CAA.  2D Echo EF 60-65%. No source of embolus. No clot/mass. Bubble study neg.   Cerebral angio left ICA proximal 90%+ stenosis  EEG generalized background slowing. No sz.  LDL 79  HgbA1c 5.9  P2Y12 161   SCDs for VTE prophylaxis  aspirin 81 mg daily prior to admission, now off heparin IV and DAPT due to severe anemia needing blood transfusion  Therapy recommendations:  pending   Disposition:  pending  Transfer back to [facility] when moves out of ICU  Chest Pain/ RUL PE  LE dopplers neg DVT  Received IV tPA 11/30/2018 for stroke  2D Echo EF 60-65%.  No source of embolus. No clot/mass. Bubble study neg. Mult cardiac abnormalities.  Was on heparin IV, but developed severe anemia, heparin IV discontinued  Atrial Fibrillation  Home anticoagulation:  none   family not eager to start [facility] at time of admission . Currently in NSR . On metoprolol  . Was on heparin IV, but developed severe anemia, heparin IV discontinued   Carotid Stenosis  CTA L ICA 90% stenosis  Confirmed on cerebral angio  Was on ASA and brilinta, but developed severe anemia, DAPT was discontinued  P2Y12 112  May need to consider left CEA instead of CAS given severe anemia with AC and DAPT  Leukocytosis, improving  UTI with enterococcus faecalis  LLE cellulitis   WBC 7.5->12.3->14.2->10.6  On amoxicillin 500 q 8h & doxycycline 100 bid po (last day today)  UA 08/29/18 neg  UA 09/02/18 neg  UCx 20K enterococcus faecalis  CXR 09/02/18 on active disease  Severe anemia  Hgb 11.9->10.3->9.9->7.5->7.1  PRBC with 2 units  No obvious source at this time  Check iron panel and B12 and FA  Continue iron po  Close monitoring  D/c heparin IV and DAPT  Check stool occult blood  Consider CEA in the future instead of CAS  Hypertension  BP stable  Home meds:  Lisinopril 40, metoprolol 50 bid Treated w/ labetolol and cleviprex to keep in goal off cleviprex now  . Resumed po meds  . Long-term BP goal normotensive  Hyperlipidemia  Home meds:  pravachol 40, resumed in hospital  LDL 79, goal < 70  Continue pravastatin 40  Continue statin at discharge  Diabetes type II Controlled w/ PVD  Home meds:  januvia 100  HgbA1c 5.9, goal < 7.0  CBGs  SSI  Close PCP follow up  Other Stroke Risk Factors  Advanced age  Former Cigarette smoker, quit 22 y ago  Family hx stroke (other)  Coronary artery disease s/p NSTEMI, CABG  Chronic systolic Congestive heart failure  Ischemic cardiomyopathy   Mitral regurg s/p repair at time of  CABG  PVD  Other Active Problems  LLE cellulitis. LE dopplers neg for DVT. On doxycycline 100 bid po  Renal insufficiency Cr 1.07->1.13->0.98->0.93  Hospital day # 3  This patient is critically ill due to stroke status post TPA, severe anemia needing PRBC, left carotid high-grade stenosis, A. fib not on AC, PE and at significant risk of neurological worsening, death form recurrent stroke, hypertensive emergency, hemorrhagic conversion, heart failure, massive PE. This patient's care requires constant monitoring of  vital signs, hemodynamics, respiratory and cardiac monitoring, review of multiple databases, neurological assessment, discussion with family, other specialists and medical decision making of high complexity. I had long discussion with son Molly Maduro over the phone, updated pt current condition, treatment plan and potential prognosis. He expressed understanding and appreciation. I spent 45 minutes of neurocritical care time in the care of this patient.   Marvel Plan, MD PhD Stroke Neurology 09/03/2018 8:00 AM   To contact Stroke Continuity provider, please refer to [email or web address]. After hours, contact General Neurology

## 2018-09-03 NOTE — Progress Notes (Signed)
Assisted tele visit to patient with son.  Chanler Schreiter Anne, RN  

## 2018-09-03 NOTE — Progress Notes (Signed)
Blood bank informed RN that pt was only approved for 1 unit of blood. An H and H needs to be drawn 2 hours after first unit complete and then the medical director will review chart and approve for 2nd unit to be given or not.

## 2018-09-03 NOTE — Progress Notes (Signed)
Physical Therapy Treatment Patient Details Name: Emily MilchMary R Sol MRN: [id number] DOB: [birth year] Today's Date: 09/03/2018    History of Present Illness Pt is an 85 y/o female admitted 3/31 with tick bites with garbled speech, sent home, return 4/4 with LLE edema, confusion, UTI. Pt with acute aphasia and Rt Hemiplegia 4/6 with acute left cerebral infarct s/p tPA and IR.  PMHx: PVD, osteoporosis, HTN, DM II, chronic systolic heart failure, CHF, CAD, AV malformation, multiple areas of old small frontal infarctions on MRI, CABG 1990,1995,1998,2015.     PT Comments    Pt with continued aphasia but able to respond to some questions appropriately today. Progressing towards physical therapy goals with ability to ambulate this session. Ambulating x 3 feet with walker and two person moderate assistance. Displays R sided weakness, decreased coordination, increased tone, apraxia, and poor balance. Continue to recommend comprehensive inpatient rehab (CIR) for post-acute therapy needs.     Follow Up Recommendations  CIR     Equipment Recommendations  Other (comment)(TBD)    Recommendations for Other Services Rehab consult     Precautions / Restrictions Precautions Precautions: Fall Restrictions Weight Bearing Restrictions: No    Mobility  Bed Mobility Overal bed mobility: Needs Assistance Bed Mobility: Supine to Sit Rolling: Min guard   Supine to sit: Max assist     General bed mobility comments: Pt able to roll towards right with no physical assistance but required maxA for initiation/coordination of trunk and BLE movement off of edge of bed  Transfers Overall transfer level: Needs assistance Equipment used: Rolling walker (2 wheeled) Transfers: Sit to/from Stand Sit to Stand: +2 physical assistance;Mod assist         General transfer comment: Pt preferring to pull up from walker, modA + 2 to boost up and steady. Manual assistance to facilitate right knee and hip extension.  Verbal cues for feet flat as pt standing with feet plantarflexed  Ambulation/Gait Ambulation/Gait assistance: Mod assist;+2 physical assistance Gait Distance (Feet): 3 Feet Assistive device: Rolling walker (2 wheeled) Gait Pattern/deviations: Step-to pattern;Decreased stride length;Decreased weight shift to right;Decreased dorsiflexion - right;Decreased dorsiflexion - left Gait velocity: decr Gait velocity interpretation: <1.31 ft/sec, indicative of household ambulator General Gait Details: ModA + 2 for ambulation from bed to chair. Cues provided for sequencing, weight shifting, motor initation. Manual assistance for bringing RLE forward and for walker negotiation   Stairs             Wheelchair Mobility    Modified Rankin (Stroke Patients Only) Modified Rankin (Stroke Patients Only) Pre-Morbid Rankin Score: Severe disability Modified Rankin: Severe disability     Balance Overall balance assessment: Needs assistance Sitting-balance support: Feet supported;Single extremity supported Sitting balance-Leahy Scale: Fair     Standing balance support: Bilateral upper extremity supported;During functional activity Standing balance-Leahy Scale: Poor Standing balance comment: Reliant on external support to maintain standing balance. `                            Cognition Arousal/Alertness: Awake/alert Behavior During Therapy: WFL for tasks assessed/performed Overall Cognitive Status: Impaired/Different from baseline Area of Impairment: Memory;Following commands;Safety/judgement;Awareness;Problem solving                     Memory: Decreased short-term memory Following Commands: Follows one step commands consistently;Follows one step commands with increased time;Follows multi-step commands inconsistently Safety/Judgement: Decreased awareness of safety;Decreased awareness of deficits Awareness: Intellectual Problem Solving: Slow processing;Decreased  initiation;Difficulty sequencing;Requires  verbal cues;Requires tactile cues General Comments: Less repetition of phrases today      Exercises      General Comments        Pertinent Vitals/Pain Pain Assessment: Faces Faces Pain Scale: No hurt    Home Living                      Prior Function            PT Goals (current goals can now be found in the care plan section) Acute Rehab PT Goals Patient Stated Goal: "not spend money on the hospital." PT Goal Formulation: With patient Time For Goal Achievement: 09/16/18 Potential to Achieve Goals: Good Progress towards PT goals: Progressing toward goals    Frequency    Min 4X/week      PT Plan Current plan remains appropriate    Co-evaluation              AM-PAC PT "6 Clicks" Mobility   Outcome Measure  Help needed turning from your back to your side while in a flat bed without using bedrails?: A Lot Help needed moving from lying on your back to sitting on the side of a flat bed without using bedrails?: Total Help needed moving to and from a bed to a chair (including a wheelchair)?: Total Help needed standing up from a chair using your arms (e.g., wheelchair or bedside chair)?: Total Help needed to walk in hospital room?: Total Help needed climbing 3-5 steps with a railing? : Total 6 Click Score: 7    End of Session Equipment Utilized During Treatment: Gait belt Activity Tolerance: Patient tolerated treatment well Patient left: with call bell/phone within reach;in chair;with chair alarm set Nurse Communication: Mobility status PT Visit Diagnosis: Muscle weakness (generalized) (M62.81);Difficulty in walking, not elsewhere classified (R26.2)     Time: 1351-1415 PT Time Calculation (min) (ACUTE ONLY): 24 min  Charges:  $Gait Training: 8-22 mins $Therapeutic Activity: 8-22 mins                     Laurina Bustle, PT, DPT Acute Rehabilitation Services Pager [phone] Office  [phone]    [name] 09/03/2018, 3:16 PM

## 2018-09-03 NOTE — Progress Notes (Signed)
  Speech Language Pathology Treatment: Cognitive-Linquistic(Aphasia)  Patient Details Name: Emily Daniel MRN: [id number] DOB: 1930-03-20 Today's Date: 09/03/2018 Time: 3358-2518 SLP Time Calculation (min) (ACUTE ONLY): 21 min  Assessment / Plan / Recommendation Clinical Impression  Pt participated in session and was cooperative throughout it without complaint of pain. Considering pt's past resistance to participation with speech pathology, the session was performed in a less structured manner than is typical. Pt's language was less repetative/eholalic than was noted yesterday but similar instances were also observed during this session. She did produce some spontaneous phrases such as, "How much does it cost me to talk to you?",  "Let me tell you something", and "What you can do lay my bed back" but additional processing time was consistently required. She completed confrontational naming tasks with 90% accuracy increasing to 100% accuracy for retrieval of lower frequency words. She responded to yes/no questions related to conversational topics and her current activity of eating peaches with 100% accuracy. Additional processing time and question re-phrasing was necessary for her to appropriately respond to open-ended questions. A simple sequencing task was attempted but pt exhibited moderate difficulty verbalizing steps subsequent to the intial step. SLP will continue to follow pt.    HPI HPI: [name] with PMH: anxiety, CAD s/p CABG, peripheral artery disease, CHF, DM, intermittent atrial fibrillation not on anticoagulation, AVM's, hyperlipidemia and prior tobacco abuse, and multiple areas of old small frontal infarctions on MRI, who presented to ED with confusion  with MR of brain showing moderate area of acute infarction left more than right at the high cerebral convexities which could be embolic or watershed.      SLP Plan  Continue with current plan of care        Recommendations                   Follow up Recommendations: Inpatient Rehab SLP Visit Diagnosis: Aphasia (R47.01) Plan: Continue with current plan of care       Emily Daniel I. Vear Clock, MS, CCC-SLP Acute Rehabilitation Services Office number [phone] Pager [phone]                Emily Daniel 09/03/2018, 11:34 AM

## 2018-09-03 NOTE — Progress Notes (Signed)
Paged on call neuro MD regarding sys>180 despite administration of prn labetalol.  Was given prn bp med orders.  Will administer and observe.

## 2018-09-03 NOTE — TOC Initial Note (Signed)
Transition of Care [facility](TOC) - Initial/Assessment Note    Patient Details  Name: Emily Daniel MRN: [id number] Date of Birth: Mar 20, 1930  Transition of Care [facility](TOC) CM/SW Contact:    Baldemar LenisElizabeth M Axxel Gude, LCSW Phone Number: 09/03/2018, 3:34 PM  Clinical Narrative:   CSW spoke with patient's son, Molly MaduroRobert. CSW explained role and discussed beginning discharge plans. Explained recommendation for CIR and how patient will need 24/7 support at discharge. Molly MaduroRobert said that between him and his brother they would be able to provide 24/7 support for the patient at discharge. CSW discussed back-up plan for SNF if patient doesn't qualify for CIR, and Molly MaduroRobert [facility], if [facility]. Robert hopeful that patient could remain in house for rehab, and asked about if the rehab department continued the video communication so that they could still see the patient even though they can't visit. CSW to follow.                Expected Discharge Plan: Skilled Nursing Facility Barriers to Discharge: Continued Medical Work up, English as a second language teachernsurance Authorization   Patient Goals and CMS Choice Patient states their goals for this hospitalization and ongoing recovery are:: get out of the hospital CMS Medicare.gov Compare Post Acute Care list provided to:: Patient Represenative (must comment)(son) Choice offered to / list presented to : Adult Children  Expected Discharge Plan and Services Expected Discharge Plan: Skilled Nursing Facility     Post Acute Care Choice: Skilled Nursing Facility Living arrangements for the past 2 months: Single Family Home Expected Discharge Date: 08/30/18                   HH Arranged: PT [facility]  Prior Living Arrangements/Services Living arrangements for the past 2 months: Single Family Home Lives with:: Self Patient language and need for interpreter reviewed:: No Do you feel safe going back to the place where you live?: Yes      Need for  Family Participation in Patient Care: Yes (Comment) Care giver support system in place?: Yes (comment)   Criminal Activity/Legal Involvement Pertinent to Current Situation/Hospitalization: No - Comment as needed  Activities of Daily Living Home Assistive Devices/Equipment: None ADL Screening (condition at time of admission) Patient's cognitive ability adequate to safely complete daily activities?: Yes Is the patient deaf or have difficulty hearing?: No Does the patient have difficulty seeing, even when wearing glasses/contacts?: No Does the patient have difficulty concentrating, remembering, or making decisions?: No Patient able to express need for assistance with ADLs?: Yes Does the patient have difficulty dressing or bathing?: No Independently performs ADLs?: No Communication: Needs assistance Is this a change from baseline?: Pre-admission baseline Dressing (OT): Needs assistance Is this a change from baseline?: Pre-admission baseline Grooming: Needs assistance Is this a change from baseline?: Pre-admission baseline Feeding: Independent Bathing: Needs assistance Is this a change from baseline?: Pre-admission baseline Toileting: Needs assistance Is this a change from baseline?: Pre-admission baseline In/Out Bed: Needs assistance Is this a change from baseline?: Pre-admission baseline Walks in Home: Needs assistance Is this a change from baseline?: Pre-admission baseline Does the patient have difficulty walking or climbing stairs?: Yes Weakness of Legs: Both Weakness of Arms/Hands: None  Permission Sought/Granted Permission sought to share information with : Facility Medical sales representativeContact Representative, Family Supports    Share Information with NAME: Evans LanceRobert, Raymond  Permission granted to share info w AGENCY: SNF  Permission granted to share info w Relationship: Sons     Emotional Assessment  Attitude/Demeanor/Rapport: Unable to Assess Affect (typically observed): Unable to  Assess Orientation: : Oriented to Self, Oriented to Place Alcohol / Substance Use: Not Applicable Psych Involvement: No (comment)  Admission diagnosis:  Confusion [R41.0] Acute cystitis without hematuria [N30.00] Patient Active Problem List   Diagnosis Date Noted  . Acute ischemic left MCA stroke (HCC) 08/31/2018  . Cellulitis of left leg without foot 08/29/2018  . Acute lower UTI 08/29/2018  . Acute metabolic encephalopathy 08/29/2018  . TIA (transient ischemic attack) 08/25/2018  . Trochanteric bursitis, right hip 07/01/2016  . Chronic right-sided low back pain without sciatica 07/01/2016  . PAD (peripheral artery disease) (HCC) 08/09/2014  . Leg pain 05/23/2014  . Atrial fibrillation (HCC) 11/22/2013  . Mitral valve disorders(424.0) 11/22/2013  . S/P mitral valve repair 11/19/2013  . S/P CABG x 3 11/11/2013  . Chest pain 11/08/2013  . NSTEMI (non-ST elevated myocardial infarction) (HCC) 11/08/2013  . Iron deficiency anemia 11/08/2013  . Acute heart failure (HCC) 11/08/2013  . Cerumen impaction 09/30/2013  . B12 DEFICIENCY 06/21/2010  . Osteoporosis 03/16/2010  . BACK PAIN 01/02/2010  . PVD 04/14/2009  . ARTERIOVENOUS MALFORMATION, COLON 04/14/2009  . CHANGE IN BOWELS 04/14/2009  . DM (diabetes mellitus) type II uncontrolled, periph vascular disorder (HCC) 03/13/2009  . Anxiety state 03/13/2009  . DYSURIA 04/01/2008  . Hyperlipidemia 05/18/2007  . Essential hypertension 02/02/2007  . ARTIFICIAL MENOPAUSE 02/02/2007   PCP:  Donato Schultz, DO Pharmacy:   [facility] [address] [place] - [address] [place] [address] Phone: [phone] Fax: [phone]     Social Determinants of Health (SDOH) Interventions    Readmission Risk Interventions No flowsheet data found.

## 2018-09-03 NOTE — Consult Note (Signed)
Physical Medicine and Rehabilitation Consult Reason for Consult: Decreased functional mobility Referring Physician: Dr.Xu   HPI: Emily Daniel is a 85 y.o.right handed female with history of CAD status post CABG, diastolic congestive heart failure, diabetes mellitus, intermittent atrial fibrillation not on anticoagulation due to AVMs, hold small frontal infarctions per MRI. Per chart review patient lives alone. One level home..Presented 08/29/2018 with altered mental status/aphasia. Suspect UTI placed on antibiotic therapy as well as noted cellulitis lower extremities. Urine study returned enterococcus maintain on amoxicillin as well as doxycycline.Cranial CT scan negative. CT angiogram head and neck with no large vessel occlusion. Severe preocclusive atheromatosis stenosis at the left ICA origin. Also noted acute subsegmental pulmonary emboli in the right upper lobe. Placed on intravenous heparin.MRI the brain showed patchy acute infarcts along the left more than right high cerebral convexities which could be embolic versus watershed. Echocardiogram with ejection fraction of 65% normal systolic function. EEG was negative for seizure. Patient developed severe anemia requiring transfusion heparin discontinued. Lower extremity Dopplers negative for DVT.neurology follow-up awaiting possible need to consider left CEA. Radiology follow-up for planned cerebral arteriogram once hemoglobin stable. Therapy evaluation completed with recommendations of physical medicine rehabilitation consult.   Review of Systems  Unable to perform ROS: Language   Past Medical History:  Diagnosis Date   Anxiety    Atrial fibrillation (HCC)    post op >>> req'd Amio and Coumadin   AVM (arteriovenous malformation)    CAD (coronary artery disease)    a. NSTEMI 10/2013 >>> LHC (10/2013):  Distal left main 90%, proximal LAD 30%, mid LAD 20%, ostial circumflex 99%, then 80%, mid RCA 99%, EF 30-35%, 3-4+ MR; proximal  right iliac occluded, right renal 50%, ostial left iliac 70% >>> CABG/MV repair   Cataract    CHF (congestive heart failure) (HCC)    Dr. Dorris Fetch following   Chronic systolic heart failure (HCC)    Diabetes mellitus, type 2 (HCC)    Hemorrhoids    Hx of echocardiogram    Echo (9/15):  Mild LVH, EF 40-45%, inf AK, Gr 1 DD, trivial AI, MV repair ok (mild MS, mild MR), mild LAE, PASP 32 mmHg.   Hyperlipidemia    Hypertension    Ischemic cardiomyopathy    a. Echo (11/08/13):  EF 30-35%, diffuse hypokinesis, severe MR, mild LAE, PASP 48 mm Hg   Mitral regurgitation    a. s/p MV repair at time of CABG   Osteoporosis    PVD (peripheral vascular disease) (HCC)    Vitamin B12 deficiency    Past Surgical History:  Procedure Laterality Date   ABDOMINAL HYSTERECTOMY     1969   APPENDECTOMY     1945   CORONARY ARTERY BYPASS GRAFT  1990, 1995, 1998   HEMORRHOID SURGERY     1959   IR PATIENT EVAL TECH 0-60 MINS  08/31/2018   LEFT AND RIGHT HEART CATHETERIZATION WITH CORONARY ANGIOGRAM N/A 11/10/2013   Procedure: LEFT AND RIGHT HEART CATHETERIZATION WITH CORONARY ANGIOGRAM;  Surgeon: Lennette Bihari, MD;  Location: [facility] CATH LAB;  Service: Cardiovascular;  Laterality: N/A;   MITRAL VALVE REPLACEMENT (MVR)/CORONARY ARTERY BYPASS GRAFTING (CABG) N/A 11/11/2013   Procedure: MITRAL VALVE Ring repair (MVR)/CORONARY ARTERY BYPASS GRAFTING (CABG) times three using left internal mammary and left sphenous vein.;  Surgeon: Loreli Slot, MD;  Location: [facility] OR;  Service: Open Heart Surgery;  Laterality: N/A;  Coronary artery bypass graft times three using left internal mammary artery  and left saphenous leg vein using endoscope.  Exploration of right leg.    Family History  Problem Relation Age of Onset   Cervical cancer Mother    Cancer Brother        renal ...x3??   Hypertension Other    Stroke Other    Hypertension Sister    Heart attack Neg Hx    Social History:   reports that she quit smoking about 34 years ago. Her smoking use included cigarettes. She has a 36.00 pack-year smoking history. She has never used smokeless tobacco. She reports that she does not drink alcohol or use drugs. Allergies:  Allergies  Allergen Reactions   Meperidine Hcl Nausea And Vomiting   Sulfonamide Derivatives Nausea And Vomiting   Demerol [Meperidine]     Unknown per pt    Medications Prior to Admission  Medication Sig Dispense Refill   Calcium Carbonate-Vitamin D (CALCIUM-VITAMIN D) 600-200 MG-UNIT CAPS Take 1 capsule by mouth 2 (two) times daily.      Casanthranol-Docusate Sodium 30-100 MG CAPS Take 1 capsule by mouth daily as needed (for constipation).      ferrous sulfate 325 (65 FE) MG tablet Take 325 mg by mouth 2 (two) times daily with a meal.      lisinopril (PRINIVIL,ZESTRIL) 40 MG tablet TAKE 1 TABLET BY MOUTH ONCE DAILY (Patient taking differently: Take 40 mg by mouth daily. ) 90 tablet 3   Multiple Vitamin (MULTIVITAMIN) tablet Take 1 tablet by mouth daily.       pravastatin (PRAVACHOL) 40 MG tablet Take 1 tablet (40 mg total) by mouth daily. 90 tablet 3   sitaGLIPtin (JANUVIA) 100 MG tablet Take 1 tablet (100 mg total) by mouth daily. 90 tablet 1   Tetrahydrozoline HCl (VISINE OP) Apply 1 drop to eye daily as needed (dry eyes).     vitamin B-12 (CYANOCOBALAMIN) 1000 MCG tablet Take 1,000 mcg by mouth daily.       [DISCONTINUED] Aspirin (ECOTRIN LOW STRENGTH PO) Take 81 mg by mouth at bedtime.      [DISCONTINUED] doxycycline (VIBRA-TABS) 100 MG tablet Take 1 tablet (100 mg total) by mouth 2 (two) times daily. 20 tablet 0   [DISCONTINUED] metoprolol tartrate (LOPRESSOR) 50 MG tablet TAKE 1 TABLET BY MOUTH TWICE DAILY (Patient taking differently: Take 50 mg by mouth 2 (two) times daily. ) 180 tablet 3    Home: Home Living Family/patient expects to be discharged to:: Private residence Living Arrangements: Alone Available Help at Discharge:  Family, Available 24 hours/day Type of Home: House Home Access: Stairs to enter [employer] of Steps: 1 Home Layout: One level Bathroom Shower/Tub: Engineer, manufacturing systems: Standard Home Equipment: Cane - single point, Environmental consultant - 2 wheels Additional Comments: Home set up obtained from previous admission  Functional History: Prior Function Level of Independence: Needs assistance Gait / Transfers Assistance Needed: unclear ADL's / Homemaking Assistance Needed: has been sponge bathing Functional Status:  Mobility: Bed Mobility Overal bed mobility: Needs Assistance Bed Mobility: Supine to Sit, Sit to Supine Rolling: Min assist Sidelying to sit: Mod assist Supine to sit: Max assist Sit to supine: Max assist Sit to sidelying: Mod assist General bed mobility comments: MaxA provided for all bed mobility. Needs physical assistance for initiation Transfers Overall transfer level: Needs assistance Equipment used: 2 person hand held assist Transfers: Sit to/from Stand, Stand Pivot Transfers Sit to Stand: Max assist Stand pivot transfers: Max assist, +2 physical assistance General transfer comment: MaxA + 2 for stand  pivot towards both right and left. Right knee blocked for stability. Tactile/verbal cues for trunk and hip extension Ambulation/Gait General Gait Details: Unable to ambulate at this time.     ADL: ADL Overall ADL's : Needs assistance/impaired Eating/Feeding: NPO Grooming: Maximal assistance, Sitting Upper Body Bathing: Maximal assistance, Sitting Lower Body Bathing: Maximal assistance, Sit to/from stand Upper Body Dressing : Maximal assistance, Sitting Lower Body Dressing: Maximal assistance, Sit to/from stand Lower Body Dressing Details (indicate cue type and reason): Pt unable to don socks due to decreased sitting balance and following of commands Toilet Transfer: Maximal assistance, +2 for physical assistance, +2 for safety/equipment,  Stand-pivot(simulated to recliner) Functional mobility during ADLs: Maximal assistance, +2 for physical assistance, +2 for safety/equipment General ADL Comments: Pt requiring Max A for ADLs and demosntrates decreased cognition, balance, strength, and activity tolerance  Cognition: Cognition Overall Cognitive Status: Impaired/Different from baseline Orientation Level: Oriented to person, Oriented to place, Disoriented to time, Disoriented to situation, Oriented to time Attention: Selective Sustained Attention: Appears intact Selective Attention: Impaired Selective Attention Impairment: Verbal complex Awareness: Impaired Awareness Impairment: Intellectual impairment Behaviors: Perseveration Safety/Judgment: Impaired Comments: Patient did not exhibit consistent awareness to deficits Cognition Arousal/Alertness: Awake/alert Behavior During Therapy: WFL for tasks assessed/performed Overall Cognitive Status: Impaired/Different from baseline Area of Impairment: Memory, Following commands, Safety/judgement, Awareness, Problem solving Memory: Decreased short-term memory Following Commands: Follows one step commands consistently, Follows one step commands with increased time, Follows multi-step commands inconsistently Safety/Judgement: Decreased awareness of safety, Decreased awareness of deficits Awareness: Intellectual Problem Solving: Slow processing, Decreased initiation, Difficulty sequencing, Requires verbal cues, Requires tactile cues General Comments: Presenting with confusion. Answering questions ~50% of time. Demonstrating echolalia and repeated quetsions and phrases.   Blood pressure (!) 133/51, pulse 72, temperature 98.7 F (37.1 C), temperature source Axillary, resp. rate (!) 30, height 5' (1.524 m), weight 56.8 kg, SpO2 100 %. Physical Exam  Constitutional: She appears well-developed and well-nourished. No distress.  HENT:  Head: Normocephalic.  Eyes: Pupils are equal, round,  and reactive to light. Conjunctivae are normal.  Neck: Normal range of motion. Neck supple. No thyromegaly present.  Cardiovascular: Normal rate, regular rhythm and normal heart sounds. Exam reveals no friction rub.  No murmur heard. Cardiac rate controlled  Respiratory: Effort normal and breath sounds normal. No respiratory distress.  GI: Soft. Bowel sounds are normal. She exhibits no distension. There is no abdominal tenderness.  Musculoskeletal:     Comments: No pain with upper limb or lower limb range of motion with exception of right lower extremity does have some pain with knee range of motion  Neurological: She is alert.  Alert/NAD...aphasic     Very inconsistent to follow commands      Motor strength is 3- in the right deltoid bicep tricep grip to minus in the right hip flexor knee extensor ankle dorsiflexor 4/5 in the left deltoid bicep tricep grip 4/5 in the left hip flexor knee extensor ankle dorsiflexor Sensation difficult to assess she does withdraw to pinch in the right upper and lower limb Visual fields cannot be assessed secondary to communication she does track well  Skin: Skin is warm and dry. She is not diaphoretic.  Left upper extremity IV sites clean dry and intact  Psychiatric:  Aphasic No evidence of lability or agitation    Results for orders placed or performed during the hospital encounter of 08/29/18 (from the past 24 hour(s))  Glucose, capillary     Status: Abnormal   Collection Time: 09/02/18  5:35 PM  Result Value Ref Range   Glucose-Capillary 117 (H) 70 - 99 mg/dL   Comment 1 Notify RN    Comment 2 Document in Chart   Glucose, capillary     Status: Abnormal   Collection Time: 09/02/18 11:22 PM  Result Value Ref Range   Glucose-Capillary 141 (H) 70 - 99 mg/dL  Basic metabolic panel     Status: Abnormal   Collection Time: 09/03/18  4:53 AM  Result Value Ref Range   Sodium 140 135 - 145 mmol/L   Potassium 3.3 (L) 3.5 - 5.1 mmol/L   Chloride 111 98 -  111 mmol/L   CO2 20 (L) 22 - 32 mmol/L   Glucose, Bld 129 (H) 70 - 99 mg/dL   BUN 32 (H) 8 - 23 mg/dL   Creatinine, Ser 1.61 0.44 - 1.00 mg/dL   Calcium 8.7 (L) 8.9 - 10.3 mg/dL   GFR calc non Af Amer 56 (L) >60 mL/min   GFR calc Af Amer >60 >60 mL/min   Anion gap 9 5 - 15  Heparin level (unfractionated)     Status: None   Collection Time: 09/03/18  4:53 AM  Result Value Ref Range   Heparin Unfractionated 0.48 0.30 - 0.70 IU/mL  CBC with Differential/Platelet     Status: Abnormal   Collection Time: 09/03/18  4:53 AM  Result Value Ref Range   WBC 10.6 (H) 4.0 - 10.5 K/uL   RBC 2.27 (L) 3.87 - 5.11 MIL/uL   Hemoglobin 7.5 (L) 12.0 - 15.0 g/dL   HCT 09.6 (L) 04.5 - 40.9 %   MCV 102.2 (H) 80.0 - 100.0 fL   MCH 33.0 26.0 - 34.0 pg   MCHC 32.3 30.0 - 36.0 g/dL   RDW 81.1 91.4 - 78.2 %   Platelets 244 150 - 400 K/uL   nRBC 0.0 0.0 - 0.2 %   Neutrophils Relative % 67 %   Neutro Abs 7.0 1.7 - 7.7 K/uL   Lymphocytes Relative 21 %   Lymphs Abs 2.2 0.7 - 4.0 K/uL   Monocytes Relative 10 %   Monocytes Absolute 1.1 (H) 0.1 - 1.0 K/uL   Eosinophils Relative 1 %   Eosinophils Absolute 0.2 0.0 - 0.5 K/uL   Basophils Relative 0 %   Basophils Absolute 0.0 0.0 - 0.1 K/uL   Immature Granulocytes 1 %   Abs Immature Granulocytes 0.10 (H) 0.00 - 0.07 K/uL  CBC     Status: Abnormal   Collection Time: 09/03/18  7:39 AM  Result Value Ref Range   WBC 10.0 4.0 - 10.5 K/uL   RBC 2.17 (L) 3.87 - 5.11 MIL/uL   Hemoglobin 7.1 (L) 12.0 - 15.0 g/dL   HCT 95.6 (L) 21.3 - 08.6 %   MCV 102.3 (H) 80.0 - 100.0 fL   MCH 32.7 26.0 - 34.0 pg   MCHC 32.0 30.0 - 36.0 g/dL   RDW 57.8 46.9 - 62.9 %   Platelets 230 150 - 400 K/uL   nRBC 0.0 0.0 - 0.2 %  Type and screen [facility]     Status: None (Preliminary result)   Collection Time: 09/03/18 10:08 AM  Result Value Ref Range   ABO/RH(D) O POS    Antibody Screen NEG    Sample Expiration 09/06/2018    Unit Number [id number]    Blood  Component Type RED CELLS,LR    Unit division 00    Status of Unit ISSUED    Transfusion  Status OK TO TRANSFUSE    Crossmatch Result      Compatible Performed at [facility], [address] [place], [place] [address]   Prepare RBC     Status: None   Collection Time: 09/03/18 10:08 AM  Result Value Ref Range   Order Confirmation      ORDER PROCESSED BY BLOOD BANK Performed at [facility], [address], [place], [place] [address]   Glucose, capillary     Status: Abnormal   Collection Time: 09/03/18 11:29 AM  Result Value Ref Range   Glucose-Capillary 127 (H) 70 - 99 mg/dL   Dg Chest Port 1 View  Result Date: 09/02/2018 CLINICAL DATA:  Leukocytosis EXAM: PORTABLE CHEST 1 VIEW COMPARISON:  09/01/2018 FINDINGS: Cardiac shadow remains enlarged. Postsurgical changes are again. Aortic calcifications are again seen. The lungs are well aerated bilaterally. No focal infiltrate or sizable effusion is seen. No acute bony abnormality is noted. IMPRESSION: No active disease. Electronically Signed   By: Alcide Clever M.D.   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 Does the need for close, 24 hr/day medical supervision in concert with the patient's rehab needs make it unreasonable for this patient to be served in a less intensive setting? Yes 2. Co-Morbidities requiring supervision/potential complications: Recent GI bleed, left ICA stenosis, severe acute blood loss anemia requiring transfusion 3. Due to bladder management, bowel management, safety, skin/wound care, disease management, medication administration, pain management and patient education, does the patient require 24 hr/day rehab nursing? Potentially 4. Does the patient require coordinated care of a physician, rehab nurse, PT (1-2 hrs/day, 5 days/week), OT (1-2 hrs/day, 5 days/week) and SLP (.5-1 hrs/day, 5  days/week) to address physical and functional deficits in the context of the above medical diagnosis(es)? Potentially Addressing deficits in the following areas: balance, endurance, locomotion, strength, transferring, bowel/bladder control, bathing, dressing, feeding, grooming, toileting, cognition, speech, language, swallowing and psychosocial support 5. Can the patient actively participate in an intensive therapy program of at least 3 hrs of therapy per day at least 5 days per week? Potentially 6. The potential for patient to make measurable gains while on inpatient rehab is Good once medically stable 7. Anticipated functional outcomes upon discharge from inpatient rehab are min assist  with PT, min assist with OT, min assist with SLP. 8. Estimated rehab length of stay to reach the above functional goals is: 20 to 23 days 9. Anticipated D/C setting: Home 10. Anticipated post D/C treatments: HH therapy 11. Overall Rehab/Functional Prognosis: good  RECOMMENDATIONS: This patient's condition is appropriate for continued rehabilitative care in the following setting: CIR once medically stable, see below Patient has agreed to participate in recommended program. N/A Note that insurance prior authorization may be required for reimbursement for recommended care.  Comment: Patient with recent GI bleed on heparin, now off dual antiplatelet agents and consideration is for left carotid endarterectomy.  Decision on the timing of this procedure needs to be made prior to CIR.   "I have personally performed a face to face diagnostic evaluation of this patient.  Additionally, I have reviewed and concur with the physician assistant's documentation above." Erick Colace M.D. [facility] FAAPM&R (Sports Med, Neuromuscular Med) Diplomate Am Board of Electrodiagnostic Med  Charlton Amor, PA-C 09/03/2018

## 2018-09-04 ENCOUNTER — Encounter (HOSPITAL_COMMUNITY): Payer: Self-pay | Admitting: Interventional Radiology

## 2018-09-04 LAB — OCCULT BLOOD X 1 CARD TO LAB, STOOL: Fecal Occult Bld: NEGATIVE

## 2018-09-04 LAB — CBC
HCT: 24.2 % — ABNORMAL LOW (ref 36.0–46.0)
HCT: 26.8 % — ABNORMAL LOW (ref 36.0–46.0)
Hemoglobin: 7.8 g/dL — ABNORMAL LOW (ref 12.0–15.0)
Hemoglobin: 8.7 g/dL — ABNORMAL LOW (ref 12.0–15.0)
MCH: 31.8 pg (ref 26.0–34.0)
MCH: 31.6 pg (ref 26.0–34.0)
MCHC: 32.2 g/dL (ref 30.0–36.0)
MCHC: 32.5 g/dL (ref 30.0–36.0)
MCV: 98.8 fL (ref 80.0–100.0)
MCV: 97.5 fL (ref 80.0–100.0)
Platelets: 203 10*3/uL (ref 150–400)
Platelets: 237 10*3/uL (ref 150–400)
RBC: 2.45 MIL/uL — ABNORMAL LOW (ref 3.87–5.11)
RBC: 2.75 MIL/uL — ABNORMAL LOW (ref 3.87–5.11)
RDW: 16.9 % — ABNORMAL HIGH (ref 11.5–15.5)
RDW: 16.6 % — ABNORMAL HIGH (ref 11.5–15.5)
WBC: 9.7 10*3/uL (ref 4.0–10.5)
WBC: 12 10*3/uL — ABNORMAL HIGH (ref 4.0–10.5)
nRBC: 0 % (ref 0.0–0.2)
nRBC: 0 % (ref 0.0–0.2)

## 2018-09-04 LAB — BASIC METABOLIC PANEL
Anion gap: 8 (ref 5–15)
BUN: 29 mg/dL — ABNORMAL HIGH (ref 8–23)
CO2: 18 mmol/L — ABNORMAL LOW (ref 22–32)
Calcium: 8.3 mg/dL — ABNORMAL LOW (ref 8.9–10.3)
Chloride: 115 mmol/L — ABNORMAL HIGH (ref 98–111)
Creatinine, Ser: 0.99 mg/dL (ref 0.44–1.00)
GFR calc Af Amer: 60 mL/min — ABNORMAL LOW (ref >60)
GFR calc non Af Amer: 52 mL/min — ABNORMAL LOW (ref >60)
Glucose, Bld: 130 mg/dL — ABNORMAL HIGH (ref 70–99)
Potassium: 3.4 mmol/L — ABNORMAL LOW (ref 3.5–5.1)
Sodium: 141 mmol/L (ref 135–145)

## 2018-09-04 LAB — IRON AND TIBC
Iron: 79 ug/dL (ref 28–170)
Saturation Ratios: 39 % — ABNORMAL HIGH (ref 10.4–31.8)
TIBC: 204 ug/dL — ABNORMAL LOW (ref 250–450)
UIBC: 125 ug/dL

## 2018-09-04 LAB — FERRITIN: Ferritin: 82 ng/mL (ref 11–307)

## 2018-09-04 LAB — GLUCOSE, CAPILLARY
Glucose-Capillary: 128 mg/dL — ABNORMAL HIGH (ref 70–99)
Glucose-Capillary: 167 mg/dL — ABNORMAL HIGH (ref 70–99)
Glucose-Capillary: 150 mg/dL — ABNORMAL HIGH (ref 70–99)
Glucose-Capillary: 161 mg/dL — ABNORMAL HIGH (ref 70–99)

## 2018-09-04 LAB — HEPARIN LEVEL (UNFRACTIONATED): Heparin Unfractionated: 0.34 IU/mL (ref 0.30–0.70)

## 2018-09-04 LAB — FOLATE: Folate: 33.5 ng/mL (ref >5.9)

## 2018-09-04 LAB — VITAMIN B12: Vitamin B-12: 2821 pg/mL — ABNORMAL HIGH (ref 180–914)

## 2018-09-04 MED ORDER — HEPARIN (PORCINE) 25000 UT/250ML-% IV SOLN
700.0000 [IU]/h | INTRAVENOUS | Status: DC
  Filled 2018-09-04: qty 250

## 2018-09-04 MED ORDER — POTASSIUM CHLORIDE CRYS ER 10 MEQ PO TBCR
EXTENDED_RELEASE_TABLET | ORAL | Status: AC
  Filled 2018-09-04: qty 4

## 2018-09-04 MED ORDER — POTASSIUM CHLORIDE CRYS ER 20 MEQ PO TBCR
40.0000 meq | EXTENDED_RELEASE_TABLET | ORAL | Status: AC
  Administered 2018-09-04 (×2): 40 meq via ORAL
  Filled 2018-09-04: qty 2

## 2018-09-04 MED ORDER — BISACODYL 10 MG RE SUPP
10.0000 mg | Freq: Once | RECTAL | Status: AC
  Administered 2018-09-04: 10 mg via RECTAL
  Filled 2018-09-04: qty 1

## 2018-09-04 NOTE — Progress Notes (Signed)
STROKE TEAM PROGRESS NOTE   INTERVAL HISTORY Pt lying in bed, no distress. Awake alert and aphasia improved some, able to name 3/4 now and able to answer questions with 3 words answers. Hb 7.8, will restart heparin IV but hold off antiplatelets. Will consider CEA instead of CAS once stable.    Vitals:   09/04/18 0500 09/04/18 0800 09/04/18 0900 09/04/18 1100  BP: (!) 133/53 (!) 144/52 (!) 138/54   Pulse: 83 79 92   Resp: 20 (!) 27 (!) 22   Temp:   98.3 F (36.8 C) 98.6 F (37 C)  TempSrc:   Oral Oral  SpO2: 96% 94% 98%   Weight:      Height:        CBC:  Recent Labs  Lab 08/29/18 1411  09/03/18 0453 09/03/18 0739 09/03/18 1615 09/04/18 0333  WBC 7.8   < > 10.6* 10.0  --  9.7  NEUTROABS 4.9  --  7.0  --   --   --   HGB 9.9*   < > 7.5* 7.1* 9.2* 7.8*  HCT 29.2*   < > 23.2* 22.2* 27.7* 24.2*  MCV 102.1*   < > 102.2* 102.3*  --  98.8  PLT 239   < > 244 230  --  203   < > = values in this interval not displayed.    Basic Metabolic Panel:  Recent Labs  Lab 09/03/18 0453 09/04/18 0333  NA 140 141  K 3.3* 3.4*  CL 111 115*  CO2 20* 18*  GLUCOSE 129* 130*  BUN 32* 29*  CREATININE 0.93 0.99  CALCIUM 8.7* 8.3*   Lipid Panel:     Component Value Date/Time   CHOL 134 09/01/2018 0308   TRIG 85 09/01/2018 0308   HDL 38 (L) 09/01/2018 0308   CHOLHDL 3.5 09/01/2018 0308   VLDL 17 09/01/2018 0308   LDLCALC 79 09/01/2018 0308   HgbA1c:  Lab Results  Component Value Date   HGBA1C 5.9 (H) 09/01/2018   Urine Drug Screen:     Component Value Date/Time   LABOPIA NONE DETECTED 08/25/2018 1945   COCAINSCRNUR NONE DETECTED 08/25/2018 1945   LABBENZ NONE DETECTED 08/25/2018 1945   AMPHETMU NONE DETECTED 08/25/2018 1945   THCU NONE DETECTED 08/25/2018 1945   LABBARB NONE DETECTED 08/25/2018 1945    Alcohol Level     Component Value Date/Time   ETH <10 08/25/2018 1736    IMAGING Mr Brain Wo Contrast  Result Date: 09/01/2018 CLINICAL DATA:  Focal neuro deficit.  EXAM: MRI HEAD WITHOUT CONTRAST TECHNIQUE: Multiplanar, multiecho pulse sequences of the brain and surrounding structures were obtained without intravenous contrast. COMPARISON:  Head CT and CTA from yesterday FINDINGS: Brain: Overall moderate area of patchy acute infarction along the left more than right high cerebral convexities which could be embolic or watershed. There is a pre-existing smaller area of infarct at the high left frontal parietal convexity. There is petechial hemorrhage associated with a left-sided infarction. Patchy mainly peripheral remote microhemorrhages, suspicious for amyloid angiopathy. No acute hematoma, hydrocephalus, or masslike finding. Confluent chronic small vessel ischemic gliosis in the periventricular white matter. Small remote bilateral cerebellar infarcts. Vascular: Major flow voids are preserved.  There was CTA yesterday Skull and upper cervical spine: Negative for marrow lesion Sinuses/Orbits: Bilateral cataract resection IMPRESSION: 1. Moderate area of acute infarction along the left more than right high cerebral convexity, likely watershed. Mild petechial hemorrhage on the left 2. Background of extensive chronic small vessel ischemia  in the cerebral white matter. 3. Scattered remote micro hemorrhages suspicious for amyloid angiopathy. Electronically Signed   By: Marnee Spring M.D.   On: 09/01/2018 05:56   Dg Chest Port 1 View  Result Date: 09/02/2018 CLINICAL DATA:  Leukocytosis EXAM: PORTABLE CHEST 1 VIEW COMPARISON:  09/01/2018 FINDINGS: Cardiac shadow remains enlarged. Postsurgical changes are again. Aortic calcifications are again seen. The lungs are well aerated bilaterally. No focal infiltrate or sizable effusion is seen. No acute bony abnormality is noted. IMPRESSION: No active disease. Electronically Signed   By: Alcide Clever M.D.   On: 09/02/2018 09:12   EEG This is an abnormal EEG secondary to general background slowing.  This finding may be seen with a  diffuse disturbance that is etiologically nonspecific, but may include a metabolic encephalopathy.  Can not rule out normal drowse as well.  No epileptiform activity was noted.    2D Echocardiogram bubble  1. The left ventricle has normal systolic function, with an ejection fraction of 60-65%. The cavity size was normal. Left ventricular diastolic function could not be evaluated due to nondiagnostic images.  2. The right ventricle has normal systolc function. The cavity was normal. There is no increase in right ventricular wall thickness.  3. RVSP moderate-severely elevated, with range of 43 mmHg-55 mmHg if RA pressures is estimated bewteen 3-15 mmHg. IVC not assessed on today's exam.  4. A 30 mm Sorin memo 3D annuloplasty ring is present in the mitral position Dorris Fetch 11/11/2013). No dehiscence.  5. Mitral diastolic prosthetic gradient severely elevated, 10 mmHg (HR 83 bpm). Trivial mitral valve regurgitation. No pannus or thrombus noted on ring.  6. Tricuspid valve regurgitation is mild-moderate.  7. The aortic valve is abnormal Moderate calcification of the aortic valve. Aortic valve regurgitation is trivial by color flow Doppler.  8. Severly calcified aorta sinotubular junction, prominent on posterior portion of ST junction.  9. No intracardiac thrombi or masses were visualized. Saline contrast bubble study was negative, with no evidence of any interatrial shunt.   PHYSICAL EXAM  Temp:  [98.2 F (36.8 C)-99.1 F (37.3 C)] 98.6 F (37 C) (04/10 1100) Pulse Rate:  [69-92] 92 (04/10 0900) Resp:  [14-30] 22 (04/10 0900) BP: (107-172)/(45-136) 138/54 (04/10 0900) SpO2:  [79 %-100 %] 98 % (04/10 0900) Arterial Line BP: (66-180)/(38-125) 100/79 (04/10 0900)  General - Well nourished, well developed, in no apparent distress.  Ophthalmologic - fundi not visualized due to noncooperation.  Cardiovascular - Regular rate and rhythm, not in afib.  Neuro - awake alert, eyes open, she still  repeat sentences but able to name 3/4 and answer some questions with short answers. Orientated to self, age and time as well as place. Able to name 2/4. Blinking to visual threat to left not to right, but no gaze deviation, able to attend to both sides, able to track bilaterally. PERRL, EOMI. Right nasolabial fold mild flattening. LUE 4/5 and LLE 3/5, RUE 0/5 with increased muscle tone. RLE 3-/5. DTR 1+ and right babinski. Sensation, coordination not cooperative and gait not tested.   ASSESSMENT/PLAN Emily Daniel is a 85 y.o. female with history of CAD S/P CABG, PAD, HFrEF, paroxysmal A. Fib. Not on anticoagulation, AVMs, and multiple areas of small frontal infarcts on MRI was admitted on 08/29/18 due to confusion and worsening mental status. Treated for UTI and cellulitis. In hospital, she developed CP w/o ST changes and treated w/ morphine. 1 hr later emesis. Then noted global aphasia, R facial and R  hemiparesis.  Received IV tPA 08/31/2018 at 0756.  Stroke: Left MCA and bilateral ACA scattered infarcts, consistent with left ICA proximal high-grade stenosis (azygous ACAs) and AF not on AC  Code Stroke CT head acute on chronic high L infarcts. ASPECTS 8    CTA head & neck no ELVO. Severe, pre occlusive stenosis L ICA origin. RUL PE.  Azygous ACAs coming out from left ICA terminal  CXR CM and vascular congestion  MRI  Moderate L>R high cerebral convexity watershed infarcts. Mild PH on the L. Small vessel disease. Micro hmgs suspicious for CAA.  2D Echo EF 60-65%. No source of embolus. No clot/mass. Bubble study neg.   Cerebral angio left ICA proximal 90%+ stenosis  EEG generalized background slowing. No sz.  LDL 79  HgbA1c 5.9  P2Y12 409   SCDs for VTE prophylaxis  aspirin 81 mg daily prior to admission, resume heparin IV but hold off DAPT due to severe anemia needing blood transfusion.   Therapy recommendations:  CIR  Disposition:  pending  Chest Pain/ RUL PE  LE dopplers  neg DVT  Received IV tPA 11/30/2018 for stroke  2D Echo EF 60-65%. No source of embolus. No clot/mass. Bubble study neg. Mult cardiac abnormalities.  Heparin IV resumed today  Atrial Fibrillation  Home anticoagulation:  none   family not eager to start [facility] at time of admission . Currently in NSR . On metoprolol  . Heparin IV resumed today   Carotid Stenosis  CTA L ICA 90% stenosis  Confirmed on cerebral angio  Was on ASA and brilinta, but developed severe anemia, DAPT was discontinued  Recommend left CEA instead of CAS given severe anemia with AC and DAPT.   VVS consult recommended once Hb stabilized   Leukocytosis, improving  UTI with enterococcus faecalis  LLE cellulitis   WBC 7.5->12.3->14.2->10.6->9.7  Treated with full course amoxicillin 500 q 8h & doxycycline 100 bid po  UA 08/29/18 neg  UA 09/02/18 neg  UCx 20K enterococcus faecalis  CXR 09/02/18 on active disease  Severe anemia  Hgb 11.9->10.3->9.9->7.5->7.1->7.8  PRBC with 2 units  No obvious source at this time  iron panel - TIBC 204, Sat 39  B12 - 2821  folate and ferritin - ok   Continue iron po  Close monitoring H&H  Stool occult blood pending  Known ascending one single colon AVM and internal hemorrhoids  Recommend CEA in the future instead of CAS  Resumed IV heparin and monitor HGB reponse  Hypertension  BP stable  Home meds:  Lisinopril 40, metoprolol 50 bid Treated w/ labetolol and cleviprex to keep in goal off cleviprex now  . Resumed po meds  . Long-term BP goal normotensive  Hyperlipidemia  Home meds:  pravachol 40, resumed in hospital  LDL 79, goal < 70  Continue pravastatin 40  Continue statin at discharge  Diabetes type II Controlled w/ PVD  Home meds:  januvia 100  HgbA1c 5.9, goal < 7.0  CBGs  SSI  Close PCP follow up  Other Stroke Risk Factors  Advanced age  Former Cigarette smoker, quit 17 y ago  Family hx stroke (other)  Coronary artery  disease s/p NSTEMI, CABG  Chronic systolic Congestive heart failure  Ischemic cardiomyopathy   Mitral regurg s/p repair at time of CABG  PVD  Other Active Problems  LLE cellulitis. LE dopplers neg for DVT. Treated with course doxycycline 100 bid po  Renal insufficiency, resolved Cr 1.07->1.13->0.98->0.93  Hospital day # 4  This patient  is critically ill due to stroke status post TPA, severe anemia needing PRBC, left carotid high-grade stenosis, A. fib not on AC, PE and at significant risk of neurological worsening, death form recurrent stroke, hypertensive emergency, hemorrhagic conversion, heart failure, massive PE. This patient's care requires constant monitoring of vital signs, hemodynamics, respiratory and cardiac monitoring, review of multiple databases, neurological assessment, discussion with family, other specialists and medical decision making of high complexity. I had long discussion with son Molly Maduro over the phone, updated pt current condition, treatment plan and potential prognosis. He expressed understanding and appreciation. I spent 35 minutes of neurocritical care time in the care of this patient.   Marvel Plan, MD PhD Stroke Neurology 09/04/2018 12:11 PM   To contact Stroke Continuity provider, please refer to [email or web address]. After hours, contact General Neurology

## 2018-09-04 NOTE — Progress Notes (Signed)
ANTICOAGULATION CONSULT NOTE - Follow Up Consult  Pharmacy Consult for Heparin Indication: pulmonary embolus  Allergies  Allergen Reactions  . Meperidine Hcl Nausea And Vomiting  . Sulfonamide Derivatives Nausea And Vomiting  . Demerol [Meperidine]     Unknown per pt     Patient Measurements: Height: 5' (152.4 cm) Weight: 125 lb 3.5 oz (56.8 kg) IBW/kg (Calculated) : 45.5 Heparin Dosing Weight: 56.8 kg  Vital Signs: Temp: 98 F (36.7 C) (04/10 1641) Temp Source: Oral (04/10 1641) BP: 144/61 (04/10 1641) Pulse Rate: 86 (04/10 1641)  Labs: Recent Labs    09/02/18 0240 09/02/18 1314 09/03/18 0453 09/03/18 0739 09/03/18 1615 09/04/18 0333 09/04/18 1842  HGB 9.9*  --  7.5* 7.1* 9.2* 7.8* 8.7*  HCT 29.8*  --  23.2* 22.2* 27.7* 24.2* 26.8*  PLT 298  --  244 230  --  203 237  HEPARINUNFRC 0.40 0.40 0.48  --   --   --  0.34  CREATININE 0.98  --  0.93  --   --  0.99  --     Estimated Creatinine Clearance: 32.2 mL/min (by C-G formula based on SCr of 0.99 mg/dL).  Assessment:  85 year old female with a history of Afib not on anti-coagulation prior to admission admitted with new CVA.  Also, found to have a pulmonary embolism.  She received TPA 08/31/18 at 8 am.  Pharmacy consulted to begin heparin 24 hours post TPA administration with low heparin goal and no bolus.  Was also on dual antiplatelet therapy with aspirin 81 mg and ticagrelor 90 mg BID.  IR procedure had been planned for 4/8 but postponed x 2 days to date.  Heparin level therapeutic this PM HgB = 8.7  Goal of Therapy:  Heparin level 0.3-0.5 units/ml Monitor platelets by anticoagulation protocol: Yes   Plan:  Continue heparin at 700 units / hr Low therapeutic heparin level goal, no boluses planned. Heparin level and CBC at least daily while on heparin. Monitor for any bleeding. Heparin to be held and MD to be notified if Hgb <7.  Thank you Okey Regal, PharmD [phone] 09/04/2018,7:04 PM

## 2018-09-04 NOTE — Progress Notes (Signed)
Inpatient Rehab Admissions:  Inpatient Rehab Consult received.  I met with patient at the bedside for rehabilitation assessment and to discuss goals and expectations of an inpatient rehab admission.  Pt with aphasia and unable to participate in assessment actively.  She did nod her head in consent for me to contact her son, Francee Piccolo.  I will follow up with him this afternoon.   Signed: Shann Medal, PT, DPT Admissions Coordinator [phone] 09/04/18  12:19 PM

## 2018-09-04 NOTE — Progress Notes (Signed)
ANTICOAGULATION CONSULT NOTE - Follow Up Consult  Pharmacy Consult for Heparin Indication: pulmonary embolus  Allergies  Allergen Reactions  . Meperidine Hcl Nausea And Vomiting  . Sulfonamide Derivatives Nausea And Vomiting  . Demerol [Meperidine]     Unknown per pt     Patient Measurements: Height: 5' (152.4 cm) Weight: 125 lb 3.5 oz (56.8 kg) IBW/kg (Calculated) : 45.5 Heparin Dosing Weight: 56.8 kg  Vital Signs: Temp: 98.3 F (36.8 C) (04/10 0900) Temp Source: Oral (04/10 0900) BP: 138/54 (04/10 0900) Pulse Rate: 92 (04/10 0900)  Labs: Recent Labs    09/02/18 0240 09/02/18 1314 09/03/18 0453 09/03/18 0739 09/03/18 1615 09/04/18 0333  HGB 9.9*  --  7.5* 7.1* 9.2* 7.8*  HCT 29.8*  --  23.2* 22.2* 27.7* 24.2*  PLT 298  --  244 230  --  203  HEPARINUNFRC 0.40 0.40 0.48  --   --   --   CREATININE 0.98  --  0.93  --   --  0.99    Estimated Creatinine Clearance: 32.2 mL/min (by C-G formula based on SCr of 0.99 mg/dL).  Assessment:  85 year old female with a history of Afib not on anti-coagulation prior to admission admitted with new CVA.  Also, found to have a pulmonary embolism.  She received TPA 08/31/18 at 8 am.  Pharmacy consulted to begin heparin 24 hours post TPA administration with low heparin goal and no bolus.  Was also on dual antiplatelet therapy with aspirin 81 mg and ticagrelor 90 mg BID.  IR procedure had been planned for 4/8 but postponed x 2 days to date.     Heparin drip, ASA and ticagrelor stopped 4/9 due to drop in hemoglobin. No obvious bleeding source.  Transfused with 1 unit on 4/9, Hgb 7.8 today and IV heparin to resume with close monitoring of CBC.  Discussed with Dr. Roda Shutters: to hold IV heparin and notify MD if Hgb <7.     Last heparin level was therapeutic (0.48) on 750 units/hr on 4/9.  Upper end of low therapeutic target range.  Goal of Therapy:  Heparin level 0.3-0.5 units/ml Monitor platelets by anticoagulation protocol: Yes   Plan:   Resume heparin drip at 700 units/hr  Heparin level and CBC ~8 hrs after drip resumes.  Low therapeutic heparin level goal, no boluses planned.  Heparin level and CBC at least daily while on heparin.  Monitor for any bleeding.  Heparin to be held and MD to be notified if Hgb <7.  Dennie Fetters, RPh Pager: [phone] or phone: [phone] 09/04/2018,10:06 AM

## 2018-09-04 NOTE — Progress Notes (Signed)
Physical Therapy Treatment Patient Details Name: Emily Daniel MRN: [id number] DOB: September 05, 1929 Today's Date: 09/04/2018    History of Present Illness Pt is an 85 y/o female admitted 3/31 with tick bites with garbled speech, sent home, return 4/4 with LLE edema, confusion, UTI. Pt with acute aphasia and Rt Hemiplegia 4/6 with acute left cerebral infarct s/p tPA and IR.  PMHx: PVD, osteoporosis, HTN, DM II, chronic systolic heart failure, CHF, CAD, AV malformation, multiple areas of old small frontal infarctions on MRI, CABG 1990,1995,1998,2015.     PT Comments    Pt requiring less assist overall for bed mobility this session with increased time provided and multimodal cues for sequencing. However, she did require increased assist for sitting balance (due to heavy right lateral lean) and transfers secondary to fatigue. Displays poor static/dynamic balance, RUE weakness with increased tone, decreased motor coordination, communication impairments, and decreased cognition. Continue to recommend comprehensive inpatient rehab (CIR) for post-acute therapy needs.    Follow Up Recommendations  CIR     Equipment Recommendations  Other (comment)(TBD)    Recommendations for Other Services Rehab consult     Precautions / Restrictions Precautions Precautions: Fall Restrictions Weight Bearing Restrictions: No    Mobility  Bed Mobility Overal bed mobility: Needs Assistance Bed Mobility: Supine to Sit Rolling: Min guard   Supine to sit: Mod assist     General bed mobility comments: Rolling towards right without physical assistance and then able to progress to edge of bed with mod assist for trunk elevation and max, multimodal cues for sequencing BLE's off edge of bed   Transfers Overall transfer level: Needs assistance Equipment used: Rolling walker (2 wheeled) Transfers: Sit to/from [employer] Sit to Stand: +2 physical assistance;Max assist         General transfer  comment: Pt requiring maxA + 2 to stand from edge of bed with increased trunk/hip/knee flexion and right foot plantarflexed. Multimodal cues for upright positioning and to incorporate upward gaze. Stand pivot transfer from bed to chair towards right  Ambulation/Gait                 Stairs             Wheelchair Mobility    Modified Rankin (Stroke Patients Only) Modified Rankin (Stroke Patients Only) Pre-Morbid Rankin Score: Severe disability Modified Rankin: Severe disability     Balance Overall balance assessment: Needs assistance Sitting-balance support: Feet supported;Single extremity supported Sitting balance-Leahy Scale: Poor Sitting balance - Comments: Heavy right lateral lean today, responding fairly well with visual/tactile cues to achieve midline but unable to maintain > 5 seconds   Standing balance support: Bilateral upper extremity supported;During functional activity Standing balance-Leahy Scale: Zero                              Cognition Arousal/Alertness: Awake/alert Behavior During Therapy: WFL for tasks assessed/performed Overall Cognitive Status: Impaired/Different from baseline Area of Impairment: Memory;Following commands;Safety/judgement;Awareness;Problem solving                     Memory: Decreased short-term memory Following Commands: Follows one step commands consistently;Follows one step commands with increased time;Follows multi-step commands inconsistently Safety/Judgement: Decreased awareness of safety;Decreased awareness of deficits Awareness: Intellectual Problem Solving: Slow processing;Decreased initiation;Difficulty sequencing;Requires verbal cues;Requires tactile cues General Comments: Pt able to answer some questions appropriately but continues with repetition of phrases      Exercises General Exercises -  Lower Extremity Long Arc Quad: Both;5 reps;Seated    General Comments        Pertinent  Vitals/Pain Pain Assessment: Faces Faces Pain Scale: No hurt    Home Living                      Prior Function            PT Goals (current goals can now be found in the care plan section) Acute Rehab PT Goals Patient Stated Goal: "not spend money on the hospital." PT Goal Formulation: With patient Time For Goal Achievement: 09/16/18 Potential to Achieve Goals: Good Progress towards PT goals: Progressing toward goals    Frequency    Min 4X/week      PT Plan Current plan remains appropriate    Co-evaluation PT/OT/SLP Co-Evaluation/Treatment: Yes Reason for Co-Treatment: Complexity of the patient's impairments (multi-system involvement);For patient/therapist safety;To address functional/ADL transfers PT goals addressed during session: Mobility/safety with mobility;Balance        AM-PAC PT "6 Clicks" Mobility   Outcome Measure  Help needed turning from your back to your side while in a flat bed without using bedrails?: A Lot Help needed moving from lying on your back to sitting on the side of a flat bed without using bedrails?: Total Help needed moving to and from a bed to a chair (including a wheelchair)?: Total Help needed standing up from a chair using your arms (e.g., wheelchair or bedside chair)?: Total Help needed to walk in hospital room?: Total Help needed climbing 3-5 steps with a railing? : Total 6 Click Score: 7    End of Session Equipment Utilized During Treatment: Gait belt Activity Tolerance: Patient limited by fatigue Patient left: with call bell/phone within reach;in chair;with chair alarm set Nurse Communication: Mobility status PT Visit Diagnosis: Muscle weakness (generalized) (M62.81);Difficulty in walking, not elsewhere classified (R26.2)     Time: [id number] PT Time Calculation (min) (ACUTE ONLY): 32 min  Charges:  $Neuromuscular Re-education: 8-22 mins                    Laurina Bustlearoline Aditya Nastasi, PT, DPT Acute Rehabilitation  Services Pager [phone] Office [phone]    Vanetta MuldersCarloine H Tahir Blank 09/04/2018, 1:50 PM

## 2018-09-04 NOTE — Progress Notes (Signed)
Occupational Therapy Treatment Patient Details Name: Emily Daniel MRN: [id number] DOB: [birth year] Today's Date: 09/04/2018    History of present illness Pt is an 85 y/o female admitted 3/31 with tick bites with garbled speech, sent home, return 4/4 with LLE edema, confusion, UTI. Pt with acute aphasia and Rt Hemiplegia 4/6 with acute left cerebral infarct s/p tPA and IR.  PMHx: PVD, osteoporosis, HTN, DM II, chronic systolic heart failure, CHF, CAD, AV malformation, multiple areas of old small frontal infarctions on MRI, CABG 1990,1995,1998,2015.    OT comments  Pt presents supine in bed pleasant and willing to participate in therapy session. Pt continues to demonstrate R lateral lean sitting EOB, is able to self correct given multimodal cues to do so but is only able to maintain midline posture for brief periods of time due to fatigue, requiring min-maxA to correct pending level of fatigue. Pt completed simple grooming ADL seated EOB with minguard assist for ADL task and increased assist for sitting balance without LUE suport. Pt continues to require two person assist for safe completion of functional transfers. Feel she remains an excellent candidate for CIR level services at time of discharge. Will continue to follow acutely.    Follow Up Recommendations  CIR;Supervision/Assistance - 24 hour    Equipment Recommendations  Other (comment)(TBA)          Precautions / Restrictions Precautions Precautions: Fall Restrictions Weight Bearing Restrictions: No       Mobility Bed Mobility Overal bed mobility: Needs Assistance Bed Mobility: Supine to Sit Rolling: Min guard   Supine to sit: Mod assist     General bed mobility comments: Rolling towards right without physical assistance and then able to progress to edge of bed with mod assist for trunk elevation and max, multimodal cues for sequencing BLE's off edge of bed   Transfers Overall transfer level: Needs assistance Equipment  used: Rolling walker (2 wheeled) Transfers: Sit to/from [employer] Sit to Stand: +2 physical assistance;Max assist         General transfer comment: Pt requiring maxA + 2 to stand from edge of bed with increased trunk/hip/knee flexion and right foot plantarflexed. Multimodal cues for upright positioning and to incorporate upward gaze. Stand pivot transfer from bed to chair towards right    Balance Overall balance assessment: Needs assistance Sitting-balance support: Feet supported;Single extremity supported Sitting balance-Leahy Scale: Poor Sitting balance - Comments: Heavy right lateral lean today, responding fairly well with visual/tactile cues to achieve midline but unable to maintain > 5 seconds   Standing balance support: Bilateral upper extremity supported;During functional activity Standing balance-Leahy Scale: Zero                             ADL either performed or assessed with clinical judgement   ADL Overall ADL's : Needs assistance/impaired Eating/Feeding: Set up;Sitting Eating/Feeding Details (indicate cue type and reason): to open containers, setup for drink end of session Grooming: Wash/dry face;Min guard;Sitting Grooming Details (indicate cue type and reason): minguard for grooming task; up to modA for sitting balance                             Functional mobility during ADLs: Maximal assistance;+2 for physical assistance;+2 for safety/equipment       Vision       Perception     Praxis      Cognition Arousal/Alertness: Awake/alert  Behavior During Therapy: WFL for tasks assessed/performed Overall Cognitive Status: Impaired/Different from baseline Area of Impairment: Memory;Following commands;Safety/judgement;Awareness;Problem solving                     Memory: Decreased short-term memory Following Commands: Follows one step commands consistently;Follows one step commands with increased time;Follows  multi-step commands inconsistently Safety/Judgement: Decreased awareness of safety;Decreased awareness of deficits Awareness: Intellectual Problem Solving: Slow processing;Decreased initiation;Difficulty sequencing;Requires verbal cues;Requires tactile cues General Comments: Pt able to answer some questions appropriately but continues with repetition of phrases        Exercises Exercises: General Lower Extremity General Exercises - Lower Extremity Long Arc Quad: Both;5 reps;Seated   Shoulder Instructions       General Comments VSS    Pertinent Vitals/ Pain       Pain Assessment: Faces Faces Pain Scale: No hurt  Home Living                                          Prior Functioning/Environment              Frequency  Min 3X/week        Progress Toward Goals  OT Goals(current goals can now be found in the care plan section)  Progress towards OT goals: Progressing toward goals  Acute Rehab OT Goals Patient Stated Goal: "not spend money on the hospital." OT Goal Formulation: With patient Time For Goal Achievement: 09/16/18 Potential to Achieve Goals: Good  Plan Discharge plan remains appropriate    Co-evaluation    PT/OT/SLP Co-Evaluation/Treatment: Yes Reason for Co-Treatment: Complexity of the patient's impairments (multi-system involvement);For patient/therapist safety;To address functional/ADL transfers PT goals addressed during session: Mobility/safety with mobility;Balance        AM-PAC OT "6 Clicks" Daily Activity     Outcome Measure   Help from another person eating meals?: A Little Help from another person taking care of personal grooming?: A Lot Help from another person toileting, which includes using toliet, bedpan, or urinal?: A Lot Help from another person bathing (including washing, rinsing, drying)?: A Lot Help from another person to put on and taking off regular upper body clothing?: A Lot Help from another person to  put on and taking off regular lower body clothing?: A Lot 6 Click Score: 13    End of Session Equipment Utilized During Treatment: Gait belt;Rolling walker  OT Visit Diagnosis: Unsteadiness on feet (R26.81);Other abnormalities of gait and mobility (R26.89);Muscle weakness (generalized) (M62.81);Other symptoms and signs involving cognitive function;Hemiplegia and hemiparesis Hemiplegia - Right/Left: Right Hemiplegia - caused by: Cerebral infarction   Activity Tolerance Patient tolerated treatment well;Patient limited by fatigue   Patient Left in chair;with call bell/phone within reach;with chair alarm set   Nurse Communication Mobility status        Time: [id number] OT Time Calculation (min): 32 min  Charges: OT General Charges $OT Visit: 1 Visit OT Treatments $Self Care/Home Management : 8-22 mins  Emily Daniel, OT Supplemental Rehabilitation Services Pager [phone] Office [phone]    Emily Daniel 09/04/2018, 2:09 PM

## 2018-09-04 NOTE — Progress Notes (Signed)
  Speech Language Pathology Treatment: Cognitive-Linquistic(Aphasia)  Patient Details Name: Emily Daniel MRN: [id number] DOB: [birth year] Today's Date: 09/04/2018 Time: 4462-8638 SLP Time Calculation (min) (ACUTE ONLY): 21 min  Assessment / Plan / Recommendation Clinical Impression  Pt was seen for aphasia treatment. She was alert and cooperative throughout the session but increased echolalia was noted compared to yesterday. She was able to provide information regarding what she had for breakfast but required moderate cues to retrieve "eggs". She demonstrated 40% accuracy with responsive naming tasks increasing to 100% accuracy with orthographic cues. She formulated sentences related to single-action photos with 70% accuracy increasing to 100% accuracy with minimal cues to produce complete sentences. SLP will continue to follow pt.    HPI HPI: [name] with PMH: anxiety, CAD s/p CABG, peripheral artery disease, CHF, DM, intermittent atrial fibrillation not on anticoagulation, AVM's, hyperlipidemia and prior tobacco abuse, and multiple areas of old small frontal infarctions on MRI, who presented to ED with confusion  with MR of brain showing moderate area of acute infarction left more than right at the high cerebral convexities which could be embolic or watershed.      SLP Plan  Continue with current plan of care       Recommendations                   Follow up Recommendations: Inpatient Rehab SLP Visit Diagnosis: Aphasia (R47.01) Plan: Continue with current plan of care       Bunyan Brier I. Vear Clock, MS, CCC-SLP Acute Rehabilitation Services Office number [phone] Pager [phone]                 Scheryl Marten 09/04/2018, 11:04 AM

## 2018-09-05 DIAGNOSIS — E1151 Type 2 diabetes mellitus with diabetic peripheral angiopathy without gangrene: Principal | ICD-10-CM

## 2018-09-05 DIAGNOSIS — E1165 Type 2 diabetes mellitus with hyperglycemia: Secondary | ICD-10-CM

## 2018-09-05 DIAGNOSIS — I63412 Cerebral infarction due to embolism of left middle cerebral artery: Secondary | ICD-10-CM

## 2018-09-05 DIAGNOSIS — N390 Urinary tract infection, site not specified: Secondary | ICD-10-CM

## 2018-09-05 DIAGNOSIS — I10 Essential (primary) hypertension: Secondary | ICD-10-CM

## 2018-09-05 DIAGNOSIS — I480 Paroxysmal atrial fibrillation: Secondary | ICD-10-CM

## 2018-09-05 DIAGNOSIS — D649 Anemia, unspecified: Secondary | ICD-10-CM

## 2018-09-05 LAB — GLUCOSE, CAPILLARY
Glucose-Capillary: 132 mg/dL — ABNORMAL HIGH (ref 70–99)
Glucose-Capillary: 181 mg/dL — ABNORMAL HIGH (ref 70–99)
Glucose-Capillary: 180 mg/dL — ABNORMAL HIGH (ref 70–99)
Glucose-Capillary: 216 mg/dL — ABNORMAL HIGH (ref 70–99)

## 2018-09-05 LAB — BASIC METABOLIC PANEL
Anion gap: 10 (ref 5–15)
BUN: 22 mg/dL (ref 8–23)
CO2: 20 mmol/L — ABNORMAL LOW (ref 22–32)
Calcium: 8.9 mg/dL (ref 8.9–10.3)
Chloride: 112 mmol/L — ABNORMAL HIGH (ref 98–111)
Creatinine, Ser: 0.97 mg/dL (ref 0.44–1.00)
GFR calc Af Amer: >60 mL/min (ref >60)
GFR calc non Af Amer: 53 mL/min — ABNORMAL LOW (ref >60)
Glucose, Bld: 128 mg/dL — ABNORMAL HIGH (ref 70–99)
Potassium: 4.1 mmol/L (ref 3.5–5.1)
Sodium: 142 mmol/L (ref 135–145)

## 2018-09-05 LAB — CBC
HCT: 27 % — ABNORMAL LOW (ref 36.0–46.0)
Hemoglobin: 8.8 g/dL — ABNORMAL LOW (ref 12.0–15.0)
MCH: 31.8 pg (ref 26.0–34.0)
MCHC: 32.6 g/dL (ref 30.0–36.0)
MCV: 97.5 fL (ref 80.0–100.0)
Platelets: 227 10*3/uL (ref 150–400)
RBC: 2.77 MIL/uL — ABNORMAL LOW (ref 3.87–5.11)
RDW: 16 % — ABNORMAL HIGH (ref 11.5–15.5)
WBC: 11.2 10*3/uL — ABNORMAL HIGH (ref 4.0–10.5)
nRBC: 0 % (ref 0.0–0.2)

## 2018-09-05 LAB — TRANSFERRIN: Transferrin: 174 mg/dL — ABNORMAL LOW (ref 192–382)

## 2018-09-05 LAB — HEPARIN LEVEL (UNFRACTIONATED): Heparin Unfractionated: 0.4 IU/mL (ref 0.30–0.70)

## 2018-09-05 LAB — LACTATE DEHYDROGENASE: LDH: 236 U/L — ABNORMAL HIGH (ref 98–192)

## 2018-09-05 NOTE — Progress Notes (Signed)
Bladder scanned pt with >961ml of retained urine. In and out cathed pt with volume of . Bladder scanned pt with residual of >69ml.

## 2018-09-05 NOTE — Progress Notes (Signed)
STROKE TEAM PROGRESS NOTE   INTERVAL HISTORY Pt lying in bed, sleepy but arousable, still has aphasia, able to repeat. Hb improved overnight this am 8.8, on heparin IV since yesterday. Will recommend [facility]VS consult to consider CEA when appropriate.    Vitals:   09/05/18 0000 09/05/18 0311 09/05/18 0802 09/05/18 1229  BP: (!) 153/55 (!) 167/66 (!) 183/75 (!) 190/59  Pulse: 84 75 78 67  Resp: Temp: 99.2 F (37.3 C) 98.8 F (37.1 C) 99.1 F (37.3 C) 99.2 F (37.3 C)  TempSrc: Oral Oral Oral Oral  SpO2: 99% 97% 97% 97%  Weight:      Height:        CBC:  Recent Labs  Lab 09/03/18 0453  09/04/18 1842 09/05/18 0452  WBC 10.6*   < > 12.0* 11.2*  NEUTROABS 7.0  --   --   --   HGB 7.5*   < > 8.7* 8.8*  HCT 23.2*   < > 26.8* 27.0*  MCV 102.2*   < > 97.5 97.5  PLT 244   < > 237 227   < > = values in this interval not displayed.    Basic Metabolic Panel:  Recent Labs  Lab 09/04/18 0333 09/05/18 0452  NA 141 142  K 3.4* 4.1  CL 115* 112*  CO2 18* 20*  GLUCOSE 130* 128*  BUN 29* 22  CREATININE 0.99 0.97  CALCIUM 8.3* 8.9   Lipid Panel:     Component Value Date/Time   CHOL 134 09/01/2018 0308   TRIG 85 09/01/2018 0308   HDL 38 (L) 09/01/2018 0308   CHOLHDL 3.5 09/01/2018 0308   VLDL 17 09/01/2018 0308   LDLCALC 79 09/01/2018 0308   HgbA1c:  Lab Results  Component Value Date   HGBA1C 5.9 (H) 09/01/2018   Urine Drug Screen:     Component Value Date/Time   LABOPIA NONE DETECTED 08/25/2018 1945   COCAINSCRNUR NONE DETECTED 08/25/2018 1945   LABBENZ NONE DETECTED 08/25/2018 1945   AMPHETMU NONE DETECTED 08/25/2018 1945   THCU NONE DETECTED 08/25/2018 1945   LABBARB NONE DETECTED 08/25/2018 1945    Alcohol Level     Component Value Date/Time   ETH <10 08/25/2018 1736    IMAGING  Mr Brain Wo Contrast Result Date: 09/01/2018 CLINICAL DATA:  Focal neuro deficit. EXAM: MRI HEAD WITHOUT CONTRAST TECHNIQUE: Multiplanar, multiecho pulse sequences of  the brain and surrounding structures were obtained without intravenous contrast. COMPARISON:  Head CT and CTA from yesterday FINDINGS: Brain: Overall moderate area of patchy acute infarction along the left more than right high cerebral convexities which could be embolic or watershed. There is a pre-existing smaller area of infarct at the high left frontal parietal convexity. There is petechial hemorrhage associated with a left-sided infarction. Patchy mainly peripheral remote microhemorrhages, suspicious for amyloid angiopathy. No acute hematoma, hydrocephalus, or masslike finding. Confluent chronic small vessel ischemic gliosis in the periventricular white matter. Small remote bilateral cerebellar infarcts. Vascular: Major flow voids are preserved.  There was CTA yesterday Skull and upper cervical spine: Negative for marrow lesion Sinuses/Orbits: Bilateral cataract resection  IMPRESSION:  1. Moderate area of acute infarction along the left more than right high cerebral convexity, likely watershed. Mild petechial hemorrhage on the left  2. Background of extensive chronic small vessel ischemia in the cerebral white matter.  3. Scattered remote micro hemorrhages suspicious for amyloid angiopathy.    Dg Chest Port 1 View Result Date: 09/02/2018 CLINICAL  DATA:  Leukocytosis EXAM: PORTABLE CHEST 1 VIEW COMPARISON:  09/01/2018 FINDINGS: Cardiac shadow remains enlarged. Postsurgical changes are again. Aortic calcifications are again seen. The lungs are well aerated bilaterally. No focal infiltrate or sizable effusion is seen. No acute bony abnormality is noted.  IMPRESSION:  No active disease.    EEG This is an abnormal EEG secondary to general background slowing.  This finding may be seen with a diffuse disturbance that is etiologically nonspecific, but may include a metabolic encephalopathy.  Can not rule out normal drowse as well.  No epileptiform activity was noted.    2D Echocardiogram bubble  1. The  left ventricle has normal systolic function, with an ejection fraction of 60-65%. The cavity size was normal. Left ventricular diastolic function could not be evaluated due to nondiagnostic images.  2. The right ventricle has normal systolc function. The cavity was normal. There is no increase in right ventricular wall thickness.  3. RVSP moderate-severely elevated, with range of 43 mmHg-55 mmHg if RA pressures is estimated bewteen 3-15 mmHg. IVC not assessed on today's exam.  4. A 30 mm Sorin memo 3D annuloplasty ring is present in the mitral position Dorris Fetch 11/11/2013). No dehiscence.  5. Mitral diastolic prosthetic gradient severely elevated, 10 mmHg (HR 83 bpm). Trivial mitral valve regurgitation. No pannus or thrombus noted on ring.  6. Tricuspid valve regurgitation is mild-moderate.  7. The aortic valve is abnormal Moderate calcification of the aortic valve. Aortic valve regurgitation is trivial by color flow Doppler.  8. Severly calcified aorta sinotubular junction, prominent on posterior portion of ST junction.  9. No intracardiac thrombi or masses were visualized. Saline contrast bubble study was negative, with no evidence of any interatrial shunt.   PHYSICAL EXAM  Temp:  [98 F (36.7 C)-99.2 F (37.3 C)] 99.2 F (37.3 C) (04/11 1229) Pulse Rate:  [67-89] 67 (04/11 1229) Resp:  [16-22] 17 (04/11 1229) BP: (144-190)/(55-85) 190/59 (04/11 1229) SpO2:  [96 %-99 %] 97 % (04/11 1229)  General - Well nourished, well developed, sleepy.  Ophthalmologic - fundi not visualized due to noncooperation.  Cardiovascular - Regular rate and rhythm, not in afib.  Neuro - sleepy but arousable, still repeat sentences, did not answer orientation questions. Blinking to visual threat to left not to right, but no gaze deviation, able to attend to both sides, able to track bilaterally. PERRL, EOMI. Right nasolabial fold mild flattening. LUE 4/5 and LLE 3/5, RUE 0/5 with increased muscle tone. RLE  3-/5. DTR 1+ and right babinski. Sensation, coordination not cooperative and gait not tested.   ASSESSMENT/PLAN Emily Daniel is a 85 y.o. female with history of CAD S/P CABG, PAD, HFrEF, paroxysmal A. Fib. Not on anticoagulation, AVMs, and multiple areas of small frontal infarcts on MRI was admitted on 08/29/18 due to confusion and worsening mental status. Treated for UTI and cellulitis. In hospital, she developed CP w/o ST changes and treated w/ morphine. 1 hr later emesis. Then noted global aphasia, R facial and R hemiparesis.  Received IV tPA 08/31/2018 at 0756.  Stroke: Left MCA and bilateral ACA scattered infarcts, consistent with left ICA proximal high-grade stenosis (azygous ACAs) and AF not on AC  Code Stroke CT head acute on chronic high L infarcts. ASPECTS 8    CTA head & neck no ELVO. Severe, pre occlusive stenosis L ICA origin. RUL PE.  Azygous ACAs coming out from left ICA terminal  CXR CM and vascular congestion  MRI  Moderate L>R high cerebral  convexity watershed infarcts. Mild PH on the L. Small vessel disease. Micro hmgs suspicious for CAA.  2D Echo EF 60-65%. No source of embolus. No clot/mass. Bubble study neg.   Cerebral angio left ICA proximal 90%+ stenosis  EEG generalized background slowing. No sz.  LDL 79  HgbA1c 5.9  P2Y12 [id number]   SCDs for VTE prophylaxis  aspirin 81 mg daily prior to admission, on heparin IV but hold off DAPT due to severe anemia needing blood transfusion.   Therapy recommendations:  CIR  Disposition:  pending  Chest Pain/ RUL PE  LE dopplers neg DVT  Received IV tPA 11/30/2018 for stroke  2D Echo EF 60-65%. No source of embolus. No clot/mass. Bubble study neg. Mult cardiac abnormalities.  On heparin IV  Atrial Fibrillation  Home anticoagulation:  none   [facility] . Currently in NSR . On metoprolol  . Heparin IV resumed   Carotid Stenosis  CTA L ICA 90% stenosis  Confirmed on  cerebral angio  Was on ASA and brilinta, but developed severe anemia, DAPT was discontinued  Recommend left CEA instead of CAS given severe anemia with AC and DAPT.   Will consult [facility]VS to consider left CEA when appropriate   Leukocytosis, improving  UTI with enterococcus faecalis  LLE cellulitis   WBC 7.5->12.3->14.2->10.6->9.7->11.2  Treated with full course amoxicillin 500 q 8h & doxycycline 100 bid po  UA 08/29/18 neg  UA 09/02/18 neg  UCx 20K enterococcus faecalis  CXR 09/02/18 on active disease  Severe anemia  Hgb 11.9->10.3->9.9->7.5->7.1->7.8->8.8  S/p PRBC 1 unit  No obvious source at this time  iron panel - TIBC 204, Sat 39  B12 - 2821  folate and ferritin - ok   Continue iron po  Stool occult blood negative on 09/04/2018  Known ascending one single colon AVM and internal hemorrhoids  Recommend CEA in the future instead of CAS  Continue IV heparin  CBC monitoring  Hypertension  BP stable, occasionally high 190/59 today  Home meds:  Lisinopril 40, metoprolol 50 bid Treated w/ labetolol and cleviprex to keep in goal Off cleviprex now  . Resumed po meds  . Long-term BP goal normotensive  Hyperlipidemia  Home meds:  pravachol 40, resumed in hospital  LDL 79, goal < 70  Continue pravastatin 40  Continue statin at discharge  Diabetes type II Controlled w/ PVD  Home meds:  januvia 100  HgbA1c 5.9, goal < 7.0  CBGs  SSI  Close PCP follow up  Other Stroke Risk Factors  Advanced age  Former Cigarette smoker, quit 3034 y ago  Family hx stroke (other)  Coronary artery disease s/p NSTEMI, CABG  Chronic systolic Congestive heart failure  Ischemic cardiomyopathy   Mitral regurg s/p repair at time of CABG  PVD  Other Active Problems  LLE cellulitis. LE dopplers neg for DVT. Treated with course doxycycline 100 bid po  Renal insufficiency, resolved Cr 1.07->1.13->0.98->0.93->0.97  Hospital day # 5  Emily PlanJindong Chellie Vanlue, MD PhD Stroke  Neurology 09/05/2018 4:47 PM      To contact Stroke Continuity provider, please refer to [email or web address]. After hours, contact General Neurology

## 2018-09-05 NOTE — Progress Notes (Signed)
PROGRESS NOTE    Emily Daniel  UYQ:[id number]  DOB: 1929/11/11  DOA: 08/29/2018 PCP: Donato Schultz, DO  Brief Narrative: 85 year old female with history of hypertension, hyperlipidemia, diabetes, CAD status post CABG and mitral valve repair for MR, chronic systolic CHF, peripheral vascular disease,Atrial fibrillation not on anticoagulation at baseline due to GI bleed/AVMs, old frontal infarctions on MRI presented to ED on April 4 with confusion, initially attributed to UTI/left lower extremity cellulitis.  On April 6 patient had chest pain with EKG showing ST changes followed by emesis/global aphasia/right facial and right-sided hemiparesis.  CT head showed acute on chronic frontal infarcts, MRI of the head showed moderate left greater than right cerebral convexity watershed infarcts with petechial hemorrhages on the left.  She was seen by neurology and received TPA on April 6 at 7:56 AM.  CTA of the head and neck showed preocclusive stenosis of left internal carotid artery as well as right upper lobe PE.  Cerebral angiogram showed left ICA proximal 90% stenosis.  EEG showed generalized background slowing.  Patient now on IV heparin for PE/ CVA with high-grade left carotid stenosis.  Per neurology, patient did not tolerate aspirin/Brilinta in addition to heparin with drop in hemoglobin.  Hence, recommend vascular surgery evaluation for CEA rather than stenting.  Patient finished antibiotic course with amoxicillin/doxycycline for enterococcal UTI/cellulitis.  She has been seen by speech therapy and currently on regular diet.  She is being evaluated by PT/acute rehab  Subjective: Patient awake and alert.  Oriented x2.  She ate breakfast well but did not eat any lunch.  She states she is not hungry.  Objective: Vitals:   09/05/18 0311 09/05/18 0802 09/05/18 1229 09/05/18 1638  BP: (!) 167/66 (!) 183/75 (!) 190/59 (!) 199/75  Pulse: 75 78 67 74  Resp: Temp: 98.8 F (37.1 C)  99.1 F (37.3 C) 99.2 F (37.3 C) 99.3 F (37.4 C)  TempSrc: Oral Oral Oral Oral  SpO2: 97% 97% 97% 95%  Weight:      Height:        Intake/Output Summary (Last 24 hours) at 09/05/2018 1709 Last data filed at 09/05/2018 1500 Gross per 24 hour  Intake 772.25 ml  Output 1039 ml  Net -266.75 ml   Filed Weights   08/29/18 1338 08/29/18 1856  Weight: 57.6 kg 56.8 kg    Physical Examination:  General exam: Appears calm and comfortable  Respiratory system: Clear to auscultation. Respiratory effort normal. Cardiovascular system: S1 & S2 heard, RRR. No JVD, murmurs, rubs, gallops or clicks. No pedal edema. Gastrointestinal system: Abdomen is nondistended, soft and nontender. No organomegaly or masses felt. Normal bowel sounds heard. Central nervous system: Alert and oriented x2. ?  Mild right facial droop.  Right hemiparesis (right lower extremity 3/5 in right upper extremity flaccid) strength 4/5 on the left side.  Extremities: No edema Skin: No rashes, lesions or ulcers Psychiatry: Judgement and insight appear normal. Mood & affect appropriate.     Data Reviewed: I have personally reviewed following labs and imaging studies  CBC: Recent Labs  Lab 09/03/18 0453 09/03/18 0739 09/03/18 1615 09/04/18 0333 09/04/18 1842 09/05/18 0452  WBC 10.6* 10.0  --  9.7 12.0* 11.2*  NEUTROABS 7.0  --   --   --   --   --   HGB 7.5* 7.1* 9.2* 7.8* 8.7* 8.8*  HCT 23.2* 22.2* 27.7* 24.2* 26.8* 27.0*  MCV 102.2* 102.3*  --  98.8 97.5 97.5  PLT 244 230  --  203 237 227   Basic Metabolic Panel: Recent Labs  Lab 08/31/18 1125 09/02/18 0240 09/03/18 0453 09/04/18 0333 09/05/18 0452  NA 141 138 140 141 142  K 3.9 3.9 3.3* 3.4* 4.1  CL 110 109 111 115* 112*  CO2 21* 20* 20* 18* 20*  GLUCOSE 161* 150* 129* 130* 128*  BUN 16 16 32* 29* 22  CREATININE 1.13* 0.98 0.93 0.99 0.97  CALCIUM 9.0 9.0 8.7* 8.3* 8.9   GFR: Estimated Creatinine Clearance: 32.9 mL/min (by C-G formula based on SCr  of 0.97 mg/dL). Liver Function Tests: Recent Labs  Lab 08/31/18 1125  AST 27  ALT 26  ALKPHOS 43  BILITOT 0.7  PROT 6.0*  ALBUMIN 3.1*   No results for input(s): LIPASE, AMYLASE in the last 168 hours. No results for input(s): AMMONIA in the last 168 hours. Coagulation Profile: Recent Labs  Lab 08/31/18 1125  INR 1.3*   Cardiac Enzymes: Recent Labs  Lab 08/31/18 0639 08/31/18 1125 08/31/18 1804  TROPONINI <0.03 <0.03 <0.03   BNP (last 3 results) No results for input(s): PROBNP in the last 8760 hours. HbA1C: No results for input(s): HGBA1C in the last 72 hours. CBG: Recent Labs  Lab 09/04/18 1829 09/04/18 2131 09/05/18 0635 09/05/18 1140 09/05/18 1637  GLUCAP 150* 161* 132* 181* 180*   Lipid Profile: No results for input(s): CHOL, HDL, LDLCALC, TRIG, CHOLHDL, LDLDIRECT in the last 72 hours. Thyroid Function Tests: No results for input(s): TSH, T4TOTAL, FREET4, T3FREE, THYROIDAB in the last 72 hours. Anemia Panel: Recent Labs    09/04/18 0333  VITAMINB12 2,821*  FOLATE 33.5  FERRITIN 82  TIBC 204*  IRON 79   Sepsis Labs: No results for input(s): PROCALCITON, LATICACIDVEN in the last 168 hours.  Recent Results (from the past 240 hour(s))  Urine culture     Status: Abnormal   Collection Time: 08/29/18  3:30 PM  Result Value Ref Range Status   Specimen Description URINE, RANDOM  Final   Special Requests   Final    NONE Performed at [facility], [address] [place], [place] [address]    Culture 20,000 COLONIES/mL ENTEROCOCCUS FAECALIS (A)  Final   Report Status 08/31/2018 FINAL  Final   Organism ID, Bacteria ENTEROCOCCUS FAECALIS (A)  Final      Susceptibility   Enterococcus faecalis - MIC*    AMPICILLIN <=2 SENSITIVE Sensitive     LEVOFLOXACIN >=8 RESISTANT Resistant     NITROFURANTOIN <=16 SENSITIVE Sensitive     VANCOMYCIN 1 SENSITIVE Sensitive     * 20,000 COLONIES/mL ENTEROCOCCUS FAECALIS  MRSA PCR Screening     Status: None    Collection Time: 08/31/18  8:59 AM  Result Value Ref Range Status   MRSA by PCR NEGATIVE NEGATIVE Final    Comment:        The GeneXpert MRSA Assay (FDA approved for NASAL specimens only), is one component of a comprehensive MRSA colonization surveillance program. It is not intended to diagnose MRSA infection nor to guide or monitor treatment for MRSA infections. Performed at [facility], [address] [place], [place] [address]       Radiology Studies: No results found.      Scheduled Meds: . calcium-vitamin D   Oral BID  . ferrous sulfate  325 mg Oral BID WC  . insulin aspart  0-6 Units Subcutaneous Q6H  . lisinopril  40 mg Oral Daily  . metoprolol tartrate  50 mg Oral BID  . multivitamin with minerals  1 tablet Oral Daily  . pravastatin  40 mg Oral Daily  . sodium chloride flush  3 mL Intravenous Q12H  . tetrahydrozoline  1 drop Both Eyes BID  . vitamin B-12  1,000 mcg Oral Daily   Continuous Infusions: . sodium chloride 46 mL/hr at 09/02/18 0000  . sodium chloride 25 mL/hr at 09/04/18 1600  . clevidipine Stopped (09/03/18 0542)  . heparin 700 Units/hr (09/04/18 1114)    Assessment & Plan:    1.  Left MCA stroke secondary to severe, pre occlusive atheromatous stenosis at the left ICA origin: Patient developed severe anemia with dual antiplatelet agents (aspirin and Brilinta) in addition to heparin.  She does have high CHADSVasc Score with underlying paroxysmal A. fib history.  Currently only on IV heparin and statins per neurology recommendations.  Echo showed EF of 60% with no evidence of PFO or intracardiac thrombi.  Will consult vascular surgery for CEA evaluation.  Permissive hypertension (with goal around systolic 140 in the short-term and normal blood pressure in the long-term) given high-grade carotid stenosis.  Continue PT/OT/rehab evaluation.  Speech cleared for regular consistency diet.  2. Acute subsegmental pulmonary embolism in the right upper  lobe: Likely the cause for chest pain.  Currently on IV heparin.  Will need transition to Coumadin once surgical interventions completed.  Pleural lower extremities negative for DVT.  No evidence of PFO on echo.  3.  Metabolic encephalopathy: Secondary to problem #1 likely.  EEG showed generalized background slowing with no seizure activity.  Mental status appears to be improving slowly.  4.  UTI/cellulitis: Completed antibiotic therapy and leukocytosis resolved.  She does have mild leukocytosis on labs today but afebrile.  Chest x-ray negative.  Monitor for now  5.  Diabetes mellitus: Hemoglobin A1c 5.9.  Continue sliding scale insulin until oral intake consistent.  Currently blood glucose 1 30-1 80.  6.  Peripheral vascular disease/carotid artery stenosis: Vascular surgery evaluation as discussed above.  Unable to tolerate antiplatelet agents.  On anticoagulation  7.  Valvular heart disease: Patient status post mitral annuloplasty.  Echocardiogram did show moderate to severely elevated right ventricular systolic pressure, severely elevated mitral diastolic gradient, mild to moderate tricuspid regurgitation and moderate calcification of aortic valve.  8.  Normocytic anemia: Stool for occult blood negative.  Hemoglobin stable around 8.  Bilirubin normal.  Iron studies show ferritin 82, TIBC low at 204, normal iron at 79 and normal percent sat.  B12 and folate levels within normal limits.  Check LDH  9. Hypertension: Continue metoprolol and lisinopril.  Hydralazine PRN  DVT prophylaxis: On IV heparin Code Status: Full code Family / Patient Communication: No family present bedside.  Discussed with neurologist Disposition Plan: Will need rehab.     LOS: 5 days    Time spent: 45 minutes, with greater than 50% in coordination of care, review of records and discussion with neurology.    Alessandra Bevels, MD [facility] Pager [phone]  If 7PM-7AM, please contact night-coverage  [email or web address] Password [facility] 09/05/2018, 5:09 PM

## 2018-09-05 NOTE — Progress Notes (Signed)
ANTICOAGULATION CONSULT NOTE - Follow Up Consult  Pharmacy Consult for Heparin Indication: pulmonary embolus  Allergies  Allergen Reactions  . Meperidine Hcl Nausea And Vomiting  . Sulfonamide Derivatives Nausea And Vomiting  . Demerol [Meperidine]     Unknown per pt     Patient Measurements: Height: 5' (152.4 cm) Weight: 125 lb 3.5 oz (56.8 kg) IBW/kg (Calculated) : 45.5 Heparin Dosing Weight: 56.8 kg  Vital Signs: Temp: 99.1 F (37.3 C) (04/11 0802) Temp Source: Oral (04/11 0802) BP: 183/75 (04/11 0802) Pulse Rate: 78 (04/11 0802)  Labs: Recent Labs    09/03/18 0453  09/04/18 0333 09/04/18 1842 09/05/18 0452 09/05/18 0935  HGB 7.5*   < > 7.8* 8.7* 8.8*  --   HCT 23.2*   < > 24.2* 26.8* 27.0*  --   PLT 244   < > 203 237 227  --   HEPARINUNFRC 0.48  --   --  0.34  --  0.40  CREATININE 0.93  --  0.99  --  0.97  --    < > = values in this interval not displayed.    Estimated Creatinine Clearance: 32.9 mL/min (by C-G formula based on SCr of 0.97 mg/dL).  Assessment:  85 year old female with a history of Afib not on anti-coagulation prior to admission admitted with new CVA.  Also, found to have a pulmonary embolism.  She received TPA 08/31/18 at 8 am.  Pharmacy consulted to begin heparin 24 hours post TPA administration with low heparin goal and no bolus.  Was also on dual antiplatelet therapy with aspirin 81 mg and ticagrelor 90 mg BID.  IR procedure had been planned for 4/8 but postponed x 2 days to date.  Heparin level therapeutic this morning at 0.4 on 700 units/hr.  Hgb improved to 8.8, plts ok, Scr stable, no reports of bleeding  Goal of Therapy:  Heparin level 0.3-0.5 units/ml Monitor platelets by anticoagulation protocol: Yes   Plan:  Continue heparin at 700 units / hr Low therapeutic heparin level goal, no boluses planned. Monitor daily heparin levels, CBC, s/sx bleeding Heparin to be held and MD to be notified if Hgb <7.   Lenward Chancellor, PharmD PGY1  Pharmacy Resident 09/05/2018,12:21 PM

## 2018-09-05 NOTE — Progress Notes (Signed)
Bladder scan showed 797 mL of retained urine. This nurse and charge nurse attempted I/O cath to alleviate urinary retention, without success. Patient stated several times, "it takes specialized nurses" (to perform I/O cath on her). Contacted on-call MD who stated he would have urology see patient today.

## 2018-09-06 ENCOUNTER — Inpatient Hospital Stay (HOSPITAL_COMMUNITY): Payer: Medicare Other

## 2018-09-06 DIAGNOSIS — I6522 Occlusion and stenosis of left carotid artery: Secondary | ICD-10-CM

## 2018-09-06 DIAGNOSIS — Z66 Do not resuscitate: Secondary | ICD-10-CM

## 2018-09-06 DIAGNOSIS — Z515 Encounter for palliative care: Secondary | ICD-10-CM

## 2018-09-06 DIAGNOSIS — R58 Hemorrhage, not elsewhere classified: Secondary | ICD-10-CM

## 2018-09-06 LAB — GLUCOSE, CAPILLARY
Glucose-Capillary: 183 mg/dL — ABNORMAL HIGH (ref 70–99)
Glucose-Capillary: 185 mg/dL — ABNORMAL HIGH (ref 70–99)
Glucose-Capillary: 162 mg/dL — ABNORMAL HIGH (ref 70–99)

## 2018-09-06 LAB — BASIC METABOLIC PANEL
Anion gap: 12 (ref 5–15)
BUN: 33 mg/dL — ABNORMAL HIGH (ref 8–23)
CO2: 18 mmol/L — ABNORMAL LOW (ref 22–32)
Calcium: 8.6 mg/dL — ABNORMAL LOW (ref 8.9–10.3)
Chloride: 110 mmol/L (ref 98–111)
Creatinine, Ser: 1.79 mg/dL — ABNORMAL HIGH (ref 0.44–1.00)
GFR calc Af Amer: 29 mL/min — ABNORMAL LOW (ref >60)
GFR calc non Af Amer: 25 mL/min — ABNORMAL LOW (ref >60)
Glucose, Bld: 207 mg/dL — ABNORMAL HIGH (ref 70–99)
Potassium: 4.7 mmol/L (ref 3.5–5.1)
Sodium: 140 mmol/L (ref 135–145)

## 2018-09-06 LAB — HEPARIN LEVEL (UNFRACTIONATED): Heparin Unfractionated: 0.71 IU/mL — ABNORMAL HIGH (ref 0.30–0.70)

## 2018-09-06 LAB — CBC
HCT: 18.3 % — ABNORMAL LOW (ref 36.0–46.0)
Hemoglobin: 6.1 g/dL — CL (ref 12.0–15.0)
MCH: 33.5 pg (ref 26.0–34.0)
MCHC: 33.3 g/dL (ref 30.0–36.0)
MCV: 100.5 fL — ABNORMAL HIGH (ref 80.0–100.0)
Platelets: 239 10*3/uL (ref 150–400)
RBC: 1.82 MIL/uL — ABNORMAL LOW (ref 3.87–5.11)
RDW: 15.3 % (ref 11.5–15.5)
WBC: 21.3 10*3/uL — ABNORMAL HIGH (ref 4.0–10.5)
nRBC: 0.2 % (ref 0.0–0.2)

## 2018-09-06 LAB — PREPARE RBC (CROSSMATCH)

## 2018-09-06 MED ORDER — LACTATED RINGERS IV SOLN
INTRAVENOUS | Status: DC
  Administered 2018-09-06: 12:00:00 via INTRAVENOUS

## 2018-09-06 MED ORDER — SODIUM CHLORIDE 0.9% IV SOLUTION: Freq: Once | INTRAVENOUS | Status: DC

## 2018-09-06 MED ORDER — LORAZEPAM 2 MG/ML IJ SOLN: 1.0000 mg | INTRAMUSCULAR | Status: DC | PRN

## 2018-09-06 MED ORDER — MORPHINE SULFATE (PF) 2 MG/ML IV SOLN: 1.0000 mg | INTRAVENOUS | Status: DC | PRN

## 2018-09-06 NOTE — Consult Note (Signed)
Consultation Note Date: 09/06/2018   Patient Name: Emily Daniel  DOB: [birth year]  MRN: [id number]  Age / Sex: 85 y.o., female  PCP: Zola Button, Grayling Congress, DO Referring Physician: Alessandra Bevels, MD  Reason for Consultation: Establishing goals of care and Psychosocial/spiritual support  HPI/Patient Profile: 85 y.o. female  admitted on 08/29/2018 with   past medical history significant for CAD status post CABG, PAD, CHF, paroxysmal atrial fibrillation not anticoagulated due to known AVMs and GI bleed.  On April 4 patient was seen in the emergency room for UTI/left lower extremity cellulitis/secondary to tick bite per her family, patient was released to home and finished antibiotic course with amoxicillin/doxycycline for enterococcal UTI/cellulitis.   On April 6 patient had chest pain with EKG showing ST changes changes followed by emesis/global aphasia/right facial and right-sided hemiparesis.  CT head showed acute on chronic frontal infarcts, MRI of the head showed moderate left greater than right cerebral convexity watershed infarcts with petechial hemorrhages on the left.   She was seen by neurology and received TPA on April 6 at 7:56 AM.  CTA of the head and neck showed preocclusive stenosis of left internal carotid artery as well as right upper lobe PE.  Cerebral angiogram showed left ICA proximal 90% stenosis.     She was also noted to have acute PE during hospitalization has been on IV heparin.  This is currently being held for ongoing transfusion due to hemoglobin of 6.1 this morning.   Hospitalization now complicated with low hemoglobin today 6.1 currently transfusing 1 unit of packed RBCs.  Patient has decompensated, is weak. CT abdomen/pelvis revealed large retroperitoneal hematoma likely explaining her in..  Family face treatment option decisions, advanced directive decisions and anticipatory care  needs.  Clinical Assessment and Goals of Care:  This NP Lorinda Creed reviewed medical records, received report from team, assessed the patient and then spoke to both sons by conference call to discuss diagnosis, prognosis, GOC, EOL wishes disposition and options.  Concept of Hospice and Palliative Care were discussed  A detailed discussion was had today regarding advanced directives.  Concepts specific to code status, artifical feeding and hydration, continued IV antibiotics and rehospitalization was had.  The difference between a aggressive medical intervention path  and a palliative comfort care path for this patient at this time was had.  Values and goals of care important to patient and family were attempted to be elicited.  Family understand the seriousness of the medical situation and believes that a shift to a full comfort path is in the patient's best interest, allowing for a natural death.  Natural trajectory and expectations at EOL were discussed.  Questions and concerns addressed.   Family encouraged to call with questions or concerns.    PMT will continue to support holistically.    HCPOA/ Robert Hattabaugh/son    SUMMARY OF RECOMMENDATIONS    Code Status/Advance Care Planning:  DNR   Focus of care is comfort, quality and dignity-both sons understand the seriousness of the current medical  situation and are comfortable with decision to shift to a full comfort path.  They understand their mother's wishes that she did not want aggressive medical medical interventions and her hope was for a natural death.  Family hopes for a "comfortable passing"   Symptom Management:   Pain/Dyspnea: Morphine 1 mg IV every 1 hour as needed/consider continuous infusion for comfort   Agitation/Aniety: Ativan 1 mg IV every 4 hours as needed  Dysphagia: diet as tolerated with known risk of aspiration   Palliative Prophylaxis:   Aspiration, Bowel Regimen, Delirium Protocol, Frequent Pain  Assessment and Oral Care  Additional Recommendations (Limitations, Scope, Preferences):  Full Comfort Care  Psycho-social/Spiritual:   Desire for further Chaplaincy support:yes  Additional Recommendations: Education on Hospice  Prognosis:   Hours - Days  Discharge Planning: Anticipated Hospital Death      Primary Diagnoses: Present on Admission: . Cellulitis of left leg without foot . Acute lower UTI . Acute metabolic encephalopathy . DM (diabetes mellitus) type II uncontrolled, periph vascular disorder (HCC) . Hyperlipidemia . Anxiety state . Essential hypertension . Atrial fibrillation (HCC) . Acute ischemic left MCA stroke (HCC)   I have reviewed the medical record, interviewed the patient and family, and examined the patient. The following aspects are pertinent.  Past Medical History:  Diagnosis Date  . Anxiety   . Atrial fibrillation (HCC)    post op >>> req'd Amio and Coumadin  . AVM (arteriovenous malformation)   . CAD (coronary artery disease)    a. NSTEMI 10/2013 >>> LHC (10/2013):  Distal left main 90%, proximal LAD 30%, mid LAD 20%, ostial circumflex 99%, then 80%, mid RCA 99%, EF 30-35%, 3-4+ MR; proximal right iliac occluded, right renal 50%, ostial left iliac 70% >>> CABG/MV repair  . Cataract   . CHF (congestive heart failure) (HCC)    Dr. Dorris Fetch following  . Chronic systolic heart failure (HCC)   . Diabetes mellitus, type 2 (HCC)   . Hemorrhoids   . Hx of echocardiogram    Echo (9/15):  Mild LVH, EF 40-45%, inf AK, Gr 1 DD, trivial AI, MV repair ok (mild MS, mild MR), mild LAE, PASP 32 mmHg.  [name] Hyperlipidemia   . Hypertension   . Ischemic cardiomyopathy    a. Echo (11/08/13):  EF 30-35%, diffuse hypokinesis, severe MR, mild LAE, PASP 48 mm Hg  . Mitral regurgitation    a. s/p MV repair at time of CABG  . Osteoporosis   . PVD (peripheral vascular disease) (HCC)   . Vitamin B12 deficiency    Social History   Socioeconomic History  .  Marital status: Widowed    Spouse name: Not on file  . Number of children: Not on file  . Years of education: Not on file  . Highest education level: Not on file  Occupational History  . Occupation: retired    Associate Professor: RETIRED  Social Needs  . Financial resource strain: Not on file  . Food insecurity:    Worry: Not on file    Inability: Not on file  . Transportation needs:    Medical: Not on file    Non-medical: Not on file  Tobacco Use  . Smoking status: Former Smoker    Packs/day: 1.00    Years: 36.00    Pack years: 36.00    Types: Cigarettes    Last attempt to quit: 02/18/1984    Years since quitting: 34.5  . Smokeless tobacco: Never Used  Substance and Sexual Activity  .  Alcohol use: No  . Drug use: No  . Sexual activity: Not Currently  Lifestyle  . Physical activity:    Days per week: Not on file    Minutes per session: Not on file  . Stress: Not on file  Relationships  . Social connections:    Talks on phone: Not on file    Gets together: Not on file    Attends religious service: Not on file    Active member of club or organization: Not on file    Attends meetings of clubs or organizations: Not on file    Relationship status: Not on file  Other Topics Concern  . Not on file  Social History Narrative   Exercise-- yard, house work   Family History  Problem Relation Age of Onset  . Cervical cancer Mother   . Cancer Brother        renal ...x3??  [name] Hypertension Other   . Stroke Other   . Hypertension Sister   . Heart attack Neg Hx    Scheduled Meds: . sodium chloride   Intravenous Once  . calcium-vitamin D   Oral BID  . ferrous sulfate  325 mg Oral BID WC  . insulin aspart  0-6 Units Subcutaneous Q6H  . multivitamin with minerals  1 tablet Oral Daily  . pravastatin  40 mg Oral Daily  . sodium chloride flush  3 mL Intravenous Q12H  . tetrahydrozoline  1 drop Both Eyes BID   Continuous Infusions: . sodium chloride 46 mL/hr at 09/02/18 0000  . sodium  chloride 25 mL/hr at 09/04/18 1600  . clevidipine Stopped (09/03/18 0542)   PRN Meds:.sodium chloride, iohexol, labetalol, nitroGLYCERIN, [DISCONTINUED] ondansetron **OR** ondansetron (ZOFRAN) IV, sodium chloride flush Medications Prior to Admission:  Prior to Admission medications   Medication Sig Start Date End Date Taking? Authorizing Provider  Calcium Carbonate-Vitamin D (CALCIUM-VITAMIN D) 600-200 MG-UNIT CAPS Take 1 capsule by mouth 2 (two) times daily.    Yes [provider]  Casanthranol-Docusate Sodium 30-100 MG CAPS Take 1 capsule by mouth daily as needed (for constipation).    Yes [provider]  ferrous sulfate 325 (65 FE) MG tablet Take 325 mg by mouth 2 (two) times daily with a meal.    Yes [provider]  lisinopril (PRINIVIL,ZESTRIL) 40 MG tablet TAKE 1 TABLET BY MOUTH ONCE DAILY Patient taking differently: Take 40 mg by mouth daily.  08/25/17  Yes Iran Ouch, MD  Multiple Vitamin (MULTIVITAMIN) tablet Take 1 tablet by mouth daily.     Yes [provider]  pravastatin (PRAVACHOL) 40 MG tablet Take 1 tablet (40 mg total) by mouth daily. 04/15/18  Yes Seabron Spates R, DO  sitaGLIPtin (JANUVIA) 100 MG tablet Take 1 tablet (100 mg total) by mouth daily. 04/28/18  Yes Seabron Spates R, DO  Tetrahydrozoline HCl (VISINE OP) Apply 1 drop to eye daily as needed (dry eyes).   Yes [provider]  vitamin B-12 (CYANOCOBALAMIN) 1000 MCG tablet Take 1,000 mcg by mouth daily.     Yes [provider]  acetaminophen (TYLENOL) 325 MG tablet Take 2 tablets (650 mg total) by mouth every 6 (six) hours as needed for mild pain or headache (or Fever >/= 101). 08/30/18   Emokpae, Courage, MD  aspirin (ECOTRIN LOW STRENGTH) 81 MG EC tablet Take 1 tablet (81 mg total) by mouth daily with breakfast. 08/30/18   Mariea Clonts, Courage, MD  metoprolol tartrate (LOPRESSOR) 50 MG tablet Take 1  tablet (50 mg total) by mouth 2 (two) times daily. 08/30/18    Shon HaleEmokpae, Courage, MD   Allergies  Allergen Reactions  . Meperidine Hcl Nausea And Vomiting  . Sulfonamide Derivatives Nausea And Vomiting  . Demerol [Meperidine]     Unknown per pt    Review of Systems  Unable to perform ROS: Patient nonverbal    Physical Exam Constitutional:      General: She is awake.     Appearance: She is ill-appearing.     Interventions: Nasal cannula in place.  Cardiovascular:     Rate and Rhythm: Tachycardia present.  Pulmonary:     Breath sounds: Decreased air movement present.  Skin:    General: Skin is warm and dry.     Coloration: Skin is pale.     Vital Signs: BP 125/65   Pulse 71   Temp (!) 97.5 F (36.4 C) (Axillary)   Resp (!) 22   Ht 5' (1.524 m)   Wt 56.8 kg   SpO2 100%   BMI 24.46 kg/m  Pain Scale: 0-10   Pain Score: Asleep   SpO2: SpO2: 100 % O2 Device:SpO2: 100 % O2 Flow Rate: .O2 Flow Rate (L/min): 1 L/min  IO: Intake/output summary:   Intake/Output Summary (Last 24 hours) at 09/06/2018 1046 Last data filed at 09/06/2018 0800 Gross per 24 hour  Intake 769.25 ml  Output 500 ml  Net 269.25 ml    LBM: Last BM Date: 09/04/18 Baseline Weight: Weight: 57.6 kg Most recent weight: Weight: 56.8 kg     Palliative Assessment/Data: 30%   Discussed with Dr Lajuana RippleKamineni  Time In: 1150 Time Out: 1300 Time Total: 70 minutes Greater than 50%  of this time was spent counseling and coordinating care related to the above assessment and plan.  Signed by: Lorinda CreedMary Jerre Diguglielmo, NP   Please contact Palliative Medicine Team phone at [phone] for questions and concerns.  For individual provider: See Loretha StaplerAmion

## 2018-09-06 NOTE — Progress Notes (Signed)
STROKE TEAM PROGRESS NOTE   INTERVAL HISTORY Pt lying in bed, eyes open, awake alert, able to say yes and okay, however no any other language output.  Hemoglobin 6.1 this a.m., received blood transfusion.  CT head no change, however CT of the abdomen and pelvis showed large retroperitoneal hematoma.  Heparin discontinued.   Vitals:   09/06/18 0809 09/06/18 0816 09/06/18 0845 09/06/18 1100  BP: (!) 99/47 108/62 125/65 129/79  Pulse: 71 70 71 71  Resp: (!) 22 (!) 22 (!) 22 20  Temp:  97.6 F (36.4 C) (!) 97.5 F (36.4 C) 97.6 F (36.4 C)  TempSrc:  Axillary Axillary Axillary  SpO2: 100% 100% 100% 100%  Weight:      Height:        CBC:  Recent Labs  Lab 09/03/18 0453  09/05/18 0452 09/06/18 0512  WBC 10.6*   < > 11.2* 21.3*  NEUTROABS 7.0  --   --   --   HGB 7.5*   < > 8.8* 6.1*  HCT 23.2*   < > 27.0* 18.3*  MCV 102.2*   < > 97.5 100.5*  PLT 244   < > 227 239   < > = values in this interval not displayed.    Basic Metabolic Panel:  Recent Labs  Lab 09/05/18 0452 09/06/18 0512  NA 142 140  K 4.1 4.7  CL 112* 110  CO2 20* 18*  GLUCOSE 128* 207*  BUN 22 33*  CREATININE 0.97 1.79*  CALCIUM 8.9 8.6*   Lipid Panel:     Component Value Date/Time   CHOL 134 09/01/2018 0308   TRIG 85 09/01/2018 0308   HDL 38 (L) 09/01/2018 0308   CHOLHDL 3.5 09/01/2018 0308   VLDL 17 09/01/2018 0308   LDLCALC 79 09/01/2018 0308   HgbA1c:  Lab Results  Component Value Date   HGBA1C 5.9 (H) 09/01/2018   Urine Drug Screen:     Component Value Date/Time   LABOPIA NONE DETECTED 08/25/2018 1945   COCAINSCRNUR NONE DETECTED 08/25/2018 1945   LABBENZ NONE DETECTED 08/25/2018 1945   AMPHETMU NONE DETECTED 08/25/2018 1945   THCU NONE DETECTED 08/25/2018 1945   LABBARB NONE DETECTED 08/25/2018 1945    Alcohol Level     Component Value Date/Time   ETH <10 08/25/2018 1736    IMAGING  Mr Brain Wo Contrast Result Date: 09/01/2018 CLINICAL DATA:  Focal neuro deficit. EXAM: MRI  HEAD WITHOUT CONTRAST TECHNIQUE: Multiplanar, multiecho pulse sequences of the brain and surrounding structures were obtained without intravenous contrast. COMPARISON:  Head CT and CTA from yesterday FINDINGS: Brain: Overall moderate area of patchy acute infarction along the left more than right high cerebral convexities which could be embolic or watershed. There is a pre-existing smaller area of infarct at the high left frontal parietal convexity. There is petechial hemorrhage associated with a left-sided infarction. Patchy mainly peripheral remote microhemorrhages, suspicious for amyloid angiopathy. No acute hematoma, hydrocephalus, or masslike finding. Confluent chronic small vessel ischemic gliosis in the periventricular white matter. Small remote bilateral cerebellar infarcts. Vascular: Major flow voids are preserved.  There was CTA yesterday Skull and upper cervical spine: Negative for marrow lesion Sinuses/Orbits: Bilateral cataract resection  IMPRESSION:  1. Moderate area of acute infarction along the left more than right high cerebral convexity, likely watershed. Mild petechial hemorrhage on the left  2. Background of extensive chronic small vessel ischemia in the cerebral white matter.  3. Scattered remote micro hemorrhages suspicious for amyloid angiopathy.  Dg Chest Port 1 View Result Date: 09/02/2018 CLINICAL DATA:  Leukocytosis EXAM: PORTABLE CHEST 1 VIEW COMPARISON:  09/01/2018 FINDINGS: Cardiac shadow remains enlarged. Postsurgical changes are again. Aortic calcifications are again seen. The lungs are well aerated bilaterally. No focal infiltrate or sizable effusion is seen. No acute bony abnormality is noted.  IMPRESSION:  No active disease.   CT Head Wo Contrast 09/06/2018 IMPRESSION: Cortical acute/subacute infarct in the high left frontal lobe is unchanged from the CT of 08/31/2018. Negative for hemorrhage. No new findings.    CT Abdomen and Pelvis 09/06/2018 IMPRESSION: 1.  Large retroperitoneal hematoma on the right extending into the pelvis and psoas muscle. This is likely the cause of anemia. The hematoma appears subacute to chronic. Note recent cerebral angiogram 09/01/2018 was via left femoral approach. 2. Small bilateral pleural effusions. 3. Atherosclerotic aorta without aneurysm.   EEG This is an abnormal EEG secondary to general background slowing.  This finding may be seen with a diffuse disturbance that is etiologically nonspecific, but may include a metabolic encephalopathy.  Can not rule out normal drowse as well.  No epileptiform activity was noted.      2D Echocardiogram bubble  1. The left ventricle has normal systolic function, with an ejection fraction of 60-65%. The cavity size was normal. Left ventricular diastolic function could not be evaluated due to nondiagnostic images.  2. The right ventricle has normal systolc function. The cavity was normal. There is no increase in right ventricular wall thickness.  3. RVSP moderate-severely elevated, with range of 43 mmHg-55 mmHg if RA pressures is estimated bewteen 3-15 mmHg. IVC not assessed on today's exam.  4. A 30 mm Sorin memo 3D annuloplasty ring is present in the mitral position Dorris Fetch 11/11/2013). No dehiscence.  5. Mitral diastolic prosthetic gradient severely elevated, 10 mmHg (HR 83 bpm). Trivial mitral valve regurgitation. No pannus or thrombus noted on ring.  6. Tricuspid valve regurgitation is mild-moderate.  7. The aortic valve is abnormal Moderate calcification of the aortic valve. Aortic valve regurgitation is trivial by color flow Doppler.  8. Severly calcified aorta sinotubular junction, prominent on posterior portion of ST junction.  9. No intracardiac thrombi or masses were visualized. Saline contrast bubble study was negative, with no evidence of any interatrial shunt.  Ct Abdomen Pelvis Wo Contrast 09/06/2018 CLINICAL DATA:  Recurring anemia. EXAM: CT ABDOMEN AND PELVIS  WITHOUT CONTRAST TECHNIQUE: Multidetector CT imaging of the abdomen and pelvis was performed following the standard protocol without IV contrast. COMPARISON:  CT abdomen pelvis 03/29/2009 FINDINGS: Lower chest: Small pleural effusions bilaterally. Mild cardiac enlargement. Atherosclerotic calcification in the thoracic aorta. No infiltrate in the lung bases. Hepatobiliary: No focal liver abnormality is seen. No gallstones, gallbladder wall thickening, or biliary dilatation. Pancreas: Pancreatic atrophy.  Negative for mass or edema Spleen: Small spleen without focal abnormality Adrenals/Urinary Tract: Normal kidneys. No renal mass or obstruction. No renal calculi. Foley catheter in the bladder which is empty. Stomach/Bowel: Negative for bowel obstruction. No bowel mass or edema. Prior appendectomy. Vascular/Lymphatic: Atherosclerotic calcification aorta and iliac arteries without aneurysm. No lymphadenopathy Reproductive: Hysterectomy.  No pelvic mass. Other: Large retroperitoneal mass on the right compatible with hematoma which appears chronic. This is diffusely low density with surrounding stranding in the soft tissues. This is extraperitoneal. The hematoma measures 11 x 6.5 x 17 cm. Hematoma extends into the right psoas muscle. Small amount of free fluid in the pelvis. Musculoskeletal: Moderate compression fracture T12 appears chronic. Mild compression fracture superior endplate  of L3 appears chronic. No acute skeletal lesion. IMPRESSION: 1. Large retroperitoneal hematoma on the right extending into the pelvis and psoas muscle. This is likely the cause of anemia. The hematoma appears subacute to chronic. Note recent cerebral angiogram 09/01/2018 was via left femoral approach. 2. Small bilateral pleural effusions. 3. Atherosclerotic aorta without aneurysm. Electronically Signed: By: Marlan Palau M.D. On: 09/06/2018 11:29   Ct Head Wo Contrast  Result Date: 09/06/2018 CLINICAL DATA:  Encephalopathy.  Stroke.  EXAM: CT HEAD WITHOUT CONTRAST TECHNIQUE: Contiguous axial images were obtained from the base of the skull through the vertex without intravenous contrast. COMPARISON:  CT head 08/31/2018 FINDINGS: Brain: Cortical hypodensity in the left frontal convexity unchanged in extent from prior CT compatible with acute/subacute infarction. No associated hemorrhage Moderate atrophy. Extensive chronic microvascular ischemic change in the white matter. Small chronic infarcts in the cerebellum bilaterally. Vascular: Negative for hyperdense vessel Skull: Negative Sinuses/Orbits: Negative Other: None IMPRESSION: Cortical acute/subacute infarct in the high left frontal lobe is unchanged from the CT of 08/31/2018. Negative for hemorrhage. No new findings. Electronically Signed   By: Marlan Palau M.D.   On: 09/06/2018 09:51      PHYSICAL EXAM  Temp:  [97.5 F (36.4 C)-99.3 F (37.4 C)] 97.6 F (36.4 C) (04/12 1100) Pulse Rate:  [70-89] 71 (04/12 1100) Resp:  [14-22] 20 (04/12 1100) BP: (82-199)/(44-120) 129/79 (04/12 1100) SpO2:  [95 %-100 %] 100 % (04/12 1100)  General - Well nourished, well developed, pale, not in acute distress.  Ophthalmologic - fundi not visualized due to noncooperation.  Cardiovascular - Regular rate and rhythm, not in afib.  Neuro - eyes open, awake alert, pale, able to say yes and okay, did not answer orientation questions. Blinking to visual threat to left not to right, but no gaze deviation, able to attend to both sides, able to track bilaterally. PERRL, EOMI. Right nasolabial fold mild flattening. LUE 3/5 and LLE 2/5, RUE 0/5 with increased muscle tone. RLE withdraw to pain. DTR 1+ and right babinski. Sensation, coordination not cooperative and gait not tested.   ASSESSMENT/PLAN Emily Daniel is a 85 y.o. female with history of CAD S/P CABG, PAD, HFrEF, paroxysmal A. Fib. not on anticoagulation, AVMs, and multiple areas of small frontal infarcts on MRI was admitted on  08/29/18 due to confusion and worsening mental status. Treated for UTI and cellulitis. In hospital, she developed CP w/o ST changes and treated w/ morphine. 1 hr later emesis. Then noted global aphasia, R facial and R hemiparesis. Received IV tPA 08/31/2018 at 0756.  Stroke: Left MCA and bilateral ACA scattered infarcts, consistent with left ICA proximal high-grade stenosis (azygous ACAs) and AF not on AC  Code Stroke CT head acute on chronic high L infarcts. ASPECTS 8    CTA head & neck no ELVO. Severe, pre occlusive stenosis L ICA origin. RUL PE.  Azygous ACAs coming out from left ICA terminal  CXR CM and vascular congestion  MRI  Moderate L>R high cerebral convexity watershed infarcts. Mild PH on the L. Small vessel disease. Micro hmgs suspicious for CAA.  CT Abdomen and Pelvis (09/06/2018) - Large retroperitoneal hematoma on the right extending into the pelvis and psoas muscle.  CT Head Repeat (09/06/2018) - unchanged   2D Echo EF 60-65%. No source of embolus. No clot/mass. Bubble study neg.   Cerebral angio left ICA proximal 90%+ stenosis  EEG generalized background slowing. No sz.  LDL 79  HgbA1c 5.9  P2Y12 932  SCDs for VTE prophylaxis  aspirin 81 mg daily prior to admission, off antithrombotics due to severe anemia needing blood transfusion x 2.   Therapy recommendations:  CIR  Disposition:  Pending  Palliative care involved today -> shift to comfort care path  Chest Pain/ RUL PE  LE dopplers neg DVT  Received IV tPA 11/30/2018 for stroke  2D Echo EF 60-65%. No source of embolus. No clot/mass. Bubble study neg. Mult cardiac abnormalities.  Discontinued heparin IV due to severe symptomatic anemia needing blood transfusion x2  Atrial Fibrillation  Home anticoagulation:  none   family not eager to start [facility] at time of admission . Currently in NSR . On metoprolol  . Discontinued heparin IV due to severe symptomatic anemia needing blood transfusion x2   Severe  anemia due to retroperitoneal hematoma  Hgb 11.9->10.3->9.9->7.5->7.1-PRBC->7.8->8.8->6.1->PRBC  iron panel - TIBC 204, Sat 39  B12 - 2821  folate and ferritin - ok   Continue iron po  Stool occult blood negative on 09/04/2018  Known ascending one single colon AVM and internal hemorrhoids  CT abdomen pelvis showed large retroperitoneal hematoma  Carotid Stenosis  CTA L ICA 90% stenosis  Confirmed on cerebral angio  Was on ASA and brilinta, but developed severe anemia, DAPT was discontinued  Resumed the heparin IV after PRBC transfusion, then again developed severe anemia, heparin IV discontinued  Palliative care involved -> comfort care path  Leukocytosis  UTI with enterococcus faecalis  LLE cellulitis   WBC 7.5->12.3->14.2->10.6->9.7->11.2->21.3 (afebrile)  Treated with full course amoxicillin 500 q 8h & doxycycline 100 bid po  UA 08/29/18 neg  UA 09/02/18 neg  UCx 20K enterococcus faecalis (sensitive to Vancomycin, Ampicillin, and Nitrofurantoin. Resistant to Levofloxacin)  CXR 09/02/18 no active disease  Hypertension  BP on the low end  Home meds:  Lisinopril 40, metoprolol 50 bid Treated w/ labetolol and cleviprex to keep in goal Off cleviprex now  . Long-term BP goal 130-150 given left ICA high grade stenosis  Hyperlipidemia  Home meds:  pravachol 40, resumed in hospital  LDL 79, goal < 70  Continue pravastatin 40  Diabetes type II Controlled w/ PVD  Home meds:  januvia 100  HgbA1c 5.9, goal < 7.0  CBGs  SSI  Other Stroke Risk Factors  Advanced age  Former Cigarette smoker, quit 71 y ago  Family hx stroke (other)  Coronary artery disease s/p NSTEMI, CABG  Chronic systolic Congestive heart failure  Ischemic cardiomyopathy   Mitral regurg s/p repair at time of CABG  PVD  Other Active Problems  LLE cellulitis. LE dopplers neg for DVT. Treated with course doxycycline 100 bid po  Renal insufficiency, resolved Cr  1.07->1.13->0.98->0.93->0.97->1.79   Hospital day # 6  Neurology will sign off. Please call with questions. If pt comfort care reversed back to full medical care, please call us back. Thanks for the consult.   Marvel Plan, MD PhD Stroke Neurology 09/06/2018 2:26 PM  I spent  35 minutes in total face-to-face time with the patient, more than 50% of which was spent in counseling and coordination of care, reviewing test results, images and medication, and discussing the diagnosis of severe anemia, retroperitoneal hematoma, stroke, A. fib and PE, left carotid high-grade stenosis, treatment plan and potential prognosis. This patient's care requiresreview of multiple databases, neurological assessment, discussion with family, other specialists and medical decision making of high complexity.          To contact Stroke Continuity provider, please refer to [email or web address]. After hours,  contact General Neurology

## 2018-09-06 NOTE — Evaluation (Signed)
Clinical/Bedside Swallow Evaluation Patient Details  Name: Emily Daniel MRN: [id number] Date of Birth: [birth year]  Today's Date: 09/06/2018 Time: SLP Start Time (ACUTE ONLY): 1102 SLP Stop Time (ACUTE ONLY): 1123 SLP Time Calculation (min) (ACUTE ONLY): 21 min  Past Medical History:  Past Medical History:  Diagnosis Date  . Anxiety   . Atrial fibrillation (HCC)    post op >>> req'd Amio and Coumadin  . AVM (arteriovenous malformation)   . CAD (coronary artery disease)    a. NSTEMI 10/2013 >>> LHC (10/2013):  Distal left main 90%, proximal LAD 30%, mid LAD 20%, ostial circumflex 99%, then 80%, mid RCA 99%, EF 30-35%, 3-4+ MR; proximal right iliac occluded, right renal 50%, ostial left iliac 70% >>> CABG/MV repair  . Cataract   . CHF (congestive heart failure) (HCC)    Dr. Dorris FetchHendrickson following  . Chronic systolic heart failure (HCC)   . Diabetes mellitus, type 2 (HCC)   . Hemorrhoids   . Hx of echocardiogram    Echo (9/15):  Mild LVH, EF 40-45%, inf AK, Gr 1 DD, trivial AI, MV repair ok (mild MS, mild MR), mild LAE, PASP 32 mmHg.  [name]. Hyperlipidemia   . Hypertension   . Ischemic cardiomyopathy    a. Echo (11/08/13):  EF 30-35%, diffuse hypokinesis, severe MR, mild LAE, PASP 48 mm Hg  . Mitral regurgitation    a. s/p MV repair at time of CABG  . Osteoporosis   . PVD (peripheral vascular disease) (HCC)   . Vitamin B12 deficiency    Past Surgical History:  Past Surgical History:  Procedure Laterality Date  . ABDOMINAL HYSTERECTOMY     1969  . APPENDECTOMY     1945  . CORONARY ARTERY BYPASS GRAFT  1990, 1995, 1998  . HEMORRHOID SURGERY     1959  . IR ANGIO INTRA EXTRACRAN SEL COM CAROTID INNOMINATE BILAT MOD SED  09/01/2018  . IR ANGIO VERTEBRAL SEL SUBCLAVIAN INNOMINATE UNI R MOD SED  09/01/2018  . IR ANGIO VERTEBRAL SEL VERTEBRAL UNI L MOD SED  09/01/2018  . IR PATIENT EVAL TECH 0-60 MINS  08/31/2018  . LEFT AND RIGHT HEART CATHETERIZATION WITH CORONARY ANGIOGRAM N/A 11/10/2013    Procedure: LEFT AND RIGHT HEART CATHETERIZATION WITH CORONARY ANGIOGRAM;  Surgeon: Lennette Biharihomas A Kelly, MD;  [facility];  Service: Cardiovascular;  Laterality: N/A;  . MITRAL VALVE REPLACEMENT (MVR)/CORONARY ARTERY BYPASS GRAFTING (CABG) N/A 11/11/2013   Procedure: MITRAL VALVE Ring repair (MVR)/CORONARY ARTERY BYPASS GRAFTING (CABG) times three using left internal mammary and left sphenous vein.;  Surgeon: Loreli SlotSteven C Hendrickson, MD;  Location: [facility] OR;  Service: Open Heart Surgery;  Laterality: N/A;  Coronary artery bypass graft times three using left internal mammary artery and left saphenous leg vein using endoscope.  Exploration of right leg.    HPI:  [name] with PMH: anxiety, CAD s/p CABG, peripheral artery disease, CHF, DM, intermittent atrial fibrillation not on anticoagulation, AVM's, hyperlipidemia and prior tobacco abuse, and multiple areas of old small frontal infarctions on MRI, who presented to ED with confusion  with MR of brain showing moderate area of acute infarction left more than right at the high cerebral convexities which could be embolic or watershed.   Assessment / Plan / Recommendation Clinical Impression   Patient presents with apparent decline in function; has been followed by SLP for communication deficits but had passed [employer] Swallow Screen and was on regular diet. SLP consulted today as pt  has had poor intake and RN concerned for aspiration. She had an episode of regurgitation/vomiting today after taking meds. Pt non-verbal during today's session despite max cues and attempts to elicit Y/N responses were ineffective; this is a decline from communication abilities noted in last ST session. Sustained attention impaired and impacts self-feeding, and pt unable to self feed, appears to have ideational apraxia when attempting to use straw or spoon with SLP assist. Oral holding with liquids, although airway protection appears intact. With dentures in place, pt  with prolonged bolus formation with puree and soft solids, extended time required for mastication and clearance. Based on the above, and MD's documentation that family considering comfort measures and do not want artificial feeding, would downgrade to dys 1 (puree) and thin liquids, meds crushed in puree. Pt will need full supervision and assistance for feeding. At time of documenting this note, SLP notes pt now full comfort measure per palliative care. No further skilled ST needs identified at this time. Will s/o.   SLP Visit Diagnosis: Dysphagia, unspecified (R13.10)    Aspiration Risk  Moderate aspiration risk    Diet Recommendation Dysphagia 1 (Puree);Thin liquid   Liquid Administration via: Cup Medication Administration: Crushed with puree Supervision: Full supervision/cueing for compensatory strategies;Staff to assist with self feeding Compensations: Slow rate;Small sips/bites    Other  Recommendations Oral Care Recommendations: Oral care QID   Follow up Recommendations None      Frequency and Duration Other (Comment)(no further ST recommended as pt now full comfort measures)          Prognosis Prognosis for Safe Diet Advancement: Guarded Barriers to Reach Goals: Cognitive deficits      Swallow Study   General Date of Onset: 08/29/18 HPI: [name] with PMH: anxiety, CAD s/p CABG, peripheral artery disease, CHF, DM, intermittent atrial fibrillation not on anticoagulation, AVM's, hyperlipidemia and prior tobacco abuse, and multiple areas of old small frontal infarctions on MRI, who presented to ED with confusion  with MR of brain showing moderate area of acute infarction left more than right at the high cerebral convexities which could be embolic or watershed. Type of Study: Bedside Swallow Evaluation Previous Swallow Assessment: none in chart Diet Prior to this Study: Regular;Thin liquids Temperature Spikes Noted: No Respiratory Status: Room air History  of Recent Intubation: No Behavior/Cognition: Alert;Requires cueing;Distractible Oral Cavity Assessment: (lingual surface with yellow-white coating) Oral Care Completed by SLP: Yes Oral Cavity - Dentition: Dentures, top Self-Feeding Abilities: Total assist Patient Positioning: Upright in bed Baseline Vocal Quality: Not observed Volitional Cough: Cognitively unable to elicit Volitional Swallow: Unable to elicit    Oral/Motor/Sensory Function Overall Oral Motor/Sensory Function: Within functional limits   Ice Chips Ice chips: Not tested   Thin Liquid Thin Liquid: Impaired Presentation: Cup;Straw Oral Phase Impairments: Reduced labial seal Oral Phase Functional Implications: Oral holding;Right anterior spillage    Nectar Thick Nectar Thick Liquid: Not tested   Honey Thick Honey Thick Liquid: Not tested   Puree Puree: Impaired Oral Phase Functional Implications: Prolonged oral transit;Oral holding   Solid     Solid: Impaired Oral Phase Impairments: Impaired mastication Oral Phase Functional Implications: Prolonged oral transit;Impaired mastication;Oral residue     Rondel Baton, MS, [employer] Speech-Language Pathologist Acute Rehabilitation Services Pager: [phone] Office: [phone]  Emily Daniel 09/06/2018,1:08 PM

## 2018-09-06 NOTE — Progress Notes (Signed)
ANTICOAGULATION CONSULT NOTE  Pharmacy Consult for Heparin Indication: pulmonary embolus  Patient Measurements: Height: 5' (152.4 cm) Weight: 125 lb 3.5 oz (56.8 kg) IBW/kg (Calculated) : 45.5 Heparin Dosing Weight: 56.8 kg  Vital Signs: Temp: 97.6 F (36.4 C) (04/12 0816) Temp Source: Axillary (04/12 0816) BP: 108/62 (04/12 0816) Pulse Rate: 70 (04/12 0816)  Assessment:  85 year old female with a history of Afib not on anti-coagulation prior to admission admitted with new CVA.  Also, found to have a pulmonary embolism.  She received TPA on 08/31/18.  Pharmacy consulted to begin heparin 24 hours post TPA administration with low heparin goal and no bolus.  Was also on dual antiplatelet therapy with aspirin 81 mg and ticagrelor 90 mg BID.  IR procedure had been planned for 4/8 but now postponed.  Heparin level was supratherapeutic this morning. Patient also with anemia, hgb down to 6.1.  Goal of Therapy:  Heparin level 0.3-0.5 units/ml Monitor platelets by anticoagulation protocol: Yes   Plan:  -Heparin currently on hold per neurology given anemia -F/u plan to resume anticoagulation + dual antiplatelet therapy   Baldemar Friday 09/06/2018 8:46 AM

## 2018-09-06 NOTE — Consult Note (Addendum)
Hospital Consult    Reason for Consult:  L ICA high grade stenosis, L CVA MRN #:  [id number]  History of Present Illness: This is a 85 y.o. female with past medical history significant for CAD status post CABG, PAD, CHF, paroxysmal atrial fibrillation not anticoagulated due to known AVMs and GI bleed.  She is being seen in consultation for evaluation of high-grade left ICA stenosis and left CVA.  All history was obtained from chart review as patient is now nonverbal and only responding with head nodding.  She was also noted to have acute PE during hospitalization has been on IV heparin.  This is currently being held for ongoing transfusion due to hemoglobin of 6.1 this morning.  Work-up has included a CTA which demonstrated about a 90% stenosis of left ICA origin.  Per nursing staff she had been verbal and more involved however now is nonverbal and only able to squeeze her left hand and nod yes to questions.  Her son is her family contact.  Past Medical History:  Diagnosis Date  . Anxiety   . Atrial fibrillation (HCC)    post op >>> req'd Amio and Coumadin  . AVM (arteriovenous malformation)   . CAD (coronary artery disease)    a. NSTEMI 10/2013 >>> LHC (10/2013):  Distal left main 90%, proximal LAD 30%, mid LAD 20%, ostial circumflex 99%, then 80%, mid RCA 99%, EF 30-35%, 3-4+ MR; proximal right iliac occluded, right renal 50%, ostial left iliac 70% >>> CABG/MV repair  . Cataract   . CHF (congestive heart failure) (HCC)    Dr. Dorris Fetch following  . Chronic systolic heart failure (HCC)   . Diabetes mellitus, type 2 (HCC)   . Hemorrhoids   . Hx of echocardiogram    Echo (9/15):  Mild LVH, EF 40-45%, inf AK, Gr 1 DD, trivial AI, MV repair ok (mild MS, mild MR), mild LAE, PASP 32 mmHg.  [name] Hyperlipidemia   . Hypertension   . Ischemic cardiomyopathy    a. Echo (11/08/13):  EF 30-35%, diffuse hypokinesis, severe MR, mild LAE, PASP 48 mm Hg  . Mitral regurgitation    a. s/p MV repair at time  of CABG  . Osteoporosis   . PVD (peripheral vascular disease) (HCC)   . Vitamin B12 deficiency     Past Surgical History:  Procedure Laterality Date  . ABDOMINAL HYSTERECTOMY     1969  . APPENDECTOMY     1945  . CORONARY ARTERY BYPASS GRAFT  1990, 1995, 1998  . HEMORRHOID SURGERY     1959  . IR ANGIO INTRA EXTRACRAN SEL COM CAROTID INNOMINATE BILAT MOD SED  09/01/2018  . IR ANGIO VERTEBRAL SEL SUBCLAVIAN INNOMINATE UNI R MOD SED  09/01/2018  . IR ANGIO VERTEBRAL SEL VERTEBRAL UNI L MOD SED  09/01/2018  . IR PATIENT EVAL TECH 0-60 MINS  08/31/2018  . LEFT AND RIGHT HEART CATHETERIZATION WITH CORONARY ANGIOGRAM N/A 11/10/2013   Procedure: LEFT AND RIGHT HEART CATHETERIZATION WITH CORONARY ANGIOGRAM;  Surgeon: Lennette Bihari, MD;  Location: [facility] CATH LAB;  Service: Cardiovascular;  Laterality: N/A;  . MITRAL VALVE REPLACEMENT (MVR)/CORONARY ARTERY BYPASS GRAFTING (CABG) N/A 11/11/2013   Procedure: MITRAL VALVE Ring repair (MVR)/CORONARY ARTERY BYPASS GRAFTING (CABG) times three using left internal mammary and left sphenous vein.;  Surgeon: Loreli Slot, MD;  Location: [facility] OR;  Service: Open Heart Surgery;  Laterality: N/A;  Coronary artery bypass graft times three using left internal mammary artery and left saphenous leg  vein using endoscope.  Exploration of right leg.     Allergies  Allergen Reactions  . Meperidine Hcl Nausea And Vomiting  . Sulfonamide Derivatives Nausea And Vomiting  . Demerol [Meperidine]     Unknown per pt     Prior to Admission medications   Medication Sig Start Date End Date Taking? Authorizing Provider  Calcium Carbonate-Vitamin D (CALCIUM-VITAMIN D) 600-200 MG-UNIT CAPS Take 1 capsule by mouth 2 (two) times daily.    Yes [provider]  Casanthranol-Docusate Sodium 30-100 MG CAPS Take 1 capsule by mouth daily as needed (for constipation).    Yes [provider]  ferrous sulfate 325 (65 FE) MG tablet Take 325 mg by mouth 2 (two) times daily  with a meal.    Yes [provider]  lisinopril (PRINIVIL,ZESTRIL) 40 MG tablet TAKE 1 TABLET BY MOUTH ONCE DAILY Patient taking differently: Take 40 mg by mouth daily.  08/25/17  Yes Iran Ouch, MD  Multiple Vitamin (MULTIVITAMIN) tablet Take 1 tablet by mouth daily.     Yes [provider]  pravastatin (PRAVACHOL) 40 MG tablet Take 1 tablet (40 mg total) by mouth daily. 04/15/18  Yes Seabron Spates R, DO  sitaGLIPtin (JANUVIA) 100 MG tablet Take 1 tablet (100 mg total) by mouth daily. 04/28/18  Yes Seabron Spates R, DO  Tetrahydrozoline HCl (VISINE OP) Apply 1 drop to eye daily as needed (dry eyes).   Yes [provider]  vitamin B-12 (CYANOCOBALAMIN) 1000 MCG tablet Take 1,000 mcg by mouth daily.     Yes [provider]  acetaminophen (TYLENOL) 325 MG tablet Take 2 tablets (650 mg total) by mouth every 6 (six) hours as needed for mild pain or headache (or Fever >/= 101). 08/30/18   Emokpae, Courage, MD  aspirin (ECOTRIN LOW STRENGTH) 81 MG EC tablet Take 1 tablet (81 mg total) by mouth daily with breakfast. 08/30/18   Mariea Clonts, Courage, MD  metoprolol tartrate (LOPRESSOR) 50 MG tablet Take 1 tablet (50 mg total) by mouth 2 (two) times daily. 08/30/18   Shon Hale, MD    Social History   Socioeconomic History  . Marital status: Widowed    Spouse name: Not on file  . Number of children: Not on file  . Years of education: Not on file  . Highest education level: Not on file  Occupational History  . Occupation: retired    Associate Professor: RETIRED  Social Needs  . Financial resource strain: Not on file  . Food insecurity:    Worry: Not on file    Inability: Not on file  . Transportation needs:    Medical: Not on file    Non-medical: Not on file  Tobacco Use  . Smoking status: Former Smoker    Packs/day: 1.00    Years: 36.00    Pack years: 36.00    Types: Cigarettes    Last attempt to quit: 02/18/1984    Years since quitting: 34.5  .  Smokeless tobacco: Never Used  Substance and Sexual Activity  . Alcohol use: No  . Drug use: No  . Sexual activity: Not Currently  Lifestyle  . Physical activity:    Days per week: Not on file    Minutes per session: Not on file  . Stress: Not on file  Relationships  . Social connections:    Talks on phone: Not on file    Gets together: Not on file    Attends religious service: Not on file  Active member of club or organization: Not on file    Attends meetings of clubs or organizations: Not on file    Relationship status: Not on file  . Intimate partner violence:    Fear of current or ex partner: Not on file    Emotionally abused: Not on file    Physically abused: Not on file    Forced sexual activity: Not on file  Other Topics Concern  . Not on file  Social History Narrative   Exercise-- yard, house work     Family History  Problem Relation Age of Onset  . Cervical cancer Mother   . Cancer Brother        renal ...x3??  [name] Hypertension Other   . Stroke Other   . Hypertension Sister   . Heart attack Neg Hx     ROS: Otherwise negative unless mentioned in HPI  Physical Examination  Vitals:   09/06/18 0809 09/06/18 0816  BP: (!) 99/47 108/62  Pulse: 71 70  Resp: (!) 22 (!) 22  Temp:  97.6 F (36.4 C)  SpO2: 100% 100%   Body mass index is 24.46 kg/m.  General: Pale, nonverbal Gait: Not observed HENT: WNL, normocephalic Pulmonary: normal non-labored breathing Abdomen:  soft, NT/ND, no masses Skin: without rashes Vascular Exam/Pulses: Feet symmetrically warm to touch Extremities: Restraint on right hand; 2 out of 5 grip strength left hand Musculoskeletal: no muscle wasting or atrophy  Neurologic: Nonverbal, not following commands Lymph:  Unremarkable  CBC    Component Value Date/Time   WBC 21.3 (H) 09/06/2018 0512   RBC 1.82 (L) 09/06/2018 0512   HGB 6.1 (LL) 09/06/2018 0512   HCT 18.3 (L) 09/06/2018 0512   PLT 239 09/06/2018 0512   MCV 100.5 (H)  09/06/2018 0512   MCH 33.5 09/06/2018 0512   MCHC 33.3 09/06/2018 0512   RDW 15.3 09/06/2018 0512   LYMPHSABS 2.2 09/03/2018 0453   MONOABS 1.1 (H) 09/03/2018 0453   EOSABS 0.2 09/03/2018 0453   BASOSABS 0.0 09/03/2018 0453    BMET    Component Value Date/Time   NA 140 09/06/2018 0512   K 4.7 09/06/2018 0512   CL 110 09/06/2018 0512   CO2 18 (L) 09/06/2018 0512   GLUCOSE 207 (H) 09/06/2018 0512   BUN 33 (H) 09/06/2018 0512   CREATININE 1.79 (H) 09/06/2018 0512   CALCIUM 8.6 (L) 09/06/2018 0512   GFRNONAA 25 (L) 09/06/2018 0512   GFRAA 29 (L) 09/06/2018 0512    COAGS: Lab Results  Component Value Date   INR 1.3 (H) 08/31/2018   INR 1.2 08/25/2018   INR 2.5 05/02/2014     Non-Invasive Vascular Imaging:    MR brain IMPRESSION: 1. Moderate area of acute infarction along the left more than right high cerebral convexity, likely watershed. Mild petechial hemorrhage on the left 2. Background of extensive chronic small vessel ischemia in the cerebral white matter. 3. Scattered remote micro hemorrhages suspicious for amyloid Angiopathy.  CTA head neck IMPRESSION: 1. No emergent large vessel occlusion. 2. Severe, pre occlusive atheromatous stenosis at the left ICA origin. This is presumably the source of left MCA territory infarcts on prior head CT. 3. Acute subsegmental pulmonary embolism in the right upper lobe.    ASSESSMENT/PLAN: This is a 85 y.o. female with high-grade stenosis of left ICA, left CVA; patient also noted to have acute pulmonary embolism; currently being transfused due to hemoglobin of 6.1  - CTA demonstrating 90% lesion at the origin of  left ICA likely etiology of CVA - Per nursing staff clinically patient is worsening and is now nonverbal and not following commands - Patient would be potentially a candidate for TCAR however will have to improve dramatically clinically and will have to also recover from anemia as well as acute PE. - On-call  vascular surgeon Dr. Darrick PennaFields will evaluate the patient later today and provide further treatment plan   Emilie RutterMatthew Eveland PA-C [facility] [phone]  History and exam details as above. I am unsure how the diagnosis of PE was made ?clinical [facility] that I could find. She has a high lesion in the left ICA that would be difficult to access from standard CEA.  She would be very high risk for cranial nerve injury.  Best treatment would be TCAR left carotid but would need stable hemoglobin and able to tolerate dual antiplatelet therapy.  She has had deterioration today.  Will recheck in a few days to see if she is more stable.  CT abdomen today shows larger right retroperitoneal hematoma most likely source of anemia.  Would transfuse to hemoglobin of 10 and stop all anticoagulation.  Fabienne Brunsharles Fields, MD [facility] [place] Office: [phone] Pager: [phone]

## 2018-09-06 NOTE — Progress Notes (Signed)
Received critical lab value on hemoglobin of 6.1; paged on call doctor

## 2018-09-06 NOTE — Progress Notes (Addendum)
PROGRESS NOTE    Emily Daniel  NGE:[id number]  DOB: [birth year]  DOA: 08/29/2018 PCP: Donato SchultzLowne Chase, Yvonne R, DO  Brief Narrative: 85 year old female with history of hypertension, hyperlipidemia, diabetes, CAD status post CABG and mitral valve repair for MR, chronic systolic CHF, peripheral vascular disease,Atrial fibrillation not on anticoagulation at baseline due to GI bleed/AVMs, old frontal infarctions on MRI presented to ED on April 4 with confusion, initially attributed to UTI/left lower extremity cellulitis.  On April 6 patient had chest pain with EKG showing ST changes followed by emesis/global aphasia/right facial and right-sided hemiparesis.  CT head showed acute on chronic frontal infarcts, MRI of the head showed moderate left greater than right cerebral convexity watershed infarcts with petechial hemorrhages on the left.  She was seen by neurology and received TPA on April 6 at 7:56 AM.  CTA of the head and neck showed preocclusive stenosis of left internal carotid artery as well as right upper lobe PE.  Cerebral angiogram showed left ICA proximal 90% stenosis.  EEG showed generalized background slowing.  Patient now on IV heparin for PE/ CVA with high-grade left carotid stenosis.  Per neurology, patient did not tolerate aspirin/Brilinta in addition to heparin with drop in hemoglobin.  Hence, recommend vascular surgery evaluation for CEA rather than stenting.  Patient finished antibiotic course with amoxicillin/doxycycline for enterococcal UTI/cellulitis.  She has been seen by speech therapy and currently on regular diet.  She is being evaluated by PT/acute rehab  Subjective: Patient awake but less responsive to questions and communicating only by nodding her head. She nods when asked about pain or headache but doesn't elaborate. She ate breakfast yesterday well but been c/o no appetite and eating poorly since. Per bedside nurse , no signs of dysphagia/choking. AM labs showed drop in Hgb to  6.1 and 1 unit PRBC initiated. CBC also revealed leucocytosis , no fever. Her systolic BP has been low 80 to 100s overnight.   Objective: Vitals:   09/06/18 0740 09/06/18 0809 09/06/18 0816 09/06/18 0845  BP: (!) 121/57 (!) 99/47 108/62 125/65  Pulse: 78 71 70 71  Resp: 16 (!) 22 (!) 22 (!) 22  Temp: 97.7 F (36.5 C)  97.6 F (36.4 C) (!) 97.5 F (36.4 C)  TempSrc: Axillary  Axillary Axillary  SpO2: 100% 100% 100% 100%  Weight:      Height:        Intake/Output Summary (Last 24 hours) at 09/06/2018 0959 Last data filed at 09/05/2018 2009 Gross per 24 hour  Intake 769.25 ml  Output 500 ml  Net 269.25 ml   Filed Weights   08/29/18 1338 08/29/18 1856  Weight: 57.6 kg 56.8 kg    Physical Examination:  General exam: Appears calm and comfortable  Respiratory system: Clear to auscultation. Respiratory effort normal. Cardiovascular system: S1 & S2 heard, RRR. No JVD, + grade 1 systolic murmur at apex, no rubs, gallops. No pedal edema. Gastrointestinal system: Abdomen is nondistended, soft and ?LLQ tender. No organomegaly or masses felt. Normal bowel sounds heard. Central nervous system: Awake, could not test orientation. ?  Mild right facial droop.  Right hemiparesis (right lower extremity 3/5 and right upper extremity flaccid), strength 4/5 on the left side.  Extremities: No edema Skin: ecchymosis on UE, moderate size bruise on RUE.   Psychiatry: could not assess   Data Reviewed: I have personally reviewed following labs and imaging studies  CBC: Recent Labs  Lab 09/03/18 0453 09/03/18 0739 09/03/18 1615 09/04/18 0333 09/04/18 1842  09/05/18 0452 09/06/18 0512  WBC 10.6* 10.0  --  9.7 12.0* 11.2* 21.3*  NEUTROABS 7.0  --   --   --   --   --   --   HGB 7.5* 7.1* 9.2* 7.8* 8.7* 8.8* 6.1*  HCT 23.2* 22.2* 27.7* 24.2* 26.8* 27.0* 18.3*  MCV 102.2* 102.3*  --  98.8 97.5 97.5 100.5*  PLT 244 230  --  203 237 227 239   Basic Metabolic Panel: Recent Labs  Lab 09/02/18  0240 09/03/18 0453 09/04/18 0333 09/05/18 0452 09/06/18 0512  NA 138 140 141 142 140  K 3.9 3.3* 3.4* 4.1 4.7  CL 109 111 115* 112* 110  CO2 20* 20* 18* 20* 18*  GLUCOSE 150* 129* 130* 128* 207*  BUN 16 32* 29* 22 33*  CREATININE 0.98 0.93 0.99 0.97 1.79*  CALCIUM 9.0 8.7* 8.3* 8.9 8.6*   GFR: Estimated Creatinine Clearance: 17.8 mL/min (A) (by C-G formula based on SCr of 1.79 mg/dL (H)). Liver Function Tests: Recent Labs  Lab 08/31/18 1125  AST 27  ALT 26  ALKPHOS 43  BILITOT 0.7  PROT 6.0*  ALBUMIN 3.1*   No results for input(s): LIPASE, AMYLASE in the last 168 hours. No results for input(s): AMMONIA in the last 168 hours. Coagulation Profile: Recent Labs  Lab 08/31/18 1125  INR 1.3*   Cardiac Enzymes: Recent Labs  Lab 08/31/18 0639 08/31/18 1125 08/31/18 1804  TROPONINI <0.03 <0.03 <0.03   BNP (last 3 results) No results for input(s): PROBNP in the last 8760 hours. HbA1C: No results for input(s): HGBA1C in the last 72 hours. CBG: Recent Labs  Lab 09/05/18 1140 09/05/18 1637 09/05/18 2121 09/06/18 0602 09/06/18 0759  GLUCAP 181* 180* 216* 183* 185*   Lipid Profile: No results for input(s): CHOL, HDL, LDLCALC, TRIG, CHOLHDL, LDLDIRECT in the last 72 hours. Thyroid Function Tests: No results for input(s): TSH, T4TOTAL, FREET4, T3FREE, THYROIDAB in the last 72 hours. Anemia Panel: Recent Labs    09/04/18 0333  VITAMINB12 2,821*  FOLATE 33.5  FERRITIN 82  TIBC 204*  IRON 79   Sepsis Labs: No results for input(s): PROCALCITON, LATICACIDVEN in the last 168 hours.  Recent Results (from the past 240 hour(s))  Urine culture     Status: Abnormal   Collection Time: 08/29/18  3:30 PM  Result Value Ref Range Status   Specimen Description URINE, RANDOM  Final   Special Requests   Final    NONE Performed at [facility], [address], [place], [place] [address]    Culture 20,000 COLONIES/mL ENTEROCOCCUS FAECALIS (A)  Final   Report  Status 08/31/2018 FINAL  Final   Organism ID, Bacteria ENTEROCOCCUS FAECALIS (A)  Final      Susceptibility   Enterococcus faecalis - MIC*    AMPICILLIN <=2 SENSITIVE Sensitive     LEVOFLOXACIN >=8 RESISTANT Resistant     NITROFURANTOIN <=16 SENSITIVE Sensitive     VANCOMYCIN 1 SENSITIVE Sensitive     * 20,000 COLONIES/mL ENTEROCOCCUS FAECALIS  MRSA PCR Screening     Status: None   Collection Time: 08/31/18  8:59 AM  Result Value Ref Range Status   MRSA by PCR NEGATIVE NEGATIVE Final    Comment:        The GeneXpert MRSA Assay (FDA approved for NASAL specimens only), is one component of a comprehensive MRSA colonization surveillance program. It is not intended to diagnose MRSA infection nor to guide or monitor treatment for MRSA infections.  Performed at [facility], [address] [place], [place] [address]       Radiology Studies: Ct Head Wo Contrast  Result Date: 09/06/2018 CLINICAL DATA:  Encephalopathy.  Stroke. EXAM: CT HEAD WITHOUT CONTRAST TECHNIQUE: Contiguous axial images were obtained from the base of the skull through the vertex without intravenous contrast. COMPARISON:  CT head 08/31/2018 FINDINGS: Brain: Cortical hypodensity in the left frontal convexity unchanged in extent from prior CT compatible with acute/subacute infarction. No associated hemorrhage Moderate atrophy. Extensive chronic microvascular ischemic change in the white matter. Small chronic infarcts in the cerebellum bilaterally. Vascular: Negative for hyperdense vessel Skull: Negative Sinuses/Orbits: Negative Other: None IMPRESSION: Cortical acute/subacute infarct in the high left frontal lobe is unchanged from the CT of 08/31/2018. Negative for hemorrhage. No new findings. Electronically Signed   By: Marlan Palau M.D.   On: 09/06/2018 09:51        Scheduled Meds: . sodium chloride   Intravenous Once  . calcium-vitamin D   Oral BID  . ferrous sulfate  325 mg Oral BID WC  . insulin aspart   0-6 Units Subcutaneous Q6H  . multivitamin with minerals  1 tablet Oral Daily  . pravastatin  40 mg Oral Daily  . sodium chloride flush  3 mL Intravenous Q12H  . tetrahydrozoline  1 drop Both Eyes BID   Continuous Infusions: . sodium chloride 46 mL/hr at 09/02/18 0000  . sodium chloride 25 mL/hr at 09/04/18 1600  . clevidipine Stopped (09/03/18 0542)    Assessment & Plan:    1.  Left MCA stroke secondary to severe, pre occlusive atheromatous stenosis at the left ICA origin: Patient developed severe anemia with dual antiplatelet agents (aspirin and Brilinta) in addition to heparin in prior hospital course , required 2 units PRBC on 4/9.  She does have high CHADSVasc score with underlying paroxysmal A. fib history. Been on IV heparin and statins per neurology recommendations.  Echo showed EF of 60% with no evidence of PFO or intracardiac thrombi.  Consulted vascular surgery for Carotid stenosis and they have seen patient today. Holding heparin today given hypotension/drop in hemoglobin and transfusing PRBC. Held antihypertensives in view on hypotension.Prefer permissive hypertension (with goal around systolic 140 in the short-term) given high-grade carotid stenosis. Repeat CT head given change in mental status. Ongoing PT/OT/rehab evaluation.  Speech cleared for regular consistency diet.  2. Acute subsegmental pulmonary embolism in the right upper lobe: Likely the cause for chest pain. Been on IV heparin but held this morning in concern for bleeding/drop in hgb.  Will need transition to Coumadin once surgical interventions completed. Doppler lower extremities negative for DVT.  No evidence of PFO on echo.  3.  Metabolic encephalopathy: Secondary to problem #1 likely. Repeat CT head today. EEG showed generalized background slowing with no seizure activity.    4. Acute worsening of anemia: Hemoglobin dropped again to 6 today and requiring PRBC transfusion.(recieved 2 units PRBC on 4/9) . Check post  transfusion hgb and transfuse as needed. MCV 100. Stool for occult blood negative 4/10.  Will obtain CT head and CT abd/pelvis to r/o intracranial/retroperitoneal bleed. Has some bruising on extremities but doesn't explain the big drop in hgb.   Iron studies show ferritin 82, TIBC low at 204, normal iron at 79 and normal percent sat.  B12 and folate levels within normal limits.LDH slightly elevated and transferrin low. Check peripheral smear for schistocytes. Bilirubin normal.   5. AKI: Creatinine nl on labs yesterday, 1.7  today. Likely related to poor oral intake and hypotension. Will start IV fluids after blood transfusion.   6.  Peripheral vascular disease/carotid artery stenosis: Vascular surgery evaluation as discussed above.  Unable to tolerate antiplatelet agents.  Held anticoagulation in concern for anemia today.  7.  Valvular heart disease: Patient status post mitral annuloplasty.  Echocardiogram did show moderate to severely elevated right ventricular systolic pressure, severely elevated mitral diastolic gradient, mild to moderate tricuspid regurgitation and moderate calcification of aortic valve.  8.  UTI/cellulitis: Completed antibiotic therapy and leukocytosis resolved but has been uptrending again in last 48 hours. WBC upto 21k today. Afebrile. Nl U/A & Chest x-ray on 4/8. ?reactive. Will f/u CT abd results (without contrast due to AKI)  9. Hypertension: Hypotensive today. Hold metoprolol and lisinopril.  Hydralazine PRN  10.  Diabetes mellitus: Hemoglobin A1c 5.9.  Continue sliding scale insulin until oral intake consistent.  Currently blood glucose 1 30-1 80.  DVT prophylaxis: On IV heparin Code Status: Full code Family / Patient Communication: Will call family (son) and request palliative care consult for this unfortunate elderly female with multiple ongoing critical issues and grave prognosis Disposition Plan: TBD   ADDENDUM 11am: D/W patient's son Mr Kemiyah Tarazon and  explained poor prognosis, fatal risk of recurrent stroke, worsening PE without anticoagulation and worsening anemia with anticoagulation. Her poor oral intake, hypotension, AKI are concerning as well. Repeat CT head negative, awaiting CT abd/pelvis results. Per son, patient has an advance directive (he will try to fax a copy) and he is the HCPOA. She has expressed her wishes not to be kept alive on artificial feeding or ventilator. She apparently mentioned to him 2 days back that she would rather die than live with stroke. Molly Maduro is comfortable changing her code status to DNR/DNI. He is also contemplating comfort care if she gets worse or doesn't improve but would like to talk to his brother Marcy Salvo first. I have entered DNR order and started patient on IV fluids for dehydration/AKI. Speech to follow up regarding diet safety. NPO for now.  No feeding tubes per family.    Addendum 12.15pm: Notified by radiologist that CT abd/pelvis does in fact reveal large retroperitoneal hematoma which explains her anemia. Palliative care NP discussed this finding with family and they have chosen comfort measures only. D/C heparin. Pallitaive care team will take care of other comfort care orders.    LOS: 6 days    Time spent: 40 minutes    Alessandra Bevels, MD [facility] Pager [phone]  If 7PM-7AM, please contact night-coverage [email or web address] Password [facility] 09/06/2018, 9:59 AM

## 2018-09-07 DIAGNOSIS — D72829 Elevated white blood cell count, unspecified: Secondary | ICD-10-CM

## 2018-09-07 DIAGNOSIS — E785 Hyperlipidemia, unspecified: Secondary | ICD-10-CM

## 2018-09-07 DIAGNOSIS — N3000 Acute cystitis without hematuria: Secondary | ICD-10-CM

## 2018-09-07 DIAGNOSIS — I771 Stricture of artery: Secondary | ICD-10-CM

## 2018-09-07 LAB — TYPE AND SCREEN
ABO/RH(D): O POS
Antibody Screen: NEGATIVE
Unit division: 0
Unit division: 0

## 2018-09-07 LAB — BPAM RBC
Blood Product Expiration Date: 202004132359
Blood Product Expiration Date: 202004282359
ISSUE DATE / TIME: 202004091205
ISSUE DATE / TIME: 202004120807
Unit Type and Rh: 5100
Unit Type and Rh: 9500

## 2018-09-07 MED ORDER — MORPHINE SULFATE (CONCENTRATE) 10 MG/0.5ML PO SOLN
5.0000 mg | Freq: Four times a day (QID) | ORAL | Status: DC
  Administered 2018-09-07 – 2018-09-09 (×4): 5 mg via ORAL
  Filled 2018-09-07 (×4): qty 0.5

## 2018-09-07 NOTE — Progress Notes (Signed)
   09/07/18 1039  Clinical Encounter Type  Visited With Patient  Visit Type Initial;Spiritual support  Referral From Palliative care team  Consult/Referral To Chaplain  The chaplain responded to PMT consult for Pt. spiritual care.  The chaplain reviewed the chart and checked in with the Pt. RN-Heather before entering the Pt. Room.  This chaplain found the Pt. sleeping. The Pt. did not respond to the call of her name at the time of the chaplain's visit.  The chaplain is available for F/U spiritual care as needed with the Pt. and family.

## 2018-09-07 NOTE — Progress Notes (Addendum)
PROGRESS NOTE    GENEEN LEDESMA  EFE:[id number]  DOB: [birth year]  DOA: 08/29/2018 PCP: Donato Schultz, DO  Brief Narrative: 85 year old female with history of hypertension, hyperlipidemia, diabetes, CAD status post CABG and mitral valve repair for MR, chronic systolic CHF, peripheral vascular disease,Atrial fibrillation not on anticoagulation at baseline due to GI bleed/AVMs, old frontal infarctions on MRI presented to ED on April 4 with confusion, initially attributed to UTI/left lower extremity cellulitis.  On April 6 patient had chest pain with EKG showing ST changes followed by emesis/global aphasia/right facial and right-sided hemiparesis.  CT head showed acute on chronic frontal infarcts, MRI of the head showed moderate left greater than right cerebral convexity watershed infarcts with petechial hemorrhages on the left.  She was seen by neurology and received TPA on April 6 at 7:56 AM.  CTA of the head and neck showed preocclusive stenosis of left internal carotid artery as well as right upper lobe PE.  Cerebral angiogram showed left ICA proximal 90% stenosis.  EEG showed generalized background slowing.  Patient now on IV heparin for PE/ CVA with high-grade left carotid stenosis.  Per neurology, patient did not tolerate aspirin/Brilinta in addition to heparin with drop in hemoglobin.  Hence, recommend vascular surgery evaluation for CEA rather than stenting.  Patient finished antibiotic course with amoxicillin/doxycycline for enterococcal UTI/cellulitis.  She has been seen by speech therapy and currently on regular diet.  She is being evaluated by PT/acute rehab  Subjective: Patient sleeping comfortably , in no distress. Easily arousable. Doesn't answer any questions  Objective: Vitals:   09/06/18 2020 09/07/18 0012 09/07/18 0428 09/07/18 0823  BP: (!) 119/39 (!) 139/41 (!) 140/49 (!) 128/55  Pulse: 95 92 96 99  Resp: 16 16 14 16   Temp: 97.8 F (36.6 C) 98.4 F (36.9 C) 99 F (37.2  C) 99.6 F (37.6 C)  TempSrc: Oral Oral Oral Oral  SpO2: 97% 97% 98% 94%  Weight:      Height:        Intake/Output Summary (Last 24 hours) at 09/07/2018 1409 Last data filed at 09/07/2018 0900 Gross per 24 hour  Intake 220.83 ml  Output -  Net 220.83 ml   Filed Weights   08/29/18 1338 08/29/18 1856  Weight: 57.6 kg 56.8 kg    Physical Examination:  General exam: Appears calm and comfortable  Respiratory system: Clear to auscultation. Respiratory effort normal. Cardiovascular system: S1 & S2 heard, RRR. No JVD, + grade 1 systolic murmur at apex, no rubs, gallops. No pedal edema. Gastrointestinal system: Abdomen is nondistended, soft and some LLQ tenderness. No organomegaly or masses felt. Normal bowel sounds heard. Central nervous system: Sleeping but easily arousable, could not test orientation. Right hemiparesis (right lower extremity 3/5 and right upper extremity flaccid), strength 4/5 on the left side.  Extremities: No edema Skin: ecchymosis on UE, moderate size bruise on RUE.   Psychiatry: could not assess   Data Reviewed: I have personally reviewed following labs and imaging studies  CBC: Recent Labs  Lab 09/03/18 0453 09/03/18 0739 09/03/18 1615 09/04/18 0333 09/04/18 1842 09/05/18 0452 09/06/18 0512  WBC 10.6* 10.0  --  9.7 12.0* 11.2* 21.3*  NEUTROABS 7.0  --   --   --   --   --   --   HGB 7.5* 7.1* 9.2* 7.8* 8.7* 8.8* 6.1*  HCT 23.2* 22.2* 27.7* 24.2* 26.8* 27.0* 18.3*  MCV 102.2* 102.3*  --  98.8 97.5 97.5 100.5*  PLT 244  230  --  203 237 227 239   Basic Metabolic Panel: Recent Labs  Lab 09/02/18 0240 09/03/18 0453 09/04/18 0333 09/05/18 0452 09/06/18 0512  NA 138 140 141 142 140  K 3.9 3.3* 3.4* 4.1 4.7  CL 109 111 115* 112* 110  CO2 20* 20* 18* 20* 18*  GLUCOSE 150* 129* 130* 128* 207*  BUN 16 32* 29* 22 33*  CREATININE 0.98 0.93 0.99 0.97 1.79*  CALCIUM 9.0 8.7* 8.3* 8.9 8.6*   GFR: Estimated Creatinine Clearance: 17.8 mL/min (A) (by  C-G formula based on SCr of 1.79 mg/dL (H)). Liver Function Tests: No results for input(s): AST, ALT, ALKPHOS, BILITOT, PROT, ALBUMIN in the last 168 hours. No results for input(s): LIPASE, AMYLASE in the last 168 hours. No results for input(s): AMMONIA in the last 168 hours. Coagulation Profile: No results for input(s): INR, PROTIME in the last 168 hours. Cardiac Enzymes: Recent Labs  Lab 08/31/18 1804  TROPONINI <0.03   BNP (last 3 results) No results for input(s): PROBNP in the last 8760 hours. HbA1C: No results for input(s): HGBA1C in the last 72 hours. CBG: Recent Labs  Lab 09/05/18 1637 09/05/18 2121 09/06/18 0602 09/06/18 0759 09/06/18 1143  GLUCAP 180* 216* 183* 185* 162*   Lipid Profile: No results for input(s): CHOL, HDL, LDLCALC, TRIG, CHOLHDL, LDLDIRECT in the last 72 hours. Thyroid Function Tests: No results for input(s): TSH, T4TOTAL, FREET4, T3FREE, THYROIDAB in the last 72 hours. Anemia Panel: No results for input(s): VITAMINB12, FOLATE, FERRITIN, TIBC, IRON, RETICCTPCT in the last 72 hours. Sepsis Labs: No results for input(s): PROCALCITON, LATICACIDVEN in the last 168 hours.  Recent Results (from the past 240 hour(s))  Urine culture     Status: Abnormal   Collection Time: 08/29/18  3:30 PM  Result Value Ref Range Status   Specimen Description URINE, RANDOM  Final   Special Requests   Final    NONE Performed at [facility], [address] [place], [place] [address]    Culture 20,000 COLONIES/mL ENTEROCOCCUS FAECALIS (A)  Final   Report Status 08/31/2018 FINAL  Final   Organism ID, Bacteria ENTEROCOCCUS FAECALIS (A)  Final      Susceptibility   Enterococcus faecalis - MIC*    AMPICILLIN <=2 SENSITIVE Sensitive     LEVOFLOXACIN >=8 RESISTANT Resistant     NITROFURANTOIN <=16 SENSITIVE Sensitive     VANCOMYCIN 1 SENSITIVE Sensitive     * 20,000 COLONIES/mL ENTEROCOCCUS FAECALIS  MRSA PCR Screening     Status: None   Collection Time:  08/31/18  8:59 AM  Result Value Ref Range Status   MRSA by PCR NEGATIVE NEGATIVE Final    Comment:        The GeneXpert MRSA Assay (FDA approved for NASAL specimens only), is one component of a comprehensive MRSA colonization surveillance program. It is not intended to diagnose MRSA infection nor to guide or monitor treatment for MRSA infections. Performed at [facility], [address] [place], [place] [address]       Radiology Studies: Ct Abdomen Pelvis Wo Contrast  Addendum Date: 09/06/2018   ADDENDUM REPORT: 09/06/2018 11:51 ADDENDUM: These results were called by telephone at the time of interpretation on 09/06/2018 at 11:51 am to Dr. Alessandra Bevels , who verbally acknowledged these results. Electronically Signed   By: Marlan Palau M.D.   On: 09/06/2018 11:51   Result Date: 09/06/2018 CLINICAL DATA:  Recurring anemia. EXAM: CT ABDOMEN AND PELVIS WITHOUT CONTRAST  TECHNIQUE: Multidetector CT imaging of the abdomen and pelvis was performed following the standard protocol without IV contrast. COMPARISON:  CT abdomen pelvis 03/29/2009 FINDINGS: Lower chest: Small pleural effusions bilaterally. Mild cardiac enlargement. Atherosclerotic calcification in the thoracic aorta. No infiltrate in the lung bases. Hepatobiliary: No focal liver abnormality is seen. No gallstones, gallbladder wall thickening, or biliary dilatation. Pancreas: Pancreatic atrophy.  Negative for mass or edema Spleen: Small spleen without focal abnormality Adrenals/Urinary Tract: Normal kidneys. No renal mass or obstruction. No renal calculi. Foley catheter in the bladder which is empty. Stomach/Bowel: Negative for bowel obstruction. No bowel mass or edema. Prior appendectomy. Vascular/Lymphatic: Atherosclerotic calcification aorta and iliac arteries without aneurysm. No lymphadenopathy Reproductive: Hysterectomy.  No pelvic mass. Other: Large retroperitoneal mass on the right compatible with hematoma which appears  chronic. This is diffusely low density with surrounding stranding in the soft tissues. This is extraperitoneal. The hematoma measures 11 x 6.5 x 17 cm. Hematoma extends into the right psoas muscle. Small amount of free fluid in the pelvis. Musculoskeletal: Moderate compression fracture T12 appears chronic. Mild compression fracture superior endplate of L3 appears chronic. No acute skeletal lesion. IMPRESSION: 1. Large retroperitoneal hematoma on the right extending into the pelvis and psoas muscle. This is likely the cause of anemia. The hematoma appears subacute to chronic. Note recent cerebral angiogram 09/01/2018 was via left femoral approach. 2. Small bilateral pleural effusions. 3. Atherosclerotic aorta without aneurysm. Electronically Signed: By: Marlan Palau M.D. On: 09/06/2018 11:29   Ct Head Wo Contrast  Result Date: 09/06/2018 CLINICAL DATA:  Encephalopathy.  Stroke. EXAM: CT HEAD WITHOUT CONTRAST TECHNIQUE: Contiguous axial images were obtained from the base of the skull through the vertex without intravenous contrast. COMPARISON:  CT head 08/31/2018 FINDINGS: Brain: Cortical hypodensity in the left frontal convexity unchanged in extent from prior CT compatible with acute/subacute infarction. No associated hemorrhage Moderate atrophy. Extensive chronic microvascular ischemic change in the white matter. Small chronic infarcts in the cerebellum bilaterally. Vascular: Negative for hyperdense vessel Skull: Negative Sinuses/Orbits: Negative Other: None IMPRESSION: Cortical acute/subacute infarct in the high left frontal lobe is unchanged from the CT of 08/31/2018. Negative for hemorrhage. No new findings. Electronically Signed   By: Marlan Palau M.D.   On: 09/06/2018 09:51        Scheduled Meds: . sodium chloride   Intravenous Once  . sodium chloride flush  3 mL Intravenous Q12H  . tetrahydrozoline  1 drop Both Eyes BID   Continuous Infusions: . sodium chloride 46 mL/hr at 09/02/18 0000   . lactated ringers 10 mL/hr at 09/06/18 1210    Assessment & Plan:    1.  Left MCA stroke secondary to severe, pre occlusive atheromatous stenosis at the left ICA origin: Patient developed severe anemia with dual antiplatelet agents (aspirin and Brilinta) in addition to heparin in prior hospital course , required 2 units PRBC on 4/9.  She does have high CHADSVasc score with underlying paroxysmal A. fib history. She came out of Neuro ICU  on IV heparin and statins. Echo showed EF of 60% with no evidence of PFO or intracardiac thrombi. Vascular surgery was consulted for Carotid stenosis but patient clinically declined on 4/12 with worsening anemia/hypotension and AMS. State CT head was -ve but CT/abd pelvis did reveal large retroperitoneal hematoma.  D/W patient's son Mr Yaneisy Wenz and explained poor prognosis, fatal risk of recurrent stroke, worsening PE without anticoagulation and worsening anemia/retoperitoneal hemorrhage with anticoagulation. Based on prior discussions with his mother,Mr Molly Maduro  Roh decided to make patient DNR and comfort measures were initiated by palliative care team.  At this point home hospice versus hospice facility choice is being explored.  She remains comfortable at this time.  No plan for further labs/radiological procedures over aggressive medical treatments.  2.Acute subsegmental pulmonary embolism in the right upper lobe:Doppler lower extremities negative for DVT.  No evidence of PFO on echo. Off heparin now.  Comfort measures initiated.   4.AKI:  Likely related to poor oral intake and hypotension.  No further lab work-up  6.  Diabetes mellitus: Off subcu injections and sliding scale insulin given comfort care now.    7.  Comfort care: Medications adjusted by palliative care team.  Pured diet for comfort as tolerated   Code Status: DNR/DNI/comfort care Family / Patient Communication: Discussed with family yesterday.  Palliative care team following with  ongoing discussions regarding hospice facility/home Disposition Plan: TBD     LOS: 7 days    Time spent: 25 minutes    Alessandra BevelsNeelima Kinnedy Mongiello, MD [facility] Pager [phone]  If 7PM-7AM, please contact night-coverage [email or web address] Password [email or web address] 09/07/2018, 2:09 PM

## 2018-09-07 NOTE — Progress Notes (Addendum)
Patient ID: Emily Daniel, female   DOB: [birth year], 85 y.o.   MRN: [id number]  This NP visited patient at the bedside as a follow up to  yesterday's GOCs meeting.  Patient remains weak, nonverbal, unable to follow commands and with little p.o. intake.      -add Roxanol 5 mg po/sl every 6 hrs  for underlying  generalized discomfort   Spoke  to son/Robert for continued conversation regarding current medical situation.  Family remains comfortable with their decision to focus on comfort and dignity at this known end-of-life period of time.  The two sons are discussing  the possibility of taking her home with hospice services.  They recognize that the majority of care will need to be delivered by family members or out-of-pocket caregivers and they are working on the logistics of that possibility.  They tell me they will give me a definitive decision later today reguarding transition of care.  Home with hospice versus residential hospice.  Discussed  the natural trajectory and expectations at end of life.  Questions and concerns addressed.    Total time spent on the unit was 35 minutes  Greater than 50% of the time was spent in counseling and coordination of care  Lorinda Creed NP  Palliative Medicine Team Team Phone # [phone] Pager [phone]

## 2018-09-07 NOTE — Progress Notes (Signed)
Inpatient Rehab Admissions Coordinator:   Note patient with worsening medical status over weekend.  Family pursuing hospice.  Will sign off at this time.    Estill Dooms, PT, DPT Admissions Coordinator [phone] 09/07/18  3:35 PM

## 2018-09-07 NOTE — Care Management Important Message (Signed)
Important Message  Patient Details  Name: Emily Daniel MRN: [id number] Date of Birth: 05-08-1930   Medicare Important Message Given:  Yes    [name] 09/07/2018, 4:38 PM

## 2018-09-08 ENCOUNTER — Telehealth: Payer: Self-pay | Admitting: Family Medicine

## 2018-09-08 DIAGNOSIS — R52 Pain, unspecified: Secondary | ICD-10-CM

## 2018-09-08 MED ORDER — LORAZEPAM 1 MG PO TABS: 1.0000 mg | ORAL_TABLET | ORAL | Status: DC | PRN

## 2018-09-08 MED ORDER — MORPHINE SULFATE (CONCENTRATE) 10 MG/0.5ML PO SOLN: 5.0000 mg | Freq: Four times a day (QID) | ORAL | 0 refills | Status: AC

## 2018-09-08 MED ORDER — LORAZEPAM 2 MG/ML PO CONC: 1.0000 mg | ORAL | 0 refills | Status: AC | PRN

## 2018-09-08 MED ORDER — MORPHINE SULFATE (CONCENTRATE) 10 MG/0.5ML PO SOLN: 5.0000 mg | ORAL | Status: DC | PRN

## 2018-09-08 NOTE — TOC Progression Note (Signed)
Transition of Care [facility]) - Progression Note    Patient Details  Name: LAVINIA KASAL MRN: [id number] Date of Birth: 04-13-1930  Transition of [facility] CM/SW Contact  Baldemar Len[place], [place] Phone Number: 09/08/2018, 10:41 AM  Clinical Narrative:   CSW alerted by NP with PMT that patient's sons have decided to take patient home with hospice and would prefer [facility]. CSW contacted Carley Hammed with [facility] to provide referral. CSW to follow.    Expected Discharge Plan: Home w Hospice Care Barriers to Discharge: Continued Medical Work up, English as a second language teacher  Expected Discharge Plan and Services Expected Discharge Plan: Home w Hospice Care     Post Acute Care Choice: Skilled Nursing Facility Living arrangements for the past 2 months: Single Family Home Expected Discharge Date: 08/30/18                   HH Arranged: PT HH Agency: [facility]   Social Determinants of Health (SDOH) Interventions    Readmission Risk Interventions No flowsheet data found.

## 2018-09-08 NOTE — Discharge Summary (Signed)
Physician Discharge Summary  Emily Daniel JYN:[id number] DOB: 1929-12-18 DOA: 08/29/2018  PCP: Emily Schultz, DO  Admit date: 08/29/2018 Discharge date: 09/08/2018  Time spent: 35 minutes  Recommendations for Outpatient Follow-up:  1. Home with Hospice for END of life care   Discharge Diagnoses:    Left MCA stroke   S/p TPA   Acute blood loss anemia   Acute PE   Retroperitoneal hemoorhage   DM (diabetes mellitus) type II uncontrolled, periph vascular disorder (HCC)   Hyperlipidemia   Anxiety state   Essential hypertension   Atrial fibrillation (HCC)   Acute lower UTI   Acute metabolic encephalopathy   Acute ischemic left MCA stroke (HCC)   Palliative care by specialist   DNR (do not resuscitate)   Retroperitoneal bleeding   Pain, generalized   Discharge Condition: poor  Diet recommendation: comfort feeds  Filed Weights   08/29/18 1338 08/29/18 1856  Weight: 57.6 kg 56.8 kg    History of present illness:  85 year old female with history of hypertension, hyperlipidemia, diabetes, CAD status post CABG and mitral valve repair for MR, chronic systolic CHF, peripheral vascular disease,Atrial fibrillation not on anticoagulation at baseline due to GI bleed/AVMs, old frontal infarctions on MRI presented to ED on April 4 with confusion, initially attributed to UTI/left lower extremity cellulitis  Hospital Course:   1.  Left MCA stroke secondary to severe, pre occlusive atheromatous stenosis at the left ICA origin:  - CT head showed acute on chronic frontal infarcts, MRI of the head showed moderate left greater than right cerebral convexity watershed infarcts with petechial hemorrhages on the left.  She was seen by neurology and received TPA on April 6 -CTA of the head and neck showed preocclusive stenosis of left internal carotid artery as well as right upper lobe Pulmonary embolism.  Cerebral angiogram showed left ICA proximal 90% stenosis.  EEG showed generalized  background slowing.  Patient then started on IV heparin for PE/ CVA with high-grade left carotid stenosis -Patient developed severe anemia with dual antiplatelet agents (aspirin and Brilinta) in addition to heparin in prior hospital course , required 2 units PRBC on 4/9. [name] Echo showed EF of 60% with no evidence of PFO or intracardiac thrombi. Vascular surgery was consulted for Carotid stenosis but patient clinically declined on 4/12 with worsening anemia/hypotension and AMS. State CT head was -ve but CT/abd pelvis did reveal large retroperitoneal hematoma.  D/W patient's son Mr Emily Daniel and explained poor prognosis, fatal risk of recurrent stroke, worsening PE without anticoagulation and worsening anemia/retoperitoneal hemorrhage with anticoagulation. Based on prior discussions with his mother,Mr Emily Daniel decided to make patient DNR and comfort measures were initiated by palliative care team.  At this point home hospice is being pursued. -appears relatively comfortable at this time  2.Acute subsegmental pulmonary embolism in the right upper lobe:Doppler lower extremities negative for DVT.  No evidence of PFO on echo. Off heparin now.  Comfort measures initiated.   3 .AKI:  Likely related to poor oral intake and hypotension.  No further lab work-up  4.  Diabetes mellitus: Off subcu injections and sliding scale insulin given comfort care now.    5. CAD/CABG  6.  Comfort care: Medications adjusted by palliative care team.  Pured diet for comfort as tolerated  Discharge Exam: Vitals:   09/08/18 0917 09/08/18 1230  BP: (!) 152/54 (!) 144/48  Pulse: (!) 101 100  Resp: (!) 24 (!) 28  Temp: 99.4 F (37.4 C) 98.9 F (  37.2 C)  SpO2: 95% 93%    General: awake, poorly responsive Cardiovascular: tachycardic Respiratory: distant breath sounds  Discharge Instructions   Discharge Instructions    Increase activity slowly   Complete by:  As directed      Allergies as of  09/08/2018      Reactions   Meperidine Hcl Nausea And Vomiting   Sulfonamide Derivatives Nausea And Vomiting   Demerol [meperidine]    Unknown per pt      Medication List    STOP taking these medications   Calcium-Vitamin D 600-200 MG-UNIT Caps   Casanthranol-Docusate Sodium 30-100 MG Caps   doxycycline 100 MG tablet Commonly known as:  VIBRA-TABS   ECOTRIN LOW STRENGTH PO   ferrous sulfate 325 (65 FE) MG tablet   lisinopril 40 MG tablet Commonly known as:  PRINIVIL,ZESTRIL   metoprolol tartrate 50 MG tablet Commonly known as:  LOPRESSOR   multivitamin tablet   pravastatin 40 MG tablet Commonly known as:  PRAVACHOL   sitaGLIPtin 100 MG tablet Commonly known as:  Januvia   VISINE OP   vitamin B-12 1000 MCG tablet Commonly known as:  CYANOCOBALAMIN     TAKE these medications   acetaminophen 325 MG tablet Commonly known as:  TYLENOL Take 2 tablets (650 mg total) by mouth every 6 (six) hours as needed for mild pain or headache (or Fever >/= 101).   LORazepam 2 MG/ML concentrated solution Commonly known as:  ATIVAN Place 0.5 mLs (1 mg total) under the tongue every 4 (four) hours as needed for anxiety.   morphine CONCENTRATE 10 MG/0.5ML Soln concentrated solution Take 0.25 mLs (5 mg total) by mouth every 6 (six) hours.      Allergies  Allergen Reactions  . Meperidine Hcl Nausea And Vomiting  . Sulfonamide Derivatives Nausea And Vomiting  . Demerol [Meperidine]     Unknown per pt       The results of significant diagnostics from this hospitalization (including imaging, microbiology, ancillary and laboratory) are listed below for reference.    Significant Diagnostic Studies: Ct Abdomen Pelvis Wo Contrast  Addendum Date: 09/06/2018   ADDENDUM REPORT: 09/06/2018 11:51 ADDENDUM: These results were called by telephone at the time of interpretation on 09/06/2018 at 11:51 am to Dr. Alessandra Daniel , who verbally acknowledged these results. Electronically  Signed   By: Marlan Palau M.D.   On: 09/06/2018 11:51   Result Date: 09/06/2018 CLINICAL DATA:  Recurring anemia. EXAM: CT ABDOMEN AND PELVIS WITHOUT CONTRAST TECHNIQUE: Multidetector CT imaging of the abdomen and pelvis was performed following the standard protocol without IV contrast. COMPARISON:  CT abdomen pelvis 03/29/2009 FINDINGS: Lower chest: Small pleural effusions bilaterally. Mild cardiac enlargement. Atherosclerotic calcification in the thoracic aorta. No infiltrate in the lung bases. Hepatobiliary: No focal liver abnormality is seen. No gallstones, gallbladder wall thickening, or biliary dilatation. Pancreas: Pancreatic atrophy.  Negative for mass or edema Spleen: Small spleen without focal abnormality Adrenals/Urinary Tract: Normal kidneys. No renal mass or obstruction. No renal calculi. Foley catheter in the bladder which is empty. Stomach/Bowel: Negative for bowel obstruction. No bowel mass or edema. Prior appendectomy. Vascular/Lymphatic: Atherosclerotic calcification aorta and iliac arteries without aneurysm. No lymphadenopathy Reproductive: Hysterectomy.  No pelvic mass. Other: Large retroperitoneal mass on the right compatible with hematoma which appears chronic. This is diffusely low density with surrounding stranding in the soft tissues. This is extraperitoneal. The hematoma measures 11 x 6.5 x 17 cm. Hematoma extends into the right psoas muscle. Small amount of  free fluid in the pelvis. Musculoskeletal: Moderate compression fracture T12 appears chronic. Mild compression fracture superior endplate of L3 appears chronic. No acute skeletal lesion. IMPRESSION: 1. Large retroperitoneal hematoma on the right extending into the pelvis and psoas muscle. This is likely the cause of anemia. The hematoma appears subacute to chronic. Note recent cerebral angiogram 09/01/2018 was via left femoral approach. 2. Small bilateral pleural effusions. 3. Atherosclerotic aorta without aneurysm. Electronically  Signed: By: Marlan Palau M.D. On: 09/06/2018 11:29   Ct Angio Head W Or Wo Contrast  Result Date: 08/31/2018 CLINICAL DATA:  Focal neuro deficit with stroke suspected.  Aphasia EXAM: CT ANGIOGRAPHY HEAD AND NECK TECHNIQUE: Multidetector CT imaging of the head and neck was performed using the standard protocol during bolus administration of intravenous contrast. Multiplanar CT image reconstructions and MIPs were obtained to evaluate the vascular anatomy. Carotid stenosis measurements (when applicable) are obtained utilizing NASCET criteria, using the distal internal carotid diameter as the denominator. CONTRAST:  75mL OMNIPAQUE IOHEXOL 350 MG/ML SOLN COMPARISON:  Head CT earlier today FINDINGS: CTA NECK FINDINGS Aortic arch: Atherosclerotic plaque. Partially visualized changes of CABG. 2 vessel branching. Right carotid system: Mild calcified plaque at the bifurcation without flow limiting stenosis or ulceration. Left carotid system: Bulky calcified plaque at the ICA bulb with severe stenosis due to irregular luminal plaque, pre occlusive in severity. The narrowing is so small that measurement may be misleading. There is symmetric downstream flow. Vertebral arteries: No proximal subclavian flow limiting stenosis. Dominant left vertebral artery. Skeleton: Degenerative changes without acute finding Other neck: Negative Upper chest: Central branching subsegmental pulmonary embolism in the right upper lobe, acute appearing Review of the MIP images confirms the above findings CTA HEAD FINDINGS Anterior circulation: Plaque on the carotid siphons. No emergent large vessel occlusion. Aplastic right A1 segment. Posterior circulation: Left dominant vertebral artery with calcified plaque on the V4 segment. The vertebral and basilar arteries are smooth and widely patent. Negative for aneurysm Venous sinuses: Patent Anatomic variants: As above Delayed phase: Not obtained in the emergent setting Critical Value/emergent results  were called by telephone at the time of interpretation on 08/31/2018 at 8:29 am to Dr. Arther Dames , who verbally acknowledged these results. Review of the MIP images confirms the above findings IMPRESSION: 1. No emergent large vessel occlusion. 2. Severe, pre occlusive atheromatous stenosis at the left ICA origin. This is presumably the source of left MCA territory infarcts on prior head CT. 3. Acute subsegmental pulmonary embolism in the right upper lobe. Electronically Signed   By: Marnee Spring M.D.   On: 08/31/2018 08:30   Ct Head Wo Contrast  Result Date: 09/06/2018 CLINICAL DATA:  Encephalopathy.  Stroke. EXAM: CT HEAD WITHOUT CONTRAST TECHNIQUE: Contiguous axial images were obtained from the base of the skull through the vertex without intravenous contrast. COMPARISON:  CT head 08/31/2018 FINDINGS: Brain: Cortical hypodensity in the left frontal convexity unchanged in extent from prior CT compatible with acute/subacute infarction. No associated hemorrhage Moderate atrophy. Extensive chronic microvascular ischemic change in the white matter. Small chronic infarcts in the cerebellum bilaterally. Vascular: Negative for hyperdense vessel Skull: Negative Sinuses/Orbits: Negative Other: None IMPRESSION: Cortical acute/subacute infarct in the high left frontal lobe is unchanged from the CT of 08/31/2018. Negative for hemorrhage. No new findings. Electronically Signed   By: Marlan Palau M.D.   On: 09/06/2018 09:51   Ct Angio Neck W Or Wo Contrast  Result Date: 08/31/2018 CLINICAL DATA:  Focal neuro deficit with stroke suspected.  Aphasia EXAM: CT ANGIOGRAPHY HEAD AND NECK TECHNIQUE: Multidetector CT imaging of the head and neck was performed using the standard protocol during bolus administration of intravenous contrast. Multiplanar CT image reconstructions and MIPs were obtained to evaluate the vascular anatomy. Carotid stenosis measurements (when applicable) are obtained utilizing NASCET criteria,  using the distal internal carotid diameter as the denominator. CONTRAST:  75mL OMNIPAQUE IOHEXOL 350 MG/ML SOLN COMPARISON:  Head CT earlier today FINDINGS: CTA NECK FINDINGS Aortic arch: Atherosclerotic plaque. Partially visualized changes of CABG. 2 vessel branching. Right carotid system: Mild calcified plaque at the bifurcation without flow limiting stenosis or ulceration. Left carotid system: Bulky calcified plaque at the ICA bulb with severe stenosis due to irregular luminal plaque, pre occlusive in severity. The narrowing is so small that measurement may be misleading. There is symmetric downstream flow. Vertebral arteries: No proximal subclavian flow limiting stenosis. Dominant left vertebral artery. Skeleton: Degenerative changes without acute finding Other neck: Negative Upper chest: Central branching subsegmental pulmonary embolism in the right upper lobe, acute appearing Review of the MIP images confirms the above findings CTA HEAD FINDINGS Anterior circulation: Plaque on the carotid siphons. No emergent large vessel occlusion. Aplastic right A1 segment. Posterior circulation: Left dominant vertebral artery with calcified plaque on the V4 segment. The vertebral and basilar arteries are smooth and widely patent. Negative for aneurysm Venous sinuses: Patent Anatomic variants: As above Delayed phase: Not obtained in the emergent setting Critical Value/emergent results were called by telephone at the time of interpretation on 08/31/2018 at 8:29 am to Dr. Arther Dames , who verbally acknowledged these results. Review of the MIP images confirms the above findings IMPRESSION: 1. No emergent large vessel occlusion. 2. Severe, pre occlusive atheromatous stenosis at the left ICA origin. This is presumably the source of left MCA territory infarcts on prior head CT. 3. Acute subsegmental pulmonary embolism in the right upper lobe. Electronically Signed   By: Marnee Spring M.D.   On: 08/31/2018 08:30   Mr Brain Wo  Contrast  Result Date: 09/01/2018 CLINICAL DATA:  Focal neuro deficit. EXAM: MRI HEAD WITHOUT CONTRAST TECHNIQUE: Multiplanar, multiecho pulse sequences of the brain and surrounding structures were obtained without intravenous contrast. COMPARISON:  Head CT and CTA from yesterday FINDINGS: Brain: Overall moderate area of patchy acute infarction along the left more than right high cerebral convexities which could be embolic or watershed. There is a pre-existing smaller area of infarct at the high left frontal parietal convexity. There is petechial hemorrhage associated with a left-sided infarction. Patchy mainly peripheral remote microhemorrhages, suspicious for amyloid angiopathy. No acute hematoma, hydrocephalus, or masslike finding. Confluent chronic small vessel ischemic gliosis in the periventricular white matter. Small remote bilateral cerebellar infarcts. Vascular: Major flow voids are preserved.  There was CTA yesterday Skull and upper cervical spine: Negative for marrow lesion Sinuses/Orbits: Bilateral cataract resection IMPRESSION: 1. Moderate area of acute infarction along the left more than right high cerebral convexity, likely watershed. Mild petechial hemorrhage on the left 2. Background of extensive chronic small vessel ischemia in the cerebral white matter. 3. Scattered remote micro hemorrhages suspicious for amyloid angiopathy. Electronically Signed   By: Marnee Spring M.D.   On: 09/01/2018 05:56   Mr Brain Wo Contrast  Result Date: 08/25/2018 CLINICAL DATA:  Transient slurred speech this afternoon. History of tick bite, AVM, diabetes, hypertension and hyperlipidemia. EXAM: MRI HEAD WITHOUT CONTRAST TECHNIQUE: Multiplanar, multiecho pulse sequences of the brain and surrounding structures were obtained without intravenous contrast. COMPARISON:  None.  FINDINGS: Mild motion degraded examination. INTRACRANIAL CONTENTS: No reduced diffusion to suggest acute ischemia a few scattered chronic  microhemorrhages noted. No parenchymal brain volume loss for age. No hydrocephalus. Old bilateral small cerebellar infarcts. Old LEFT thalamus and possible basal ganglia lacunar infarcts. Confluent supratentorial and patchy pontine white matter FLAIR T2 hyperintensities. Prominent basal ganglia perivascular spaces associated with chronic small vessel ischemic changes. Small bilateral frontal convexity encephalomalacia versus motion artifact. No suspicious parenchymal signal, masses, mass effect. No abnormal extra-axial fluid collections. No extra-axial masses. VASCULAR: Normal major intracranial vascular flow voids present at skull base. SKULL AND UPPER CERVICAL SPINE: No abnormal sellar expansion. No suspicious calvarial bone marrow signal. Craniocervical junction maintained. SINUSES/ORBITS: Included paranasal sinuses are well aerated. Minimal mastoid effusions. Included ocular globes and orbital contents are non-suspicious. Status post bilateral ocular lens implants. OTHER: None. IMPRESSION: 1. Mild motion degraded examination.  No acute intracranial process. 2. Moderate to severe chronic small vessel ischemic changes. Old cerebellar and deep gray nuclei infarcts. Old small frontal infarcts versus motion artifact. Electronically Signed   By: Awilda Metro M.D.   On: 08/25/2018 19:35   Dg Chest Port 1 View  Result Date: 09/02/2018 CLINICAL DATA:  Leukocytosis EXAM: PORTABLE CHEST 1 VIEW COMPARISON:  09/01/2018 FINDINGS: Cardiac shadow remains enlarged. Postsurgical changes are again. Aortic calcifications are again seen. The lungs are well aerated bilaterally. No focal infiltrate or sizable effusion is seen. No acute bony abnormality is noted. IMPRESSION: No active disease. Electronically Signed   By: Alcide Clever M.D.   On: 09/02/2018 09:12   Dg Chest Port 1 View  Result Date: 09/01/2018 CLINICAL DATA:  Patient with acute onset right side weakness and altered mental status. EXAM: PORTABLE CHEST 1 VIEW  COMPARISON:  Single-view of the chest 08/31/2018. PA and lateral chest 11/19/2013. FINDINGS: The patient is status post CABG and mitral valve repair. There is cardiomegaly and mild vascular congestion which appears improved since yesterday's exam. No consolidative process or pneumothorax. Trace right pleural effusion noted. Aortic atherosclerosis seen. No acute or focal bony abnormality IMPRESSION: Vascular congestion seen on yesterday's examination appears improved. Cardiomegaly. Trace right pleural effusion. Atherosclerosis. Electronically Signed   By: Drusilla Kanner M.D.   On: 09/01/2018 09:32   Dg Chest Port 1 View  Result Date: 08/31/2018 CLINICAL DATA:  Stroke with oral bleeding.  Question aspiration EXAM: PORTABLE CHEST 1 VIEW COMPARISON:  12/14/2013 FINDINGS: Interstitial coarsening without pulmonary edema or airspace disease in the apices on recent chest CTA. There is likely a vascular congestion. Cardiomegaly with mitral valve replacement and CABG. No effusion or pneumothorax. IMPRESSION: Cardiomegaly and vascular congestion. Electronically Signed   By: Marnee Spring M.D.   On: 08/31/2018 09:36   Ir Patient Eval Tech 0-60 Mins  Result Date: 08/31/2018 Norton Pastel     08/31/2018  9:08 AM Paged out as code stroke for IR intervention, Neurologist Aroor cancelled after tray and supplies were opened.  Ct Head Code Stroke Wo Contrast  Result Date: 08/31/2018 CLINICAL DATA:  Code stroke.  Aphasia. EXAM: CT HEAD WITHOUT CONTRAST TECHNIQUE: Contiguous axial images were obtained from the base of the skull through the vertex without intravenous contrast. COMPARISON:  Brain MRI 08/25/2018 FINDINGS: Brain: Cortical low-density along the high left frontal parietal convexity, increased in extent from prior brain MRI. No acute hemorrhage, hydrocephalus, or collection. Age congruent cerebral volume loss. Confluent chronic small vessel ischemic change in the cerebral white matter. Small remote bilateral  cerebellar infarcts. Vascular: Atherosclerotic calcification.  No  hyperdense vessel. Skull: Negative Sinuses/Orbits: Bilateral cataract resection Other: These results were communicated to Dr. Laurence Slate at 7:56 amon 4/6/2020by text page via the [facility] messaging system. ASPECTS [facility] Stroke Program Early CT Score) - Ganglionic level infarction (caudate, lentiform nuclei, internal capsule, insula, M1-M3 cortex): 7 - Supraganglionic infarction (M4-M6 cortex): 1 -2 Total score (0-10 with 10 being normal): 8 to 9 IMPRESSION: 1. Acute on chronic high left cerebral infarcions, ASCPETS is 8 to 9. 2. No acute hemorrhage. Electronically Signed   By: Marnee Spring M.D.   On: 08/31/2018 07:58   Vas [place] Lower Extremity Venous (dvt) (only [facility])  Result Date: 08/30/2018  Lower Venous Study Indications: Edema, and Multiple tick bites.  Comparison Study: No prior study on file for comparison Performing Technologist: Sherren Kerns RVS  Examination Guidelines: A complete evaluation includes B-mode imaging, spectral Doppler, color Doppler, and power Doppler as needed of all accessible portions of each vessel. Bilateral testing is considered an integral part of a complete examination. Limited examinations for reoccurring indications may be performed as noted.  Right Venous Findings: +---+---------------+---------+-----------+----------+-------+    CompressibilityPhasicitySpontaneityPropertiesSummary +---+---------------+---------+-----------+----------+-------+ CFVFull           Yes      Yes                          +---+---------------+---------+-----------+----------+-------+  Left Venous Findings: +---------+---------------+---------+-----------+----------+-------+          CompressibilityPhasicitySpontaneityPropertiesSummary +---------+---------------+---------+-----------+----------+-------+ CFV      Full           Yes      Yes                           +---------+---------------+---------+-----------+----------+-------+ SFJ      Full                                                 +---------+---------------+---------+-----------+----------+-------+ FV Prox  Full                                                 +---------+---------------+---------+-----------+----------+-------+ FV Mid   Full                                                 +---------+---------------+---------+-----------+----------+-------+ FV DistalFull                                                 +---------+---------------+---------+-----------+----------+-------+ PFV      Full                                                 +---------+---------------+---------+-----------+----------+-------+ POP      Full           Yes      Yes                          +---------+---------------+---------+-----------+----------+-------+  PTV      Full                                                 +---------+---------------+---------+-----------+----------+-------+ PERO     Full                                                 +---------+---------------+---------+-----------+----------+-------+ GSV      Full                                                 +---------+---------------+---------+-----------+----------+-------+    Summary: Right: No evidence of common femoral vein obstruction. Left: No evidence of common femoral vein obstruction. Ultrasound characteristics of enlarged lymph nodes noted in the groin.  *See table(s) above for measurements and observations. Electronically signed by Sherald Hess MD on 08/30/2018 at 9:06:31 AM.    Final    Ir Angio Intra Extracran Sel Com Carotid Innominate Bilat Mod Sed  Result Date: 09/04/2018 CLINICAL DATA:  Aphasia with right-sided weakness due to left cerebral hemispheric watershed strokes. Severe left internal carotid artery stenosis proximally by CTA of the head and neck. EXAM: IR ANGIO  VERTEBRAL SEL VERTEBRAL UNI LEFT MOD SED; BILATERAL COMMON CAROTID AND INNOMINATE ANGIOGRAPHY; IR ANGIO VERTEBRAL SEL SUBCLAVIAN INNOMINATE UNI RIGHT MOD SED COMPARISON:  CT angiogram of the head and neck of 08/31/2018. MEDICATIONS: Heparin 1000 units IV; no antibiotic was administered within 1 hour of the procedure. ANESTHESIA/SEDATION: Versed 1 mg IV; Fentanyl 25 mcg IV Moderate Sedation Time:  25 minutes The patient was continuously monitored during the procedure by the interventional radiology nurse under my direct supervision. CONTRAST:  Isovue 300 approximately 60 mL. FLUOROSCOPY TIME:  Fluoroscopy Time: 7 minutes 48 seconds (529 mGy). COMPLICATIONS: None immediate. TECHNIQUE: Informed written consent was obtained from the patient son after a thorough discussion of the procedural risks, benefits and alternatives. All questions were addressed. Maximal Sterile Barrier Technique was utilized including caps, mask, sterile gowns, sterile gloves, sterile drape, hand hygiene and skin antiseptic. A timeout was performed prior to the initiation of the procedure. The left groin was prepped and draped in the usual sterile fashion. Thereafter using modified Seldinger technique, transfemoral access into the left common femoral artery was obtained without difficulty. Over a 0.035 inch guidewire, a 5 French Pinnacle sheath was inserted. Through this, and also over 0.035 inch guidewire, a 5 [place] JB 1 catheter was advanced to the aortic arch region and selectively positioned in the right common carotid artery, the innominate artery, the left common carotid artery and the left vertebral artery. FINDINGS: The left common carotid arteriogram demonstrates atherosclerotic disease involving the left common carotid bifurcation. Severe stenosis is seen at the origin of the left external carotid artery. Its branches, however, opacify. The left internal carotid artery at the bulb demonstrates a circumferential plaque without  ulcerations associated with severe 90% plus stenosis of the left internal carotid artery. No evidence of acute ulcerations or of intraluminal filling defects is seen. The vessel is seen to opacify to the cranial skull base. The petrous, cavernous and supraclinoid segments  are widely patent. The left middle cerebral artery and the left anterior cerebral artery opacify into the capillary and venous phases. The venous phase demonstrates a hypoplastic left transverse sinus in its proximal 2/3. There is opacification of the left cerebral hemisphere via the sphenoparietal sinus into the cavernous sinus and subsequently through the inferior petrosal sinus into the left internal jugular vein. Cross-filling via the anterior communicating artery of the left anterior cerebral A2 segment and distally is seen. The left vertebral artery origin is widely patent. The vessel is seen to opacify to the cranial skull base. Wide patency is seen of the left vertebrobasilar junction and the left posterior-inferior cerebellar artery. The basilar artery, the posterior cerebral arteries, the superior cerebellar arteries and the anterior-inferior cerebral arteries opacify into the capillary and venous phases. Retrograde opacification via the left posterior communicating artery is seen of the left anterior cerebral artery distribution. Venous phase demonstrates patency of the left transverse sinus with an accessory vein running parallel to the internal jugular vein from the level of the jugular bulb to become confluent with the distal 1/3 of the internal jugular vein at the cranial skull base. The innominate artery injection demonstrates tortuosity of the innominate artery, and also the proximal right subclavian artery and the proximal 1/3 of the right common carotid artery. A hypoplastic right vertebral artery origin is widely patent. The vertebral artery is seen to opacify to the cranial skull base where it opacifies the right  vertebrobasilar junction. The right common carotid arteriogram demonstrates mild stenosis of the origin of the right external carotid artery. Opacification of its branches is seen. The right internal carotid artery at the bulb to the cranial skull base demonstrates wide patency. The petrous, cavernous and the supraclinoid segments are widely patent with mild atherosclerotic disease involving the proximal supraclinoid segment. The right middle cerebral artery is seen to opacify into the capillary and venous phases. Nonvisualization of the right anterior cerebral A1 segment is seen. IMPRESSION: Severe high-grade symptomatic 90% plus stenosis of the left internal carotid artery at the bulb. Nonvisualization of the right anterior cerebral A1 segment, with partial retrograde reconstitution via the right posterior communicating artery. Moderate non occlusive atherosclerotic disease of the left subclavian artery. PLAN: At the end of procedure, the patient's distal pulses in the left foot were palpable unchanged, with the right sided dorsalis pedis, and posterior tibial pulses remaining Dopplerable, and unchanged. Findings reviewed with the patient's referring neurologist. Electronically Signed   By: Julieanne Cotton M.D.   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 Severe left internal carotid artery stenosis proximally by CTA of the head and neck. EXAM: IR ANGIO VERTEBRAL SEL VERTEBRAL UNI LEFT MOD SED; BILATERAL COMMON CAROTID AND INNOMINATE ANGIOGRAPHY; IR ANGIO VERTEBRAL SEL SUBCLAVIAN INNOMINATE UNI RIGHT MOD SED COMPARISON:  CT angiogram of the head and neck of 08/31/2018. MEDICATIONS: Heparin 1000 units IV; no antibiotic was administered within 1 hour of the procedure. ANESTHESIA/SEDATION: Versed 1 mg IV; Fentanyl 25 mcg IV Moderate Sedation Time:   25 minutes The patient was continuously monitored during the procedure by the interventional radiology nurse under my direct supervision. CONTRAST:  Isovue 300 approximately 60 mL. FLUOROSCOPY TIME:  Fluoroscopy Time: 7 minutes 48 seconds (529 mGy). COMPLICATIONS: None immediate. TECHNIQUE: Informed written consent was obtained from the patient son after a thorough discussion of the procedural  risks, benefits and alternatives. All questions were addressed. Maximal Sterile Barrier Technique was utilized including caps, mask, sterile gowns, sterile gloves, sterile drape, hand hygiene and skin antiseptic. A timeout was performed prior to the initiation of the procedure. The left groin was prepped and draped in the usual sterile fashion. Thereafter using modified Seldinger technique, transfemoral access into the left common femoral artery was obtained without difficulty. Over a 0.035 inch guidewire, a 5 French Pinnacle sheath was inserted. Through this, and also over 0.035 inch guidewire, a 5 [place] JB 1 catheter was advanced to the aortic arch region and selectively positioned in the right common carotid artery, the innominate artery, the left common carotid artery and the left vertebral artery. FINDINGS: The left common carotid arteriogram demonstrates atherosclerotic disease involving the left common carotid bifurcation. Severe stenosis is seen at the origin of the left external carotid artery. Its branches, however, opacify. The left internal carotid artery at the bulb demonstrates a circumferential plaque without ulcerations associated with severe 90% plus stenosis of the left internal carotid artery. No evidence of acute ulcerations or of intraluminal filling defects is seen. The vessel is seen to opacify to the cranial skull base. The petrous, cavernous and supraclinoid segments are widely patent. The left middle cerebral artery and the left anterior cerebral artery opacify into the capillary and venous phases.  The venous phase demonstrates a hypoplastic left transverse sinus in its proximal 2/3. There is opacification of the left cerebral hemisphere via the sphenoparietal sinus into the cavernous sinus and subsequently through the inferior petrosal sinus into the left internal jugular vein. Cross-filling via the anterior communicating artery of the left anterior cerebral A2 segment and distally is seen. The left vertebral artery origin is widely patent. The vessel is seen to opacify to the cranial skull base. Wide patency is seen of the left vertebrobasilar junction and the left posterior-inferior cerebellar artery. The basilar artery, the posterior cerebral arteries, the superior cerebellar arteries and the anterior-inferior cerebral arteries opacify into the capillary and venous phases. Retrograde opacification via the left posterior communicating artery is seen of the left anterior cerebral artery distribution. Venous phase demonstrates patency of the left transverse sinus with an accessory vein running parallel to the internal jugular vein from the level of the jugular bulb to become confluent with the distal 1/3 of the internal jugular vein at the cranial skull base. The innominate artery injection demonstrates tortuosity of the innominate artery, and also the proximal right subclavian artery and the proximal 1/3 of the right common carotid artery. A hypoplastic right vertebral artery origin is widely patent. The vertebral artery is seen to opacify to the cranial skull base where it opacifies the right vertebrobasilar junction. The right common carotid arteriogram demonstrates mild stenosis of the origin of the right external carotid artery. Opacification of its branches is seen. The right internal carotid artery at the bulb to the cranial skull base demonstrates wide patency. The petrous, cavernous and the supraclinoid segments are widely patent with mild atherosclerotic disease involving the proximal supraclinoid  segment. The right middle cerebral artery is seen to opacify into the capillary and venous phases. Nonvisualization of the right anterior cerebral A1 segment is seen. IMPRESSION: Severe high-grade symptomatic 90% plus stenosis of the left internal carotid artery at the bulb. Nonvisualization of the right anterior cerebral A1 segment, with partial retrograde reconstitution via the right posterior communicating artery. Moderate non occlusive atherosclerotic disease of the left subclavian artery. PLAN: At the end of procedure, the patient's  distal pulses in the left foot were palpable unchanged, with the right sided dorsalis pedis, and posterior tibial pulses remaining Dopplerable, and unchanged. Findings reviewed with the patient's referring neurologist. Electronically Signed   By: Julieanne Cotton M.D.   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 Severe left internal carotid artery stenosis proximally by CTA of the head and neck. EXAM: IR ANGIO VERTEBRAL SEL VERTEBRAL UNI LEFT MOD SED; BILATERAL COMMON CAROTID AND INNOMINATE ANGIOGRAPHY; IR ANGIO VERTEBRAL SEL SUBCLAVIAN INNOMINATE UNI RIGHT MOD SED COMPARISON:  CT angiogram of the head and neck of 08/31/2018. MEDICATIONS: Heparin 1000 units IV; no antibiotic was administered within 1 hour of the procedure. ANESTHESIA/SEDATION: Versed 1 mg IV; Fentanyl 25 mcg IV Moderate Sedation Time:  25 minutes The patient was continuously monitored during the procedure by the interventional radiology nurse under my direct supervision. CONTRAST:  Isovue 300 approximately 60 mL. FLUOROSCOPY TIME:  Fluoroscopy Time: 7 minutes 48 seconds (529 mGy). COMPLICATIONS: None immediate. TECHNIQUE: Informed written consent was obtained from the patient son after a thorough discussion of the procedural risks, benefits and alternatives.  All questions were addressed. Maximal Sterile Barrier Technique was utilized including caps, mask, sterile gowns, sterile gloves, sterile drape, hand hygiene and skin antiseptic. A timeout was performed prior to the initiation of the procedure. The left groin was prepped and draped in the usual sterile fashion. Thereafter using modified Seldinger technique, transfemoral access into the left common femoral artery was obtained without difficulty. Over a 0.035 inch guidewire, a 5 French Pinnacle sheath was inserted. Through this, and also over 0.035 inch guidewire, a 5 [place] JB 1 catheter was advanced to the aortic arch region and selectively positioned in the right common carotid artery, the innominate artery, the left common carotid artery and the left vertebral artery. FINDINGS: The left common carotid arteriogram demonstrates atherosclerotic disease involving the left common carotid bifurcation. Severe stenosis is seen at the origin of the left external carotid artery. Its branches, however, opacify. The left internal carotid artery at the bulb demonstrates a circumferential plaque without ulcerations associated with severe 90% plus stenosis of the left internal carotid artery. No evidence of acute ulcerations or of intraluminal filling defects is seen. The vessel is seen to opacify to the cranial skull base. The petrous, cavernous and supraclinoid segments are widely patent. The left middle cerebral artery and the left anterior cerebral artery opacify into the capillary and venous phases. The venous phase demonstrates a hypoplastic left transverse sinus in its proximal 2/3. There is opacification of the left cerebral hemisphere via the sphenoparietal sinus into the cavernous sinus and subsequently through the inferior petrosal sinus into the left internal jugular vein. Cross-filling via the anterior communicating artery of the left anterior cerebral A2 segment and distally is seen. The left vertebral artery  origin is widely patent. The vessel is seen to opacify to the cranial skull base. Wide patency is seen of the left vertebrobasilar junction and the left posterior-inferior cerebellar artery. The basilar artery, the posterior cerebral arteries, the superior cerebellar arteries and the anterior-inferior cerebral arteries opacify into the capillary and venous phases. Retrograde opacification via the left posterior communicating artery is seen of the left anterior cerebral artery distribution. Venous phase demonstrates patency of the left transverse sinus with an accessory vein running parallel to the internal jugular vein from the level of the  jugular bulb to become confluent with the distal 1/3 of the internal jugular vein at the cranial skull base. The innominate artery injection demonstrates tortuosity of the innominate artery, and also the proximal right subclavian artery and the proximal 1/3 of the right common carotid artery. A hypoplastic right vertebral artery origin is widely patent. The vertebral artery is seen to opacify to the cranial skull base where it opacifies the right vertebrobasilar junction. The right common carotid arteriogram demonstrates mild stenosis of the origin of the right external carotid artery. Opacification of its branches is seen. The right internal carotid artery at the bulb to the cranial skull base demonstrates wide patency. The petrous, cavernous and the supraclinoid segments are widely patent with mild atherosclerotic disease involving the proximal supraclinoid segment. The right middle cerebral artery is seen to opacify into the capillary and venous phases. Nonvisualization of the right anterior cerebral A1 segment is seen. IMPRESSION: Severe high-grade symptomatic 90% plus stenosis of the left internal carotid artery at the bulb. Nonvisualization of the right anterior cerebral A1 segment, with partial retrograde reconstitution via the right posterior communicating artery.  Moderate non occlusive atherosclerotic disease of the left subclavian artery. PLAN: At the end of procedure, the patient's distal pulses in the left foot were palpable unchanged, with the right sided dorsalis pedis, and posterior tibial pulses remaining Dopplerable, and unchanged. Findings reviewed with the patient's referring neurologist. Electronically Signed   By: Julieanne Cotton M.D.   On: 09/01/2018 12:53    Microbiology: Recent Results (from the past 240 hour(s))  MRSA PCR Screening     Status: None   Collection Time: 08/31/18  8:59 AM  Result Value Ref Range Status   MRSA by PCR NEGATIVE NEGATIVE Final    Comment:        The GeneXpert MRSA Assay (FDA approved for NASAL specimens only), is one component of a comprehensive MRSA colonization surveillance program. It is not intended to diagnose MRSA infection nor to guide or monitor treatment for MRSA infections. Performed at [facility], [address], [place], [place] [address]      Labs: Basic Metabolic Panel: Recent Labs  Lab 09/02/18 0240 09/03/18 0453 09/04/18 0333 09/05/18 0452 09/06/18 0512  NA 138 140 141 142 140  K 3.9 3.3* 3.4* 4.1 4.7  CL 109 111 115* 112* 110  CO2 20* 20* 18* 20* 18*  GLUCOSE 150* 129* 130* 128* 207*  BUN 16 32* 29* 22 33*  CREATININE 0.98 0.93 0.99 0.97 1.79*  CALCIUM 9.0 8.7* 8.3* 8.9 8.6*   Liver Function Tests: No results for input(s): AST, ALT, ALKPHOS, BILITOT, PROT, ALBUMIN in the last 168 hours. No results for input(s): LIPASE, AMYLASE in the last 168 hours. No results for input(s): AMMONIA in the last 168 hours. CBC: Recent Labs  Lab 09/03/18 0453 09/03/18 0739 09/03/18 1615 09/04/18 0333 09/04/18 1842 09/05/18 0452 09/06/18 0512  WBC 10.6* 10.0  --  9.7 12.0* 11.2* 21.3*  NEUTROABS 7.0  --   --   --   --   --   --   HGB 7.5* 7.1* 9.2* 7.8* 8.7* 8.8* 6.1*  HCT 23.2* 22.2* 27.7* 24.2* 26.8* 27.0* 18.3*  MCV 102.2* 102.3*  --  98.8 97.5 97.5 100.5*  PLT 244  230  --  203 237 227 239   Cardiac Enzymes: No results for input(s): CKTOTAL, CKMB, CKMBINDEX, TROPONINI in the last 168 hours. BNP: BNP (last 3 results) No results for input(s): BNP in the last  8760 hours.  ProBNP (last 3 results) No results for input(s): PROBNP in the last 8760 hours.  CBG: Recent Labs  Lab 09/05/18 1637 09/05/18 2121 09/06/18 0602 09/06/18 0759 09/06/18 1143  GLUCAP 180* 216* 183* 185* 162*       Signed:  Zannie CovePreetha Auden Wettstein MD.  [facility] 09/08/2018, 4:07 PM

## 2018-09-08 NOTE — Progress Notes (Signed)
[facility])   Received request from CSW [name] for patient/family request for [facility] services at home after discharge. Chart and patient information reviewed and eligibility has been confirmed by [facility] physician.   Spoke with son Emily Daniel by phone to initiate education related to hospice philosophy, services and team approach to care. Emily Daniel verbalized understanding of information discussed. Per discussion, plan is for discharge to home by [facility] pending DME delivery.  Please send signed and completed DNR form home with patient/family.   Patient will need prescriptions for discharge comfort medications.  DME needs discussed. Robert requested hospital bed, half rails, OBT and BSC.  [facility] DME specialist has been notified and will contact [employer] to arrange delivery to the home. Home address has been verified and is correct in the chart.  Emily Daniel is the family member to contact to arrange time of delivery [phone].  [facility] aware of the above.  Please fax discharge summary to [facility] at [phone] when final. Please notify [facility] when patient is ready to leave the unit at discharge. (Call [phone] between 8:30 and 5pm. Call [phone] after 5pm.)  [facility] information and contact numbers given to Emily Daniel during this phone call.  Above information shared with CSW [name].  Please call with hospice related questions.  Thank you for this referral.  Cala Bradford [facility] Liaison [phone]   [facility] Liaisons are listed on AMION under [facility] [place].

## 2018-09-08 NOTE — TOC Progression Note (Signed)
Transition of Care [facility]) - Progression Note    Patient Details  Name: Emily Daniel MRN: [id number] Date of Birth: March 25, 1930  Transition of Care [facility]) CM/SW Contact  Baldemar Lenis, [place] Phone Number: 09/08/2018, 3:09 PM  Clinical Narrative:   CSW alerted that equipment is delayed, will not be delivered until late tonight. Patient will be transferred home tomorrow morning. MD aware of barrier to discharge.    Expected Discharge Plan: Home w Hospice Care Barriers to Discharge: Continued Medical Work up, English as a second language teacher  Expected Discharge Plan and Services Expected Discharge Plan: Home w Hospice Care     Post Acute Care Choice: Skilled Nursing Facility Living arrangements for the past 2 months: Single Family Home Expected Discharge Date: 09/08/18                   HH Arranged: PT HH Agency: [facility]   Social Determinants of Health (SDOH) Interventions    Readmission Risk Interventions No flowsheet data found.

## 2018-09-08 NOTE — Telephone Encounter (Signed)
Copied from CRM [id number]. Topic: Quick Communication - Home Health Verbal Orders >> Sep 08, 2018  1:08 PM Jay Schlichter wrote: Caller/Agency: Joice Lofts - [facility]  Callback Number: [phone] Requesting OT/PT/Skilled Nursing/Social Work/Speech Therapy:  Frequency: [facility]  called to make sure Dr Laury Axon wil be attending docotr for pt.

## 2018-09-08 NOTE — Telephone Encounter (Signed)
Verbal orders given  

## 2018-09-08 NOTE — Progress Notes (Signed)
Patient ID: KENYON MENGES, female   DOB: [birth year], 85 y.o.   MRN: [id number]  This NP visited patient at the bedside as a follow up for palliative medicine needs and emotional support for family.  Patient is more lethargic today but still  arouses to stimulus, she is nonverbal, unable to follow commands and with little p.o. intake, skin is warm and dry, no mottleing.   Spoke  to son/Robert for continued conversation regarding current medical situation.  Family remains comfortable with their decision to focus on comfort and dignity at this known end-of-life period of time.  Decision today is to take [facility] home with hospice services.  Will write for choice.   Symptom management:  - Roxanol 5 mg po/sl every 6 hrs  for underlying  generalized discomfort and every 1 hours prn for pain or dyspnea -Ativan 1 mg po/sl every 4 hrs prn  Discussed  the natural trajectory and expectations at end of life.  Questions and concerns addressed.    Total time spent on the unit was 35 minutes   Discussed with Liz/LCSW  Greater than 50% of the time was spent in counseling and coordination of care  Lorinda Creed NP  Palliative Medicine Team Team Phone # [phone] Pager [phone]

## 2018-09-08 NOTE — Consult Note (Signed)
   [facility] Inpatient Consult   09/08/2018  ADALIZ LARAIA 1929/06/10 [id number]    Patient's chart reviewed for possible [facility] Care Management service needs as benefit from [employer].Patient had previous outreach from [facility] pharmacy, but currently inactive.   Per chart review and MD notes 09/08/18 shows as follows: Patient is an 85 year old female with history of hypertension, hyperlipidemia, diabetes, CAD status post CABG and mitral valve repair for MR, chronic systolic CHF, peripheral vascular disease, Atrial fibrillation not on anticoagulation at baseline due to GI bleed/ AVMs, old frontal infarctions on MRI. She presented to the ED on April 4 with confusion, initially attributed to UTI/ left lower extremity cellulitis; was found to have left MCA stroke secondary to severe, pre-occlusive atheromatous stenosis. MD note indicates discussion with son on patient's worsening condition and poor prognosis. Son decided to make patient DNR and on comfort measures.   Transition of care social work and palliative care note reviewed stating that patient's expected discharge plan is home with hospice care (pending DME delivery) for end of life care. Patient's sons prefer [facility].  No identified needs from [facility] Care Management at this point.   Will sign off.   For questions and additional information, please contact:  Allean Montfort A. Khiara Shuping, BSN, RN-BC [facility] Liaison Cell: [phone]

## 2018-09-14 ENCOUNTER — Telehealth: Payer: Self-pay | Admitting: *Deleted

## 2018-09-14 NOTE — Telephone Encounter (Signed)
Received Certificate of Death for Oct 09, 2018 date of death from [facility], completed as much as possible; will forward to provider upon RTO 09/15/18/SLS 04/20

## 2018-09-15 NOTE — Telephone Encounter (Signed)
Informed COD ready for p/u during regular business hours; understood & agreed; sent message to Mickie Kay to change pt's chart to deceased/SLS Oct 04, 2022

## 2018-09-17 ENCOUNTER — Encounter: Payer: Self-pay | Admitting: Family Medicine

## 2018-09-25 DEATH — deceased

## 2018-10-08 NOTE — Telephone Encounter (Signed)
[facility] called to request information on a plan of care order that was faxed over to Dr Laury Axon on 09/09/2018

## 2018-10-08 NOTE — Telephone Encounter (Signed)
Called and hospice will fax over another plan of care

## 2018-10-12 ENCOUNTER — Telehealth: Payer: Self-pay

## 2018-10-12 NOTE — Telephone Encounter (Signed)
Received paperwork and will fax when signed.  Dr. Laury Axon missed signing a page.

## 2018-10-12 NOTE — Telephone Encounter (Signed)
[facility] calling to check on status of orders that have been faxed over twice. Needing this ASAP.

## 2018-10-13 NOTE — Telephone Encounter (Signed)
Paperwork faxed back over

## 2019-01-21 ENCOUNTER — Ambulatory Visit: Payer: Self-pay | Admitting: Family Medicine

## 2019-04-15 ENCOUNTER — Encounter: Payer: Self-pay | Admitting: Family Medicine

## 2019-10-13 IMAGING — CT CT ABDOMEN AND PELVIS WITHOUT CONTRAST
2 of 4 series · 14 of 46 positions shown, 16 images · non-contrast
Comparison: CT abdomen pelvis 03/29/2009
COMPARISON: CT abdomen pelvis 03/29/2009

Addendum:
CLINICAL DATA: Recurring anemia.

EXAM:
CT ABDOMEN AND PELVIS WITHOUT CONTRAST
TECHNIQUE: Multidetector CT imaging of the abdomen and pelvis was performed
following the standard protocol without IV contrast.

[Series 4: a/p w/o 5mm · axial · non-contrast · 0.62mm/px · z∈[-928,-558]mm · 11 of 88 slices shown, 13 images]
[im 7/88  soft-tissue]
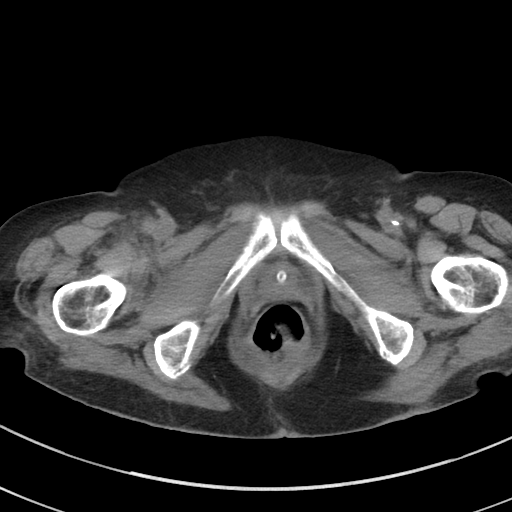
[im 7/88  bone]
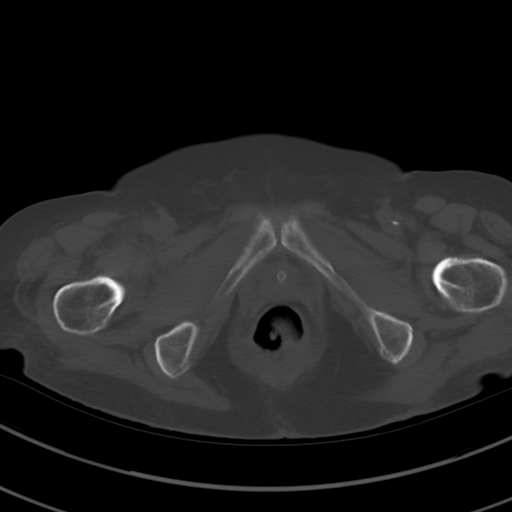
[im 14/88  soft-tissue]
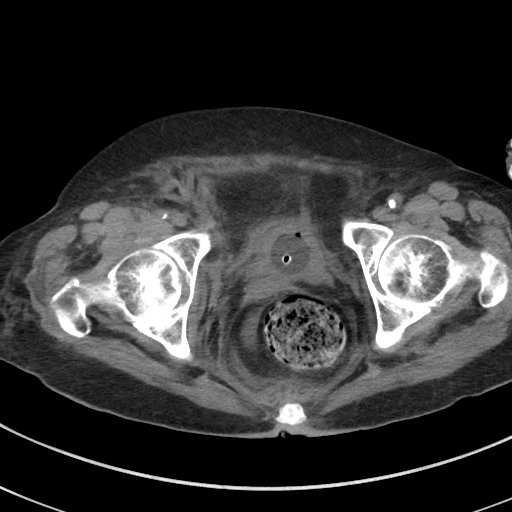
[im 21/88  soft-tissue]
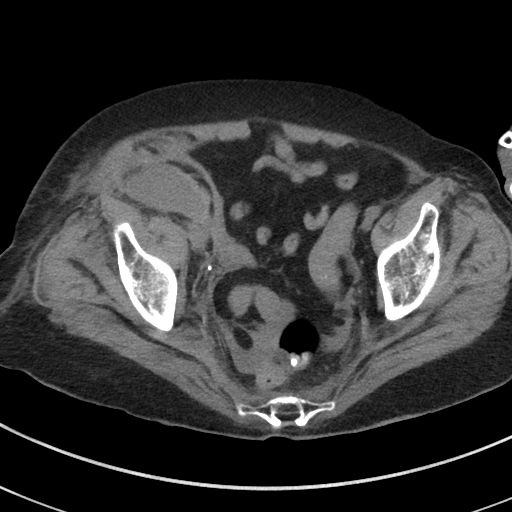
[im 28/88  soft-tissue]
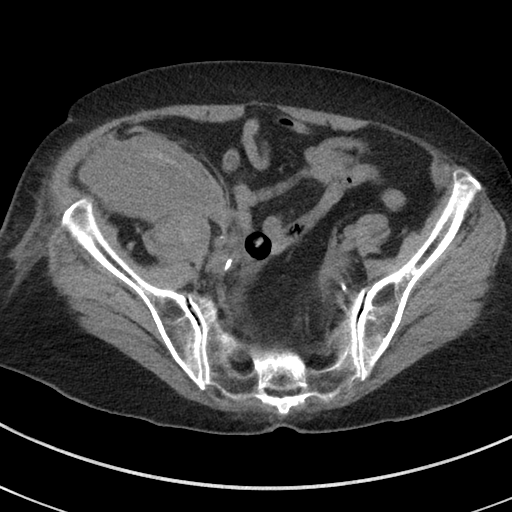
[im 35/88  soft-tissue]
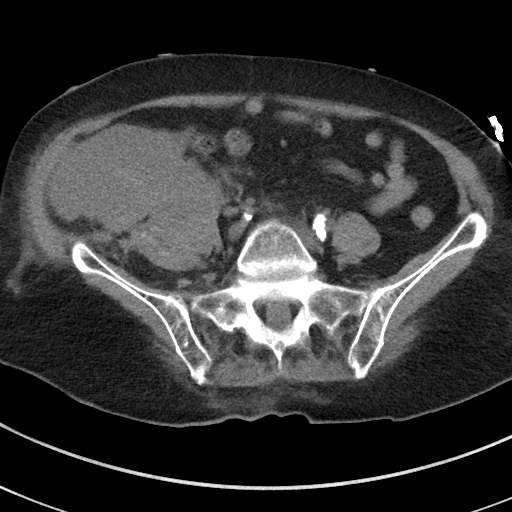
[im 46/88  soft-tissue]
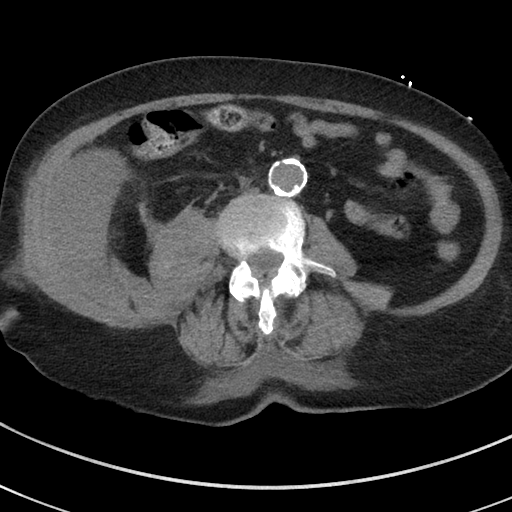
[im 53/88  soft-tissue]
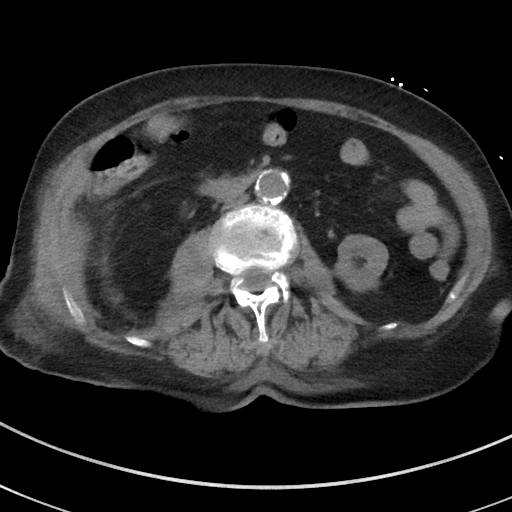
[im 60/88  soft-tissue]
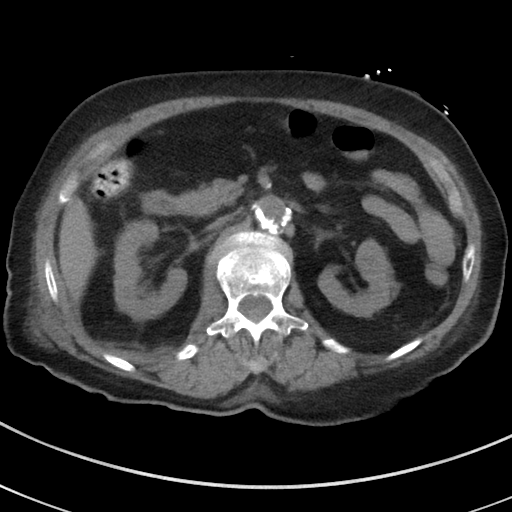
[im 67/88  soft-tissue]
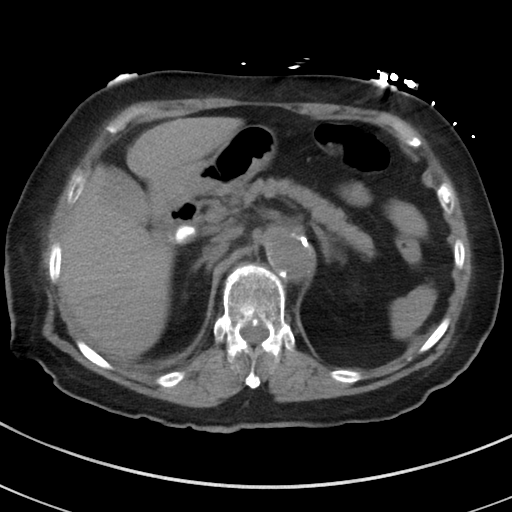
[im 67/88  bone]
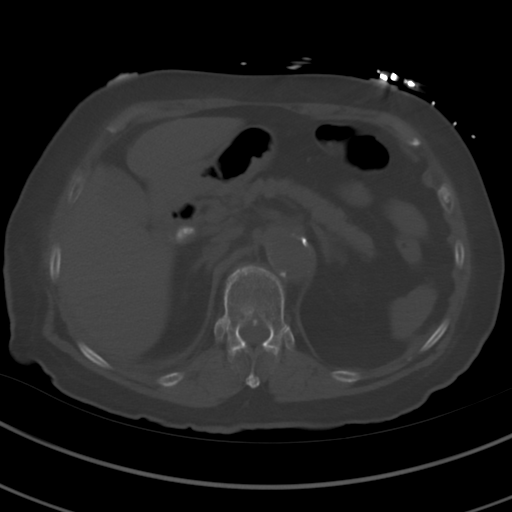
[im 74/88  soft-tissue]
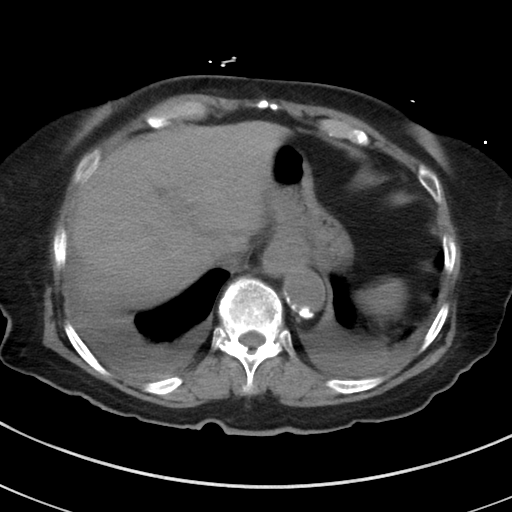
[im 81/88  soft-tissue]
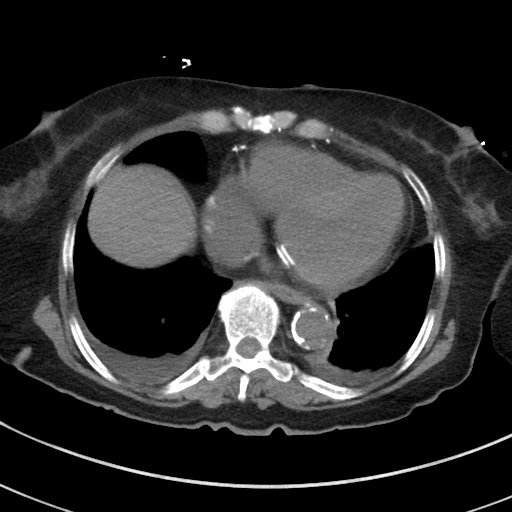

[Series 7: a/p w/o cor · coronal · non-contrast · 0.64mm/px · 3 of 151 slices shown]
[im 51/151  soft-tissue]
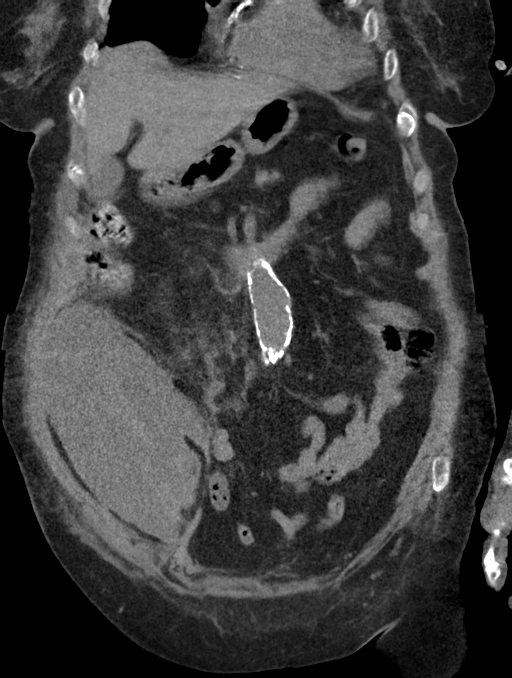
[im 67/151  soft-tissue]
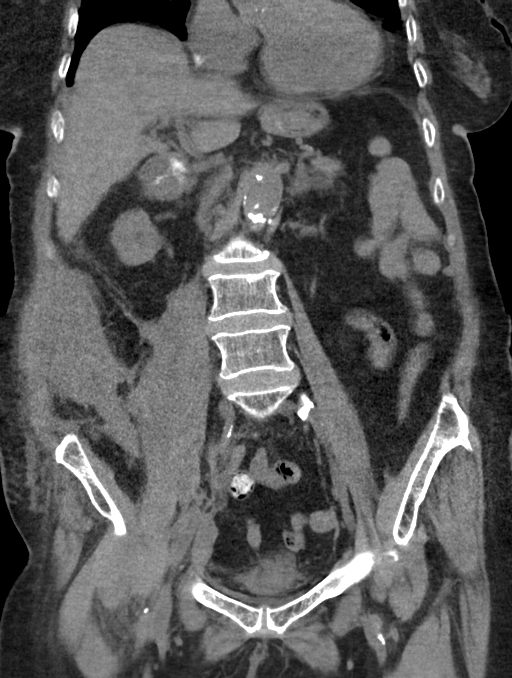
[im 84/151  soft-tissue]
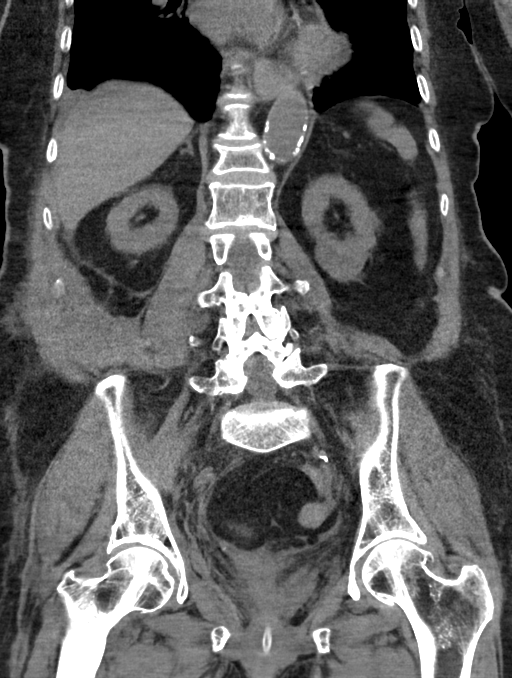

[14 of 46 positions shown; findings below may reference images not displayed]

FINDINGS: Lower chest: Small pleural effusions bilaterally. Mild cardiac
enlargement. Atherosclerotic calcification in the thoracic aorta. No
infiltrate in the lung bases.

Hepatobiliary: No focal liver abnormality is seen. No gallstones,
gallbladder wall thickening, or biliary dilatation.

Pancreas: Pancreatic atrophy.  Negative for mass or edema

Spleen: Small spleen without focal abnormality

Adrenals/Urinary Tract: Normal kidneys. No renal mass or
obstruction. No renal calculi. Foley catheter in the bladder which
is empty.

Stomach/Bowel: Negative for bowel obstruction. No bowel mass or
edema. Prior appendectomy.

Vascular/Lymphatic: Atherosclerotic calcification aorta and iliac
arteries without aneurysm. No lymphadenopathy

Reproductive: Hysterectomy.  No pelvic mass.

Other: Large retroperitoneal mass on the right compatible with
hematoma which appears chronic. This is diffusely low density with
surrounding stranding in the soft tissues. This is extraperitoneal.
The hematoma measures 11 x 6.5 x 17 cm. Hematoma extends into the
right psoas muscle.

Small amount of free fluid in the pelvis.

Musculoskeletal: Moderate compression fracture T12 appears chronic.
Mild compression fracture superior endplate of L3 appears chronic.
No acute skeletal lesion.
IMPRESSION: 1. Large retroperitoneal hematoma on the right extending into the
pelvis and psoas muscle. This is likely the cause of anemia. The
hematoma appears subacute to chronic. Note recent cerebral angiogram
09/01/2018 was via left femoral approach.
2. Small bilateral pleural effusions.
3. Atherosclerotic aorta without aneurysm.

ADDENDUM:
These results were called by telephone at the time of interpretation
on 09/06/2018 at [DATE] to Dr. BAMBUCAFE TARLA , who verbally
acknowledged these results.

*** End of Addendum ***
FINDINGS: Lower chest: Small pleural effusions bilaterally. Mild cardiac
enlargement. Atherosclerotic calcification in the thoracic aorta. No
infiltrate in the lung bases.

Hepatobiliary: No focal liver abnormality is seen. No gallstones,
gallbladder wall thickening, or biliary dilatation.

Pancreas: Pancreatic atrophy.  Negative for mass or edema

Spleen: Small spleen without focal abnormality

Adrenals/Urinary Tract: Normal kidneys. No renal mass or
obstruction. No renal calculi. Foley catheter in the bladder which
is empty.

Stomach/Bowel: Negative for bowel obstruction. No bowel mass or
edema. Prior appendectomy.

Vascular/Lymphatic: Atherosclerotic calcification aorta and iliac
arteries without aneurysm. No lymphadenopathy

Reproductive: Hysterectomy.  No pelvic mass.

Other: Large retroperitoneal mass on the right compatible with
hematoma which appears chronic. This is diffusely low density with
surrounding stranding in the soft tissues. This is extraperitoneal.
The hematoma measures 11 x 6.5 x 17 cm. Hematoma extends into the
right psoas muscle.

Small amount of free fluid in the pelvis.

Musculoskeletal: Moderate compression fracture T12 appears chronic.
Mild compression fracture superior endplate of L3 appears chronic.
No acute skeletal lesion.
IMPRESSION: 1. Large retroperitoneal hematoma on the right extending into the
pelvis and psoas muscle. This is likely the cause of anemia. The
hematoma appears subacute to chronic. Note recent cerebral angiogram
09/01/2018 was via left femoral approach.
2. Small bilateral pleural effusions.
3. Atherosclerotic aorta without aneurysm.
